# Patient Record
Sex: Male | Born: 1950
Health system: Southern US, Community
[De-identification: ages and names within clinical notes are randomized; demographics above are authoritative.]

## PROBLEM LIST (undated history)

## (undated) DIAGNOSIS — L089 Local infection of the skin and subcutaneous tissue, unspecified: Secondary | ICD-10-CM

## (undated) DIAGNOSIS — G510 Bell's palsy: Secondary | ICD-10-CM

## (undated) DIAGNOSIS — Z5320 Procedure and treatment not carried out because of patient's decision for unspecified reasons: Secondary | ICD-10-CM

## (undated) DIAGNOSIS — I10 Essential (primary) hypertension: Secondary | ICD-10-CM

## (undated) DIAGNOSIS — E669 Obesity, unspecified: Secondary | ICD-10-CM

## (undated) DIAGNOSIS — I5032 Chronic diastolic (congestive) heart failure: Secondary | ICD-10-CM

## (undated) DIAGNOSIS — E785 Hyperlipidemia, unspecified: Secondary | ICD-10-CM

## (undated) DIAGNOSIS — G4733 Obstructive sleep apnea (adult) (pediatric): Secondary | ICD-10-CM

## (undated) DIAGNOSIS — I2510 Atherosclerotic heart disease of native coronary artery without angina pectoris: Secondary | ICD-10-CM

## (undated) DIAGNOSIS — I4892 Unspecified atrial flutter: Secondary | ICD-10-CM

## (undated) DIAGNOSIS — H3412 Central retinal artery occlusion, left eye: Secondary | ICD-10-CM

## (undated) DIAGNOSIS — R319 Hematuria, unspecified: Secondary | ICD-10-CM

## (undated) DIAGNOSIS — I4819 Other persistent atrial fibrillation: Secondary | ICD-10-CM

## (undated) DIAGNOSIS — I639 Cerebral infarction, unspecified: Secondary | ICD-10-CM

## (undated) DIAGNOSIS — E291 Testicular hypofunction: Secondary | ICD-10-CM

## (undated) HISTORY — PX: COLONOSCOPY: SHX174

## (undated) HISTORY — DX: Bell's palsy: G51.0

## (undated) HISTORY — DX: Other persistent atrial fibrillation: I48.19

## (undated) HISTORY — DX: Procedure and treatment not carried out because of patient's decision for unspecified reasons: Z53.20

## (undated) HISTORY — DX: Obesity, unspecified: E66.9

## (undated) HISTORY — DX: Morbid (severe) obesity due to excess calories: E66.01

## (undated) HISTORY — DX: Hyperlipidemia, unspecified: E78.5

## (undated) HISTORY — DX: Obstructive sleep apnea (adult) (pediatric): G47.33

## (undated) HISTORY — DX: Hematuria, unspecified: R31.9

## (undated) HISTORY — DX: Unspecified atrial flutter: I48.92

## (undated) HISTORY — DX: Essential (primary) hypertension: I10

## (undated) HISTORY — DX: Cerebral infarction, unspecified: I63.9

## (undated) HISTORY — DX: Local infection of the skin and subcutaneous tissue, unspecified: L08.9

## (undated) HISTORY — DX: Testicular hypofunction: E29.1

## (undated) HISTORY — PX: CATARACT EXTRACTION: SUR2

## (undated) HISTORY — DX: Central retinal artery occlusion, left eye: H34.12

## (undated) HISTORY — DX: Atherosclerotic heart disease of native coronary artery without angina pectoris: I25.10

---

## 2006-05-16 ENCOUNTER — Ambulatory Visit: Payer: Self-pay | Admitting: Family Medicine

## 2006-05-19 ENCOUNTER — Emergency Department (HOSPITAL_COMMUNITY): Admission: EM | Admit: 2006-05-19 | Discharge: 2006-05-19 | Payer: Self-pay | Admitting: Emergency Medicine

## 2006-05-31 ENCOUNTER — Encounter (HOSPITAL_COMMUNITY): Admission: RE | Admit: 2006-05-31 | Discharge: 2006-06-05 | Payer: Self-pay | Admitting: Cardiology

## 2007-08-25 ENCOUNTER — Ambulatory Visit: Payer: Self-pay | Admitting: Family Medicine

## 2007-08-28 ENCOUNTER — Ambulatory Visit: Payer: Self-pay | Admitting: Family Medicine

## 2007-10-09 ENCOUNTER — Ambulatory Visit: Payer: Self-pay | Admitting: Family Medicine

## 2008-04-02 ENCOUNTER — Inpatient Hospital Stay (HOSPITAL_COMMUNITY): Admission: EM | Admit: 2008-04-02 | Discharge: 2008-04-09 | Payer: Self-pay | Admitting: Emergency Medicine

## 2008-04-05 ENCOUNTER — Encounter (INDEPENDENT_AMBULATORY_CARE_PROVIDER_SITE_OTHER): Payer: Self-pay | Admitting: Neurology

## 2008-05-05 ENCOUNTER — Inpatient Hospital Stay (HOSPITAL_COMMUNITY): Admission: AD | Admit: 2008-05-05 | Discharge: 2008-05-08 | Payer: Self-pay | Admitting: Interventional Cardiology

## 2008-05-14 ENCOUNTER — Inpatient Hospital Stay (HOSPITAL_COMMUNITY): Admission: AD | Admit: 2008-05-14 | Discharge: 2008-05-18 | Payer: Self-pay | Admitting: Cardiology

## 2008-06-03 ENCOUNTER — Ambulatory Visit (HOSPITAL_COMMUNITY): Admission: RE | Admit: 2008-06-03 | Discharge: 2008-06-03 | Payer: Self-pay | Admitting: Cardiology

## 2008-06-21 ENCOUNTER — Encounter (HOSPITAL_COMMUNITY): Admission: RE | Admit: 2008-06-21 | Discharge: 2008-07-20 | Payer: Self-pay | Admitting: Cardiology

## 2008-06-28 ENCOUNTER — Ambulatory Visit: Payer: Self-pay | Admitting: Family Medicine

## 2008-06-30 ENCOUNTER — Ambulatory Visit: Payer: Self-pay | Admitting: Internal Medicine

## 2008-07-26 ENCOUNTER — Ambulatory Visit (HOSPITAL_COMMUNITY): Admission: RE | Admit: 2008-07-26 | Discharge: 2008-07-26 | Payer: Self-pay | Admitting: Cardiology

## 2008-07-26 ENCOUNTER — Encounter (INDEPENDENT_AMBULATORY_CARE_PROVIDER_SITE_OTHER): Payer: Self-pay | Admitting: Neurology

## 2008-07-27 ENCOUNTER — Ambulatory Visit: Payer: Self-pay | Admitting: Internal Medicine

## 2008-07-27 ENCOUNTER — Ambulatory Visit (HOSPITAL_COMMUNITY): Admission: RE | Admit: 2008-07-27 | Discharge: 2008-07-28 | Payer: Self-pay | Admitting: Internal Medicine

## 2008-08-01 HISTORY — PX: ATRIAL ABLATION SURGERY: SHX560

## 2008-08-10 ENCOUNTER — Ambulatory Visit: Payer: Self-pay | Admitting: Family Medicine

## 2008-08-16 ENCOUNTER — Encounter: Admission: RE | Admit: 2008-08-16 | Discharge: 2008-08-16 | Payer: Self-pay | Admitting: Family Medicine

## 2008-08-19 ENCOUNTER — Ambulatory Visit: Payer: Self-pay | Admitting: Internal Medicine

## 2008-08-20 ENCOUNTER — Ambulatory Visit: Payer: Self-pay | Admitting: Internal Medicine

## 2008-08-24 ENCOUNTER — Ambulatory Visit (HOSPITAL_COMMUNITY): Admission: RE | Admit: 2008-08-24 | Discharge: 2008-08-24 | Payer: Self-pay | Admitting: Cardiology

## 2008-08-31 ENCOUNTER — Ambulatory Visit: Payer: Self-pay | Admitting: Internal Medicine

## 2008-09-06 DIAGNOSIS — I4819 Other persistent atrial fibrillation: Secondary | ICD-10-CM | POA: Insufficient documentation

## 2008-10-20 ENCOUNTER — Ambulatory Visit: Payer: Self-pay | Admitting: Internal Medicine

## 2008-10-20 ENCOUNTER — Encounter: Payer: Self-pay | Admitting: Internal Medicine

## 2008-10-20 DIAGNOSIS — R5383 Other fatigue: Secondary | ICD-10-CM

## 2008-10-20 DIAGNOSIS — R5381 Other malaise: Secondary | ICD-10-CM | POA: Insufficient documentation

## 2008-10-20 LAB — CONVERTED CEMR LAB
BUN: 12 mg/dL
CO2: 29 meq/L
Calcium: 8.9 mg/dL
Chloride: 105 meq/L
Creatinine, Ser: 0.7 mg/dL
GFR calc non Af Amer: 123.06 mL/min
Glucose, Bld: 91 mg/dL
Potassium: 4.2 meq/L
Sodium: 141 meq/L

## 2008-11-15 ENCOUNTER — Ambulatory Visit: Payer: Self-pay | Admitting: Family Medicine

## 2008-11-15 ENCOUNTER — Telehealth: Payer: Self-pay | Admitting: Internal Medicine

## 2008-11-22 ENCOUNTER — Emergency Department (HOSPITAL_COMMUNITY): Admission: EM | Admit: 2008-11-22 | Discharge: 2008-11-22 | Payer: Self-pay | Admitting: Emergency Medicine

## 2008-11-25 ENCOUNTER — Emergency Department (HOSPITAL_COMMUNITY): Admission: EM | Admit: 2008-11-25 | Discharge: 2008-11-25 | Payer: Self-pay | Admitting: Emergency Medicine

## 2008-11-29 ENCOUNTER — Ambulatory Visit: Payer: Self-pay | Admitting: Family Medicine

## 2008-12-08 ENCOUNTER — Ambulatory Visit: Payer: Self-pay | Admitting: Family Medicine

## 2008-12-23 ENCOUNTER — Encounter: Payer: Self-pay | Admitting: Internal Medicine

## 2008-12-23 ENCOUNTER — Ambulatory Visit: Payer: Self-pay | Admitting: Family Medicine

## 2009-01-10 ENCOUNTER — Ambulatory Visit: Payer: Self-pay | Admitting: Internal Medicine

## 2009-01-12 ENCOUNTER — Ambulatory Visit: Payer: Self-pay | Admitting: Family Medicine

## 2009-02-22 LAB — HM COLONOSCOPY

## 2009-02-24 ENCOUNTER — Ambulatory Visit: Payer: Self-pay | Admitting: Family Medicine

## 2009-02-24 ENCOUNTER — Encounter: Payer: Self-pay | Admitting: Internal Medicine

## 2009-04-20 ENCOUNTER — Ambulatory Visit: Payer: Self-pay | Admitting: Internal Medicine

## 2009-04-20 DIAGNOSIS — N529 Male erectile dysfunction, unspecified: Secondary | ICD-10-CM | POA: Insufficient documentation

## 2009-06-06 ENCOUNTER — Ambulatory Visit: Payer: Self-pay | Admitting: Family Medicine

## 2009-07-23 DIAGNOSIS — I2510 Atherosclerotic heart disease of native coronary artery without angina pectoris: Secondary | ICD-10-CM | POA: Insufficient documentation

## 2009-07-28 ENCOUNTER — Encounter (HOSPITAL_COMMUNITY): Admission: RE | Admit: 2009-07-28 | Discharge: 2009-09-26 | Payer: Self-pay | Admitting: Cardiology

## 2009-08-12 ENCOUNTER — Ambulatory Visit (HOSPITAL_COMMUNITY): Admission: RE | Admit: 2009-08-12 | Discharge: 2009-08-12 | Payer: Self-pay | Admitting: Cardiology

## 2009-08-15 ENCOUNTER — Ambulatory Visit (HOSPITAL_COMMUNITY): Admission: RE | Admit: 2009-08-15 | Discharge: 2009-08-16 | Payer: Self-pay | Admitting: Interventional Cardiology

## 2009-08-25 ENCOUNTER — Ambulatory Visit: Payer: Self-pay | Admitting: Family Medicine

## 2009-10-10 ENCOUNTER — Ambulatory Visit: Payer: Self-pay | Admitting: Physician Assistant

## 2009-10-12 ENCOUNTER — Ambulatory Visit: Payer: Self-pay | Admitting: Internal Medicine

## 2009-11-25 ENCOUNTER — Ambulatory Visit: Payer: Self-pay | Admitting: Family Medicine

## 2010-01-26 ENCOUNTER — Encounter: Payer: Self-pay | Admitting: Internal Medicine

## 2010-01-27 ENCOUNTER — Encounter: Payer: Self-pay | Admitting: Internal Medicine

## 2010-01-27 ENCOUNTER — Encounter: Admission: RE | Admit: 2010-01-27 | Discharge: 2010-01-27 | Payer: Self-pay | Admitting: Family Medicine

## 2010-01-27 ENCOUNTER — Ambulatory Visit: Payer: Self-pay | Admitting: Family Medicine

## 2010-02-01 ENCOUNTER — Ambulatory Visit: Payer: Self-pay | Admitting: Internal Medicine

## 2010-02-02 ENCOUNTER — Ambulatory Visit: Payer: Self-pay | Admitting: Physician Assistant

## 2010-02-08 ENCOUNTER — Encounter: Admission: RE | Admit: 2010-02-08 | Discharge: 2010-02-08 | Payer: Self-pay | Admitting: Family Medicine

## 2010-03-28 ENCOUNTER — Telehealth (INDEPENDENT_AMBULATORY_CARE_PROVIDER_SITE_OTHER): Payer: Self-pay | Admitting: *Deleted

## 2010-03-29 ENCOUNTER — Telehealth: Payer: Self-pay | Admitting: Internal Medicine

## 2010-04-24 ENCOUNTER — Ambulatory Visit: Payer: Self-pay | Admitting: Internal Medicine

## 2010-08-22 NOTE — Miscellaneous (Signed)
Summary: New Rx--Viagra 50mg   Clinical Lists Changes  Medications: Added new medication of VIAGRA 50 MG TABS (SILDENAFIL CITRATE) one by mouth as directed one hour prior to intercourse - Signed Rx of VIAGRA 50 MG TABS (SILDENAFIL CITRATE) one by mouth as directed one hour prior to intercourse;  #5 x 3;  Signed;  Entered by: Dennis Bast, RN, BSN;  Authorized by: Hillis Range, MD;  Method used: Electronically to [facility] [address], [place], [place], [place]  [address], Ph: [phone], Fax: [phone]    Prescriptions: VIAGRA 50 MG TABS (SILDENAFIL CITRATE) one by mouth as directed one hour prior to intercourse  #5 x 3   Entered by:   Dennis Bast, RN, BSN   Authorized by:   Hillis Range, MD   Signed by:   Dennis Bast, RN, BSN on 04/20/2009   Method used:   Electronically to        [facility]  [address] (retail)       [address]       [place], [place]  [address]       Ph: [phone]       Fax: [phone]   RxID:   [id number]

## 2010-08-22 NOTE — Assessment & Plan Note (Signed)
Summary: per checko ut/sf   Referring Provider:  Eliott Nine, MD Primary Provider:  Nicholes Calamity   History of Present Illness: The patient presents today for routine electrophysiology followup. He reports doing very well since last being seen in our clinic.  He is unaware of any symptoms of afib.  The patient denies palpitations, orthopnea, PND, lower extremity edema, dizziness, presyncope, syncope, or neurologic sequela.  He reports dypsnea with moderate activities.  He reports rare atypical chest pain at rest.  He underwent [facility] by Dr Mayford Knife which revealed a chronically occluded RCA with L->R collaterals.  This could not be angioplastied and he is therefore managed medically.  The patient is tolerating medications without difficulties and is otherwise without complaint today.   Current Medications (verified): 1)  Metoprolol Tartrate 50 Mg Tabs (Metoprolol Tartrate) .... Take One Tablet By Mouth Twice A Day 2)  Omeprazole 20 Mg Cpdr (Omeprazole) .... Two Times A Day 3)  Warfarin Sodium 5 Mg Tabs (Warfarin Sodium) .[name].. 1 By Mouth Once Daily 4)  Valtrex 500 Mg Tabs (Valacyclovir Hcl) .[name].. 1 By Mouth Once Daily 5)  Sotalol Hcl 160 Mg Tabs (Sotalol Hcl) .... Take One Tablet By Mouth Twice A Day 6)  Diovan 160 Mg Tabs (Valsartan) .... Take One Tablet By Mouth Daily 7)  Vit D 50,000 .... One Tab Once Daily 8)  Diovan 160 Mg Tabs (Valsartan) .... Take One Tablet By Mouth Daily 9)  Isosorbide Mononitrate Cr 30 Mg Xr24h-Tab (Isosorbide Mononitrate) .... Take One Tablet By Mouth Daily  Allergies (verified): No Known Drug Allergies  Past History:  Past Medical History:  1. Persistent atrial fibrillation and typical appearing atrial flutter.       s.p EPS and RFA for atrial fibrillation with CTI ablatation performed 06/26/08  2. Bilateral cerebrovascular accident secondary to cardioembolic event September 2009.   3. Left central retinal artery occlusion, diagnosed in 2008.   4. Hypertension.   5.  Hyperlipidemia.   6. Morbid obesity.   7. Obstructive sleep apnea compliant with CPAP.   8. Status post prior known kidney surgery at age 40.   49. Chronic left leg herpetic skin infection.   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 The patient lives in [place], [place], with his spouse and 81-1/2-year-old son.  He has a remote history of tobacco, but quit 30 years ago.  He also has a heavy history of alcohol consumption, but quit 30 years ago.  He previously used marijuana and cocaine, but denies use over the past 30 years.  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   Vital Signs:  Patient profile:   60 year old male Height:      71 inches Weight:      296 pounds BMI:     41.43 Pulse rate:   71 / minute BP sitting:   150 / 90  Vitals Entered By: Laurance Flatten CMA (October 12, 2009 10:02 AM)  Physical Exam  General:  obese, no acute distress Head:  normocephalic and atraumatic Eyes:  PERRLA/EOM intact; conjunctiva and lids normal. Mouth:  Teeth, gums and palate normal. Oral mucosa normal. Neck:  Neck supple, no JVD. No masses, thyromegaly or abnormal cervical nodes. Lungs:  Clear bilaterally to auscultation and percussion. Heart:  Non-displaced PMI, chest non-tender; regular rate and rhythm, S1, S2 without murmurs, rubs or gallops. Carotid upstroke normal, no bruit. Normal abdominal aortic size, no  bruits. Femorals normal pulses, no bruits. Pedals normal pulses. No edema, no varicosities. Abdomen:  Bowel sounds positive; abdomen soft and non-tender without masses, organomegaly, or hernias noted. No hepatosplenomegaly. Msk:  Back normal, normal gait. Muscle strength and tone normal. Pulses:  pulses normal in all 4 extremities Neurologic:  L peripheral VII nerve palsy, neuro otherwise intact Skin:  Intact without lesions or rashes. Psych:  Normal affect.   EKG  Procedure  date:  10/12/2009  Findings:      sinus rhythm 71 bpm, otherwise normal ekg  Impression & Recommendations:  Problem # 1:  ATRIAL FIBRILLATION (ICD-427.31) Stable, without symptoms of afib following ablation.  We will continue sotalol.  Continue lifelong anticoagulation with coumadin.  Consider event monitor and stopping sotalol in the future.    Problem # 2:  CAD (ICD-414.00) stable no changes today  Problem # 3:  BENIGN ESSENTIAL HYPERTENSION PREVIOUS PPC (ICD-642.04) above goal today salt restriction and lifestyle modification instructed  Other Orders: EKG w/ Interpretation (93000)  Patient Instructions: 1)  Your physician recommends that you schedule a follow-up appointment in: 6 months with Dr Johney Frame 2)  Watch salt and try and lose some weight

## 2010-08-22 NOTE — Progress Notes (Signed)
Summary: needs a new RX called in for metoprolol  Phone Note Call from Patient Call back at Work Phone [phone]   Caller: pt Reason for Call: Refill Medication Summary of Call: per pt call he is in [place] and run out of metoprolol 50 mg twice a day #30 and needs it called in to [facility] [phone]. Initial call taken by: Faythe Ghee,  March 28, 2010 8:45 AM  Follow-up for Phone Call        called into pharmacy Follow-up by: Laurance Flatten CMA,  March 29, 2010 4:59 PM

## 2010-08-22 NOTE — Assessment & Plan Note (Signed)
Summary: PER CHECK OUT/SF   Visit Type:  Follow-up Referring Yamato Kopf:  Eliott Nine, MD  CC:  afib.  History of Present Illness: The patient presents today for routine electrophysiology followup. He reports doing very well since last being seen in our clinic.  He is unaware of any symptoms of afib.  The patient denies palpitations, orthopnea, PND, lower extremity edema, dizziness, presyncope, syncope, or neurologic sequela.  He reports dypsnea with moderate activities.  He reports rare atypical chest pain at rest.  The patient is tolerating medications without difficulties and is otherwise without complaint today.   Problems Prior to Update: 1)  Fatigue  (ICD-780.79) 2)  Benign Essential Hypertension Previous Ppc  (ICD-642.04) 3)  Atrial Fibrillation  (ICD-427.31)  Current Medications (verified): 1)  Metoprolol Tartrate 50 Mg Tabs (Metoprolol Tartrate) .... Take One Tablet By Mouth Twice A Day 2)  Omeprazole 20 Mg Cpdr (Omeprazole) .... Two Times A Day 3)  Warfarin Sodium 5 Mg Tabs (Warfarin Sodium) .[name].. 1 By Mouth Once Daily 4)  Simvastatin 80 Mg Tabs (Simvastatin) .... Take One Tablet By Mouth Daily At Bedtime 5)  Valtrex 500 Mg Tabs (Valacyclovir Hcl) .[name].. 1 By Mouth Once Daily 6)  Sotalol Hcl 160 Mg Tabs (Sotalol Hcl) .... Take One Tablet By Mouth Twice A Day 7)  Diovan 160 Mg Tabs (Valsartan) .... Take One Tablet By Mouth Daily 8)  Vicodin Es 7.5-750 Mg Tabs (Hydrocodone-Acetaminophen) .[name].. 1 By Mouth Q6 As Needed 9)  Lisinopril 10 Mg Tabs (Lisinopril) .... Once Daily 10)  Vit D 50,000 .... One Tab Once Daily  Allergies (verified): No Known Drug Allergies  Past History:  Past Medical History: Reviewed history from 01/10/2009 and no changes required.  1. Persistent atrial fibrillation and typical appearing atrial flutter.       s.p EPS and RFA for atrial fibrillation with CTI ablatation performed 06/26/08  2. Bilateral cerebrovascular accident secondary to cardioembolic  event September 2009.   3. Left central retinal artery occlusion, diagnosed in 2008.   4. Hypertension.   5. Hyperlipidemia.   6. Morbid obesity.   7. Obstructive sleep apnea compliant with CPAP.   8. Status post prior known kidney surgery at age 22.   37. Chronic left leg herpetic skin infection.   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 The patient lives in [place], [place], with his spouse and 102-1/2-year-old son.  He has a remote history of tobacco, but quit 30 years ago.  He also has a heavy history of alcohol consumption, but quit 30 years ago.  He previously used marijuana and cocaine, but denies use over the past 30 years.  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   Review of Systems       + erectile dysfunction  Vital Signs:  Patient profile:   60 year old male Height:      71 inches Weight:      298 pounds Pulse rate:   78 / minute BP sitting:   116 / 66  (left arm) Cuff size:   large  Vitals Entered By: Oswald Hillock (April 20, 2009 2:00 PM)  Physical Exam  General:  obese, no acute distress Head:  normocephalic and atraumatic Eyes:  PERRLA/EOM intact; conjunctiva and lids normal. Nose:  no deformity, discharge, inflammation, or lesions Mouth:  Teeth, gums and palate normal. Oral mucosa normal. Neck:  Neck supple, no JVD. No masses, thyromegaly or abnormal cervical nodes. Lungs:  Clear bilaterally to auscultation and percussion. Heart:  Non-displaced PMI, chest non-tender; regular rate and rhythm, S1, S2 without murmurs, rubs or gallops. Carotid upstroke normal, no bruit. Normal abdominal aortic size, no bruits. Femorals normal pulses, no bruits. Pedals normal pulses. No edema, no varicosities. Abdomen:  Bowel sounds positive; abdomen soft and non-tender without masses, organomegaly, or hernias noted. No hepatosplenomegaly. Msk:  Back normal, normal gait. Muscle strength and tone  normal. Pulses:  pulses normal in all 4 extremities Extremities:  No clubbing or cyanosis. Neurologic:  L peripheral VII nerve palsy, neuro otherwise intact Skin:  Intact without lesions or rashes. Cervical Nodes:  no significant adenopathy Psych:  Normal affect. He reports occasional irritability since his prior stroke   Impression & Recommendations:  Problem # 1:  ATRIAL FIBRILLATION (ICD-427.31) Stable, without symptoms of afib following ablation.  We will continue sotalol.  We could consider event monitor followed by Sotalol cessation if no afib is found.  He is very reluctant to stop sotalol as he is doing well at this point. Continue lifelong anticoagulation with coumadin.  His updated medication list for this problem includes:    Metoprolol Tartrate 50 Mg Tabs (Metoprolol tartrate) .[name]... Take one tablet by mouth twice a day    Warfarin Sodium 5 Mg Tabs (Warfarin sodium) .[name]... 1 by mouth once daily    Sotalol Hcl 160 Mg Tabs (Sotalol hcl) .[name]... Take one tablet by mouth twice a day  Problem # 2:  FATIGUE (ICD-780.79) The patient has intermittent fatigue and SOB as well as atypical CP.  I have advised that he lose weight.  Regular exercise was also encouraged.  He has been noncompliant with this in the past.  If his SOB worsens, I have advised him to see Dr Mayford Knife for possible cath.  Problem # 3:  BENIGN ESSENTIAL HYPERTENSION PREVIOUS PPC (ICD-642.04) stable, no changes today  His updated medication list for this problem includes:    Metoprolol Tartrate 50 Mg Tabs (Metoprolol tartrate) .[name]... Take one tablet by mouth twice a day    Sotalol Hcl 160 Mg Tabs (Sotalol hcl) .[name]... Take one tablet by mouth twice a day    Diovan 160 Mg Tabs (Valsartan) .[name]... Take one tablet by mouth daily    Lisinopril 10 Mg Tabs (Lisinopril) ..... Once daily  Problem # 4:  IMPOTENCE OF ORGANIC ORIGIN (ICD-607.84) Prn viagra prescribed today.  He is advised to contact me if any problems occur with this  medicine.  Patient Instructions: 1)  Your physician recommends that you schedule a follow-up appointment in: 6 months 2)  Follow-up with Dr Mayford Knife is December as scheduled.

## 2010-08-22 NOTE — Progress Notes (Signed)
Summary: refill request  Phone Note Refill Request Message from:  Patient on March 29, 2010 1:01 PM  Refills Requested: Medication #1:  METOPROLOL TARTRATE 50 MG TABS Take one  tablet by mouth twice a day [facility] [place] [id number]   Method Requested: Telephone to Pharmacy Initial call taken by: Glynda Jaeger,  March 29, 2010 1:01 PM  Follow-up for Phone Call        called in Follow-up by: Laurance Flatten CMA,  March 31, 2010 2:26 PM    Prescriptions: METOPROLOL TARTRATE 50 MG TABS (METOPROLOL TARTRATE) Take one  tablet by mouth twice a day  #60 x 3   Entered by:   Laurance Flatten CMA   Authorized by:   Hillis Range, MD   Signed by:   Laurance Flatten CMA on 03/31/2010   Method used:   Historical   RxID:   [id number]

## 2010-08-22 NOTE — Assessment & Plan Note (Signed)
Summary: 6 MONTH ROV.SL   Visit Type:  Follow-up Referring Provider:  Eliott Nine, MD Primary Provider:  Nicholes Calamity   History of Present Illness: The patient presents today for routine electrophysiology followup. He reports doing reasonably well since last being seen in our clinic.  He developed tachypalpitations last week for which he presented to Dr Malachy Mood office.  He was felt to have afib vs atrial flutter at that time, with V rates 140s.  His metoprolol was increased to 50mg  BID at that time.  He has done well since without further symptoms of arrhythmia.  This is his first episode of breakthrough arrhythmia.  The patient denies orthopnea, PND, lower extremity edema, dizziness, presyncope, syncope, or neurologic sequela.  He previously underwent [facility] by Dr Mayford Knife which revealed a chronically occluded RCA with L->R collaterals.  This could not be angioplastied and he is therefore managed medically.  The patient is tolerating medications without difficulties and is otherwise without complaint today.   Current Medications (verified): 1)  Metoprolol Tartrate 50 Mg Tabs (Metoprolol Tartrate) .... Take One  Tablet By Mouth Twice A Day 2)  Omeprazole 20 Mg Cpdr (Omeprazole) .... Two Times A Day 3)  Warfarin Sodium 5 Mg Tabs (Warfarin Sodium) .... Uad 4)  Valtrex 500 Mg Tabs (Valacyclovir Hcl) .[name].. 1 By Mouth Once Daily 5)  Sotalol Hcl 160 Mg Tabs (Sotalol Hcl) .... Take One Tablet By Mouth Twice A Day 6)  Diovan 160 Mg Tabs (Valsartan) .... Take One Tablet By Mouth Daily 7)  Vit D 50,000 .... One Tab Weekly 8)  Isosorbide Mononitrate Cr 30 Mg Xr24h-Tab (Isosorbide Mononitrate) .... Take One Tablet By Mouth Daily 9)  Amoxicillin .... Two Times A Day  Allergies (verified): No Known Drug Allergies  Past History:  Past Medical History: Reviewed history from 10/12/2009 and no changes required.  1. Persistent atrial fibrillation and typical appearing atrial flutter.       s.p EPS and RFA for  atrial fibrillation with CTI ablatation performed 06/26/08  2. Bilateral cerebrovascular accident secondary to cardioembolic event September 2009.   3. Left central retinal artery occlusion, diagnosed in 2008.   4. Hypertension.   5. Hyperlipidemia.   6. Morbid obesity.   7. Obstructive sleep apnea compliant with CPAP.   8. Status post prior known kidney surgery at age 26.   76. Chronic left leg herpetic skin infection.   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 The patient lives in [place], [place], with his spouse and 81-1/2-year-old son.  He has a remote history of tobacco, but quit 30 years ago.  He also has a heavy history of alcohol consumption, but quit 30 years ago.  He previously used marijuana and cocaine, but denies use over the past 30 years.  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   Review of Systems       All systems are reviewed and negative except as listed in the HPI.   Vital Signs:  Patient profile:   60 year old male Height:      71 inches Weight:      302 pounds BMI:     42.27 Pulse rate:   80 / minute BP sitting:   128 / 72  (left arm)  Vitals Entered By: Laurance Flatten CMA (February 01, 2010 2:04 PM)  Physical Exam  General:  obese, no acute distress Head:  normocephalic and atraumatic Eyes:  PERRLA/EOM intact; conjunctiva and lids normal. Mouth:  Teeth, gums and palate normal. Oral mucosa normal. Neck:  Neck supple, no JVD. No masses, thyromegaly or abnormal cervical nodes. Lungs:  Clear bilaterally to auscultation and percussion. Heart:  Non-displaced PMI, chest non-tender; regular rate and rhythm, S1, S2 without murmurs, rubs or gallops. Carotid upstroke normal, no bruit. Normal abdominal aortic size, no bruits. Femorals normal pulses, no bruits. Pedals normal pulses. No edema, no varicosities. Abdomen:  Bowel sounds positive; abdomen soft and  non-tender without masses, organomegaly, or hernias noted. No hepatosplenomegaly. Msk:  Back normal, normal gait. Muscle strength and tone normal. Pulses:  pulses normal in all 4 extremities Extremities:  No clubbing or cyanosis. Neurologic:  L peripheral VII nerve palsy, neuro otherwise intact   EKG  Procedure date:  02/01/2010  Findings:      sinus rhythm 80 bpm, PR 164, Qtc 468, otherwise normal ekg  EKG  Procedure date:  01/25/2010  Findings:      atypical atrial flutter V rate 130 bpm  Impression & Recommendations:  Problem # 1:  ATRIAL FIBRILLATION (ICD-427.31) I reviewed the patient's ekg from Dr Malachy Mood office.  This demonstrates atypical atrial flutter.  I had a long discussion with the patient today.  His weight is a major issue and will likely cause further episodes of atrial arrhythmias as time goes on.  I spent a prolonged period of time talking with the patient today about weight loss and lifestyle modification.  No medicine changes are made today.  I have recommended that he continue coumadin for stroke prevention.  If he has further afib with RVR, he should take an additional metoprolol 50mg  and contact my office. I would not consider further ablation unless he loses significant weight and then has more atrial arrhythmias after weight loss.  Problem # 2:  CAD (ICD-414.00) stable no changes  Problem # 3:  BENIGN ESSENTIAL HYPERTENSION PREVIOUS PPC (ICD-642.04) at goal today  Problem # 4:  MORBID OBESITY (ICD-278.01) weight loss advised compliance with cpap encouraged  Other Orders: EKG w/ Interpretation (93000)  Patient Instructions: 1)  rYour physician recommends that you schedule a follow-up appointment in: 6 weeks with Dr Johney Frame

## 2010-08-22 NOTE — Assessment & Plan Note (Signed)
Summary: [facility]   Visit Type:  Follow-up Referring Provider:  Eliott Nine, MD  CC:  fatigue.  History of Present Illness: The patient presents today for electrophysiology followup of his atrial fibrillation and atrial flutter.  He reports doing very well since last being seen in her office. He reports symptoms of palpitations lasting only 2-3 minutes one to 2 times per week. He feels that this is a significant improvement in his symptomatic atrial arrhythmias. He does however report fatigue and decreased exercise tolerance with moderate ambulation. He reports occasional chest discomfort with exertion. He was briefly evaluated with a stress test by Dr. Mayford Knife which the patient reports was normal. These symptoms have been present since his previous stroke. He denies any recent worsening of chest pain, shortness of breath, or fatigue. He denies dysphagia, neurologic sequela, presyncope, syncope, or worsening edema.  He reports intermittent pain across the lateral aspect of his left leg which she reports is similar to prior "nerve impingement pain". He is otherwise without complaint today.  Current Medications (verified): 1)  Metoprolol Tartrate 50 Mg Tabs (Metoprolol Tartrate) .... Take One Tablet By Mouth Twice A Day 2)  Omeprazole 20 Mg Cpdr (Omeprazole) .[name].. 1 By Mouth Once Daily 3)  Warfarin Sodium 5 Mg Tabs (Warfarin Sodium) .[name].. 1 By Mouth Once Daily 4)  Simvastatin 40 Mg Tabs (Simvastatin) .... Take One Tablet By Mouth Daily At Bedtime 5)  Valtrex 500 Mg Tabs (Valacyclovir Hcl) .[name].. 1 By Mouth Once Daily 6)  Sotalol Hcl 160 Mg Tabs (Sotalol Hcl) .... Take One Tablet By Mouth Twice A Day 7)  Lisinopril 10 Mg Tabs (Lisinopril) .... Take One Tablet By Mouth Daily  Allergies (verified): No Known Drug Allergies  Past History:  Past Medical History:    Reviewed history from 09/06/2008 and no changes required:     1. Persistent atrial fibrillation and typical appearing atrial  flutter.          s.p EPS and RFA for atrial fibrillation with CTI ablatation performed 06/26/08     2. Bilateral cerebrovascular accident secondary to cardioembolic event September 2009.      3. Left central retinal artery occlusion, diagnosed in 2008.      4. Hypertension.      5. Hyperlipidemia.      6. Morbid obesity.      7. Obstructive sleep apnea compliant with CPAP.      8. Status post prior known kidney surgery at age 47.      37. Chronic left leg herpetic skin infection.   Family History:    Reviewed history from 09/06/2008 and no changes required:       The patient's father had a stroke.   Social History:    Reviewed history from 09/06/2008 and no changes required:       The patient lives in [place], [place], with his spouse and 20-1/2-year-old son.  He has a remote history of tobacco, but quit 30 years ago.  He also has a heavy history of alcohol consumption, but quit 30 years ago.  He previously used marijuana and cocaine, but denies use over the past 30 years.  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   Review of Systems       All systems are reviewed and negative except as listed in the HPI.   Vital Signs:  Patient profile:   60 year old male Height:      71 inches Weight:  295.75 pounds BMI:     41.40 Pulse rate:   66 / minute BP sitting:   138 / 80  (left arm) Cuff size:   large  Vitals Entered By: Flonnie Overman (October 20, 2008 2:21 PM)  Physical Exam  General:  obese, no acute distress Head:  normocephalic and atraumatic Eyes:  PERRLA/EOM intact; conjunctiva and lids normal. Nose:  no deformity, discharge, inflammation, or lesions Mouth:  Teeth, gums and palate normal. Oral mucosa normal. Neck:  Neck supple, no JVD. No masses, thyromegaly or abnormal cervical nodes. Lungs:  Clear bilaterally to auscultation and percussion. Heart:  Non-displaced PMI, chest non-tender; regular rate and rhythm, S1, S2 without murmurs, rubs or  gallops. Carotid upstroke normal, no bruit. Normal abdominal aortic size, no bruits. Femorals normal pulses, no bruits. Pedals normal pulses. No edema, no varicosities. Abdomen:  Bowel sounds positive; abdomen soft and non-tender without masses, organomegaly, or hernias noted. No hepatosplenomegaly. Msk:  Back normal, normal gait. Muscle strength and tone normal. Pulses:  pulses normal in all 4 extremities Extremities:  No clubbing or cyanosis. Neurologic:  Alert and oriented x 3. cranial nerves II through XII are intact, strength and sensation are intact Skin:  Intact without lesions or rashes. Cervical Nodes:  no significant adenopathy Psych:  Normal affect. He reports occasional irritability since his prior stroke   EKG  Procedure date:  10/20/2008  Findings:      Normal sinus rhythm at 66 beats per minute, left axis deviation, nonspecific ST/T-wave changes  Impression & Recommendations:  Problem # 1:  ATRIAL FIBRILLATION (ICD-427.31) Mr. Mo appears to be doing quite well following his atrial fibrillation ablation. He has had a significant reduction in symptomatic atrial arrhythmias. He presently reports palpitations lasting only several minutes on a 2 times per week. He presents to the office in sinus rhythm today. He continues to be compliant with Coumadin without difficulties. Will continue the patient on his sotalol at this time.  Problem # 2:  BENIGN ESSENTIAL HYPERTENSION PREVIOUS PPC (ICD-642.04) I have increased the patient's lisinopril to 20 mg today for treatment of blood pressure which is presently above goal. We will obtain a basic metabolic profile today to evaluate his renal function and potassium.  Problem # 3:  FATIGUE (ICD-780.79) The patient has symptoms of fatigue, decreased exercise capacity and shortness of breath which have been present since his stroke. The symptoms are present even during sinus rhythm. I think that her initial approach should be to reduce his  beta blocker which may be limiting his ability to maintain an adequate heart rate with activities. I have therefore decreased metoprolol to 25 mg twice daily today. If his symptoms do not improve we should consider left heart catheterization which will be coordinated through Dr. Norris Cross office.  Patient Instructions: 1)  The patient will followup with me in 3 months. He will continue regular followup with Dr. Mayford Knife. He is instructed to contact my office should any problems arise in the interim.

## 2010-08-22 NOTE — Assessment & Plan Note (Signed)
Summary: PER CHECK OUT/SF   Visit Type:        Follow-up Referring Provider:  Eliott Nine, MD Primary Provider:  Nicholes Calamity   History of Present Illness: The patient presents today for routine electrophysiology followup. He reports doing reasonably well since last being seen in our clinic. He has done well without further symptoms of arrhythmia.  The patient denies recent chest pain, SOB orthopnea, PND, lower extremity edema, dizziness, presyncope, syncope, or neurologic sequela.   He reports dypsnea with moderate activity.  The patient is tolerating medications without difficulties and is otherwise without complaint today.   Current Medications (verified): 1)  Metoprolol Tartrate 50 Mg Tabs (Metoprolol Tartrate) .... Take One  Tablet By Mouth Twice A Day 2)  Warfarin Sodium 5 Mg Tabs (Warfarin Sodium) .... Uad 3)  Valtrex 500 Mg Tabs (Valacyclovir Hcl) .[name].. 1 By Mouth Once Daily 4)  Sotalol Hcl 160 Mg Tabs (Sotalol Hcl) .... Take One Tablet By Mouth Twice A Day 5)  Diovan 160 Mg Tabs (Valsartan) .... Take One Tablet By Mouth Daily 6)  Vit D 50,000 .... One Tab Weekly 7)  Isosorbide Mononitrate Cr 30 Mg Xr24h-Tab (Isosorbide Mononitrate) .... Take One Tablet By Mouth Daily 8)  Aspirin 81 Mg Tbec (Aspirin) .... Take One Tablet By Mouth Daily  Allergies (verified): No Known Drug Allergies  Past History:  Past Medical History: Reviewed history from 10/12/2009 and no changes required.  1. Persistent atrial fibrillation and typical appearing atrial flutter.       s.p EPS and RFA for atrial fibrillation with CTI ablatation performed 06/26/08  2. Bilateral cerebrovascular accident secondary to cardioembolic event September 2009.   3. Left central retinal artery occlusion, diagnosed in 2008.   4. Hypertension.   5. Hyperlipidemia.   6. Morbid obesity.   7. Obstructive sleep apnea compliant with CPAP.   8. Status post prior known kidney surgery at age 47.   62. Chronic left leg herpetic  skin infection.   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 The patient lives in [place], [place], with his spouse and 16-1/2-year-old son.  He has a remote history of tobacco, but quit 30 years ago.  He also has a heavy history of alcohol consumption, but quit 30 years ago.  He previously used marijuana and cocaine, but denies use over the past 30 years.  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   Vital Signs:  Patient profile:   60 year old male Height:      71 inches Weight:      301 pounds BMI:     42.13 Pulse rate:   68 / minute BP sitting:   120 / 80  (left arm)  Vitals Entered By: Laurance Flatten CMA (April 24, 2010 12:21 PM)  Physical Exam  General:  obese, no acute distress Head:  normocephalic and atraumatic Eyes:  PERRLA/EOM intact; conjunctiva and lids normal. Mouth:  Teeth, gums and palate normal. Oral mucosa normal. Neck:  Neck supple, no JVD. No masses, thyromegaly or abnormal cervical nodes. Lungs:  Clear bilaterally to auscultation and percussion. Heart:  Non-displaced PMI, chest non-tender; regular rate and rhythm, S1, S2 without murmurs, rubs or gallops. Carotid upstroke normal, no bruit. Normal abdominal aortic size, no bruits. Femorals normal pulses, no bruits. Pedals normal pulses. No edema, no varicosities. Abdomen:  Bowel sounds positive; abdomen soft and non-tender without masses, organomegaly, or hernias  noted. No hepatosplenomegaly. Msk:  Back normal, normal gait. Muscle strength and tone normal. Pulses:  pulses normal in all 4 extremities Extremities:  No clubbing or cyanosis. Neurologic:  L peripheral VII nerve palsy, neuro otherwise intact Skin:  Intact without lesions or rashes. Psych:  Normal affect.   EKG  Procedure date:  04/24/2010  Findings:      sinus rhythnm 68 bpm, PR 168, Qtc 472, otherwise normal ekg  Impression &  Recommendations:  Problem # 1:  ATRIAL FIBRILLATION (ICD-427.31) doing well without recent episodes continue sotalol and coumadin  Problem # 2:  MORBID OBESITY (ICD-278.01) we again discussed weight loss at length though he reports trying to modify his diet, he has had no significant reduction in weight since last visit  Problem # 3:  CAD (ICD-414.00) stable without symptoms of ischemia no changes  Problem # 4:  IMPOTENCE OF ORGANIC ORIGIN (ICD-607.84) the patient is on long acting nitrates and is no longer a candidate for viagra.  I made it very clear to the patient and his spouse that he should not use viagra.  I have recommended that he follow-up with his Urologist (whom he has seen for nephrolithiasis) to consider penile pump, prosthesis, etc.  Problem # 5:  BENIGN ESSENTIAL HYPERTENSION PREVIOUS PPC (ICD-642.04) stable  Patient Instructions: 1)  Your physician wants you to follow-up in:  9 months with DrAllred You will receive a reminder letter in the mail two months in advance. If you don't receive a letter, please call our office to schedule the follow-up appointment.

## 2010-08-22 NOTE — Assessment & Plan Note (Signed)
Summary: 3 month rov/sl   Visit Type:  Follow-up Referring Provider:  Eliott Nine, MD  CC:  sob.  History of Present Illness: [name] presents today for routine EP follow-up.  He reports doing well since last being seen in our clinic.  He is pleased with improvements in frequency/ duration of atrial fibrillation/ atrial flutter.  He reports only short "flutters" lasting several seconds.  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  He is unaware of any other sustained episodes of tachycardia.  He reports occasional dypsnea with moderate activities with rare chest discomfort.  He was also recently diagnosed with Bell's Palsy and has been treated with steroids with little improvement.  He is otherwise without complaint today.  Current Medications (verified): 1)  Metoprolol Tartrate 50 Mg Tabs (Metoprolol Tartrate) .... Take One Tablet By Mouth Twice A Day 2)  Omeprazole 20 Mg Cpdr (Omeprazole) .[name].. 1 By Mouth Once Daily 3)  Warfarin Sodium 5 Mg Tabs (Warfarin Sodium) .[name].. 1 By Mouth Once Daily 4)  Simvastatin 80 Mg Tabs (Simvastatin) .... Take One Tablet By Mouth Daily At Bedtime 5)  Valtrex 500 Mg Tabs (Valacyclovir Hcl) .[name].. 1 By Mouth Once Daily 6)  Sotalol Hcl 160 Mg Tabs (Sotalol Hcl) .... Take One Tablet By Mouth Twice A Day 7)  Diovan 160 Mg Tabs (Valsartan) .... Take One Tablet By Mouth Daily 8)  Vicodin Es 7.5-750 Mg Tabs (Hydrocodone-Acetaminophen) .[name].. 1 By Mouth Q6 As Needed  Allergies (verified): No Known Drug Allergies  Past History:  Past Medical History:  1. Persistent atrial fibrillation and typical appearing atrial flutter.       s.p EPS and RFA for atrial fibrillation with CTI ablatation performed 06/26/08  2. Bilateral cerebrovascular accident secondary to cardioembolic event September 2009.   3. Left central retinal artery occlusion, diagnosed in 2008.   4. Hypertension.   5. Hyperlipidemia.   6. Morbid obesity.   7. Obstructive sleep apnea compliant with CPAP.   8. Status post prior known kidney surgery at age 38.   15. Chronic left leg herpetic skin infection.   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 The patient lives in [place], [place], with his spouse and 51-1/2-year-old son.  He has a remote history of tobacco, but quit 30 years ago.  He also has a heavy history of alcohol consumption, but quit 30 years ago.  He previously used marijuana and cocaine, but denies use over the past 30 years.  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   Review of Systems       All systems are reviewed and negative except as listed in the HPI.   Vital Signs:  Patient profile:   60 year old male Height:      71 inches Weight:      296 pounds BMI:     41.43 Pulse rate:   75 / minute Pulse rhythm:   regular BP sitting:   130 / 80  (left arm) Cuff size:   large  Vitals Entered By: Flonnie Overman (January 10, 2009 2:01 PM)  Physical Exam  General:  obese, no acute distress Head:  normocephalic and atraumatic Eyes:  PERRLA/EOM intact; conjunctiva and lids normal. Nose:  no deformity, discharge, inflammation, or lesions Mouth:  Teeth, gums and palate normal. Oral mucosa normal. Neck:  Neck supple, no JVD.  No masses, thyromegaly or abnormal cervical nodes. Lungs:  Clear bilaterally to auscultation and percussion. Heart:  Non-displaced PMI, chest non-tender; regular rate and rhythm, S1, S2 without murmurs, rubs or gallops. Carotid upstroke normal, no bruit. Normal abdominal aortic size, no bruits. Femorals normal pulses, no bruits. Pedals normal pulses. No edema, no varicosities. Abdomen:  Bowel sounds positive; abdomen soft and non-tender without masses, organomegaly, or hernias noted. No hepatosplenomegaly. Msk:  Back normal, normal gait. Muscle strength and tone normal. Pulses:  pulses normal  in all 4 extremities Extremities:  No clubbing or cyanosis. Neurologic:  L peripheral VII nerve palsy, neuro otherwise intact Skin:  Intact without lesions or rashes. Cervical Nodes:  no significant adenopathy Psych:  Normal affect. He reports occasional irritability since his prior stroke   Impression & Recommendations:  Problem # 1:  ATRIAL FIBRILLATION (ICD-427.31) The patients atrial arrhythmias appear stable.  He has had minimal symptoms of afib/ atrial flutter since his ablation.  Unfortunately, he is noncompliant with lifestyle changes including weight reduction, exercise, and stress reduction.  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  Given his stressful lifestyle, I think that we should continue Sotalol for now.  We will consider stopping the medicine in the future.  The importance of compliance with coumadin and INR checks was again stressed today.  His updated medication list for this problem includes:    Metoprolol Tartrate 50 Mg Tabs (Metoprolol tartrate) .[name]... Take one tablet by mouth twice a day    Warfarin Sodium 5 Mg Tabs (Warfarin sodium) .[name]... 1 by mouth once daily    Sotalol Hcl 160 Mg Tabs (Sotalol hcl) .[name]... Take one tablet by mouth twice a day  Problem # 2:  BENIGN ESSENTIAL HYPERTENSION PREVIOUS PPC (ICD-642.04) stable, no changes today His updated medication list for this problem includes:    Metoprolol Tartrate 50 Mg Tabs (Metoprolol tartrate) .[name]... Take one tablet by mouth twice a day    Sotalol Hcl 160 Mg Tabs (Sotalol hcl) .[name]... Take one tablet by mouth twice a day    Diovan 160 Mg Tabs (Valsartan) .[name]... Take one tablet by mouth daily  Problem # 3:  FATIGUE (ICD-780.79) The patients shortness of breath and fatigue with rare chest pain are likely due to stress and deconditioning w/ morbid obesity.  He reports that a workup is ongoing with Dr Mayford Knife.  He is instructed to report to the ER for sustained  chest pain.  Patient Instructions: 1)  Follow-up with Dr Mayford Knife for chest pain.  Return in 4 months for further EP assessment.

## 2010-08-22 NOTE — Consult Note (Signed)
Summary: [facility]  [facility]   Imported By: Marylou Mccoy 05/31/2009 13:50:02  _____________________________________________________________________  External Attachment:    Type:   Image     Comment:   External Document

## 2010-08-22 NOTE — Letter (Signed)
Summary: [facility] Visit Note   [facility] Visit Note   Imported By: Roderic Ovens 03/03/2010 16:16:18  _____________________________________________________________________  External Attachment:    Type:   Image     Comment:   External Document

## 2010-08-22 NOTE — Progress Notes (Signed)
Summary: MED QUESTION  Medications Added DIOVAN 160 MG TABS (VALSARTAN) Take one tablet by mouth daily       Phone Note Call from Patient Call back at Home Phone [phone]   Caller: WIFE Reason for Call: Talk to Nurse Summary of Call: PT IS TAKING UP LISINOPRIL AND HAS CONCERNS ABOUT IT, PT IS COUGHING, PLS CALL WIFE Initial call taken by: Sydell Axon,  November 15, 2008 9:43 AM  Follow-up for Phone Call        Patient has a dry cough and wants to know if there is anything else he can take other than Lisinopril. Will send to Dr. Johney Frame to have him address. Dennis Bast, RN, BSN  November 15, 2008 10:07 AM    Additional Follow-up for Phone Call Additional follow up Details #1::        Stop Lisinopril and start Diovan 160mg  daily. Additional Follow-up by: Hillis Range, MD,  November 17, 2008 9:28 AM    New/Updated Medications: DIOVAN 160 MG TABS (VALSARTAN) Take one tablet by mouth daily   Prescriptions: DIOVAN 160 MG TABS (VALSARTAN) Take one tablet by mouth daily  #30 x 11   Entered by:   Dennis Bast, RN, BSN   Authorized by:   Hillis Range, MD   Signed by:   Dennis Bast, RN, BSN on 11/17/2008   Method used:   Electronically to        [facility]  [address] (retail)       [address]       [place], [place]  [address]       Ph: [phone]       Fax: [phone]   RxID:   [id number]

## 2010-10-08 LAB — BASIC METABOLIC PANEL
BUN: 12 mg/dL (ref 6–23)
BUN: 8 mg/dL (ref 6–23)
BUN: 9 mg/dL (ref 6–23)
CO2: 23 mEq/L (ref 19–32)
CO2: 24 mEq/L (ref 19–32)
CO2: 24 mEq/L (ref 19–32)
Calcium: 8.3 mg/dL — ABNORMAL LOW (ref 8.4–10.5)
Calcium: 8.3 mg/dL — ABNORMAL LOW (ref 8.4–10.5)
Calcium: 8.9 mg/dL (ref 8.4–10.5)
Chloride: 102 mEq/L (ref 96–112)
Chloride: 104 mEq/L (ref 96–112)
Chloride: 105 mEq/L (ref 96–112)
Creatinine, Ser: 0.68 mg/dL (ref 0.4–1.5)
Creatinine, Ser: 0.72 mg/dL (ref 0.4–1.5)
Creatinine, Ser: 0.8 mg/dL (ref 0.4–1.5)
GFR calc Af Amer: >60 mL/min (ref >60)
GFR calc Af Amer: >60 mL/min (ref >60)
GFR calc Af Amer: >60 mL/min (ref >60)
GFR calc non Af Amer: >60 mL/min (ref >60)
GFR calc non Af Amer: >60 mL/min (ref >60)
GFR calc non Af Amer: >60 mL/min (ref >60)
Glucose, Bld: 125 mg/dL — ABNORMAL HIGH (ref 70–99)
Glucose, Bld: 145 mg/dL — ABNORMAL HIGH (ref 70–99)
Glucose, Bld: 145 mg/dL — ABNORMAL HIGH (ref 70–99)
Potassium: 3.5 mEq/L (ref 3.5–5.1)
Potassium: 3.9 mEq/L (ref 3.5–5.1)
Potassium: 4.6 mEq/L (ref 3.5–5.1)
Sodium: 136 mEq/L (ref 135–145)
Sodium: 136 mEq/L (ref 135–145)
Sodium: 137 mEq/L (ref 135–145)

## 2010-10-08 LAB — URINALYSIS, ROUTINE W REFLEX MICROSCOPIC
Bilirubin Urine: NEGATIVE
Glucose, UA: NEGATIVE mg/dL
Ketones, ur: NEGATIVE mg/dL
Nitrite: NEGATIVE
Protein, ur: NEGATIVE mg/dL
Specific Gravity, Urine: 1.019 (ref 1.005–1.030)
Urobilinogen, UA: 0.2 mg/dL (ref 0.0–1.0)
pH: 5.5 (ref 5.0–8.0)

## 2010-10-08 LAB — CBC
HCT: 35.8 % — ABNORMAL LOW (ref 39.0–52.0)
HCT: 39.2 % (ref 39.0–52.0)
Hemoglobin: 12.5 g/dL — ABNORMAL LOW (ref 13.0–17.0)
Hemoglobin: 13.4 g/dL (ref 13.0–17.0)
MCHC: 34 g/dL (ref 30.0–36.0)
MCHC: 35 g/dL (ref 30.0–36.0)
MCV: 90.8 fL (ref 78.0–100.0)
MCV: 91.5 fL (ref 78.0–100.0)
Platelets: 164 10*3/uL (ref 150–400)
Platelets: 185 10*3/uL (ref 150–400)
RBC: 3.92 MIL/uL — ABNORMAL LOW (ref 4.22–5.81)
RBC: 4.32 MIL/uL (ref 4.22–5.81)
RDW: 13.7 % (ref 11.5–15.5)
RDW: 14.1 % (ref 11.5–15.5)
WBC: 6.7 10*3/uL (ref 4.0–10.5)
WBC: 9.3 10*3/uL (ref 4.0–10.5)

## 2010-10-08 LAB — URINE MICROSCOPIC-ADD ON

## 2010-10-08 LAB — PROTIME-INR
INR: 1.04 (ref 0.00–1.49)
INR: 1.05 (ref 0.00–1.49)
INR: 1.09 (ref 0.00–1.49)
Prothrombin Time: 13.5 seconds (ref 11.6–15.2)
Prothrombin Time: 13.6 seconds (ref 11.6–15.2)
Prothrombin Time: 14 seconds (ref 11.6–15.2)

## 2010-10-19 ENCOUNTER — Other Ambulatory Visit: Payer: Self-pay | Admitting: Internal Medicine

## 2010-10-19 ENCOUNTER — Other Ambulatory Visit: Payer: Self-pay | Admitting: *Deleted

## 2010-10-19 MED ORDER — VALSARTAN 160 MG PO TABS: 160.0000 mg | ORAL_TABLET | Freq: Every day | ORAL | Status: DC

## 2010-10-24 ENCOUNTER — Other Ambulatory Visit: Payer: Self-pay | Admitting: Internal Medicine

## 2010-10-31 LAB — CBC
HCT: 41.2 % (ref 39.0–52.0)
Hemoglobin: 14.3 g/dL (ref 13.0–17.0)
MCHC: 34.7 g/dL (ref 30.0–36.0)
MCV: 88.3 fL (ref 78.0–100.0)
Platelets: 210 10*3/uL (ref 150–400)
RBC: 4.66 MIL/uL (ref 4.22–5.81)
RDW: 14.6 % (ref 11.5–15.5)
WBC: 7.8 10*3/uL (ref 4.0–10.5)

## 2010-10-31 LAB — URINALYSIS, ROUTINE W REFLEX MICROSCOPIC
Bilirubin Urine: NEGATIVE
Glucose, UA: >1000 mg/dL — AB
Ketones, ur: 15 mg/dL — AB
Nitrite: NEGATIVE
Protein, ur: 100 mg/dL — AB
Specific Gravity, Urine: 1.034 — ABNORMAL HIGH (ref 1.005–1.030)
Urobilinogen, UA: 1 mg/dL (ref 0.0–1.0)
pH: 5.5 (ref 5.0–8.0)

## 2010-10-31 LAB — POCT I-STAT, CHEM 8
BUN: 14 mg/dL (ref 6–23)
Calcium, Ion: 1.21 mmol/L (ref 1.12–1.32)
Chloride: 107 mEq/L (ref 96–112)
Creatinine, Ser: 0.7 mg/dL (ref 0.4–1.5)
Glucose, Bld: 206 mg/dL — ABNORMAL HIGH (ref 70–99)
HCT: 44 % (ref 39.0–52.0)
Hemoglobin: 15 g/dL (ref 13.0–17.0)
Potassium: 4.1 mEq/L (ref 3.5–5.1)
Sodium: 139 mEq/L (ref 135–145)
TCO2: 23 mmol/L (ref 0–100)

## 2010-10-31 LAB — BASIC METABOLIC PANEL
BUN: 13 mg/dL (ref 6–23)
CO2: 28 mEq/L (ref 19–32)
Calcium: 9.2 mg/dL (ref 8.4–10.5)
Chloride: 106 mEq/L (ref 96–112)
Creatinine, Ser: 0.83 mg/dL (ref 0.4–1.5)
GFR calc Af Amer: >60 mL/min (ref >60)
GFR calc non Af Amer: >60 mL/min (ref >60)
Glucose, Bld: 133 mg/dL — ABNORMAL HIGH (ref 70–99)
Potassium: 4.2 mEq/L (ref 3.5–5.1)
Sodium: 141 mEq/L (ref 135–145)

## 2010-10-31 LAB — DIFFERENTIAL
Basophils Absolute: 0.1 10*3/uL (ref 0.0–0.1)
Basophils Relative: 1 % (ref 0–1)
Eosinophils Absolute: 0.4 10*3/uL (ref 0.0–0.7)
Eosinophils Relative: 6 % — ABNORMAL HIGH (ref 0–5)
Lymphocytes Relative: 44 % (ref 12–46)
Lymphs Abs: 3.4 10*3/uL (ref 0.7–4.0)
Monocytes Absolute: 1 10*3/uL (ref 0.1–1.0)
Monocytes Relative: 12 % (ref 3–12)
Neutro Abs: 2.9 10*3/uL (ref 1.7–7.7)
Neutrophils Relative %: 37 % — ABNORMAL LOW (ref 43–77)

## 2010-10-31 LAB — URINE MICROSCOPIC-ADD ON

## 2010-10-31 LAB — PROTIME-INR
INR: 2.8 — ABNORMAL HIGH (ref 0.00–1.49)
INR: 3.2 — ABNORMAL HIGH (ref 0.00–1.49)
Prothrombin Time: 31.6 seconds — ABNORMAL HIGH (ref 11.6–15.2)
Prothrombin Time: 35.6 seconds — ABNORMAL HIGH (ref 11.6–15.2)

## 2010-11-06 LAB — PROTIME-INR
INR: 2.3 — ABNORMAL HIGH (ref 0.00–1.49)
INR: 2.5 — ABNORMAL HIGH (ref 0.00–1.49)
Prothrombin Time: 26.9 seconds — ABNORMAL HIGH (ref 11.6–15.2)
Prothrombin Time: 28.4 seconds — ABNORMAL HIGH (ref 11.6–15.2)

## 2010-11-10 ENCOUNTER — Ambulatory Visit: Payer: Self-pay | Admitting: Family Medicine

## 2010-11-14 ENCOUNTER — Ambulatory Visit (INDEPENDENT_AMBULATORY_CARE_PROVIDER_SITE_OTHER): Payer: Medicaid Other | Admitting: *Deleted

## 2010-11-14 DIAGNOSIS — E119 Type 2 diabetes mellitus without complications: Secondary | ICD-10-CM

## 2010-11-14 DIAGNOSIS — I10 Essential (primary) hypertension: Secondary | ICD-10-CM

## 2010-11-14 DIAGNOSIS — E669 Obesity, unspecified: Secondary | ICD-10-CM

## 2010-11-14 DIAGNOSIS — E559 Vitamin D deficiency, unspecified: Secondary | ICD-10-CM

## 2010-12-01 ENCOUNTER — Telehealth: Payer: Self-pay | Admitting: Internal Medicine

## 2010-12-01 ENCOUNTER — Other Ambulatory Visit: Payer: Self-pay | Admitting: Medical

## 2010-12-01 MED ORDER — METOPROLOL TARTRATE 50 MG PO TABS: 50.0000 mg | ORAL_TABLET | Freq: Two times a day (BID) | ORAL | Status: DC

## 2010-12-01 MED ORDER — VALACYCLOVIR HCL 500 MG PO TABS: 500.0000 mg | ORAL_TABLET | Freq: Every day | ORAL | Status: DC

## 2010-12-01 NOTE — Telephone Encounter (Signed)
Needs refill for Metoprolol sent to [facility] in [place]. Pt is out.

## 2010-12-05 NOTE — Discharge Summary (Signed)
NAMEWES, LEZOTTE               ACCOUNT NO.:  [id number]   MEDICAL RECORD NO.:  [id number]          PATIENT TYPE:  INP   LOCATION:  4707                         FACILITY:  [facility]   PHYSICIAN:  Corky Crafts, MDDATE OF BIRTH:  Dec 14, 1950   DATE OF ADMISSION:  05/05/2008  DATE OF DISCHARGE:  05/08/2008                               DISCHARGE SUMMARY   DISCHARGE DIAGNOSES:  1. Atrial fibrillation, rate controlled.  2. Long-term Coumadin use.  3. History of cerebrovascular accident.  4. Hypertension.  5. Dyslipidemia.   HOSPITAL COURSE:  Mr. Geurts is a 60 year old patient of Dr. Armanda Magic  with a history of recent left CVA secondary to atrial fibrillation via  cardioembolic source.  At that point, he was anticoagulated and rate  controlled with a goal of electrocardioversion on outpatient basis.   He has not been symptomatic with his atrial fibrillation.  The wife,  however, called earlier on the day of admission concerned that his heart  rate was in the 140s, he was seen in the office and actually found to be  in the 2:1 flutter.  His RBR 138.  He has been admitted for that same  reason.   He was placed on the IV Cardizem drip.  He remained controlled from  ventricular response for just a while and then we had to add digoxin for  better control.  By May 08, 2008, his atrial fibrillation was rate  controlled and we felt we could discharge him to home when eventually we  will need to be cardioverted on the outpatient basis once his INR has  remained therapeutic.   Lab studies during his hospitalization included TSH of 1.978.  Cardiac  enzymes were negative.  Magnesium 2.0, protime 31.0.  INR 2.7.  Sodium  139, potassium 3.7, BUN 14, creatinine 0.48.  BNP 309.  Total  cholesterol 171, triglycerides 226, HDL 22, and LDL 78.   DISCHARGE MEDICATIONS:  1. Lopressor 100 mg p.o. twice a day.  2. Cardizem CD 360 mg a day.  3. Zocor 40 mg a day.  4. Coumadin, resume home  dose.  5. CPAP as utilized at night.  6. Digoxin 0.25 mg daily.   He will need dig level following his discharge.  He will need to follow  with Dr. Mayford Knife next week.  He should remain on a low sodium, heart-  healthy diet.  Increase activities slowly.      Guy Franco, P.A.      Corky Crafts, MD  Electronically Signed    LB/MEDQ  D:  06/18/2008  T:  06/18/2008  Job:  [id number]   cc:   Armanda Magic, M.D.

## 2010-12-05 NOTE — Discharge Summary (Signed)
[name], [name]               ACCOUNT NO.:  [id number]   MEDICAL RECORD NO.:  [id number]          PATIENT TYPE:  INP   LOCATION:  3729                         FACILITY:  [facility]   PHYSICIAN:  Armanda Magic, M.D.     DATE OF BIRTH:  11-15-1950   DATE OF ADMISSION:  05/14/2008  DATE OF DISCHARGE:  05/18/2008                               DISCHARGE SUMMARY   DISCHARGE DIAGNOSES:  1. Atrial fibrillation.  2. Sotalol load.  3. Long-term Coumadin use.  4. Hypertension.  5. Dyslipidemia.  6. Obesity.  7. Obstructive sleep apnea.   HOSPITAL COURSE:  [name] was brought over to [facility] on the  date of admission for an outpatient cardioversion.  He had had 4 weeks  of therapeutic INRs.  A cardioversion was attempted, but he did not  convert into normal sinus rhythm; therefore, he stayed at the hospital  and was started on sotalol.  He was loaded on sotalol for couple of days  and again followup cardioversion did not convert him back into normal  sinus rhythm.  Therefore we are increasing his sotalol, discharging him  to home while he loads on this dose for approximately 1 week.  In the  office, we will decide if we need to increase his sotalol any further or  if we just need to go ahead and reset up another cardioversion.   Lab studies during the patient's hospital stay include PT 29.1, INR 2.5,  sodium 139, potassium 3.8, BUN 14, creatinine 0.94, magnesium 2.1, TSH  1.978.  Hemoglobin 14.4, hematocrit 43.2, white count 7.2, platelets  198.  Chest x-ray, no acute thoracic findings.   DISCHARGE MEDICATIONS:  1. Zocor 40 mg a day.  2. Cardizem 360 mg a day.  3. Coumadin as prior to admission.  4. Metoprolol 50 mg one-half tablet twice a day.  5. Sotalol 120 mg 1 tablet twice a day.   The patient is to return to the [facility] and to see Dr. Mayford Knife on  May 25, 2008, at 2:15 p.m.  Remain on a low sodium, heart-healthy  diet.  Increase activity slowly.  Call for any  questions or concerns.      Guy Franco, P.A.      Armanda Magic, M.D.  Electronically Signed    LB/MEDQ  D:  05/18/2008  T:  05/18/2008  Job:  [id number]

## 2010-12-05 NOTE — Letter (Signed)
August 19, 2008    Armanda Magic, MD  [address]  [place], [place] [address]   RE:  Travis, Peterson  MRN:  [id number]  /  DOB:  [birth year]   Dear Gloris Manchester,   It was my pleasure to see [name] in the electrophysiology  followup today.  As you recall, he is a pleasant 60 year old gentleman  with symptomatic persistent atrial fibrillation and atrial flutter  status post EP study and radiofrequency ablation for atrial fibrillation  and atrial flutter on July 27, 2008.  During his ablation, he was  noted to have a large common ostium to the left superior and left  inferior pulmonary veins.  All four pulmonary veins were noted to be  quite large in size.  The left atrium was also enlarged.  He also had a  prominent and thick cava tricuspid isthmus with a coronary sinus  diverticula requiring irrigated ablation along the cava tricuspid  isthmus to achieve isthmus block.  The patient reports doing well  initially following ablation.  He notes however that approximately 1  week following ablation, he developed symptoms of pain with swallowing.  He was evaluated by his primary care physician and initiated on H2  blocker.  Since initiation of famotidine, he has had significant  improvement in his dysphasia.  Presently he reports that he has minimal  dysphasia unless he fails to take his famotidine at which time he does  note ongoing chest pain with swallowing.  He denies shortness of breath  or palpitations.  He was recently evaluated in your office and found to  have returned to an atrial arrhythmia.  He reports symptoms of worsening  fatigue.  The patient's wife notes that his energy remains suppressed.  He therefore presents today for further followup.   PROBLEM LIST:  1. Persistent atrial fibrillation and typical appearing right atrial      flutter.  2. Status post EP study and radiofrequency ablation for atrial      fibrillation and atrial flutter on July 26, 2008.  3. Morbid obesity.  4. Obstructive sleep apnea.  5. Ejection fraction of 40-50%.  6. New York Heart Association class II systolic heart failure.  7. Hypertension.  8. Hyperlipidemia.   CURRENT MEDICATIONS:  1. Digoxin 0.25 mg daily.  2. Simvastatin 40 mg daily.  3. Coumadin to maintain an INR between 2 and 3.  4. Metoprolol tartrate 50 mg b.i.d.  5. Cardizem 360 mg daily.  6. Sotalol 160 mg b.i.d.  7. Famotidine daily.   SOCIAL HISTORY:  The patient is an Chief Strategy Officer.  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  He has a remote history of tobacco and  alcohol consumption, but he quit approximately 30 years ago.   REVIEW OF SYSTEMS:  All systems are reviewed and negative except as  outlined above.   PHYSICAL EXAMINATION:  VITALS:  Blood pressure 130/100, heart rate 130,  respirations 18, weight 292 pounds.  GENERAL:  The patient is an obese gentleman in no acute distress.  He is  alert and oriented x3.  He is ambulatory without difficulty.  HEENT:  Normocephalic, atraumatic.  Sclerae clear.  Conjunctivae pink.  Oropharynx clear.  NECK:  Supple.  No thyromegaly, lymphadenopathy, or bruits.  JVP 8 cm.  LUNGS:  Clear.  HEART:  Tachycardia, regular rhythm.  No murmurs, rubs, or gallops.  GI:  Soft, nontender, nondistended.  Positive bowel sounds.  EXTREMITIES:  No clubbing, cyanosis, or edema.  NEUROLOGIC:  Cranial nerves II through XII are intact.  Strength and  sensation are intact.  SKIN:  No ecchymosis or lacerations.  MUSCULOSKELETAL:  No deformity or atrophy.   EKG reveals typical appearing atrial flutter with 2 on conduction and a  ventricular rate of 130 beats per minute.   IMPRESSION:  [name] is a pleasant 60 year old gentleman with a  history of persistent atrial fibrillation and typical appearing atrial  flutter status post recent EP study and radiofrequency ablation for his  atrial arrhythmias, July 26, 2008.  Unfortunately, he has returned to   atrial flutter and remains quite symptomatic with fatigue and decreased  exercise capacity.  His heart rate is elevated.  He remains  anticoagulated with Coumadin and has no neurologic symptoms.  I am most  concerned; however, today of the patient's recent dysphasia and chest  discomfort with swallowing.  I am encouraged that his symptoms have  improved with an H2 blocker; however, I think that we should follow him  closely for esophageal injury secondary to his recent ablation.   PLAN:  [name] is continued on Coumadin and we will plan for  cardioversion, which will be coordinated through your office in the next  couple of days.  I have spoken with Ms. Lewis today who will obtain an  INR in the morning and then proceed with cardioversion in the next 3-4  days.  As the patient has had recent chest discomfort following his  ablation, I think that we should defer any further ablation for several  months, but hopefully will be able to maintain sinus rhythm with  sotalol.  I have ordered a chest CT with contrast to evaluate for atrial  esophageal fistula or esophageal injury.  We are waiting to hear from  [facility] regarding prequalification clearance; however, we plan to  proceed with the studies as soon as the patient is cleared.  I have also  placed the patient on Nexium 40 mg daily, which I would like for him to  take for 3 months.  He will follow up with me in the next week and  contact me should any problems arise in the interim.   Thank you for the opportunity of participating in the care of [name].  I will keep you apprised of his course.    Sincerely,      Hillis Range, MD  Electronically Signed    JA/MedQ  DD: 08/19/2008  DT: 08/20/2008  Job #: [id number]   CC:    Sharlot Gowda, M.D.

## 2010-12-05 NOTE — Consult Note (Signed)
NAMEREEGAN, BOUFFARD               ACCOUNT NO.:  [id number]   MEDICAL RECORD NO.:  [id number]          PATIENT TYPE:  INP   LOCATION:  3101                         FACILITY:  [facility]   PHYSICIAN:  Vesta Mixer, M.D. DATE OF BIRTH:  02-11-1951   DATE OF CONSULTATION:  04/02/2008  DATE OF DISCHARGE:                                 CONSULTATION   [name] is a 60 year old gentleman with a history of obstructive  sleep apnea, hypertension, and palpitations.  He is admitted today after  having symptoms consistent with a stroke.  He was found to have rapid  atrial fibrillation.   The patient has had a history of palpitations, but has never had any  clear-cut documentation.  He has had a Cardiolite study with Dr. Mayford Knife,  which apparently was unremarkable.   The patient awoke this morning and had clear-cut symptoms of stroke.  He  was not able to get dressed.  He was brought to the emergency room and  was placed on heparin.  His symptoms have steadily improved since that  time.  He was found to have atrial fibrillation and we were called to  evaluate him.   The patient has had intermittent atrial fibrillation, but has never  really lasted quite this long.  He typically develops palpitations and  develops associated diaphoresis.  He denies any syncope or presyncope.  He denies any chest pain, but he does have some chronic shortness of  breath.   CURRENT MEDICATIONS:  1. Toprol XL 25 mg a day.  2. Diovan 160 mg a day.  3. Cardizem drip currently at 15 mg per hour.   PAST MEDICAL HISTORY:  1. Hypertension.  2. Obstructive sleep apnea.   SOCIAL HISTORY:  The patient is a Programmer, multimedia.  He has a remote history of  smoking.  He does not drink alcohol.   Family history is noncontributory.   Review of systems is reviewed and is essentially negative.   PHYSICAL EXAMINATION:  GENERAL:  He is a middle-aged gentleman in no  acute distress.  VITAL SIGNS:  His heart rate is in the  120s, blood pressure 115/70.  HEENT:  2+ carotids.  He has no bruits, no JVD, no thyromegaly.  LUNGS:  Clear.  HEART:  Irregularly irregular.  ABDOMEN:  Obese.  EXTREMITIES:  He has trace edema.  He has good pulses.   His EKG reveals atrial fibrillation with rapid ventricular response.   His laboratory data is essentially unremarkable.   His EKG reveals rapid atrial fibrillation with nonspecific ST-T wave  changes.   [name] presents with clear-cut documentation of rapid atrial  fibrillation, now also has a clear-cut documentation of a stroke and  certainly needs chronic anticoagulation.  I agree with his heparin for  now and we will transition him to Coumadin.  We will continue the  Cardizem and increase slightly.  We will also increase his beta blocker  slightly.  We will check a TSH.  All of his other medical problems  remained stable.      Vesta Mixer, M.D.  Electronically Signed  PJN/MEDQ  D:  04/02/2008  T:  04/03/2008  Job:  [id number]   cc:   Sharlot Gowda, M.D.  Marlan Palau, M.D.  Armanda Magic, M.D.

## 2010-12-05 NOTE — H&P (Signed)
[name]               ACCOUNT NO.:  [id number]   MEDICAL RECORD NO.:  [id number]          PATIENT TYPE:  INP   LOCATION:  3101                         FACILITY:  [facility]   PHYSICIAN:  Marlan Palau, M.D.  DATE OF BIRTH:  02-24-1951   DATE OF ADMISSION:  04/02/2008  DATE OF DISCHARGE:                              HISTORY & PHYSICAL   HISTORY OF PRESENT ILLNESS:  [name] is a 60 year old right-handed  white male born 03-20-1951, with a history of hypertension  followed by Dr. Susann Givens.  This patient presents today after awakening  with right leg numbness, some mild gait disturbance.  The patient seemed  to improve with numbness through the course of the morning, but around  12 noon had onset of problems with speech disturbance with a garbled  speech and nonsensical speech, difficulty understanding what was being  said to him.  The patient has gradually improved since that time, but is  not quite back to normal.  The patient did report a slight headache.  The patient is not sure there was any visual changes.  Again claims the  numbness has not gone and there was no weakness of the extremities.  The  patient comes in with new-onset atrial fibrillation with rapid  ventricular response rates in the 140s.  The patient has been seen by  Dr. Armanda Magic in the past.   PAST MEDICAL HISTORY:  Significant for,  1. New onset of atrial fibrillation.  2. New-onset aphasia, right leg numbness.  3. Obesity.  4. Obstructive sleep apnea on CPAP.  5. Left renal surgery as a child for some reason, the patient is not      clear.  6. 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   The patient is on Diovan unknown dose, Toprol-XL 50 mg daily.   The patient does not smoke or drink.   Has no known allergies.   SOCIAL HISTORY:  This patient lives in the [place]  area.  Is married.  Works as a Optician, dispensing, although  has been almost  illiterate throughout most of his life.   FAMILY MEDICAL HISTORY:  Notable that mother is alive at age 75 with  stroke in the past.  Father died with a stroke.  The patient has 7  brothers and sisters.  One brother died with hepatitis from IV drug use.   REVIEW OF SYSTEMS:  Notable for no recent fevers or chills.  The patient  again notes slight headache.  Denies any visual field changes, neck  pain, shortness of breath, chest pains, occasional palpitations of the  heart.  No nausea, vomiting, abdominal pain.  Denies any blackout  episodes or dizziness.   PHYSICAL EXAMINATION:  VITAL SIGNS:  Blood pressure is 155/94, heart  rate is up to 140, respiratory rate 24, and temperature afebrile.  GENERAL:  This patient is a moderately-to-markedly obese white male who  is alert, cooperative at the time of examination.  HEENT:  Head is atraumatic.  Eyes, pupils are  equal, round, and reactive  to light.  Disks are flat bilaterally, although the disk margin on the  left is blurred, normal on the right.  NECK:  Supple.  No carotid bruits noted.  RESPIRATORY:  Clear.  CARDIOVASCULAR:  Irregularly irregular rhythm with no obvious murmurs or  rubs noted.  EXTREMITIES:  Reveal trace edema at the ankles bilaterally.  ABDOMEN:  Obese.  Positive bowel sounds.  No organomegaly or tenderness  noted.  NEUROLOGIC:  Cranial nerves as above.  Facial symmetry is  present.  The patient has good sensation of the face to pinprick and  soft touch bilaterally.  Good strength of facial muscles, muscles of  head turning and shrug bilaterally.  Speech is well enunciated.  Mild  aphasia was noted.  The patient has difficulty understanding verbal  commands at times.  The patient again has full extraocular movements.  Visual fields are full double simultaneous stimulation.  Motor testing  shows 5/5 strength in all fours.  Good symmetric motor is noted  throughout.  Sensory testing is intact to pinprick,  soft touch,  vibratory sensation throughout.  The patient has good finger-nose-  finger, heel-to-shin.  Gait was not tested.  Deep tendon reflexes were  depressed, but symmetric.  Toes are neutral bilaterally.  No drift is  seen in the arms or legs.   NIH stroke scale score is 1 for the mild aphasia.   LABORATORY VALUES:  Notable for white count of 8.1, hemoglobin of 14.4,  hematocrit of 43.4, MCV of 90.4, platelets of 193, and INR of 1.1.  Sodium 140, potassium of 3.9, chloride of 109, CO2 of 22, glucose of 96,  BUN of 12, creatinine 0.82, total bili 1.2, alkaline phosphatase 77,  SGOT of 45, SGPT of 63, total protein of 6.8, albumin of 4.0, calcium  9.2, troponin I of less than 0.05.  Urinalysis reveals specific gravity  of 1.019, pH 6.5, otherwise unremarkable.  CT of the head is as above.  There is no evidence of an acute infarct, but there is a question of a  hyperdense branch, the left middle cerebral artery possible thrombosis  or embolism.   IMPRESSION:  1. 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  2. Atrial fibrillation with rapid ventricular response.   This patient has had improving deficits throughout the day.  He  currently has a NIH stroke scale score of 1.  CT questions a thrombus  within the left middle cerebral.  We will admit this patient for further  evaluation.  The patient may require a Cardizem drip for rate control.   PLAN:  1. Admission to [facility].  2. MRI of the brain.  3. MRI angiogram of the intracranial and extracranial vessels.  4. A 2-D echocardiogram.  5. Cardiology evaluation.  6. IV fluids.  7. IV heparin therapy.   The patient will be followed closely while in-house.      Marlan Palau, M.D.  Electronically Signed     CKW/MEDQ  D:  04/02/2008  T:  04/03/2008  Job:  [id number]   cc:   Sharlot Gowda, M.D.  Armanda Magic, M.D.  [facility]

## 2010-12-05 NOTE — Op Note (Signed)
NAMEDERRIL, FRANEK               ACCOUNT NO.:  [id number]   MEDICAL RECORD NO.:  [id number]          PATIENT TYPE:  INP   LOCATION:  3729                         FACILITY:  [facility]   PHYSICIAN:  Jake Bathe, MD      DATE OF BIRTH:  [birth year]   DATE OF PROCEDURE:  05/14/2008  DATE OF DISCHARGE:                               OPERATIVE REPORT   INDICATIONS:  Atrial fibrillation.   PROCEDURE:  Direct current outpatient cardioversion.   BRIEF HISTORY:  A 60 year old male with an ejection fraction of 40-50%  with recent new-onset persistent atrial fibrillation with a subsequent  left-sided CVA on April 02, 2008 here for cardioversion.  He has  never had a prior cardioversion.  He has been treated for greater than 4  weeks with therapeutic INRs greater than 2.0.  INR today is 2.6.  Potassium was normal.  Creatinine is normal.   PROCEDURE IN DETAIL:  Informed consent was obtained with wife and the  patient present.  All risks of stroke, heart attack, death, and  arrhythmia were explained to the patient and his wife at length.  Pads  were placed on anterior and posterior chest wall.  After anesthesia  assistance with sodium Pentothal, he was adequately sedated.  A biphasic  synched shock at 150 joules was attempted.  He was in sinus rhythm for  about 3-4 seconds.  He then subsequently converted back to atrial  fibrillation.  A secondary shock at 200 biphasic joules was then  attempted.  He was in sinus rhythm for the same amount of time, then  went back to atrial fibrillation with a heart rate of approximately 100-  110.  He tolerated the procedure well without any difficulties.  Findings were discussed with both him and his wife.   PLAN:  Findings were discussed with Dr. Mayford Knife, his primary cardiologist  who will plan on utilizing antiarrhythmic therapy.      Jake Bathe, MD  Electronically Signed     MCS/MEDQ  D:  05/14/2008  T:  05/14/2008  Job:  [id number]

## 2010-12-05 NOTE — Op Note (Signed)
[name], [name]               ACCOUNT NO.:  [id number]   MEDICAL RECORD NO.:  [id number]          PATIENT TYPE:  AMB   LOCATION:  ENDO                         FACILITY:  [facility]   PHYSICIAN:  Hillis Range, MD       DATE OF BIRTH:  09-30-1950   DATE OF PROCEDURE:  DATE OF DISCHARGE:  07/26/2008                               OPERATIVE REPORT   SURGEON:  Hillis Range, MD   FIRST ASSIST:  Doylene Canning. Ladona Ridgel, MD   PREPROCEDURE DIAGNOSES:  1. Typical appearing right atrial flutter.  2. Atrial fibrillation.   POSTPROCEDURE DIAGNOSES:  1. Typical appearing right atrial flutter.  2. Atrial fibrillation.   PROCEDURES:  1. Comprehensive electrophysiology study.  2. Coronary sinus pacing and recording.  3. Arterial blood pressure monitoring.  4. Intracardiac echocardiography.  5. Pulmonary vein venography.  6. Three-dimensional mapping of supraventricular tachycardia.  7. Radiofrequency ablation of supraventricular tachycardia.  8. Arrhythmia induction following ablation with rapid atrial pacing.   DESCRIPTION OF PROCEDURE:  Informed written consent was obtained and the  patient was brought to the electrophysiology lab in the fasting state.  He was adequately sedated with intravenous medications as outlined in  the anesthesia report.  The patient's right and left groins were prepped  and draped in the usual sterile fashion by the EP lab staff.  Using a  percutaneous Seldinger technique, one 6, one 7, and one 11-French  hemostasis sheaths were placed into the right common femoral vein.  A 4-  [place] hemostasis sheath was placed into the right common femoral artery  for blood pressure monitoring.  A 6-French decapolar Polaris X catheter  was introduced through the right common femoral vein and advanced into  the coronary sinus for recording and pacing from this location.  A 6-  French quadripolar Josephson catheter was introduced in the right common  femoral vein and advanced into the  right ventricle for recording and  pacing.  The catheter was then pulled back to the His bundle location.  The patient presented to the electrophysiology lab in normal sinus  rhythm.  His RR interval measured 890 milliseconds with a PR interval of  209 milliseconds.  A QRS duration of 99 milliseconds and a QT interval  415 milliseconds.  The AH interval measured 84 milliseconds with an HV  interval of 42 milliseconds.  Ventricular pacing was performed, which  revealed VA dissociation at 600 milliseconds.  His catheter was removed  and in its place a 10-French Delphi intracardiac  echocardiography catheter was advanced into the right atrium.  An  intracardiac echocardiography of the left atrium was performed and a  three-dimensional reconstruction of the left atrium and the pulmonary  veins was generated using [employer].  The patient was noted  to have an enlarged left atrium as well as very large pulmonary veins  with a common ostium to the left superior and left inferior pulmonary  veins.  The middle common femoral vein sheath was exchanged for an 8.5  [place] SL2 transseptal sheath and transseptal access was achieved in a  standard fashion  using a Brockenbrough needle under biplane fluoroscopy  with intracardiac echocardiography to confirm transseptal location.  Once transseptal access had been achieved, heparin was administered  intravenously and intra-arterially noticed to maintain an ACT of greater  than 400 seconds throughout the procedure.  A 6-French multipurpose  angiographic catheter with guidewire was introduced through the  transseptal sheath and positioned over the mouth of all four pulmonary  veins.  This demonstrated very large pulmonary veins with a common  ostium to the left superior and left inferior pulmonary veins.  The  angiographic catheter was then removed.  The intracardiac  echocardiography catheter was removed and in its place a 3.5  mm Biosense  Webster EZ Woodridge ThermoCool ablation catheter was advanced into the  right atrium.  The transseptal sheath was pulled back into the IVC over  a guidewire.  The ablation catheter was advanced across the transseptal  hole using the wire as a guide.  The transseptal sheath was then re-  advanced over the guidewire into the left atrium.  A 20 pole 25-mm  circular mapping catheter was introduced through the transseptal sheath  and positioned over the mouth of the pulmonary veins.  This demonstrated  electrical conduction within all four pulmonary veins at baseline.  The  left superior and left inferior pulmonary veins were successfully  electrically isolated and anatomically encircled using a wide atrial  circumferential ablation approach over the common ostium to the left  superior and left inferior pulmonary veins.  The right superior and  right inferior pulmonary veins were also successfully electrically  isolated and anatomically encircled using a wide atrial circumferential  ablation technique.  Following ablation, the ablation catheter was  pulled back into the right atrium.  The transseptal sheath was also  pulled back into the right atrium.  A series of radiofrequency  applications were delivered between the tricuspid valve annulus and the  inferior vena cava along the usual cavotricuspid isthmus.  The patient  was noted to have a coronary sinus diverticula as well as a very  prominent ridge along the cavotricuspid isthmus.  This required  extensive radiofrequency ablation.  The irrigated catheter was used to  deliver multiple applications along the cavotricuspid isthmus without  effect.  The ablation catheter was therefore placed through the SL2  transseptal sheath, which was positioned along the cavotricuspid isthmus  and additional radiofrequency applications were delivered with some  swelling through the cavotricuspid isthmus.  The ablation catheter was  then exchanged  for a 7-French [employer] II XP ablation  catheter.  Additional radiofrequency applications were delivered along  the mouth of the coronary sinus within the diverticula of the coronary  sinus and along the deep groove of the cavotricuspid isthmus.  Following  ablation of the stimulus when pacing from the coronary sinus to the  earliest atrial activation recorded across the cavotricuspid isthmus  measured 146 milliseconds.  Differential pacing from the low lateral  right atrium confirmed complete isthmus block.  Rapid atrial pacing was  then performed down to a cycle length of 200 milliseconds with no  inducible atrial fibrillation or atrial flutter.  The AV Wenckebach  cycle length was 530 milliseconds with no evidence of PR greater than  RR.  The procedure was therefore considered completed.  All catheters  were removed and the sheaths were aspirated and flushed. A 30 mg of  intravenous protamine was administered to reverse heparin  anticoagulation prior to sheath removal.  A limited bedside  transthoracic echocardiogram  revealed no pericardial effusion.  The  patient was alert and moving all extremities prior to transfer to the  recovery area for sheath removal per protocol.  There were no early  apparent complications.   CONCLUSIONS:  1. Normal sinus rhythm upon presentation.  2. Large pulmonary veins with a common ostium to the left superior and      left inferior pulmonary veins.  3. Successful electrical isolation and anatomical encircling of all      four pulmonary veins using Waca technique.  4. Successful cavotricuspid isthmus ablation with isthmus block      achieved.  5. No inducible arrhythmias following ablation with rapid atrial      pacing down to a cycle length of 200 milliseconds.  6. No early apparent complications.      Hillis Range, MD  Electronically Signed     JA/MEDQ  D:  07/27/2008  T:  07/28/2008  Job:  [id number]   cc:   Armanda Magic,  M.D.  Sharlot Gowda, M.D.

## 2010-12-05 NOTE — Letter (Signed)
August 31, 2008    Armanda Magic, MD  [address]  [place], [place] [address]   RE:  [name], [name]  MRN:  [id number]  /  DOB:  June 09, 1951   Dear Gloris Manchester,   It was my pleasure to see [name] in electrophysiology followup  today.  As you well know, he is a pleasant 60 year old gentleman with  persistent atrial fibrillation and typical appearing atrial flutter.  He  was previously evaluated by me and underwent ablation for typical  appearing atrial flutter as well as atrial fibrillation on July 26, 2008.  He was noted to have very large and prominent pulmonary veins  which required a moderate amount of ablation to electrically isolated  all 4 veins.  A wide area circumferential ablation approach was used.  Extensive ablation was also performed along the usual cava tricuspid  isthmus.  He was noted to have a very thick isthmus as well as a  coronary sinus diverticulum.  Though isthmus block was initially  achieved.  It appears that he has had recurrence of typical appearing  atrial flutter.  He underwent successful cardioversion to sinus rhythm  last week and has done well since that time.  Upon last being seen in my  clinic, he also complained of dysphagia, which has subsequently resolved  with omeprazole twice daily.  A chest CT revealed no evidence of atrial  esophageal fistula or esophageal necrosis.   Today, [name] presents for followup.  Presently, he is without  complaint.  His dysphasia has completely resolved.  He denies chest  pain, shortness of breath, orthopnea, PND, palpitations, presyncope, or  syncope.  He reports ongoing shortness of breath with moderate  activities, but feels that his fatigue has slightly improved.  Unfortunately, he has had further difficulties with weight gain and  compliance with lifestyle modification.  He is tolerating Coumadin  without any bleeding and denies any neurologic sequel.   PROBLEM LIST:  1. Typical  appearing atrial flutters and persistent atrial      fibrillation, status post electrophysiology study and      radiofrequency ablation on July 26, 2008.  2. Hypertension.  3. Hyperlipidemia.  4. Obstructive sleep apnea on continuous positive airway pressure      therapy.  5. Morbid obesity.  6. Recent ejection fraction of 40-50%.  7. New York Heart Association class II symptoms chronically.   MEDICATIONS:  1. Coumadin to maintain an INR between 2 and 3.  2. Metoprolol 50 mg b.i.d.  3. Sotalol 160 mg b.i.d.  4. Omeprazole 20 mg b.i.d.  5. Simvastatin 40 mg daily.  6. Valacyclovir 500 mg daily.   PHYSICAL EXAMINATION:  VITALS:  Blood pressure 130/80, heart rate 84,  respirations 18, and weight 295 pounds.  GENERAL:  The patient is a morbidly obese-appearing gentleman in no  acute distress.  He is alert and oriented x3.  HEENT:  Normocephalic, atraumatic.  Sclerae clear.  Conjunctivae pink.  Oropharynx clear.  NECK:  Supple.  No thyromegaly, JVD, or bruits.  LUNGS:  Clear to auscultation bilaterally.  HEART:  Regular rate and rhythm.  No murmurs, rubs, or gallops.  GI:  Soft, nontender, and nondistended.  Positive bowel sounds.  EXTREMITIES:  No clubbing, cyanosis, or edema.  NEUROLOGIC:  Strength and sensation are intact.  SKIN:  No ecchymosis or lacerations.  MUSCULOSKELETAL:  No deformity or atrophy.  PSYCHIATRIC:  Euthymic mood.  Full affect.   EKG today reveals sinus rhythm at  76 beats per minute with a septal  infarct pattern.   IMPRESSION:  [name] is a pleasant 60 year old gentleman with atrial  flutter persistent atrial fibrillation, who presents today for followup.  I am pleased that his esophageal dysphagia has completely resolved.  I  suspect that he may have sustained submucosal injury to the esophagus  with his ablation procedure.  However, this appears to be healing nicely  with omeprazole therapy.  He appears to have had recurrent atrial  flutter which  has not returned status post cardioversion.  He is  presently maintaining sinus rhythm and doing rather well.  I am  concerned that he continues to have difficulties with the weight gain  and lifestyle modifications.  So, I have performed extensive counseling  of more than 30 minutes today regarding the importance of lifestyle  modification including regular exercise and weight loss.  I have made no  medication changes today.  I think that he should continue sotalol as  well as anticoagulation with Coumadin.  I think that metoprolol will  also be important for rate control.  I have asked him to follow up in my  office in 7 weeks for further evaluation.  He was also instructed to  contact me if he has further symptomatic arrhythmias.   Thank you again for the opportunity of participating in the care of Mr.  Peterson.  Please feel free to contact me if you wish to discuss his care  further.    Sincerely,      Hillis Range, MD  Electronically Signed    JA/MedQ  DD: 08/31/2008  DT: 09/01/2008  Job #: [id number]

## 2010-12-05 NOTE — Letter (Signed)
June 30, 2008    Armanda Magic, MD  [address]  [place], [place] [address]   RE:  [name], [name]  MRN:  [id number]  /  DOB:  1951-02-22   Dear Gloris Manchester,   It was my pleasure to see your patient, [name], in EP  consultation today regarding therapeutic strategies for atrial  fibrillation.  As you know, [name] is a very pleasant 60 year old  gentleman with persistent atrial fibrillation and typical appearing  atrial flutter.  He was initially diagnosed with atrial fibrillation and  atrial flutter in September 2009 after presenting with a cerebrovascular  accident.  In retrospect, he has had symptoms of atrial fibrillation for  5 years.  He describes these symptoms as palpitations and heart  racing.  He notes significant decline in energy and stamina over the  past few months as well as a decrease in his exercise capacity.  He is  not completely sure if this is secondary to atrial fibrillation or a  residual effect from his recent stroke.  He has been initiated on  sotalol, but continues to have symptomatic episodes of atrial  fibrillation despite this therapy.  He has also failed medical therapy  with digoxin, metoprolol, and Cardizem.  He is maintained on Coumadin  without bleeding and denies further neurologic events.  He is unaware of  any triggers or precipitance for his atrial fibrillation.  He has  previously required cardioversion to maintain sinus rhythm.  He is  otherwise without complaint today.   PAST MEDICAL HISTORY:  1. Persistent atrial fibrillation and typical appearing atrial      flutter.  2. 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  3. Left central retinal artery occlusion, diagnosed in 2008.  4. Hypertension.  5. Hyperlipidemia.  6. Morbid obesity.  7. Obstructive sleep apnea compliant with CPAP.  8. Status post prior known kidney surgery at age 24.  44. Chronic left leg  skin infection.   ALLERGIES:  No known drug allergies.   CURRENT MEDICATIONS:  1. Digoxin 0.25 mg daily.  2. Simvastatin 40 mg daily.  3. Keflex 500 mg b.i.d.  4. Coumadin to maintain an INR between 2 and 3.  5. Metoprolol 50 mg b.i.d.  6. Cardizem 360 mg daily.  7. Sotalol 160 mg twice daily.   SOCIAL HISTORY:  The patient lives in [place], [place], with  his spouse and 52-1/2-year-old son.  He has a remote history of tobacco,  but quit 30 years ago.  He also has a heavy history of alcohol  consumption, but quit 30 years ago.  He previously used marijuana and  cocaine, but denies use over the past 30 years.  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   FAMILY HISTORY:  The patient's father had a stroke.   REVIEW OF SYSTEMS:  All systems are reviewed and negative except as  outlined in the HPI above.   PHYSICAL EXAMINATION:  VITALS:  Blood pressure 135/79, heart rate 61,  respirations 18, weight 289 pounds.  GENERAL:  The patient is a morbidly obese male in no acute distress.  He  is alert and oriented x3.  HEENT:  Normocephalic, atraumatic.  Sclerae clear.  Conjunctivae pink.  Oropharynx clear.  NECK:  Supple.  No JVD, lymphadenopathy, or bruits.  LUNGS:  Clear to auscultation bilaterally.  HEART:  Regular rate and rhythm.  No murmurs, rubs, or gallops.  GI:  Soft, nontender, nondistended.  Positive bowel sounds.  EXTREMITIES:  No clubbing, cyanosis, or edema.  NEUROLOGIC:  Nonfocal.  SKIN:  The patient has an ulcerated lesion on the left posterior thigh  with mild erythema surrounding.  MUSCULOSKELETAL:  No deformity or atrophy.  PSYCHIATRIC:  Euthymic mood, full affect.   Transthoracic echocardiogram obtained on April 06, 2008, reveals a  left ventricular end-diastolic dimension of 42 mm with an  interventricular septum of 17 mm and a posterior wall of 15 mm.  The  left atrial dimension is 54 mm.  The ejection fraction was 40-50% with   inferoposterior hypokinesis.  Moderate mitral regurgitation was noted.   EKG today reveals normal sinus rhythm at 60 beats per minute with  nonspecific ST/T-wave changes.  EKGs performed on June 03, 2008,  document atrial fibrillation with rapid ventricular rates.  an EKG from  May 05, 2008, also demonstrates typical appearing right atrial  flutter with 2:1 conduction and a ventricular rate of 150 beats per  minute.   IMPRESSION:  [name] is a pleasant 60 year old gentleman with a  history of persistent atrial fibrillation and typical appearing atrial  flutter.  He also has a history of cerebrovascular stroke which was felt  to be embolic secondary to atrial fibrillation.  He has hypertension,  obstructive sleep apnea, and obesity.  A transthoracic echocardiogram  also reveals a reduced ejection fraction with left ventricular  hypertrophy and a significantly enlarged left atrium with moderate  mitral regurgitation.  The patient appears to be symptomatic with his  atrial fibrillation and recalls symptoms of atrial fibrillation over the  past 5 years.  I think that he would therefore benefit from rhythm  control.  He is only moderately controlled presently with sotalol.  I  think that catheter ablation for atrial fibrillation would be a  reasonable option.  I do, however, think that we should further  investigate his mitral regurgitation.  Should it be severe, then mitral  valve repair with surgical [place] would be the preferred option.  However,  should his mitral regurgitation be only moderate, then I think that we  could proceed to catheter ablation with long term following of his  mitral valve disease.   PLAN:  Therapeutic strategies for atrial fibrillation including both  medicine and catheter-based therapies were discussed at length in clinic  with the patient and his wife today.  Risks, benefits, and alternatives  to EP study and radiofrequency ablation for atrial  fibrillation were  also discussed in detail.  The patient is aware that his anticipated  success rates for catheter ablation of atrial fibrillation are likely  reduced due to his persistent atrial fibrillation as well as his  significantly enlarged left atrium.  He nevertheless wishes to proceed  with catheter ablation for atrial fibrillation.  We will therefore  schedule the patient for transesophageal echocardiogram.  If his mitral  valve disease is moderate, then we will proceed with catheter ablation;  however, if he has severe mitral valve disease, then we will consider  surgical consultation.   Traci, thank you for the opportunity of participating in the care of Mr.  Peterson.  Please feel free to contact me if you wish to discuss his care  further.     Sincerely,      Hillis Range, MD  Electronically Signed    JA/MedQ  DD: 06/30/2008  DT: 07/01/2008  Job #: [id number]   CC:    Sharlot Gowda, M.D.

## 2010-12-05 NOTE — Letter (Signed)
August 31, 2008     RE:  JAVIS, ABBOUD  MRN:  [id number]  /  DOB:  Jul 21, 1951   To Whom It May Concern:   [name] is a pleasant 60 year old gentleman with persistent atrial  fibrillation and atrial flutter who recently underwent an ablation for  his atrial arrhythmias.  He continues to recover from his procedure and  remains under my care.  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  I think that for the next 7 weeks it would be prudent for  him to remain within our local area.  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  Your understanding of  his current medical condition is appreciated.    Sincerely,      Hillis Range, MD  Electronically Signed    JA/MedQ  DD: 08/31/2008  DT: 09/01/2008  Job #: [id number]

## 2010-12-05 NOTE — Discharge Summary (Signed)
[name], [name]               ACCOUNT NO.:  [id number]   MEDICAL RECORD NO.:  [id number]         PATIENT TYPE:  CINP   LOCATION:                               FACILITY:  [facility]   PHYSICIAN:  Pramod P. Pearlean Brownie, MD    DATE OF BIRTH:  Nov 07, 1950   DATE OF ADMISSION:  04/09/2008  DATE OF DISCHARGE:                               DISCHARGE SUMMARY   DIAGNOSIS AT THE TIME OF DISCHARGE:  1. Left hemispheric deep white matter periventricular infarct      secondary to atrial fibrillation.  2. New-onset atrial fibrillation.  3. Obesity.  4. Obstructive sleep apnea, using continuous positive airway pressure      at home.  5. History of renal surgery as a child, the patient is unclear why.  6. History of left central retinal artery occlusion with visual      impairment in the left eye.   MEDICINES AT THE TIME OF DISCHARGE:  1. Coumadin 7.5 mg alternating with 5 mg every other day.  2. Lopressor 75 mg p.o. b.i.d.  3. Cardizem 360 mg a day.  4. Lipitor 10 mg a day.   STUDIES PERFORMED:  1. CT of the brain on admission shows possible left MCA hyperdense      branch arising from the left MCA with no hemorrhage.  2. MRI of the brain shows acute left hemispheric deep white matter      periventricular infarct without hemorrhage.  3. MRA of the head shows gross carotid and basilar artery patency,      severe motion degradation.  4. CT angio of the head shows no significant lesions.  5. CT angio of the neck shows no flow reducing extracranial lesions.  6. A 2D echocardiogram shows EF of 40-50% with findings suggestive of      hypokinesis of inferior posterior wall, left atrium mildly dilated,      moderate tricuspid valvular regurgitation, right atrium dilated,      inferior vena cava moderately dilated, no obvious source of      embolus.  7. Carotid Doppler shows no ICA stenosis.  8. EKG shows atrial fibrillation with rapid ventricular response first      seen on November 01, 2007, on admission,  reconfirmed on April 07, 2008.   LABORATORY STUDIES:  His INR on the day of discharge is 2.1.  CBC  normal.  Chemistry with glucose 115, otherwise normal.  Liver function  tests with SGOT 42, otherwise normal.  Homocystine 8.  Cholesterol 192,  triglycerides 176, HDL 34, LDL 127.  TSH 1.469.  Hemoglobin A1c 6.2.  Urine drug screen negative.  Troponin I 0.01.  Cardiac enzymes negative.   HISTORY OF PRESENT ILLNESS:  [name] is a 60 year old, right-  handed Caucasian male with a history of hypertension.  He presents on  the day of admission after awakening with right leg numbness and some  mild gait disturbance.  The patient seemed to improve with numbness  through the course of the morning, but around 12 noon had onset of  problems with speech  disturbance with difficulty understanding what was  being said to him and nonsensical speech.  The patient gradually  improved since that time but is not back to normal.  The patient also  reported a slight headache.  He denied any visual changes.  In emergency  room, the patient was found to have new-onset atrial fibrillation with  rapid ventricular response with rates in 140s.  The patient had seen Dr.  Mayford Knife in the past.  He was not a TPA candidate secondary to time of  onset being greater than 3 hours.  He was admitted to the hospital for  further stroke evaluation.   HOSPITAL COURSE:  MRI did reveal acute infarct in the right cerebral  areas that were thought to be secondary to atrial fibrillation.  The  patient was placed on IV heparin and transitioned to Coumadin.  INR was  2.1 on the day of discharge.  Cardiology was consulted for atrial  fibrillation.  Adjustments in his medicines were made, and rate was  controlled.  At the time of discharge, Dr. Mayford Knife will follow up him in  the office for his Coumadin, as well as his cardiac issues.  He has been  asked to follow up with Dr. Delia Heady in 2-3 months.  Of note, he  has  no neurologic deficits related to the stroke and has no therapy needs.   CONDITION ON DISCHARGE:  The patient was alert and oriented x3.  Speech  clear.  No aphasia.  Visual fields full.  Gait steady.  No extremity  weakness.  No sensory loss.   DISCHARGE/PLAN:  1. Discharge home with family.  2. Coumadin for secondary stroke prevention.  Follow up with Dr.      Norris Cross office on Monday, April 12, 2008, at 11:00 a.m. for      blood check.  3. No therapy needs.  4. Follow up with Dr. Delia Heady in 2-3 months, call office for      appointment.      Annie Main, N.P.    ______________________________  Sunny Schlein. Pearlean Brownie, MD    SB/MEDQ  D:  04/09/2008  T:  04/10/2008  Job:  [id number]   cc:   Pramod P. Pearlean Brownie, MD  Armanda Magic, M.D.

## 2010-12-05 NOTE — H&P (Signed)
NAMEJAVONNIE, [name]               ACCOUNT NO.:  [id number]   MEDICAL RECORD NO.:  [id number]          PATIENT TYPE:  INP   LOCATION:  4707                         FACILITY:  [facility]   PHYSICIAN:  Corky Crafts, MDDATE OF BIRTH:  11-03-1950   DATE OF ADMISSION:  05/05/2008  DATE OF DISCHARGE:                              HISTORY & PHYSICAL   CHIEF COMPLAINT:  Rapid heart rate.   HISTORY OF PRESENT ILLNESS:  [name] is a pleasant 60 year old  male patient of Dr. Gloris Manchester Turner's with recent history of left CVA  secondary to atrial fibrillation with cardioembolic source.  This  occurred on April 02, 2008.  He was anticoagulated and rate  controlled with a goal of elective cardioversion on an outpatient basis.  He was and has not been symptomatic with atrial fibrillation.  He has  had no palpitations or tachyarrhythmias.  No chest pain, pressure,  shortness of breath.  He also denies any lightheadedness or syncope.  Wife called earlier today concerned that his heart rate was in 140s.  This was on his blood pressure machine.  He was advised to come in for  follow-up and found to be in 2:1 flutter with rapid ventricular rate of  138.  He is being admitted for the same.   REVIEW OF SYSTEMS:  GENERAL:  Has felt well.  Good energy.  No recent  weight gain or loss.  HEENT:  No problems with vision, hearing, nasal  discharge, epistaxis, sore throat.  NECK:  No stiffness.  CHEST:  No  cough or wheezing.  No PND, orthopnea.  CARDIOVASCULAR:  Denies chest  pain or pressure.  Again, no palpitations.  ABDOMEN:  No nausea,  vomiting, abdominal pain, black stools, hematochezia.  GENITOURINARY:  No dysuria, frequency, hematuria, nocturia.  NEUROLOGICAL:  No numbness,  tingling, headache, seizures, memory loss.  Recent CVA, as above.   PAST MEDICAL HISTORY:  1. Atrial fibrillation with rapid ventricular rate, new onset.  2. Left CVA secondary to above with no residual symptoms April 02, 2008.  3. Chronic Coumadin.  4. Hypertension.  5. Dyslipidemia.  6. Obesity.  7. Obstructive sleep apnea.   PAST SURGICAL HISTORY:  Status post kidney surgery at age 22.   CURRENT MEDICATIONS:  1. Simvastatin 40 mg a day.  2. Cardizem LA 360 mg daily.  3. Metoprolol tartrate 100 mg twice daily.  4. Coumadin 5 mg daily except 7.5 mg on Fridays.  5. CPAP at bedtime.   DRUG ALLERGIES:  None known.   OBJECTIVE DATA:  Weight is 281, pulse 140, blood pressure 130/90,  respirations 18.  GENERAL:  Very pleasant gentleman in no acute distress.  SKIN:  Warm and dry.  HEENT:  Unremarkable.  NECK:  No JVD.  Carotid upstrokes are +2 without bruit auscultated.  CHEST:  Good excursion, clear throughout without rales, wheezing or  rhonchi.  CARDIOVASCULAR:  Rapid S1-S2, rate at 142 beats per minute.  No murmur  or rub auscultated.  ABDOMEN:  Soft and nondistended.  Bowel sounds are present.  No  abdominal bruits.  No tenderness, hepatosplenomegaly.  EXTREMITIES:  No clubbing, cyanosis or edema; +2 distal pulses.   EKG reveals 2:1 atrial flutter with ventricular rate of at 139 beats per  minute.   IMPRESSION:  1. A 2:1 atrial flutter with rapid ventricular response.  2. Recent cerebrovascular accident secondary to above.  3. Chronic Coumadin use.  4. Hypertension.  5. Hyperlipidemia.  6. Obstructive sleep apnea.  7. Obesity.   PLAN:  Admit to telemetry.  Will decrease his p.o. Cardizem dose and  start a Cardizem IV drip, 10 mg bolus followed by 10 mg an hour drip,  titrating up to 20 mg an hour to keep heart rate above 60 and blood  pressure above 100 systolic.  Will continue Coumadin.  He has had 4  therapeutic INRs since discharge from the hospital.  We will make him  n.p.o. after midnight for possible cardioversion.  He will need stress  Cardiolite some time in future.  Dr. Mayford Knife to follow.   I personally saw and examined the patient in Dr. Norris Cross absence.  Everette Rank, MD      Laurine Blazer. Melvyn Neth, N.P.      Corky Crafts, MD  Electronically Signed    TCL/MEDQ  D:  05/05/2008  T:  05/05/2008  Job:  [id number]

## 2010-12-05 NOTE — Op Note (Signed)
[name], [name]               ACCOUNT NO.:  [id number]   MEDICAL RECORD NO.:  [id number]          PATIENT TYPE:  OIB   LOCATION:  2899                         FACILITY:  [facility]   PHYSICIAN:  Armanda Magic, M.D.     DATE OF BIRTH:  1950-09-29   DATE OF PROCEDURE:  DATE OF DISCHARGE:  08/24/2008                               OPERATIVE REPORT   PROCEDURE:  Direct current cardioversion.   OPERATOR:  Armanda Magic, MD   INDICATIONS:  Atrial flutter/atrial fibrillation, status post ablation.   COMPLICATIONS:  None.   INTRAVENOUS MEDICATIONS:  Pentothal 250 mg IV.   This is a 60 year old male with a history of atrial fibrillation and  flutter.  He recently underwent a flutter ablation and then developed  recurrent atrial flutter after the ablation.  He has been therapeutic  with an INR greater than 2 over 4 weeks and now presents for  cardioversion.   The patient is brought to the day hospital in the fasting nonsedated  state.  Informed consent was obtained.  The patient was connected to  continuous heart rate, pulse oximetry monitoring, and blood pressure  monitoring.  Defibrillator pads were placed on the anterior left chest  and left posterior back.  After adequate anesthesia was obtained, a  synchronized biphasic 150 joules shock was delivered, which successfully  converted the patient to sinus rhythm.  The patient tolerated the  procedure well without complications.   ASSESSMENT:  1. Atrial flutter status post ablation, now with recurrent atrial      flutter.  2. Systemic anticoagulation with therapeutic INR for 4 weeks.  3. Successful cardioversion to sinus rhythm.   PLAN:  Discharge to home after fully awake.  Follow up with me in 2  weeks.  Continue current medications.      Armanda Magic, M.D.  Electronically Signed     TT/MEDQ  D:  08/24/2008  T:  08/25/2008  Job:  [id number]   cc:   Hillis Range, MD

## 2010-12-05 NOTE — Discharge Summary (Signed)
NAMEAMARRI, SATTERLY               ACCOUNT NO.:  [id number]   MEDICAL RECORD NO.:  [id number]          PATIENT TYPE:  INP   LOCATION:  2916                         FACILITY:  [facility]   PHYSICIAN:  Hillis Range, MD       DATE OF BIRTH:  1951-03-19   DATE OF ADMISSION:  07/27/2008  DATE OF DISCHARGE:  07/28/2008                               DISCHARGE SUMMARY   This patient has no known drug allergies.   FINAL DIAGNOSES:  1. Paroxymsal atrial fibrillation  2. Isthmus dependant atrial flutter   SECONDARY DIAGNOSES:  1. Hypertension.  2. Dyslipidemia.  3. Obstructive sleep apnea on continuous positive airway pressure at      night.  4. Obesity.  5. Recent echocardiogram showing ejection fraction 40-50%.  6. New York Heart Association Class II chronic systolic congestive      heart failure.   Procedures:  EP study and Radiofrequency ablation for atrial  fibrillation and atrial flutter   PLAN:  The patient was started on lisinopril for blood pressure control  10 mg daily.  He will stop Cardizem 360 mg and digoxin 0.25 mg.   PROCEDURE:  July 27, 2008, the patient had an enlarged left atrium.  The left superior and left inferior pulmonary veins shared a common  ostium.  Electrical isolation was successful in isolating all pulmonary  veins.  There was also an anatomically difficult cavotricuspid isthmus  which required prolonged radiofrequency applications.  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  The patient has had no residual from this attack,  however, it was determined that he was in atrial fib and flutter and  that the cerebrovascular accident came from a  cardioembolic source.   The patient was placed on Coumadin therapy.  He was treated with both  Cardizem and metoprolol for rate control for his atrial fib/flutter.   The symptoms the patient experiences with his atrial fib/flutter are  palpitations, heart racing and he also notes significant decline in  energy and stamina over the past few months.   The patient has also have been started on sotalol but continues to have  episodes of atrial fibrillation despite sotalol, Cardizem, and  metoprolol.  He is maintained on Coumadin without bleeding and denies  any further neurologic events.   Mr. Muellner is unaware of any triggers which might precipitate atrial  fibrillation.  He has previously required direct current cardioversion  to maintain sinus rhythm but has breakthrough even on this form  medication protocol.   HOSPITAL COURSE:  The patient presents electively on July 27, 2008.  He underwent the extensive electrophysiology study as mentioned above  with isolation of all pulmonary veins and ablation of the cavotricuspid  isthmus which effectively removed any atrial arrhythmias which may have  appeared on attempts  to induce his rhythm in the electrophysiology lab.  The patient discharging on sinus rhythm with medication changes.  His  groins have been inspected carefully.  There were no hematomas and no  bruits and as mentioned above transthoracic echocardiogram shows no  evidence of pericardial effusion.  The patient goes home on the  following medicines:   1. Sotalol 160 mg twice daily.  2. Metoprolol 50 mg twice daily.  3. Warfarin 5 mg daily except 7.5 mg on Fridays.  4. Simvastatin 40 mg daily at bedtime.  5. Lisinopril a new medication 10 mg daily for blood pressure control.  6. He was asked to stop Cardizem and to stop digoxin.   He follows up at [facility], Dr. Norris Cross office Friday August 13, 2008, at 10 o'clock in the morning for a basic metabolic panel to   check creatinine on this new medication of lisinopril and also to check  blood pressures to see how well the regimen of lisinopril, metoprolol  and sotalol are doing with blood pressure.  He also follows up at  [facility], [address] to see Dr. Johney Frame Friday  September 24, 2008 at 9:45.  He knows to call Dr.Allred's office if he has  recurrence of atrial arrhythmias.   Laboratory studies are confined to this admission protime on the day of  discharge July 28, 2008, is 28.4 and INR 2.5.      Maple Mirza, [place]      Hillis Range, MD  Electronically Signed    GM/MEDQ  D:  07/28/2008  T:  07/29/2008  Job:  [id number]   cc:   Armanda Magic, M.D.  Sharlot Gowda, M.D.

## 2010-12-28 ENCOUNTER — Encounter: Payer: Self-pay | Admitting: Internal Medicine

## 2011-02-01 ENCOUNTER — Encounter: Payer: Self-pay | Admitting: Internal Medicine

## 2011-02-05 ENCOUNTER — Encounter: Payer: Self-pay | Admitting: Internal Medicine

## 2011-02-05 ENCOUNTER — Ambulatory Visit (INDEPENDENT_AMBULATORY_CARE_PROVIDER_SITE_OTHER): Payer: Medicaid Other | Admitting: Internal Medicine

## 2011-02-05 VITALS — BP 115/69 | HR 76 | Resp 18 | Ht 71.0 in | Wt 296.0 lb

## 2011-02-05 DIAGNOSIS — I4891 Unspecified atrial fibrillation: Secondary | ICD-10-CM

## 2011-02-05 DIAGNOSIS — I2510 Atherosclerotic heart disease of native coronary artery without angina pectoris: Secondary | ICD-10-CM

## 2011-02-05 NOTE — Patient Instructions (Signed)
Your physician wants you to follow-up in: 9 months with Dr Allred You will receive a reminder letter in the mail two months in advance. If you don't receive a letter, please call our office to schedule the follow-up appointment.  

## 2011-02-05 NOTE — Assessment & Plan Note (Signed)
Stable No change required today   He will follow-up with Dr Mayford Knife and I will see him again in 9 months.

## 2011-02-05 NOTE — Assessment & Plan Note (Signed)
Weight loss again advised

## 2011-02-05 NOTE — Assessment & Plan Note (Signed)
Stable No change required today  

## 2011-02-05 NOTE — Progress Notes (Signed)
The patient presents today for routine electrophysiology followup.  Since last being seen in our clinic, the patient reports doing very well.   He reports having 1 episode of tachypalpitations on [place] Day but has had no other symptoms of arrhythmia since last being seen in my office. Today, he denies symptoms of palpitations, chest pain, shortness of breath, orthopnea, PND, lower extremity edema, dizziness, presyncope, syncope, or neurologic sequela.  The patient feels that he is tolerating medications without difficulties and is otherwise without complaint today.   Past Medical History  Diagnosis Date  . Persistent atrial fibrillation     s/p afib ablation by [facility] 08/01/08  . Cerebrovascular accident     secondary to cardioembolic event Sept 2009  . Central retinal artery occlusion, left eye     diagnosed in 2008  . Hypertension   . Hyperlipidemia   . Obstructive sleep apnea   . Morbid obesity   . Skin infection     chronic left leg herpetic  . Bell's palsy   . CAD (coronary artery disease)     chronically occluded RCA  . Atrial flutter     atypical atrial flutter post PVI   Past Surgical History  Procedure Date  . Atrial ablation surgery 08/01/08    CTI and PVI ablation by [facility]    Current Outpatient Prescriptions  Medication Sig Dispense Refill  . aspirin (ASPIR-81) 81 MG EC tablet Take 81 mg by mouth daily.        . Cholecalciferol (VITAMIN D3) 50000 UNITS CAPS Take by mouth once a week.        . ezetimibe (ZETIA) 10 MG tablet Take 10 mg by mouth daily.        . isosorbide mononitrate (IMDUR) 30 MG 24 hr tablet Take 30 mg by mouth daily.        . metFORMIN (GLUCOPHAGE) 500 MG tablet Take 500 mg by mouth 2 (two) times daily with a meal.        . metoprolol (LOPRESSOR) 50 MG tablet Take 1 tablet (50 mg total) by mouth 2 (two) times daily.  60 tablet  11  . nitroGLYCERIN (NITROSTAT) 0.4 MG SL tablet Place 0.4 mg under the tongue every 5 (five) minutes as needed.        .  rosuvastatin (CRESTOR) 20 MG tablet Take 20 mg by mouth daily.        . sotalol (BETAPACE) 160 MG tablet TAKE ONE TABLET BY MOUTH TWICE DAILY  60 tablet  6  . Testosterone (ANDROGEL PUMP) 1.25 GM/ACT (1%) GEL Place onto the skin.        . valACYclovir (VALTREX) 500 MG tablet Take 500 mg by mouth daily.        . valsartan (DIOVAN) 160 MG tablet Take 1 tablet (160 mg total) by mouth daily.  30 each  5  . warfarin (COUMADIN) 5 MG tablet Take 5 mg by mouth as directed.          No Known Allergies  History   Social History  . Marital Status: Married    Spouse Name: N/A    Number of Children: N/A  . Years of Education: N/A   Occupational History  . Not on file.   Social History Main Topics  . Smoking status: Former Games developer  . Smokeless tobacco: Not on file  . Alcohol Use: No     former  . Drug Use: No     former  . Sexually Active: Not on  file   Other Topics Concern  . Not on file   Social History Narrative   Lives in [place], [place]. With his spouse and 71-1/2 year old son. Has remote history of tobacco, but quit 30 years ago. Has heavy history of alcohol consumption but quit 30 years ago. Previously used marijuana and cocaine, but denies use over the past 30 years. 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     Family History  Problem Relation Age of Onset  . Stroke Father     Physical Exam: Filed Vitals:   02/05/11 1430  BP: 115/69  Pulse: 76  Resp: 18  Height: 5\' 11"  (1.803 m)  Weight: 296 lb (134.265 kg)    GEN- The patient is overweight appearing, alert and oriented x 3 today.   Head- normocephalic, atraumatic Eyes-  Sclera clear, conjunctiva pink Ears- hearing intact Oropharynx- clear Neck- supple, no JVP Lymph- no cervical lymphadenopathy Lungs- Clear to ausculation bilaterally, normal work of breathing Heart- Regular rate and rhythm, no murmurs, rubs or gallops, PMI not laterally displaced GI- soft, NT, ND, + BS Extremities- no clubbing, cyanosis,  or edema MS- no significant deformity or atrophy Skin- no rash or lesion Psych- euthymic mood, full affect Neuro- strength and sensation are intact, stable L facial droop  ekg today reveal sinus rhythm 77 bpm, anterior infaction, Qtc 454, otherwise normal ekg  Assessment and Plan:

## 2011-02-06 ENCOUNTER — Encounter: Payer: Self-pay | Admitting: Family Medicine

## 2011-02-07 ENCOUNTER — Ambulatory Visit (INDEPENDENT_AMBULATORY_CARE_PROVIDER_SITE_OTHER): Payer: Medicaid Other | Admitting: Medical

## 2011-02-07 ENCOUNTER — Encounter: Payer: Self-pay | Admitting: Medical

## 2011-02-07 VITALS — BP 124/82 | HR 72 | Wt 293.0 lb

## 2011-02-07 DIAGNOSIS — E785 Hyperlipidemia, unspecified: Secondary | ICD-10-CM

## 2011-02-07 DIAGNOSIS — E119 Type 2 diabetes mellitus without complications: Secondary | ICD-10-CM

## 2011-02-07 DIAGNOSIS — E669 Obesity, unspecified: Secondary | ICD-10-CM

## 2011-02-07 DIAGNOSIS — R319 Hematuria, unspecified: Secondary | ICD-10-CM

## 2011-02-07 DIAGNOSIS — I10 Essential (primary) hypertension: Secondary | ICD-10-CM

## 2011-02-07 LAB — COMPREHENSIVE METABOLIC PANEL
ALT: 20 U/L (ref 0–53)
AST: 22 U/L (ref 0–37)
Albumin: 4.3 g/dL (ref 3.5–5.2)
Alkaline Phosphatase: 54 U/L (ref 39–117)
BUN: 21 mg/dL (ref 6–23)
CO2: 23 mEq/L (ref 19–32)
Calcium: 9 mg/dL (ref 8.4–10.5)
Chloride: 104 mEq/L (ref 96–112)
Creat: 0.82 mg/dL (ref 0.50–1.35)
Glucose, Bld: 117 mg/dL — ABNORMAL HIGH (ref 70–99)
Potassium: 4.3 mEq/L (ref 3.5–5.3)
Sodium: 140 mEq/L (ref 135–145)
Total Bilirubin: 0.6 mg/dL (ref 0.3–1.2)
Total Protein: 7.1 g/dL (ref 6.0–8.3)

## 2011-02-07 LAB — POCT GLYCOSYLATED HEMOGLOBIN (HGB A1C): Hemoglobin A1C: 6.8

## 2011-02-07 NOTE — Patient Instructions (Signed)
Kristian Covey, PA-C @PCPADDR @ Phone: [phone] Fax: [phone] February 07, 2011   [name] [address] [place] [address]    Dear [name]:  To help you develop diabetes management strategies, we would like to remind you of the following important guidelines:  1) You should have blood work done at least twice yearly to monitor your Hemoglobin A1C (a three-month average of your blood sugars) and your cholesterol.   2) You should have your urine checked yearly to watch for kidney damage. Diabetes can cause kidney failure, which could require frequent dialysis.  3) You should see an eye doctor and a foot doctor yearly to help prevent diabetic blood vessel complications in your eyes and feet.  4) You should receive a yearly flu shot (between October and March). We also recommend that you receive a pneumonia shot once every ten years, or at least after age 3.  5) Make sure your blood pressure is controlled (less than 130/80). Monitoring your blood pressure with a home blood pressure cuff of your own is an excellent idea for diabetics.  6) If you smoke, quitting will reduce your risk of heart attack and stroke.  7) Exercise regularly since it has beneficial effects on the heart and blood sugars. Exercising three times per week can be as important as medication to a diabetic.   We have included a personalized diabetic report card on the following page to help you assess what you're currently doing well and which areas need improvement.  Thank you for including [place] as members of your health care team.  Sincerely,    Carollee Herter, MD, MD  Enclosure    Diabetic Report Card for [name]  February 07, 2011   Below is a summary of recent tests related to your diabetes that can help you manage your health.   Hemoglobin A1C:  Your Hemoglobin A1C values should be less than 7. If these are greater than 7, you have a higher chance of having eye, heart, and kidney  problems in the future.   Your most recent Hemoglobin A1C values were:  6.8%   Cholesterol:  Your LDL Cholesterol (bad cholesterol) values should be less than 80.  This is currently managed by your heart doctor.   Blood Pressure:  Your blood pressure values should be less than 130/80.   Your most recent blood pressure readings at our clinic were:  BP Readings from Last 3 Encounters:  02/07/11 124/82  11/14/10 130/84  02/05/11 115/69

## 2011-02-07 NOTE — Progress Notes (Signed)
Subjective:   HPI  [name] is a 60 y.o. male who presents for 3 mo f/u on new diagnosis of diabetes.  He has worked on cutting out ice cream and eating healthier.  He eats a log of vegetable.  Drinks occasional diet cokes.   Doesn't eat heavy portions of meat, and doesn't eat much bread or potatoes.  He is exercising some.  He is taking his Metformin.  His last eye doctor visit with Dr. Ashley Royalty within the past year.   He has been checking glucose on average once daily, and numbers usually around 100, the highest has been 149.    Otherwise he is taking his regular meds as usual.  No other c/o.  He normally does not get flu shot, and declines vaccines today.     The following portions of the patient's history were reviewed and updated as appropriate: allergies, current medications, past family history, past medical history, past social history, past surgical history and problem list.  Past Medical History  Diagnosis Date  . Persistent atrial fibrillation     s/p afib ablation by JA 08/01/08  . Cerebrovascular accident     secondary to cardioembolic event Sept 2009  . Central retinal artery occlusion, left eye     diagnosed in 2008  . Hypertension   . Hyperlipidemia   . Obstructive sleep apnea   . Morbid obesity   . Skin infection     chronic left leg herpetic  . Bell's palsy   . CAD (coronary artery disease)     chronically occluded RCA  . Atrial flutter     atypical atrial flutter post PVI  . Diabetes mellitus 11/14/2010    TYPE II  . Obesity   . CHF (congestive heart failure)     history of  . Hypogonadism, male     Review of Systems Constitutional: denies fever, chills, sweats, unexpected weight change, anorexia, fatigue Cardiology: denies chest pain, palpitations, edema Respiratory: denies cough, shortness of breath, wheezing Gastroenterology: denies abdominal pain, nausea, vomiting, diarrhea, constipation Musculoskeletal: denies arthralgias, myalgias, joint  swelling, back pain Ophthalmology: denies vision changes Urology: denies dysuria, difficulty urinating, urinary frequency, urgency Neurology: no headache, weakness, tingling, numbness     Objective:   Physical Exam  General appearance: alert, no distress, WD/WN, white male, obese Skin: no foot lesions Heart: RRR, normal S1, S2, no murmurs Lungs: CTA bilaterally, no wheezes, rhonchi, or rales Abdomen: +bs, soft, non tender, non distended, no masses, no hepatomegaly, no splenomegaly Extremities: no edema, no cyanosis, no clubbing Pulses: 2+ symmetric, upper and lower extremities Neurological: normal monofilament exam   Assessment :    Encounter Diagnoses  Name Primary?  . Type II or unspecified type diabetes mellitus without mention of complication, not stated as uncontrolled Yes  . Obesity   . Hyperlipidemia   . Essential hypertension, benign   . Hematuria      Plan:    Diabetes - new diagnosis last visit 38mo ago.  He has lost 19 lbs!  Congratulated him on this, and per his report, he seems to be making improvements in his diet and with exercise.  His HgbA1C is 6.8%, slightly improved.  Discussed diet, portion control, discussed vaccine recommendations which he declines today.  Discussed regular foot checks, regular eye visits, and gave handout on all this today.  Obesity - c/t efforts to lose weight.   Hyperlipidemia - managed by cardiology  HTN - good control, managed by cardiology  Hematuria - followed by  Urology, benign

## 2011-02-08 LAB — MICROALBUMIN / CREATININE URINE RATIO
Creatinine, Urine: 223.2 mg/dL
Microalb Creat Ratio: 11.8 mg/g (ref 0.0–30.0)
Microalb, Ur: 2.64 mg/dL — ABNORMAL HIGH (ref 0.00–1.89)

## 2011-02-09 ENCOUNTER — Telehealth: Payer: Self-pay

## 2011-02-09 NOTE — Telephone Encounter (Signed)
CALLED PT TO INFORM ABOUT LABS INFORMED

## 2011-04-09 ENCOUNTER — Other Ambulatory Visit: Payer: Self-pay | Admitting: Internal Medicine

## 2011-04-23 LAB — CBC
HCT: 41.9
HCT: 42.4
HCT: 42.6
HCT: 44.6
HCT: 43.2
HCT: 48.4
Hemoglobin: 14.2
Hemoglobin: 14.3
Hemoglobin: 14.3
Hemoglobin: 14.8
Hemoglobin: 14.4
Hemoglobin: 16.1
MCHC: 33.1
MCHC: 33.4
MCHC: 33.6
MCHC: 34.1
MCHC: 33.2
MCHC: 33.3
MCV: 88.7
MCV: 89.3
MCV: 89.7
MCV: 90.1
MCV: 88.5
MCV: 88.5
Platelets: 223
Platelets: 230
Platelets: 233
Platelets: 245
Platelets: 198
Platelets: 245
RBC: 4.73
RBC: 4.75
RBC: 4.75
RBC: 4.95
RBC: 4.88
RBC: 5.47
RDW: 13.8
RDW: 14
RDW: 14.1
RDW: 14.2
RDW: 13.2
RDW: 13.7
WBC: 10.1
WBC: 8
WBC: 8.8
WBC: 9.4
WBC: 7.2
WBC: 8.3

## 2011-04-23 LAB — PROTIME-INR
INR: 1.4
INR: 1.8 — ABNORMAL HIGH
INR: 2 — ABNORMAL HIGH
INR: 2.1 — ABNORMAL HIGH
INR: 2 — ABNORMAL HIGH
INR: 2.1 — ABNORMAL HIGH
INR: 2.4 — ABNORMAL HIGH
INR: 2.4 — ABNORMAL HIGH
INR: 2.4 — ABNORMAL HIGH
INR: 2.5 — ABNORMAL HIGH
INR: 2.6 — ABNORMAL HIGH
INR: 2.7 — ABNORMAL HIGH
Prothrombin Time: 17.5 — ABNORMAL HIGH
Prothrombin Time: 22 — ABNORMAL HIGH
Prothrombin Time: 23.4 — ABNORMAL HIGH
Prothrombin Time: 25 — ABNORMAL HIGH
Prothrombin Time: 23.7 — ABNORMAL HIGH
Prothrombin Time: 24.5 — ABNORMAL HIGH
Prothrombin Time: 27.4 — ABNORMAL HIGH
Prothrombin Time: 27.7 — ABNORMAL HIGH
Prothrombin Time: 28 — ABNORMAL HIGH
Prothrombin Time: 29.1 — ABNORMAL HIGH
Prothrombin Time: 30.1 — ABNORMAL HIGH
Prothrombin Time: 31 — ABNORMAL HIGH

## 2011-04-23 LAB — COMPREHENSIVE METABOLIC PANEL
ALT: 28
AST: 31
Albumin: 4.4
Alkaline Phosphatase: 82
BUN: 14
CO2: 26
Calcium: 9.6
Chloride: 104
Creatinine, Ser: 0.84
GFR calc Af Amer: >60
GFR calc non Af Amer: >60
Glucose, Bld: 145 — ABNORMAL HIGH
Potassium: 3.7
Sodium: 139
Total Bilirubin: 0.5
Total Protein: 7.6

## 2011-04-23 LAB — BASIC METABOLIC PANEL
BUN: 13
BUN: 14
CO2: 25
CO2: 26
Calcium: 8.8
Calcium: 8.8
Chloride: 105
Chloride: 106
Creatinine, Ser: 0.79
Creatinine, Ser: 0.94
GFR calc Af Amer: >60
GFR calc Af Amer: >60
GFR calc non Af Amer: >60
GFR calc non Af Amer: >60
Glucose, Bld: 117 — ABNORMAL HIGH
Glucose, Bld: 121 — ABNORMAL HIGH
Potassium: 3.8
Potassium: 4
Sodium: 138
Sodium: 139

## 2011-04-23 LAB — HEPARIN LEVEL (UNFRACTIONATED)
Heparin Unfractionated: 0.3
Heparin Unfractionated: 0.34
Heparin Unfractionated: 0.39
Heparin Unfractionated: 0.46

## 2011-04-23 LAB — LIPID PANEL
Cholesterol: 171
HDL: 42
LDL Cholesterol: 84
Total CHOL/HDL Ratio: 4.1
Triglycerides: 226 — ABNORMAL HIGH
VLDL: 45 — ABNORMAL HIGH

## 2011-04-23 LAB — APTT
aPTT: 45 — ABNORMAL HIGH
aPTT: 45 — ABNORMAL HIGH

## 2011-04-23 LAB — CARDIAC PANEL(CRET KIN+CKTOT+MB+TROPI)
CK, MB: 0.8
CK, MB: 1.1
CK, MB: 1.4
Relative Index: INVALID
Relative Index: INVALID
Relative Index: INVALID
Total CK: 38
Total CK: 55
Total CK: 60
Troponin I: 0.01
Troponin I: 0.02
Troponin I: 0.03

## 2011-04-23 LAB — MAGNESIUM
Magnesium: 2
Magnesium: 2.1

## 2011-04-23 LAB — GLUCOSE, CAPILLARY: Glucose-Capillary: 171 — ABNORMAL HIGH

## 2011-04-23 LAB — B-NATRIURETIC PEPTIDE (CONVERTED LAB): Pro B Natriuretic peptide (BNP): 309 — ABNORMAL HIGH

## 2011-04-23 LAB — TSH: TSH: 1.978

## 2011-04-25 LAB — PROTIME-INR
INR: 1.1
INR: 1.2
INR: 1.3
Prothrombin Time: 14.7
Prothrombin Time: 15.1
Prothrombin Time: 16.2 — ABNORMAL HIGH

## 2011-04-25 LAB — COMPREHENSIVE METABOLIC PANEL
ALT: 48
ALT: 63 — ABNORMAL HIGH
AST: 42 — ABNORMAL HIGH
AST: 45 — ABNORMAL HIGH
Albumin: 3.5
Albumin: 4
Alkaline Phosphatase: 66
Alkaline Phosphatase: 77
BUN: 12
BUN: 12
CO2: 22
CO2: 23
Calcium: 9
Calcium: 9.2
Chloride: 108
Chloride: 109
Creatinine, Ser: 0.82
Creatinine, Ser: 0.93
GFR calc Af Amer: >60
GFR calc Af Amer: >60
GFR calc non Af Amer: >60
GFR calc non Af Amer: >60
Glucose, Bld: 115 — ABNORMAL HIGH
Glucose, Bld: 96
Potassium: 3.7
Potassium: 3.9
Sodium: 140
Sodium: 141
Total Bilirubin: 0.9
Total Bilirubin: 1.2
Total Protein: 6.8
Total Protein: 6.9

## 2011-04-25 LAB — DIFFERENTIAL
Basophils Absolute: 0
Basophils Relative: 1
Eosinophils Absolute: 0.1
Eosinophils Relative: 1
Lymphocytes Relative: 28
Lymphs Abs: 2.2
Monocytes Absolute: 0.8
Monocytes Relative: 10
Neutro Abs: 4.9
Neutrophils Relative %: 61

## 2011-04-25 LAB — CBC
HCT: 40.3
HCT: 41
HCT: 41.7
HCT: 43.4
Hemoglobin: 13.4
Hemoglobin: 13.5
Hemoglobin: 14
Hemoglobin: 14.4
MCHC: 33
MCHC: 33.1
MCHC: 33.2
MCHC: 33.7
MCV: 89.1
MCV: 90.3
MCV: 90.4
MCV: 90.6
Platelets: 193
Platelets: 199
Platelets: 213
Platelets: 215
RBC: 4.45
RBC: 4.54
RBC: 4.68
RBC: 4.8
RDW: 13.8
RDW: 14.2
RDW: 14.2
RDW: 14.6
WBC: 7.6
WBC: 7.8
WBC: 8.1
WBC: 8.2

## 2011-04-25 LAB — HEMOGLOBIN A1C
Hgb A1c MFr Bld: 6.2 — ABNORMAL HIGH
Mean Plasma Glucose: 131

## 2011-04-25 LAB — POCT CARDIAC MARKERS
CKMB, poc: <1 — ABNORMAL LOW
Myoglobin, poc: 37.4
Troponin i, poc: <0.05

## 2011-04-25 LAB — URINALYSIS, ROUTINE W REFLEX MICROSCOPIC
Bilirubin Urine: NEGATIVE
Glucose, UA: NEGATIVE
Hgb urine dipstick: NEGATIVE
Ketones, ur: NEGATIVE
Nitrite: NEGATIVE
Protein, ur: NEGATIVE
Specific Gravity, Urine: 1.019
Urobilinogen, UA: 0.2
pH: 6.5

## 2011-04-25 LAB — TROPONIN I: Troponin I: 0.01

## 2011-04-25 LAB — APTT: aPTT: 32

## 2011-04-25 LAB — LIPID PANEL
Cholesterol: 194
HDL: 34 — ABNORMAL LOW
LDL Cholesterol: 125 — ABNORMAL HIGH
Total CHOL/HDL Ratio: 5.7
Triglycerides: 176 — ABNORMAL HIGH
VLDL: 35

## 2011-04-25 LAB — HOMOCYSTEINE: Homocysteine: 8

## 2011-04-25 LAB — RAPID URINE DRUG SCREEN, HOSP PERFORMED
Amphetamines: NOT DETECTED
Barbiturates: NOT DETECTED
Benzodiazepines: NOT DETECTED
Cocaine: NOT DETECTED
Opiates: NOT DETECTED
Tetrahydrocannabinol: NOT DETECTED

## 2011-04-25 LAB — HEPARIN LEVEL (UNFRACTIONATED)
Heparin Unfractionated: 0.18 — ABNORMAL LOW
Heparin Unfractionated: 0.21 — ABNORMAL LOW
Heparin Unfractionated: 0.24 — ABNORMAL LOW
Heparin Unfractionated: 0.26 — ABNORMAL LOW
Heparin Unfractionated: 0.33
Heparin Unfractionated: 0.39

## 2011-04-25 LAB — TSH: TSH: 1.469

## 2011-04-25 LAB — GLUCOSE, CAPILLARY: Glucose-Capillary: 124 — ABNORMAL HIGH

## 2011-04-25 LAB — CK TOTAL AND CKMB (NOT AT ARMC)
CK, MB: 1
Relative Index: INVALID
Total CK: 51

## 2011-05-15 ENCOUNTER — Other Ambulatory Visit: Payer: Self-pay | Admitting: Internal Medicine

## 2011-05-15 ENCOUNTER — Telehealth: Payer: Self-pay | Admitting: Medical

## 2011-05-15 NOTE — Telephone Encounter (Signed)
rx was fax over to the pharmacy. cls

## 2011-06-04 ENCOUNTER — Encounter (INDEPENDENT_AMBULATORY_CARE_PROVIDER_SITE_OTHER): Payer: Medicaid Other | Admitting: Ophthalmology

## 2011-06-18 ENCOUNTER — Encounter (INDEPENDENT_AMBULATORY_CARE_PROVIDER_SITE_OTHER): Payer: Medicaid Other | Admitting: Ophthalmology

## 2011-06-18 DIAGNOSIS — H348192 Central retinal vein occlusion, unspecified eye, stable: Secondary | ICD-10-CM

## 2011-06-18 DIAGNOSIS — I10 Essential (primary) hypertension: Secondary | ICD-10-CM

## 2011-06-18 DIAGNOSIS — H35039 Hypertensive retinopathy, unspecified eye: Secondary | ICD-10-CM

## 2011-06-18 DIAGNOSIS — E1139 Type 2 diabetes mellitus with other diabetic ophthalmic complication: Secondary | ICD-10-CM

## 2011-07-02 ENCOUNTER — Ambulatory Visit (INDEPENDENT_AMBULATORY_CARE_PROVIDER_SITE_OTHER): Payer: Medicaid Other | Admitting: Medical

## 2011-07-02 ENCOUNTER — Encounter: Payer: Self-pay | Admitting: Medical

## 2011-07-02 VITALS — BP 130/70 | HR 80 | Temp 97.6°F | Resp 16 | Wt 304.0 lb

## 2011-07-02 DIAGNOSIS — L918 Other hypertrophic disorders of the skin: Secondary | ICD-10-CM

## 2011-07-02 DIAGNOSIS — E119 Type 2 diabetes mellitus without complications: Secondary | ICD-10-CM

## 2011-07-02 DIAGNOSIS — E291 Testicular hypofunction: Secondary | ICD-10-CM

## 2011-07-02 DIAGNOSIS — L909 Atrophic disorder of skin, unspecified: Secondary | ICD-10-CM

## 2011-07-02 DIAGNOSIS — E118 Type 2 diabetes mellitus with unspecified complications: Secondary | ICD-10-CM | POA: Insufficient documentation

## 2011-07-02 DIAGNOSIS — I10 Essential (primary) hypertension: Secondary | ICD-10-CM | POA: Insufficient documentation

## 2011-07-02 DIAGNOSIS — E785 Hyperlipidemia, unspecified: Secondary | ICD-10-CM

## 2011-07-02 DIAGNOSIS — E669 Obesity, unspecified: Secondary | ICD-10-CM

## 2011-07-02 LAB — POCT GLYCOSYLATED HEMOGLOBIN (HGB A1C): Hemoglobin A1C: 6.8

## 2011-07-02 NOTE — Progress Notes (Signed)
Subjective:   HPI  [name] is a 60 y.o. male who presents for diabetes f/u.  He is followed by cardiology for blood pressure and cholesterol management.  He is followed by Urology for hypogonadism, on testosterone.  He has seen both in the last 60mo. He is checking glucose regularly, seeing numbers uner 130 fasting in the morning.  He has been drinking frapacino recently.  He isn't exercising as much as he was last visit.   He denies hypoglycemia.  He is compliant with medication.  He has numerous skin lesions along his bilat axilla he would like removed. No other aggravating or relieving factors.    No other c/o.  The following portions of the patient's history were reviewed and updated as appropriate: allergies, current medications, past family history, past medical history, past social history, past surgical history and problem list.  Past Medical History  Diagnosis Date  . Persistent atrial fibrillation     s/p afib ablation by [facility] 08/01/08  . Cerebrovascular accident     secondary to cardioembolic event Sept 2009  . Central retinal artery occlusion, left eye     diagnosed in 2008  . Hypertension   . Hyperlipidemia   . Obstructive sleep apnea   . Morbid obesity   . Skin infection     chronic left leg herpetic  . Bell's palsy   . CAD (coronary artery disease)     chronically occluded RCA  . Atrial flutter     atypical atrial flutter post PVI  . Diabetes mellitus 11/14/2010    TYPE II  . Obesity   . CHF (congestive heart failure)     history of  . Hypogonadism, male     Review of Systems Constitutional: -fever, -chills, -sweats, -unexpected -weight change,-fatigue ENT: -runny nose, -ear pain, -sore throat Cardiology:  -chest pain, -palpitations, -edema Respiratory: -cough, -shortness of breath, -wheezing Gastroenterology: -abdominal pain, -nausea, -vomiting, -diarrhea, -constipation Hematology: -bleeding or bruising problems Musculoskeletal: -arthralgias, -myalgias,  -joint swelling, -back pain Ophthalmology: -vision changes Urology: -dysuria, -difficulty urinating, -hematuria, -urinary frequency, -urgency Neurology: -headache, -weakness, -tingling, -numbness     Objective:   Physical Exam  Filed Vitals:   07/02/11 0902  BP: 130/70  Pulse: 80  Temp: 97.6 F (36.4 C)  Resp: 16    General appearance: alert, no distress, WD/WN Skin: multiple several fleshly, flesh colored mostly pedunculated lesions of bilat axilla Neck: supple, no lymphadenopathy, no thyromegaly, no masses Heart: RRR, normal S1, S2, no murmurs Lungs: CTA bilaterally, no wheezes, rhonchi, or rales Abdomen: +bs, soft, non tender, non distended, no masses, no hepatomegaly, no splenomegaly Pulses: 2+ symmetric, upper and lower extremities, normal cap refill   Assessment and Plan :       Encounter Diagnoses  Name Primary?  . Type II or unspecified type diabetes mellitus without mention of complication, not stated as uncontrolled Yes  . Skin tag   . Obesity   . Essential hypertension, benign   . Hyperlipidemia   . Hypogonadism male    Diabetes type II - hemoglobin A1C today 6.8%.  Need to work on diet and exercise changes.  C/t same medication, recheck 3-4 mo.   Skin tags - discussed risks/benefits of procedure.  Cleaned and prepped area in usual sterile fashion.  Used scissors to excise several smaller lesions of bilat axilla, used 1% lidocaine without epi for the bigger lesions, and excised those as well.  All were benign appearing.  Used silver nitrate to achieve hemostasis.  Discussed  wound care.   Obesity - discussed efforts to improve on his diet, get back to exercising, and work towards goals.  HTN and hyperlipidemia - followed by cardiology  Hypogonadism - c/t testosterone therapy, f/u with Urology as planned  Follow-up 3-4 mo

## 2011-08-08 ENCOUNTER — Other Ambulatory Visit: Payer: Self-pay | Admitting: Medical

## 2011-08-08 ENCOUNTER — Telehealth: Payer: Self-pay | Admitting: *Deleted

## 2011-08-08 MED ORDER — VITAMIN D3 1.25 MG (50000 UT) PO CAPS: 1.0000 | ORAL_CAPSULE | ORAL | Status: DC

## 2011-08-08 NOTE — Telephone Encounter (Signed)
pls have him come by for Vit D lab level, RE: hx/o vitamin D deficiency.  He may be just fine taking OTC Vit D, but lets get a level before I send additional refills.  I did go ahead and send 30 day supply.

## 2011-08-08 NOTE — Telephone Encounter (Signed)
Patient would like a refill on his vitamin D 50,000 IU. Wasn't sure if he needed to continue on this med. Would you like him to schedule a lab visit to check vitamin D level? Please advise. Thanks.

## 2011-08-09 ENCOUNTER — Telehealth: Payer: Self-pay | Admitting: *Deleted

## 2011-08-09 NOTE — Telephone Encounter (Signed)
Left message for patient that lets him know that a 30 day supply of vitamin D was sent to the pharmacy, but he needs to call office and schedule a lab visit just to check vitamin D level.

## 2011-09-20 ENCOUNTER — Other Ambulatory Visit: Payer: Self-pay | Admitting: Medical

## 2011-10-01 ENCOUNTER — Encounter: Payer: Self-pay | Admitting: Internal Medicine

## 2011-10-08 ENCOUNTER — Other Ambulatory Visit: Payer: Self-pay | Admitting: Family Medicine

## 2011-10-09 ENCOUNTER — Ambulatory Visit (INDEPENDENT_AMBULATORY_CARE_PROVIDER_SITE_OTHER): Payer: Medicaid Other | Admitting: Medical

## 2011-10-09 ENCOUNTER — Encounter: Payer: Self-pay | Admitting: Medical

## 2011-10-09 VITALS — BP 122/80 | HR 92 | Temp 97.7°F | Resp 18 | Wt 306.0 lb

## 2011-10-09 DIAGNOSIS — E559 Vitamin D deficiency, unspecified: Secondary | ICD-10-CM

## 2011-10-09 DIAGNOSIS — E119 Type 2 diabetes mellitus without complications: Secondary | ICD-10-CM

## 2011-10-09 LAB — HEPATIC FUNCTION PANEL
ALT: 25 U/L (ref 0–53)
AST: 30 U/L (ref 0–37)
Albumin: 4.1 g/dL (ref 3.5–5.2)
Alkaline Phosphatase: 60 U/L (ref 39–117)
Bilirubin, Direct: 0.1 mg/dL (ref 0.0–0.3)
Indirect Bilirubin: 0.5 mg/dL (ref 0.0–0.9)
Total Bilirubin: 0.6 mg/dL (ref 0.3–1.2)
Total Protein: 6.6 g/dL (ref 6.0–8.3)

## 2011-10-09 NOTE — Progress Notes (Signed)
Subjective: Here for diabetes f/u and check on vitamin D.  He is eating the same diet as usual, walking for exercise.  Checks glucose a few times daily, and sugars usually "normal" but he doesn't have exact figures. Highest ever is 157.   He checks his feet daily.  He tends to eat and snack between 12pm and 2 am.    He is followed by several doctors including Dr. Eskew/urology for PSA and low testosterone.  Followed by cardiology for coumadin, Afib, HTN, hyperlipidemia.  Followed by Dr. Matthews/ophthalmology.  Followed by neurology for hx/o CVA.  He mainly comes here for acute issues and diabetes management.  He notes being up to date on f/u with his other doctors.  He is on CPAP for sleep apnea.  Colonoscopy 2010.  He declines all vaccines.  No other new c/o today.  Past Medical History  Diagnosis Date  . Persistent atrial fibrillation     s/p afib ablation by [facility] 08/01/08  . Cerebrovascular accident     secondary to cardioembolic event Sept 2009  . Central retinal artery occlusion, left eye     diagnosed in 2008  . Hypertension   . Hyperlipidemia   . Obstructive sleep apnea   . Morbid obesity   . Skin infection     chronic left leg herpetic  . Bell's palsy   . CAD (coronary artery disease)     chronically occluded RCA  . Atrial flutter     atypical atrial flutter post PVI  . Diabetes mellitus 11/14/2010    TYPE II  . Obesity   . CHF (congestive heart failure)     history of  . Hypogonadism, male    ROS - negative    Objective:   Physical Exam  Filed Vitals:   10/09/11 0828  BP: 122/80  Pulse: 92  Temp: 97.7 F (36.5 C)  Resp: 18    General appearance: alert, no distress, WD/WN, obese  Oral cavity: MMM, no lesions Neck: supple, no lymphadenopathy, no thyromegaly, no masses Heart: RRR, normal S1, S2, no murmurs Lungs: CTA bilaterally, no wheezes, rhonchi, or rales Abdomen: +bs, soft, non tender, non distended, no masses, no hepatomegaly, no splenomegaly Pulses: 2+  symmetric Ext: no edema  Assessment and Plan :    Encounter Diagnoses  Name Primary?  . Type II or unspecified type diabetes mellitus without mention of complication, not stated as uncontrolled Yes  . Vitamin d deficiency    HgbA1C today, liver profile and Vit D level.  We are requesting records from cardiology and ophthalmology as I don't have latest notes on file.     Addendum: Reviewed 07/05/11 cardiology note, reviewed most recent labs for lipids, liver, bmet.  He seems to be stable on current regimen.   Follow-up pending labs.  reviewed latest opthalmology notes from 06/18/11.

## 2011-10-09 NOTE — Patient Instructions (Signed)
See your cardiologist regularly for follow up.  Continue exercising with walking, but try and lose some weight through exercise and low fat diet.    Be careful not to eat too many carbohydrates such as breads, pasta, rice, crackers, potatoes and corn.  Limit these to 2-3 servings daily.    Be careful not to snack on too many calories in the late evening.   Eat plenty of vegetables including green vegetables.  Eat 2-3 servings of fruit daily.  Eat lean meat such as chicken, [place], and fish.   Avoid sweets, sweet tea, cola, fried foods and fast food.    We will call with your lab results.

## 2011-10-10 ENCOUNTER — Other Ambulatory Visit: Payer: Self-pay | Admitting: Medical

## 2011-10-10 LAB — VITAMIN D 25 HYDROXY (VIT D DEFICIENCY, FRACTURES): Vit D, 25-Hydroxy: 29 ng/mL — ABNORMAL LOW (ref 30–89)

## 2011-10-10 LAB — HEMOGLOBIN A1C
Hgb A1c MFr Bld: 7.2 % — ABNORMAL HIGH (ref <5.7)
Mean Plasma Glucose: 160 mg/dL — ABNORMAL HIGH (ref <117)

## 2011-10-10 MED ORDER — METFORMIN HCL 850 MG PO TABS: 850.0000 mg | ORAL_TABLET | Freq: Two times a day (BID) | ORAL | Status: DC

## 2011-11-02 ENCOUNTER — Other Ambulatory Visit: Payer: Self-pay | Admitting: Medical

## 2011-11-02 NOTE — Telephone Encounter (Signed)
Travis Hews do we need renew the RX for Vitamin D or was he suppose to get this OTC. CLS

## 2011-11-05 ENCOUNTER — Other Ambulatory Visit: Payer: Self-pay | Admitting: Medical

## 2011-11-05 MED ORDER — VITAMIN D3 1.25 MG (50000 UT) PO CAPS: 1.0000 | ORAL_CAPSULE | ORAL | Status: DC

## 2011-11-05 NOTE — Telephone Encounter (Signed)
PATIENTS WIFE WAS NOTIFIED OF HIS RX THAT WAS SENT TO THE PHARMACY. CLS

## 2011-11-05 NOTE — Telephone Encounter (Signed)
Lets just keep him on prescription Vit D for now, and next visit we will check lab value.  i sent refills

## 2011-11-07 ENCOUNTER — Ambulatory Visit: Payer: Medicaid Other | Admitting: Internal Medicine

## 2011-11-20 ENCOUNTER — Other Ambulatory Visit: Payer: Self-pay | Admitting: Internal Medicine

## 2011-11-20 MED ORDER — SOTALOL HCL 160 MG PO TABS: 160.0000 mg | ORAL_TABLET | Freq: Two times a day (BID) | ORAL | Status: DC

## 2011-11-21 ENCOUNTER — Encounter: Payer: Self-pay | Admitting: Cardiology

## 2011-12-19 ENCOUNTER — Ambulatory Visit (INDEPENDENT_AMBULATORY_CARE_PROVIDER_SITE_OTHER): Payer: Medicaid Other | Admitting: Internal Medicine

## 2011-12-19 ENCOUNTER — Encounter: Payer: Self-pay | Admitting: Internal Medicine

## 2011-12-19 VITALS — BP 138/72 | HR 70 | Resp 18 | Ht 71.0 in | Wt 301.1 lb

## 2011-12-19 DIAGNOSIS — G4730 Sleep apnea, unspecified: Secondary | ICD-10-CM

## 2011-12-19 DIAGNOSIS — I4891 Unspecified atrial fibrillation: Secondary | ICD-10-CM

## 2011-12-19 NOTE — Progress Notes (Signed)
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  Since last being seen in our clinic, the patient reports doing very well.  He has had no symptoms of afib.  Today, he denies symptoms of palpitations, chest pain, shortness of breath,  lower extremity edema, dizziness, presyncope, or syncope.  The patient is otherwise without complaint today.   Past Medical History  Diagnosis Date  . Persistent atrial fibrillation     s/p afib ablation by [facility] 08/01/08  . Cerebrovascular accident     secondary to cardioembolic event Sept 2009  . Central retinal artery occlusion, left eye     diagnosed in 2008  . Hypertension   . Hyperlipidemia   . Obstructive sleep apnea   . Morbid obesity   . Skin infection     chronic left leg herpetic  . Bell's palsy   . CAD (coronary artery disease)     chronically occluded RCA  . Atrial flutter     atypical atrial flutter post PVI  . Diabetes mellitus 11/14/2010    TYPE II  . Obesity   . CHF (congestive heart failure)     history of  . Hypogonadism, male    Past Surgical History  Procedure Date  . Atrial ablation surgery 08/01/08    CTI and PVI ablation by [facility]    Current Outpatient Prescriptions  Medication Sig Dispense Refill  . ACCU-CHEK AVIVA PLUS test strip USE TO TEST BLOOD SUGAR DAILY  50 each  4  . aspirin (ASPIR-81) 81 MG EC tablet Take 81 mg by mouth daily.        . Cholecalciferol (VITAMIN D3) 50000 UNITS CAPS Take 1 capsule by mouth once a week.  4 capsule  3  . ezetimibe (ZETIA) 10 MG tablet Take 10 mg by mouth daily.        . isosorbide mononitrate (IMDUR) 30 MG 24 hr tablet Take 30 mg by mouth daily.        . metFORMIN (GLUCOPHAGE) 850 MG tablet Take 1 tablet (850 mg total) by mouth 2 (two) times daily with a meal.  60 tablet  2  . metoprolol (LOPRESSOR) 50 MG tablet TAKE ONE TABLET BY MOUTH TWICE DAILY  60 tablet  6  . nitroGLYCERIN  (NITROSTAT) 0.4 MG SL tablet Place 0.4 mg under the tongue every 5 (five) minutes as needed.        . rosuvastatin (CRESTOR) 20 MG tablet Take 20 mg by mouth daily.        . sotalol (BETAPACE) 160 MG tablet Take 1 tablet (160 mg total) by mouth 2 (two) times daily.  60 tablet  3  . Testosterone (ANDROGEL PUMP) 1.25 GM/ACT (1%) GEL Place onto the skin.        . valACYclovir (VALTREX) 500 MG tablet TAKE ONE TABLET BY MOUTH EVERY DAY  30 tablet  prn  . valsartan (DIOVAN) 160 MG tablet Take 1 tablet (160 mg total) by mouth daily.  30 each  5  . warfarin (COUMADIN) 5 MG tablet Take 5 mg by mouth as directed.          Physical Exam: Filed Vitals:   12/19/11 1435  BP: 138/72  Pulse: 70  Resp: 18  Height: 5\' 11"  (1.803 m)  Weight: 301 lb 1.9 oz (136.587 kg)    GEN- The patient is well appearing, alert and oriented x 3 today.  Head- normocephalic, atraumatic Eyes-  Sclera clear, conjunctiva pink Ears- hearing intact Oropharynx- clear Lungs- Clear to ausculation bilaterally, normal work of breathing Heart- Regular rate and rhythm, no murmurs, rubs or gallops, PMI not laterally displaced GI- soft, NT, ND, + BS Extremities- no clubbing, cyanosis, or edema  ekg today reveals sinus rhythm 70 bpm, septal infarct  Assessment and Plan:

## 2011-12-19 NOTE — Assessment & Plan Note (Signed)
Compliance with CPAP encouraged 

## 2011-12-19 NOTE — Assessment & Plan Note (Signed)
Doing well without any afib Continue current medicines I have recommended that he switch from coumadin to pradaxa given superior reduction in stroke and reduced intercranial bleed with pradaxa.  With medicaid, I would anticipate that this medicine would be very affordable for him. He will discuss with Dr Mayford Knife when he sees her in June.  No changes today

## 2011-12-19 NOTE — Assessment & Plan Note (Signed)
Weight loss advised 

## 2011-12-19 NOTE — Patient Instructions (Signed)
Your physician wants you to follow-up in: 12 months with Dr Allred You will receive a reminder letter in the mail two months in advance. If you don't receive a letter, please call our office to schedule the follow-up appointment.  

## 2011-12-25 NOTE — Progress Notes (Signed)
Addended by: Reine Just on: 12/25/2011 08:00 PM   Modules accepted: Orders

## 2012-01-18 ENCOUNTER — Encounter: Payer: Self-pay | Admitting: Medical

## 2012-01-18 ENCOUNTER — Ambulatory Visit (INDEPENDENT_AMBULATORY_CARE_PROVIDER_SITE_OTHER): Payer: Medicaid Other | Admitting: Medical

## 2012-01-18 VITALS — BP 120/70 | HR 82 | Temp 98.0°F | Resp 16 | Wt 299.0 lb

## 2012-01-18 DIAGNOSIS — K429 Umbilical hernia without obstruction or gangrene: Secondary | ICD-10-CM

## 2012-01-18 MED ORDER — HYDROCODONE-ACETAMINOPHEN 5-500 MG PO TABS: 1.0000 | ORAL_TABLET | Freq: Four times a day (QID) | ORAL | Status: AC | PRN

## 2012-01-18 MED ORDER — METFORMIN HCL 850 MG PO TABS: 850.0000 mg | ORAL_TABLET | Freq: Two times a day (BID) | ORAL | Status: DC

## 2012-01-18 NOTE — Progress Notes (Signed)
Subjective:   HPI  [name] is a 61 y.o. male who presents for possible hernia and abdominal pain. He was on a riding mower 3 wk ago when he started to tip over while mowing on a hillside.  He got stuck in the mud.  He tried to lift the mower and had immediate pain in the lower abdomen around the belly button.  The pain lasted 3 days, but then went away.  Did fine for about 2.5 weeks until he was at the grocery store and while lifting a watermelon out of the bin at the store, felt the same kind of pain again in the belly button.  His abdomen was also leaning against the crate. He had bad pain last night but feels ok today.  No other aggravating or relieving factors.    No other c/o.  The following portions of the patient's history were reviewed and updated as appropriate: allergies, current medications, past family history, past medical history, past social history, past surgical history and problem list.  Past Medical History  Diagnosis Date  . Persistent atrial fibrillation     s/p afib ablation by JA 08/01/08  . Cerebrovascular accident     secondary to cardioembolic event Sept 2009  . Central retinal artery occlusion, left eye     diagnosed in 2008  . Hypertension   . Hyperlipidemia   . Obstructive sleep apnea   . Morbid obesity   . Skin infection     chronic left leg herpetic  . Bell's palsy   . CAD (coronary artery disease)     chronically occluded RCA  . Atrial flutter     atypical atrial flutter post PVI  . Diabetes mellitus 11/14/2010    TYPE II  . Obesity   . CHF (congestive heart failure)     history of  . Hypogonadism, male     No Known Allergies   Review of Systems ROS reviewed and was negative other than noted in HPI or above.    Objective:   Physical Exam  General appearance: alert, no distress, WD/WN Abdomen: +bs, soft, obese abdomen, umbilicus with 2.5 cm reducible hernia, non tender to palpitation GU: no obvious hernia   Assessment and Plan :     Encounter Diagnosis  Name Primary?  . Umbilical hernia Yes   Discussed diagnosis.  Discussed possible complications.  Script for Lortab prn use, but advised if severe pain, call or go to the ED if 10/10.  Otherwise if pain on a regular basis, then we will need to get surgery consultation.  Otherwise as long as he avoids heavy lifting, uses proper lifting technique, this may not continue to be a problem.

## 2012-02-04 ENCOUNTER — Other Ambulatory Visit: Payer: Self-pay | Admitting: Family Medicine

## 2012-03-04 ENCOUNTER — Other Ambulatory Visit: Payer: Self-pay | Admitting: Medical

## 2012-03-04 NOTE — Telephone Encounter (Signed)
Rx refill

## 2012-03-17 ENCOUNTER — Ambulatory Visit (INDEPENDENT_AMBULATORY_CARE_PROVIDER_SITE_OTHER): Payer: Medicaid Other | Admitting: Ophthalmology

## 2012-04-07 ENCOUNTER — Other Ambulatory Visit: Payer: Self-pay | Admitting: Internal Medicine

## 2012-04-07 MED ORDER — SOTALOL HCL 160 MG PO TABS: 160.0000 mg | ORAL_TABLET | Freq: Two times a day (BID) | ORAL | Status: DC

## 2012-05-12 ENCOUNTER — Other Ambulatory Visit: Payer: Self-pay | Admitting: Medical

## 2012-05-12 NOTE — Telephone Encounter (Signed)
PATIENT NEEDS TO SCHEDULE A OFFICE VISIT BEFORE HIS MEDICATION RUNS OUT. 

## 2012-06-10 ENCOUNTER — Other Ambulatory Visit: Payer: Self-pay | Admitting: Medical

## 2012-07-19 ENCOUNTER — Other Ambulatory Visit: Payer: Self-pay | Admitting: Medical

## 2012-07-29 ENCOUNTER — Encounter: Payer: Self-pay | Admitting: Medical

## 2012-07-29 ENCOUNTER — Ambulatory Visit (INDEPENDENT_AMBULATORY_CARE_PROVIDER_SITE_OTHER): Payer: Medicaid Other | Admitting: Medical

## 2012-07-29 VITALS — BP 142/92 | HR 62 | Temp 98.1°F | Resp 18 | Wt 289.0 lb

## 2012-07-29 DIAGNOSIS — R509 Fever, unspecified: Secondary | ICD-10-CM

## 2012-07-29 DIAGNOSIS — R05 Cough: Secondary | ICD-10-CM

## 2012-07-29 DIAGNOSIS — R0602 Shortness of breath: Secondary | ICD-10-CM

## 2012-07-29 DIAGNOSIS — R059 Cough, unspecified: Secondary | ICD-10-CM

## 2012-07-29 MED ORDER — AMOXICILLIN-POT CLAVULANATE 875-125 MG PO TABS: 1.0000 | ORAL_TABLET | Freq: Two times a day (BID) | ORAL | Status: DC

## 2012-07-29 NOTE — Progress Notes (Signed)
Subjective:  [name] is a 62 y.o. male who presents for illness x 2-3 days, hit him like a ton of bricks.  He reports horrible sore throat, cough, runny nose like a faucet, nasal congestion, yellow nasal drainage, some productive cough, subjective fever, body aches, nausea, but no vomiting, no chills, no worse SOB than usual, no chest pain.  No ear pain.  Using Tylenol and Mucinex.  No other aggravating or relieving factors.  No other c/o.  The following portions of the patient's history were reviewed and updated as appropriate: allergies, current medications, past family history, past medical history, past social history, past surgical history and problem list.  Past Medical History  Diagnosis Date  . Persistent atrial fibrillation     s/p afib ablation by JA 08/01/08  . Cerebrovascular accident     secondary to cardioembolic event Sept 2009  . Central retinal artery occlusion, left eye     diagnosed in 2008  . Hypertension   . Hyperlipidemia   . Obstructive sleep apnea   . Morbid obesity   . Skin infection     chronic left leg herpetic  . Bell's palsy   . CAD (coronary artery disease)     chronically occluded RCA  . Atrial flutter     atypical atrial flutter post PVI  . Diabetes mellitus 11/14/2010    TYPE II  . Obesity   . CHF (congestive heart failure)     history of  . Hypogonadism, male    ROS as in subjective  Objective:   Filed Vitals:   07/29/12 0948  BP: 142/92  Pulse: 62  Temp: 98.1 F (36.7 C)  Resp: 18    General appearance: Alert, WD/WN, no distress, ill appearing                             Skin: warm, no rash, no diaphoresis                           Head: mild maxillary sinus tenderness                            Eyes: conjunctiva normal, corneas clear, PERRLA                            Ears: pearly TMs, external ear canals normal                          Nose: septum midline, turbinates swollen, with erythema and clear discharge  Mouth/throat: MMM, tongue normal, mild pharyngeal erythema                           Neck: supple, no adenopathy, no thyromegaly, nontender                          Heart: RRR, normal S1, S2, no murmurs                         Lungs: +rhonchi and decreased breath sounds left lower field, no wheezes, no rales                Extremities: no edema, nontender    Chest xray  with possible early infiltrate in left lower lung field given blunting of left costovertebral angle.  otherwise no other obvious mass, infiltrate, or consolidation.  Cardiac silhouette mildly enlarged.  No other acute change.  reviewed xray with Dr. Susann Givens supervising physician.   Assessment and Plan:   Encounter Diagnoses  Name Primary?  . Cough Yes  . Fever   . Shortness of breath    symptoms suggest that he may have flu like syndrome with possible secondary infiltrative process starting, but flu test negative today.  Begin Augmentin, rest, hydrate well, can c/t Mucinex OTC, Tylenol OTC for fever and malaise.  Call/return in 2-3 days if symptoms are worse or not improving.  If not much improved in a week, then recheck.

## 2012-08-05 ENCOUNTER — Encounter: Payer: Self-pay | Admitting: Medical

## 2012-08-10 ENCOUNTER — Other Ambulatory Visit: Payer: Self-pay | Admitting: Medical

## 2012-08-11 NOTE — Telephone Encounter (Signed)
Is this ok?

## 2012-08-11 NOTE — Telephone Encounter (Signed)
Due back for routine diabetes f/u

## 2012-08-13 NOTE — Telephone Encounter (Signed)
I left a message on the patients voicemail in reference to making an appointment. CLS

## 2012-09-12 ENCOUNTER — Other Ambulatory Visit: Payer: Self-pay | Admitting: Medical

## 2012-10-08 ENCOUNTER — Other Ambulatory Visit: Payer: Self-pay | Admitting: Medical

## 2012-10-08 NOTE — Telephone Encounter (Signed)
[name] can I refill these medications? His last labs was 09/2011. CLS

## 2012-10-09 ENCOUNTER — Other Ambulatory Visit: Payer: Self-pay | Admitting: Cardiology

## 2012-10-09 MED ORDER — SOTALOL HCL 160 MG PO TABS: 160.0000 mg | ORAL_TABLET | Freq: Two times a day (BID) | ORAL | Status: DC

## 2012-10-09 NOTE — Telephone Encounter (Signed)
Due for OV, fasting, .

## 2012-10-10 NOTE — Telephone Encounter (Signed)
I left a detailed message for the patient to call and schedule a OV, fasting for 30 minutes. CLS

## 2012-11-13 ENCOUNTER — Other Ambulatory Visit: Payer: Self-pay | Admitting: Medical

## 2012-11-17 ENCOUNTER — Other Ambulatory Visit: Payer: Self-pay | Admitting: Medical

## 2012-11-20 ENCOUNTER — Other Ambulatory Visit: Payer: Self-pay | Admitting: Medical

## 2012-11-20 ENCOUNTER — Other Ambulatory Visit: Payer: Self-pay | Admitting: Family Medicine

## 2012-11-25 ENCOUNTER — Other Ambulatory Visit: Payer: Self-pay | Admitting: Medical

## 2012-12-01 ENCOUNTER — Encounter: Payer: Self-pay | Admitting: Medical

## 2012-12-01 ENCOUNTER — Ambulatory Visit (INDEPENDENT_AMBULATORY_CARE_PROVIDER_SITE_OTHER): Payer: Medicaid Other | Admitting: Medical

## 2012-12-01 VITALS — BP 150/90 | HR 92 | Temp 97.8°F | Resp 16 | Wt 293.0 lb

## 2012-12-01 DIAGNOSIS — E669 Obesity, unspecified: Secondary | ICD-10-CM

## 2012-12-01 DIAGNOSIS — R0602 Shortness of breath: Secondary | ICD-10-CM

## 2012-12-01 DIAGNOSIS — E785 Hyperlipidemia, unspecified: Secondary | ICD-10-CM

## 2012-12-01 DIAGNOSIS — I10 Essential (primary) hypertension: Secondary | ICD-10-CM

## 2012-12-01 DIAGNOSIS — E119 Type 2 diabetes mellitus without complications: Secondary | ICD-10-CM

## 2012-12-01 NOTE — Progress Notes (Signed)
Subjective:    [name] is a 62 y.o. male who presents for follow-up of Type 2 diabetes mellitus.    Home blood sugar records: fasting range: morning numbers in 100-120.   Lowest numbers in the 90s.  Sometimes high numbers if eats late night snack.    Current symptoms/problems include none and have been unchanged. Daily foot checks, foot concerns: checks daily Last eye exam:   About 27mo ago   Medication compliance:compliant. Current diet: more sandwiches of late, eat a lot of vegetables.   does eat some meat, chicdken, fish, beef. Eats at [facility] at least 3 times weekly, enjoys his sweet tea Current exercise: walking, son is now riding bike without training wheels  Cardiology - last visit within last 40mo.  crestor was stopped, changed to Lipitor due to insurance issues.  No other changes.  Has f/u soon.   Cardiology manages his lipids.    Sleep apnea - uses CPAP.   Managed by cardiology.   Urology - saw in 05/2012, on testosterone therapy.   The following portions of the patient's history were reviewed and updated as appropriate: allergies, current medications, past family history, past medical history, past social history, past surgical history and problem list.  ROS as in subjective above    Objective:    BP 150/90  Pulse 92  Temp(Src) 97.8 F (36.6 C) (Oral)  Resp 16  Wt 293 lb (132.904 kg)  BMI 40.88 kg/m2  Filed Vitals:   12/01/12 1016  BP: 150/90  Pulse: 92  Temp: 97.8 F (36.6 C)  Resp: 16    General appearance: alert, no distress, WD/WN Neck: supple, no lymphadenopathy, no thyromegaly, no masses Heart: RRR, normal S1, S2, no murmurs Lungs: CTA bilaterally, no wheezes, rhonchi, or rales Abdomen: +bs, soft, non tender, non distended, no masses, no hepatomegaly, no splenomegaly Pulses: 2+ symmetric, upper and lower extremities, normal cap refill Ext: no edema Foot exam:  Neuro: foot monofilament exam normal   Lab Review Lab Results  Component  Value Date   HGBA1C 7.2* 10/09/2011   Lab Results  Component Value Date   CHOL  Value: 171        ATP III CLASSIFICATION:  <200     mg/dL   Desirable  295-621  mg/dL   Borderline High  >=308    mg/dL   High 65/78/4696   HDL 42 05/05/2008   LDLCALC  Value: 84        Total Cholesterol/HDL:CHD Risk Coronary Heart Disease Risk Table                     Men   Women  1/2 Average Risk   3.4   3.3 05/05/2008   TRIG 226* 05/05/2008   CHOLHDL 4.1 05/05/2008   Lab Results  Component Value Date   MICROALBUR 2.64* 02/07/2011     Chemistry      Component Value Date/Time   NA 140 02/07/2011 0942   K 4.3 02/07/2011 0942   CL 104 02/07/2011 0942   CO2 23 02/07/2011 0942   BUN 21 02/07/2011 0942   CREATININE 0.82 02/07/2011 0942   CREATININE 0.80 08/16/2009 0353      Component Value Date/Time   CALCIUM 9.0 02/07/2011 0942   ALKPHOS 60 10/09/2011 0918   AST 30 10/09/2011 0918   ALT 25 10/09/2011 0918   BILITOT 0.6 10/09/2011 0918        Chemistry      Component Value Date/Time  NA 140 02/07/2011 0942   K 4.3 02/07/2011 0942   CL 104 02/07/2011 0942   CO2 23 02/07/2011 0942   BUN 21 02/07/2011 0942   CREATININE 0.82 02/07/2011 0942   CREATININE 0.80 08/16/2009 0353      Component Value Date/Time   CALCIUM 9.0 02/07/2011 0942   ALKPHOS 60 10/09/2011 0918   AST 30 10/09/2011 0918   ALT 25 10/09/2011 0918   BILITOT 0.6 10/09/2011 0918       Assessment:   Encounter Diagnoses  Name Primary?  . Type II or unspecified type diabetes mellitus without mention of complication, not stated as uncontrolled Yes  . Hyperlipidemia   . Essential hypertension, benign   . Obesity, unspecified   . Shortness of breath     Plan:    1.  Rx changes: none 2.  Education: Reviewed 'ABCs' of diabetes management (respective goals in parentheses):  A1C (<7), blood pressure (<130/80), and cholesterol (LDL <100). 3.  Compliance at present is estimated to be fair. Efforts to improve compliance (if necessary) will be  directed at dietary modifications: need to cut out the sweet tea, [employer], fried foods; needs to be eating diabetic diet. and increased exercise. 4. Follow up: pending labs .

## 2012-12-02 ENCOUNTER — Encounter: Payer: Self-pay | Admitting: Family Medicine

## 2012-12-02 LAB — CBC WITH DIFFERENTIAL/PLATELET
Basophils Absolute: 0 10*3/uL (ref 0.0–0.1)
Basophils Relative: 1 % (ref 0–1)
Eosinophils Absolute: 0.2 10*3/uL (ref 0.0–0.7)
Eosinophils Relative: 2 % (ref 0–5)
HCT: 45.4 % (ref 39.0–52.0)
Hemoglobin: 15.2 g/dL (ref 13.0–17.0)
Lymphocytes Relative: 39 % (ref 12–46)
Lymphs Abs: 3 10*3/uL (ref 0.7–4.0)
MCH: 30.5 pg (ref 26.0–34.0)
MCHC: 33.5 g/dL (ref 30.0–36.0)
MCV: 91 fL (ref 78.0–100.0)
Monocytes Absolute: 0.6 10*3/uL (ref 0.1–1.0)
Monocytes Relative: 8 % (ref 3–12)
Neutro Abs: 3.9 10*3/uL (ref 1.7–7.7)
Neutrophils Relative %: 50 % (ref 43–77)
Platelets: 214 10*3/uL (ref 150–400)
RBC: 4.99 MIL/uL (ref 4.22–5.81)
RDW: 15 % (ref 11.5–15.5)
WBC: 7.7 10*3/uL (ref 4.0–10.5)

## 2012-12-02 LAB — LIPID PANEL
Cholesterol: 154 mg/dL (ref 0–200)
HDL: 39 mg/dL — ABNORMAL LOW (ref >39)
LDL Cholesterol: 72 mg/dL (ref 0–99)
Total CHOL/HDL Ratio: 3.9 Ratio
Triglycerides: 216 mg/dL — ABNORMAL HIGH (ref <150)
VLDL: 43 mg/dL — ABNORMAL HIGH (ref 0–40)

## 2012-12-02 LAB — MICROALBUMIN / CREATININE URINE RATIO
Creatinine, Urine: 147.9 mg/dL
Microalb Creat Ratio: 33.5 mg/g — ABNORMAL HIGH (ref 0.0–30.0)
Microalb, Ur: 4.96 mg/dL — ABNORMAL HIGH (ref 0.00–1.89)

## 2012-12-02 LAB — COMPREHENSIVE METABOLIC PANEL
ALT: 23 U/L (ref 0–53)
AST: 21 U/L (ref 0–37)
Albumin: 4.1 g/dL (ref 3.5–5.2)
Alkaline Phosphatase: 56 U/L (ref 39–117)
BUN: 11 mg/dL (ref 6–23)
CO2: 27 mEq/L (ref 19–32)
Calcium: 9.6 mg/dL (ref 8.4–10.5)
Chloride: 101 mEq/L (ref 96–112)
Creat: 0.78 mg/dL (ref 0.50–1.35)
Glucose, Bld: 118 mg/dL — ABNORMAL HIGH (ref 70–99)
Potassium: 4.9 mEq/L (ref 3.5–5.3)
Sodium: 139 mEq/L (ref 135–145)
Total Bilirubin: 0.5 mg/dL (ref 0.3–1.2)
Total Protein: 6.7 g/dL (ref 6.0–8.3)

## 2012-12-02 LAB — HEMOGLOBIN A1C
Hgb A1c MFr Bld: 6.4 % — ABNORMAL HIGH (ref <5.7)
Mean Plasma Glucose: 137 mg/dL — ABNORMAL HIGH (ref <117)

## 2012-12-12 ENCOUNTER — Ambulatory Visit: Payer: Medicaid Other | Admitting: Internal Medicine

## 2012-12-17 ENCOUNTER — Encounter: Payer: Self-pay | Admitting: Internal Medicine

## 2012-12-17 ENCOUNTER — Ambulatory Visit (INDEPENDENT_AMBULATORY_CARE_PROVIDER_SITE_OTHER): Payer: Medicaid Other | Admitting: Internal Medicine

## 2012-12-17 VITALS — BP 131/69 | HR 68 | Ht 71.0 in | Wt 295.4 lb

## 2012-12-17 DIAGNOSIS — I4891 Unspecified atrial fibrillation: Secondary | ICD-10-CM

## 2012-12-17 NOTE — Patient Instructions (Addendum)
Your physician wants you to follow-up in: 12 months with Dr Allred You will receive a reminder letter in the mail two months in advance. If you don't receive a letter, please call our office to schedule the follow-up appointment.  

## 2012-12-17 NOTE — Progress Notes (Signed)
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  Since last being seen in our clinic, the patient reports doing very well.  He has had no symptoms of afib.  Today, he denies symptoms of palpitations, chest pain, shortness of breath,  lower extremity edema, dizziness, presyncope, or syncope.  The patient is otherwise without complaint today.   Past Medical History  Diagnosis Date  . Persistent atrial fibrillation     s/p afib ablation by [facility] 08/01/08  . Cerebrovascular accident     secondary to cardioembolic event Sept 2009  . Central retinal artery occlusion, left eye     diagnosed in 2008  . Hypertension   . Hyperlipidemia   . Obstructive sleep apnea   . Morbid obesity   . Skin infection     chronic left leg herpetic  . Bell's palsy   . CAD (coronary artery disease)     chronically occluded RCA  . Atrial flutter     atypical atrial flutter post PVI  . Diabetes mellitus 11/14/2010    TYPE II  . Obesity   . CHF (congestive heart failure)     history of  . Hypogonadism, male    Past Surgical History  Procedure Laterality Date  . Atrial ablation surgery  08/01/08    CTI and PVI ablation by [facility]    Current Outpatient Prescriptions  Medication Sig Dispense Refill  . ACCU-CHEK AVIVA PLUS test strip USE TEST STRIPS TO CHECK BLOOD SUGAR DAILY  50 each  prn  . ACCU-CHEK FASTCLIX LANCETS MISC USE TO TEST BLOOD SUGAR DAILY  102 each  4  . aspirin (ASPIR-81) 81 MG EC tablet Take 81 mg by mouth daily.        [name] atorvastatin (LIPITOR) 80 MG tablet Take 80 mg by mouth daily.      [name] ezetimibe (ZETIA) 10 MG tablet Take 10 mg by mouth daily.      . isosorbide mononitrate (IMDUR) 30 MG 24 hr tablet Take 30 mg by mouth daily.        . metFORMIN (GLUCOPHAGE) 850 MG tablet TAKE ONE TABLET BY MOUTH TWICE DAILY WITH MEALS  60 tablet  0  . metoprolol (LOPRESSOR) 50 MG tablet TAKE ONE TABLET BY  MOUTH TWICE DAILY  60 tablet  6  . nitroGLYCERIN (NITROSTAT) 0.4 MG SL tablet Place 0.4 mg under the tongue every 5 (five) minutes as needed.        . sotalol (BETAPACE) 160 MG tablet Take 1 tablet (160 mg total) by mouth 2 (two) times daily.  60 tablet  5  . Testosterone (ANDROGEL PUMP) 1.25 GM/ACT (1%) GEL Place onto the skin.        . valACYclovir (VALTREX) 500 MG tablet TAKE ONE TABLET BY MOUTH ONCE DAILY  30 tablet  0  . valsartan (DIOVAN) 160 MG tablet Take 1 tablet (160 mg total) by mouth daily.  30 each  5  . Vitamin D, Ergocalciferol, (DRISDOL) 50000 UNITS CAPS TAKE ONE CAPSULE BY MOUTH ONCE A WEEK  4 capsule  0  . warfarin (COUMADIN) 5 MG tablet Take 5 mg by mouth as directed.         No current facility-administered medications for this visit.    Physical Exam: Filed Vitals:   12/17/12 1219  BP: 131/69  Pulse: 68  Height: 5\' 11"  (1.803 m)  Weight: 295 lb 6.4 oz (133.993 kg)  GEN- The patient is well appearing, alert and oriented x 3 today.   Head- normocephalic, atraumatic Eyes-  Sclera clear, conjunctiva pink Ears- hearing intact Oropharynx- clear Lungs- Clear to ausculation bilaterally, normal work of breathing Heart- Regular rate and rhythm, no murmurs, rubs or gallops, PMI not laterally displaced GI- soft, NT, ND, + BS Extremities- no clubbing, cyanosis, or edema  ekg today reveals sinus rhythm 68 bpm, septal infarct  Assessment and Plan:  1. Afib/ atrial flutter Well controlled Continue long term anticoagulation with coumadin  Return in 1 year

## 2012-12-23 ENCOUNTER — Other Ambulatory Visit: Payer: Self-pay | Admitting: Medical

## 2013-01-14 ENCOUNTER — Telehealth: Payer: Self-pay | Admitting: Medical

## 2013-01-14 NOTE — Telephone Encounter (Signed)
Fax refill request from [facility]  Valtrex 500 mg #30  Last filled 12/23/12

## 2013-01-14 NOTE — Telephone Encounter (Signed)
Verify why he takes this? (cold sores? Genital herpes? I can't recall.

## 2013-01-14 NOTE — Telephone Encounter (Signed)
lmom to cb. cls 

## 2013-01-21 ENCOUNTER — Other Ambulatory Visit: Payer: Self-pay | Admitting: Medical

## 2013-01-21 MED ORDER — VALACYCLOVIR HCL 500 MG PO TABS: 500.0000 mg | ORAL_TABLET | Freq: Every day | ORAL | Status: DC

## 2013-01-21 NOTE — Telephone Encounter (Signed)
Patients wife said it is for neither. He had a place on his leg that would clear up then come back so [name] send off to lab and it came back Herpatic Sore so they prescribe Valtrex to take everyday so it want come back. CLS

## 2013-04-07 ENCOUNTER — Other Ambulatory Visit: Payer: Self-pay | Admitting: Cardiology

## 2013-04-07 DIAGNOSIS — Z79899 Other long term (current) drug therapy: Secondary | ICD-10-CM

## 2013-04-07 DIAGNOSIS — E7800 Pure hypercholesterolemia, unspecified: Secondary | ICD-10-CM

## 2013-04-18 ENCOUNTER — Other Ambulatory Visit: Payer: Self-pay | Admitting: Cardiology

## 2013-04-18 DIAGNOSIS — Z79899 Other long term (current) drug therapy: Secondary | ICD-10-CM

## 2013-04-18 DIAGNOSIS — E7800 Pure hypercholesterolemia, unspecified: Secondary | ICD-10-CM

## 2013-04-27 ENCOUNTER — Other Ambulatory Visit: Payer: Self-pay | Admitting: Internal Medicine

## 2013-04-29 ENCOUNTER — Other Ambulatory Visit: Payer: Self-pay | Admitting: General Surgery

## 2013-04-29 MED ORDER — ATORVASTATIN CALCIUM 80 MG PO TABS: 80.0000 mg | ORAL_TABLET | Freq: Every day | ORAL | Status: DC

## 2013-05-08 ENCOUNTER — Other Ambulatory Visit (INDEPENDENT_AMBULATORY_CARE_PROVIDER_SITE_OTHER): Payer: Medicaid Other

## 2013-05-08 ENCOUNTER — Ambulatory Visit (INDEPENDENT_AMBULATORY_CARE_PROVIDER_SITE_OTHER): Payer: Medicaid Other | Admitting: Pharmacist

## 2013-05-08 ENCOUNTER — Other Ambulatory Visit: Payer: Medicaid Other

## 2013-05-08 DIAGNOSIS — E7800 Pure hypercholesterolemia, unspecified: Secondary | ICD-10-CM

## 2013-05-08 DIAGNOSIS — Z79899 Other long term (current) drug therapy: Secondary | ICD-10-CM

## 2013-05-08 DIAGNOSIS — I6350 Cerebral infarction due to unspecified occlusion or stenosis of unspecified cerebral artery: Secondary | ICD-10-CM

## 2013-05-08 DIAGNOSIS — I4891 Unspecified atrial fibrillation: Secondary | ICD-10-CM

## 2013-05-08 LAB — LIPID PANEL
Cholesterol: 153 mg/dL (ref 0–200)
HDL: 40.2 mg/dL (ref >39.00)
Total CHOL/HDL Ratio: 4
Triglycerides: 258 mg/dL — ABNORMAL HIGH (ref 0.0–149.0)
VLDL: 51.6 mg/dL — ABNORMAL HIGH (ref 0.0–40.0)

## 2013-05-08 LAB — ALT: ALT: 23 U/L (ref 0–53)

## 2013-05-08 LAB — POCT INR: INR: 2.1

## 2013-05-08 LAB — LDL CHOLESTEROL, DIRECT: Direct LDL: 87.7 mg/dL

## 2013-05-11 ENCOUNTER — Telehealth: Payer: Self-pay | Admitting: Pharmacist

## 2013-05-11 DIAGNOSIS — Z79899 Other long term (current) drug therapy: Secondary | ICD-10-CM

## 2013-05-11 DIAGNOSIS — E785 Hyperlipidemia, unspecified: Secondary | ICD-10-CM

## 2013-05-11 MED ORDER — FISH OIL 1000 MG PO CAPS: 2000.0000 mg | ORAL_CAPSULE | Freq: Every day | ORAL | Status: DC

## 2013-05-11 NOTE — Telephone Encounter (Signed)
RF:  CAD, Diabetes, HTN, TIA, age - LDL goal < 70, non-HDL < 100 Meds:  Lipitor 80 mg qd, Zetia 10 mg qd.  Not taking fish oil per patient. Insurance won't cover Crestor as he tolerates Lipitor. Patient hasn't been taking his fish oil - suppose to be taking 2 per day. LDL improved from 96 to 88, and non-HDL down from 116 to 113 over past few months. Patient declined doing a PCSK-9 lipid lowering study in the past. Plan: 1.  Add fish oil 2000 mg daily. 2.  Continue lipitor and Zetia daily. 3.  Recheck lipid panel and hepatic panel in 4 months (orders up in - and check out asked to set up lab as he is [place]). Patient notified.

## 2013-05-11 NOTE — Telephone Encounter (Signed)
Agree with plan as outlined by Jeremy Smart PharmD 

## 2013-05-19 ENCOUNTER — Other Ambulatory Visit: Payer: Self-pay | Admitting: Internal Medicine

## 2013-05-20 ENCOUNTER — Telehealth: Payer: Self-pay | Admitting: Internal Medicine

## 2013-05-20 ENCOUNTER — Other Ambulatory Visit: Payer: Self-pay | Admitting: Family Medicine

## 2013-05-20 MED ORDER — METFORMIN HCL 850 MG PO TABS: 850.0000 mg | ORAL_TABLET | Freq: Two times a day (BID) | ORAL | Status: DC

## 2013-05-20 NOTE — Telephone Encounter (Signed)
Refill request for metformin 850mg  #60 to [facility] pharmacy

## 2013-05-20 NOTE — Telephone Encounter (Signed)
Medication refill sent for only 30 days and he has an appt. For 06/10/13 @ 830 am. CLS

## 2013-05-20 NOTE — Telephone Encounter (Signed)
See prior messages.  Due for diabetes recheck visit.

## 2013-05-23 DIAGNOSIS — E291 Testicular hypofunction: Secondary | ICD-10-CM

## 2013-05-23 DIAGNOSIS — R319 Hematuria, unspecified: Secondary | ICD-10-CM

## 2013-05-23 HISTORY — DX: Testicular hypofunction: E29.1

## 2013-05-23 HISTORY — DX: Hematuria, unspecified: R31.9

## 2013-06-09 ENCOUNTER — Ambulatory Visit (INDEPENDENT_AMBULATORY_CARE_PROVIDER_SITE_OTHER): Payer: Medicaid Other | Admitting: Pharmacist

## 2013-06-09 DIAGNOSIS — I6350 Cerebral infarction due to unspecified occlusion or stenosis of unspecified cerebral artery: Secondary | ICD-10-CM

## 2013-06-09 DIAGNOSIS — I4891 Unspecified atrial fibrillation: Secondary | ICD-10-CM

## 2013-06-09 LAB — POCT INR: INR: 2.3

## 2013-06-10 ENCOUNTER — Ambulatory Visit (INDEPENDENT_AMBULATORY_CARE_PROVIDER_SITE_OTHER): Payer: Medicaid Other | Admitting: Medical

## 2013-06-10 ENCOUNTER — Telehealth: Payer: Self-pay | Admitting: Internal Medicine

## 2013-06-10 ENCOUNTER — Encounter: Payer: Self-pay | Admitting: Medical

## 2013-06-10 VITALS — BP 140/90 | HR 72 | Temp 97.7°F | Resp 18 | Wt 283.0 lb

## 2013-06-10 DIAGNOSIS — E785 Hyperlipidemia, unspecified: Secondary | ICD-10-CM

## 2013-06-10 DIAGNOSIS — E559 Vitamin D deficiency, unspecified: Secondary | ICD-10-CM

## 2013-06-10 DIAGNOSIS — I4891 Unspecified atrial fibrillation: Secondary | ICD-10-CM

## 2013-06-10 DIAGNOSIS — E669 Obesity, unspecified: Secondary | ICD-10-CM

## 2013-06-10 DIAGNOSIS — E119 Type 2 diabetes mellitus without complications: Secondary | ICD-10-CM

## 2013-06-10 LAB — POCT GLYCOSYLATED HEMOGLOBIN (HGB A1C): Hemoglobin A1C: 7.2

## 2013-06-10 MED ORDER — METFORMIN HCL 1000 MG PO TABS: 1000.0000 mg | ORAL_TABLET | Freq: Two times a day (BID) | ORAL | Status: DC

## 2013-06-10 MED ORDER — VALACYCLOVIR HCL 500 MG PO TABS: 500.0000 mg | ORAL_TABLET | Freq: Every day | ORAL | Status: DC

## 2013-06-10 NOTE — Telephone Encounter (Signed)
Refill request for valtrex 500mg  to [facility] pharmacy

## 2013-06-10 NOTE — Patient Instructions (Signed)
Diabetes Meal Planning Guide The diabetes meal planning guide is a tool to help you plan your meals and snacks. It is important for people with diabetes to manage their blood glucose (sugar) levels. Choosing the right foods and the right amounts throughout your day will help control your blood glucose. Eating right can even help you improve your blood pressure and reach or maintain a healthy weight.  CARBOHYDRATE COUNTING MADE EASY When you eat carbohydrates, they turn to sugar. This raises your blood glucose level. Counting carbohydrates can help you control this level so you feel better. When you plan your meals by counting carbohydrates, you can have more flexibility in what you eat and balance your medicine with your food intake.  Carbohydrate counting simply means adding up the total amount of carbohydrate grams in your meals and snacks. Try to eat about the same amount at each meal. Foods with carbohydrates are listed below.   Each portion below is 1 carbohydrate serving or 15 grams of carbohydrates.    1800 Calorie Diet for Diabetes Meal Planning The 1800 calorie diet is designed for eating up to 1800 calories each day. Following this diet and making healthy meal choices can help improve overall health. This diet controls blood sugar (glucose) levels and can also help lower blood pressure and cholesterol.  SERVING SIZES Measuring foods and serving sizes helps to make sure you are getting the right amount of food. The list below tells how big or small some common serving sizes are:  1 oz.........4 stacked dice.  3 oz.........Deck of cards.  1 tsp........Tip of little finger.  1 tbs........Thumb.  2 tbs........Golf ball.   cup.......Half of a fist.  1 cup........A fist.   GUIDELINES FOR CHOOSING FOODS The goal of this diet is to eat a variety of foods and limit calories to 1800 each day. This can be done by choosing foods that are low in calories and fat. The diet also suggests  eating small amounts of food frequently. Doing this helps control your blood glucose levels so they do not get too high or too low. Each meal or snack may include a protein food source to help you feel more satisfied and to stabilize your blood glucose. Try to eat about the same amount of food around the same time each day. This includes weekend days, travel days, and days off work. Space your meals about 4 to 5 hours apart and add a snack between them if you wish.  For example, a daily food plan could include breakfast, a morning snack, lunch, dinner, and an evening snack. Healthy meals and snacks include whole grains, vegetables, fruits, lean meats, poultry, fish, and dairy products. As you plan your meals, select a variety of foods. Choose from the bread and starch, vegetable, fruit, dairy, and meat/protein groups. Examples of foods from each group and their suggested serving sizes are listed below. Use measuring cups and spoons to become familiar with what a healthy portion looks like. Bread and Starch Each serving equals 15 grams of carbohydrates.  1 slice bread.   bagel.   cup cold cereal (unsweetened).   cup hot cereal or mashed potatoes.  1 small potato (size of a computer mouse).   cup cooked pasta or rice.   English muffin.  1 cup broth-based soup.  3 cups of popcorn.  4 to 6 whole-wheat crackers.   cup cooked beans, peas, or corn. Vegetable Each serving equals 5 grams of carbohydrates.   cup cooked vegetables.  1   cup raw vegetables.   cup tomato or vegetable juice. Fruit Each serving equals 15 grams of carbohydrates.  1 small apple or orange.  1 cup watermelon or strawberries.   cup applesauce (no sugar added).  2 tbs raisins.   banana.   cup canned fruit, packed in water, its own juice, or sweetened with a sugar substitute.   cup unsweetened fruit juice. Dairy Each serving equals 12 to 15 grams of carbohydrates.  1 cup fat-free milk.  6  oz artificially sweetened yogurt or plain yogurt.  1 cup low-fat buttermilk.  1 cup soy milk.  1 cup almond milk. Meat/Protein  1 large egg.  2 to 3 oz meat, poultry, or fish.   cup low-fat cottage cheese.  1 tbs peanut butter.  1 oz low-fat cheese.   cup tuna in water.   cup tofu. Fat  1 tsp oil.  1 tsp trans-fat-free margarine.  1 tsp butter.  1 tsp mayonnaise.  2 tbs avocado.  1 tbs salad dressing.  1 tbs cream cheese.  2 tbs sour cream.  SAMPLE 1800 CALORIE DIET PLAN Breakfast   cup unsweetened cereal (1 carb serving).  1 cup fat-free milk (1 carb serving).  1 slice whole-wheat toast (1 carb serving).   small banana (1 carb serving).  1 scrambled egg.  1 tsp trans-fat-free margarine. Lunch  Tuna sandwich.  2 slices whole-wheat bread (2 carb servings).   cup canned tuna in water, drained.  1 tbs reduced fat mayonnaise.  1 stalk celery, chopped.  2 slices tomato.  1 lettuce leaf.  1 cup carrot sticks.  24 to 30 seedless grapes (2 carb servings).  6 oz light yogurt (1 carb serving). Afternoon Snack  3 graham cracker squares (1 carb serving).  Fat-free milk, 1 cup (1 carb serving).  1 tbs peanut butter. Dinner  3 oz salmon, broiled with 1 tsp oil.  1 cup mashed potatoes (2 carb servings) with 1 tsp trans-fat-free margarine.  1 cup fresh or frozen green beans.  1 cup steamed asparagus.  1 cup fat-free milk (1 carb serving). Evening Snack  3 cups air-popped popcorn (1 carb serving).  2 tbs parmesan cheese sprinkled on top.  MEAL PLAN Use this worksheet to help you make a daily meal plan based on the 1800 calorie diet suggestions. If you are using this plan to help you control your blood glucose, you may interchange carbohydrate-containing foods (dairy, starches, and fruits). Select a variety of fresh foods of varying colors and flavors. The total amount of carbohydrate in your meals or snacks is more important  than making sure you include all of the food groups every time you eat. Choose from the following foods to build your day's meals:  8 Starches.  4 Vegetables.  3 Fruits.  2 Dairy.  6 to 7 oz Meat/Protein.  Up to 4 Fats.  Your dietician can use this worksheet to help you decide how many servings and which types of foods are right for you. BREAKFAST Food Group and Servings / Food Choice Starch ________________________________________________________ Dairy _________________________________________________________ Fruit _________________________________________________________ Meat/Protein __________________________________________________ Fat ___________________________________________________________ LUNCH Food Group and Servings / Food Choice Starch ________________________________________________________ Meat/Protein __________________________________________________ Vegetable _____________________________________________________ Fruit _________________________________________________________ Dairy _________________________________________________________ Fat ___________________________________________________________ AFTERNOON SNACK Food Group and Servings / Food Choice Starch ________________________________________________________ Meat/Protein __________________________________________________ Fruit __________________________________________________________ Dairy _________________________________________________________ DINNER Food Group and Servings / Food Choice Starch _________________________________________________________ Meat/Protein ___________________________________________________ Dairy __________________________________________________________ Vegetable ______________________________________________________ Fruit ___________________________________________________________ Fat ____________________________________________________________ EVENING SNACK Food  Group and Servings / Food Choice   Fruit __________________________________________________________ Meat/Protein ___________________________________________________ Dairy __________________________________________________________ Starch _________________________________________________________ DAILY TOTALS Starch ____________________________ Vegetable _________________________ Fruit _____________________________ Dairy _____________________________ Meat/Protein______________________ Fat _______________________________ Document Released: 01/29/2005 Document Revised: 10/01/2011 Document Reviewed: 05/25/2011 [employer] Patient Information 2013 [employer].  

## 2013-06-10 NOTE — Telephone Encounter (Signed)
rx was sent

## 2013-06-10 NOTE — Telephone Encounter (Signed)
Pt is coming in today

## 2013-06-10 NOTE — Telephone Encounter (Signed)
[name], did you send the refill on this medication for this patient today? CLS

## 2013-06-10 NOTE — Progress Notes (Signed)
  Subjective:    [name] is a 62 y.o. male who presents for follow-up of Type 2 diabetes mellitus, his last visit for the same was May of this year.  He has seen his cardiologist recently. He reports that he has seen his eye doctor within the last year, Dr. Ashley Royalty.  Diabetes- He checks his sugars sometimes, not daily.  Home blood sugar records: 140-187  Current symptoms/problems include none and have been stable. Daily foot checks: yes   Any foot concerns: none Medication compliance: He is taking metformin 850 twice a day. Current diet: some days good and some days bad Current exercise: walking Cardiovascular risk factors: advanced age (older than 64 for men, 59 for women), diabetes mellitus, dyslipidemia and obesity (BMI >= 30 kg/m2)  His cardiologist manages his blood pressure medication and lipid medication. He is compliant with all of his medications  He is not taking vitamin D at the moment, as he ran out of the prescription vitamin D. He is not checking his blood pressure regularly as he damaged his blood pressure cuff by dropping in the washing machine.  He declines routine vaccines  The following portions of the patient's history were reviewed and updated as appropriate: allergies, current medications, past family history, past medical history, past social history, past surgical history and problem list.  ROS as in subjective above    Objective:    BP 140/90  Pulse 72  Temp(Src) 97.7 F (36.5 C) (Oral)  Wt 283 lb (128.368 kg)   General appearance: alert, no distress, WD/WN, obese white male Neck: supple, no lymphadenopathy, no thyromegaly, no masses Heart: RRR, normal S1, S2, no murmurs Lungs: CTA bilaterally, no wheezes, rhonchi, or rales Abdomen: +bs, soft, non tender, non distended, no masses, no hepatomegaly, no splenomegaly Pulses: 2+ symmetric, upper and lower extremities, normal cap refill Ext: no edema Foot exam: no lesions Neuro: foot monofilament  exam normal   Assessment:   Encounter Diagnoses  Name Primary?  . Type II or unspecified type diabetes mellitus without mention of complication, not stated as uncontrolled Yes  . Obesity, unspecified   . Unspecified vitamin D deficiency   . Hyperlipidemia   . Atrial fibrillation      Plan:   Diabetes type II - HgbA1C 7.2%. Advised he check his sugars more often, check feet daily, see eye doctor yearly.  I don't think he really uses diet discretion although he says he will work on this. I gave him a handout today on diet recommendations, we discussed this today.  I recommended we try adding Victoza or Farxiga to help with weight loss and to help him get his sugars under better control.  He declines those medications, but will begin metformin 1000 twice a day. This is an increase from metformin 850 twice a day.   Obesity - recommend he make diet changes, c/t walking and try to lose weight.   Vit D deficiency - refilled once weekly Vit D.  He does not want to take OTC vit D.  Hyperlipidemia, afib - managed by cardiology F/u 46mo

## 2013-06-25 ENCOUNTER — Other Ambulatory Visit: Payer: Self-pay | Admitting: Internal Medicine

## 2013-07-01 ENCOUNTER — Telehealth: Payer: Self-pay | Admitting: Cardiology

## 2013-07-01 MED ORDER — METOPROLOL TARTRATE 50 MG PO TABS: 50.0000 mg | ORAL_TABLET | Freq: Two times a day (BID) | ORAL | Status: DC

## 2013-07-01 MED ORDER — VALSARTAN 160 MG PO TABS: 160.0000 mg | ORAL_TABLET | Freq: Every day | ORAL | Status: DC

## 2013-07-01 NOTE — Telephone Encounter (Signed)
New problem     [facility] in [place].'  Pt need refill BP med

## 2013-07-01 NOTE — Telephone Encounter (Signed)
Both BP meds sent in for pt.

## 2013-07-01 NOTE — Telephone Encounter (Signed)
Pt calling for refills//error//SR

## 2013-07-02 ENCOUNTER — Telehealth: Payer: Self-pay

## 2013-07-02 NOTE — Telephone Encounter (Signed)
I called the drug plan and his diovan was approved, i also called the pharmacy to see if it would go through and it did so I called the patient and left a message to call the office

## 2013-07-02 NOTE — Telephone Encounter (Signed)
Pt is aware.  

## 2013-07-02 NOTE — Telephone Encounter (Signed)
Patient called needed a pa for his diovan, I called his drug plan and spent 45 minutes trying to get this. Phone  Of drug plan [phone], ID [id number] R

## 2013-07-02 NOTE — Telephone Encounter (Signed)
Called wife and made aware we are working on [place] of meds. I told her I would check with Rose and see where we are at with the PA.

## 2013-07-02 NOTE — Telephone Encounter (Signed)
Follow up     Pt having trouble getting bp medicine refilled----he has been out of medication for 4 days----[facility]  Wife does not know name of bp medication----pls call when medication has been called in.

## 2013-07-06 ENCOUNTER — Ambulatory Visit: Payer: Medicaid Other | Admitting: Cardiology

## 2013-07-20 ENCOUNTER — Ambulatory Visit (INDEPENDENT_AMBULATORY_CARE_PROVIDER_SITE_OTHER): Payer: Medicaid Other | Admitting: Medical

## 2013-07-20 ENCOUNTER — Encounter: Payer: Self-pay | Admitting: Medical

## 2013-07-20 VITALS — BP 130/78 | HR 82 | Temp 97.6°F | Resp 18 | Wt 285.0 lb

## 2013-07-20 DIAGNOSIS — J209 Acute bronchitis, unspecified: Secondary | ICD-10-CM

## 2013-07-20 MED ORDER — AZITHROMYCIN 250 MG PO TABS: ORAL_TABLET | ORAL | Status: DC

## 2013-07-20 NOTE — Patient Instructions (Signed)
Begin Zpak antibiotic, continue Mucinex DM, increase water intake, rest.  For the next 5 days, only take 1/2 tablet Lipitor, then resume 1 whole tablet daily once you finish antibiotics.  If not improving, then call back.

## 2013-07-20 NOTE — Progress Notes (Signed)
Subjective:  [name] is a 62 y.o. male who presents for cough since I last saw his son for respiratory illness.  Symptoms include cough, run down feeling.  Sore throat this morning.  Has had some runny nose. Denies fever, no chills or sweats, no NV, no SOB, no wheezing, no chest pain.  Denies sinus pressure, no head congestion.   Treatment to date: Mucinex x 2 days.  He does not smoke.  No other aggravating or relieving factors.  No other c/o.  The following portions of the patient's history were reviewed and updated as appropriate: allergies, current medications, past family history, past medical history, past social history, past surgical history and problem list.  ROS as in subjective  Past Medical History  Diagnosis Date  . Persistent atrial fibrillation     s/p afib ablation by JA 08/01/08  . Cerebrovascular accident     secondary to cardioembolic event Sept 2009  . Central retinal artery occlusion, left eye     diagnosed in 2008  . Hypertension   . Hyperlipidemia   . Obstructive sleep apnea   . Morbid obesity   . Skin infection     chronic left leg herpetic  . Bell's palsy   . CAD (coronary artery disease)     chronically occluded RCA  . Atrial flutter     atypical atrial flutter post PVI  . Diabetes mellitus 11/14/2010    TYPE II  . Obesity   . CHF (congestive heart failure)     history of  . Hypogonadism, male   . 23-polyvalent pneumococcal polysaccharide vaccine declined 05/2013  . Refused influenza vaccine 05/2013     Objective: BP 130/78  Pulse 82  Temp(Src) 97.6 F (36.4 C) (Oral)  Resp 18  Wt 285 lb (129.275 kg)   General appearance: Alert, WD/WN, no distress                             Skin: warm, no rash, no diaphoresis                           Head: no sinus tenderness                            Eyes: conjunctiva normal, corneas clear, PERRLA                            Ears: flat TMs, external ear canals normal                          Nose:  septum midline, turbinates swollen, with erythema and clear discharge             Mouth/throat: MMM, tongue normal, mild pharyngeal erythema                           Neck: supple, no adenopathy, no thyromegaly, nontender                          Heart: RRR, normal S1, S2, no murmurs                         Lungs: +bronchial breath sounds, coarse sounds, no rhonchi, no  wheezes, no rales                Extremities: no edema, nontender     Assessment: Encounter Diagnosis  Name Primary?  . Acute bronchitis Yes    Plan:  Medication orders today include:  Zpak, cut back on Lipitor to 1/2 tablet daily this week, then back to normal.   C/t Mucinex DM.  Rest, hydrate well.  Call/return in 2-3 days if symptoms are worse or not improving.  Advised that cough may linger even after the infection is improved.

## 2013-07-24 ENCOUNTER — Other Ambulatory Visit: Payer: Self-pay | Admitting: Pharmacist

## 2013-07-24 MED ORDER — WARFARIN SODIUM 5 MG PO TABS: ORAL_TABLET | ORAL | Status: DC

## 2013-07-27 ENCOUNTER — Encounter: Payer: Self-pay | Admitting: Cardiology

## 2013-07-27 ENCOUNTER — Ambulatory Visit (INDEPENDENT_AMBULATORY_CARE_PROVIDER_SITE_OTHER): Payer: Medicaid Other | Admitting: Cardiology

## 2013-07-27 ENCOUNTER — Ambulatory Visit (INDEPENDENT_AMBULATORY_CARE_PROVIDER_SITE_OTHER): Payer: Medicaid Other | Admitting: Pharmacist

## 2013-07-27 VITALS — BP 159/98 | HR 79 | Ht 71.0 in | Wt 291.1 lb

## 2013-07-27 DIAGNOSIS — I4891 Unspecified atrial fibrillation: Secondary | ICD-10-CM

## 2013-07-27 DIAGNOSIS — I6350 Cerebral infarction due to unspecified occlusion or stenosis of unspecified cerebral artery: Secondary | ICD-10-CM

## 2013-07-27 DIAGNOSIS — I2510 Atherosclerotic heart disease of native coronary artery without angina pectoris: Secondary | ICD-10-CM

## 2013-07-27 DIAGNOSIS — I10 Essential (primary) hypertension: Secondary | ICD-10-CM

## 2013-07-27 DIAGNOSIS — E785 Hyperlipidemia, unspecified: Secondary | ICD-10-CM

## 2013-07-27 DIAGNOSIS — G4733 Obstructive sleep apnea (adult) (pediatric): Secondary | ICD-10-CM

## 2013-07-27 LAB — BASIC METABOLIC PANEL
BUN: 11 mg/dL (ref 6–23)
CO2: 27 mEq/L (ref 19–32)
Calcium: 8.7 mg/dL (ref 8.4–10.5)
Chloride: 102 mEq/L (ref 96–112)
Creatinine, Ser: 0.8 mg/dL (ref 0.4–1.5)
GFR: 105.33 mL/min (ref >60.00)
Glucose, Bld: 157 mg/dL — ABNORMAL HIGH (ref 70–99)
Potassium: 4.2 mEq/L (ref 3.5–5.1)
Sodium: 137 mEq/L (ref 135–145)

## 2013-07-27 LAB — POCT INR: INR: 2.2

## 2013-07-27 NOTE — Patient Instructions (Signed)
Your physician recommends that you continue on your current medications as directed. Please refer to the Current Medication list given to you today.  Your physician recommends that you go to the lab today for a BMET Panel  Your physician has requested that you regularly monitor and record your blood pressure readings at home. Please use the same machine at the same time of day to check your readings and record them and then call us at [phone] in one week to let us know the results.  Your physician wants you to follow-up in: 6 Months with Dr Mallie Snooks will receive a reminder letter in the mail two months in advance. If you don't receive a letter, please call our office to schedule the follow-up appointment.

## 2013-07-27 NOTE — Progress Notes (Signed)
[place] [place],   [address] Phone: [phone] Fax:  [phone]  Date:  07/27/2013   ID:  [name], DOB [birth year], MRN [id number]  PCP:  Wyatt Haste, MD  Cardiologist:  Fransico Him, MD  Electrophysiologist:  Dr. Rayann Heman   History of Present Illness: [name] is a 63 y.o. male with a history of ASCAD, HTN, OSA, morbid obesity, PAF, dyslipidemia who presents today for followup.  He is doing well.  He denies any chest pain, SOB, DOE, LE edema, palpitations or syncope.  He tolerates his CPAP well.  He tolerates the pressure and mask without problems.  He feels rested when he gets up and has no daytime sleepiness.   Wt Readings from Last 3 Encounters:  07/27/13 291 lb 1.9 oz (132.051 kg)  07/20/13 285 lb (129.275 kg)  06/10/13 283 lb (128.368 kg)     Past Medical History  Diagnosis Date  . Cerebrovascular accident     secondary to cardioembolic event Sept 3151  . Central retinal artery occlusion, left eye     diagnosed in 2008  . Obstructive sleep apnea   . Morbid obesity   . Skin infection     chronic left leg herpetic  . Bell's palsy   . Diabetes mellitus 11/14/2010    TYPE II  . Obesity   . CHF (congestive heart failure)     history of  . Hypogonadism, male   . 23-polyvalent pneumococcal polysaccharide vaccine declined 05/2013  . Refused influenza vaccine 05/2013  . Hypertension   . Hyperlipidemia   . Persistent atrial fibrillation     s/p afib ablation by JA 08/01/08  . Atrial flutter     atypical atrial flutter post PVI  . CAD (coronary artery disease) 07/2009    chronically occluded RCA with right to left collaterals s/p failed PCI    Current Outpatient Prescriptions  Medication Sig Dispense Refill  . ACCU-CHEK AVIVA PLUS test strip USE TEST STRIPS TO CHECK BLOOD SUGAR DAILY  50 each  prn  . ACCU-CHEK FASTCLIX LANCETS MISC USE TO TEST BLOOD SUGAR DAILY  102 each  4  . aspirin (ASPIR-81) 81 MG EC tablet Take 81 mg by  mouth daily.        [name] atorvastatin (LIPITOR) 80 MG tablet Take 1 tablet (80 mg total) by mouth daily.  30 tablet  11  . ezetimibe (ZETIA) 10 MG tablet Take 10 mg by mouth daily.      . isosorbide mononitrate (IMDUR) 30 MG 24 hr tablet Take 30 mg by mouth daily.        . metFORMIN (GLUCOPHAGE) 1000 MG tablet Take 1 tablet (1,000 mg total) by mouth 2 (two) times daily with a meal.  180 tablet  3  . metoprolol (LOPRESSOR) 50 MG tablet Take 1 tablet (50 mg total) by mouth 2 (two) times daily.  60 tablet  1  . nitroGLYCERIN (NITROSTAT) 0.4 MG SL tablet Place 0.4 mg under the tongue every 5 (five) minutes as needed.        . Omega-3 Fatty Acids (FISH OIL) 1000 MG CAPS Take 2 capsules (2,000 mg total) by mouth daily.    0  . sotalol (BETAPACE) 160 MG tablet TAKE ONE TABLET BY MOUTH TWICE DAILY  60 tablet  5  . Testosterone (ANDROGEL PUMP) 1.25 GM/ACT (1%) GEL Place onto the skin.        . valACYclovir (VALTREX) 500 MG tablet Take 1 tablet (500 mg total)  by mouth daily.  30 tablet  3  . valsartan (DIOVAN) 160 MG tablet Take 1 tablet (160 mg total) by mouth daily.  30 tablet  1  . Vitamin D, Ergocalciferol, (DRISDOL) 50000 UNITS CAPS TAKE ONE CAPSULE BY MOUTH ONCE A WEEK  4 capsule  0  . warfarin (COUMADIN) 5 MG tablet Take 1 take on all days except 1 and 1/2 tablets on Wednesday and Friday, or as directed by [facility]  40 tablet  5   No current facility-administered medications for this visit.    Allergies:   No Known Allergies  Social History:  The patient  reports that he quit smoking about 34 years ago. He does not have any smokeless tobacco history on file. He reports that he does not drink alcohol or use illicit drugs.   Family History:  The patient's family history includes Heart disease in his father and mother; Stroke in his father.   ROS:  Please see the history of present illness.      All other systems reviewed and negative.   PHYSICAL EXAM: VS:  BP 159/98  Pulse 79  Ht 5\' 11"   (1.803 m)  Wt 291 lb 1.9 oz (132.051 kg)  BMI 40.62 kg/m2 Well nourished, well developed, in no acute distress HEENT: normal Neck: no JVD Cardiac:  normal S1, S2; RRR; no murmur Lungs:  clear to auscultation bilaterally, no wheezing, rhonchi or rales Abd: soft, nontender, no hepatomegaly Ext: no edema Skin: warm and dry Neuro:  CNs 2-12 intact, no focal abnormalities noted  EKG:  NSR with QTc 471msec     ASSESSMENT AND PLAN:  1. ASCAD with no angina  - continue ASA/imdur 2. HTN - elevated today on exam  - continue Diovan/Lopressor   - check BMET  - He says that his BP cuff fell in the washer so he is going to get a new cuff today and check his BP daily for a week and call with the results.  He says his BP usually does not run that high. 3.   Dyslipidemia   - continue fish oil/Zetia/atorvastatin  4.   OSA on CPAP - his CPAP download showed an AHI of 4.3/hr on 16cm H2o and 97% compliance in using more than 4 hours nightly 5.   Obesity - I encouraged him to try to get into a walking exercise program and follow a low fat diet and portion control  Followup with me in 6 months  Signed, Fransico Him, MD 07/27/2013 10:47 AM

## 2013-07-28 ENCOUNTER — Telehealth: Payer: Self-pay | Admitting: Cardiology

## 2013-07-28 NOTE — Telephone Encounter (Signed)
Lm on machine to call back.

## 2013-07-28 NOTE — Telephone Encounter (Signed)
New message     Returned a nurses call to get blood wk results

## 2013-07-28 NOTE — Progress Notes (Signed)
Pt would like a rx for Omeprazole,takes it OTC and would be cheaper if med is rx. Is this okay

## 2013-07-29 ENCOUNTER — Telehealth: Payer: Self-pay | Admitting: *Deleted

## 2013-07-29 NOTE — Telephone Encounter (Signed)
Pt returning Anita's call. Pt aware of results. He does remember someone calling him yesterday but pt had been called by pcp office & our office lab results but just wanted to make sure.  Horton Chin RN

## 2013-07-29 NOTE — Telephone Encounter (Signed)
Pt aware. See telephone note Horton Chin RN

## 2013-09-01 ENCOUNTER — Other Ambulatory Visit: Payer: Self-pay

## 2013-09-01 MED ORDER — VALSARTAN 160 MG PO TABS: 160.0000 mg | ORAL_TABLET | Freq: Every day | ORAL | Status: DC

## 2013-09-01 MED ORDER — ISOSORBIDE MONONITRATE ER 30 MG PO TB24: 30.0000 mg | ORAL_TABLET | Freq: Every day | ORAL | Status: DC

## 2013-09-09 ENCOUNTER — Ambulatory Visit (INDEPENDENT_AMBULATORY_CARE_PROVIDER_SITE_OTHER): Payer: Medicaid Other | Admitting: *Deleted

## 2013-09-09 ENCOUNTER — Other Ambulatory Visit: Payer: Medicaid Other

## 2013-09-09 DIAGNOSIS — E785 Hyperlipidemia, unspecified: Secondary | ICD-10-CM

## 2013-09-09 DIAGNOSIS — Z79899 Other long term (current) drug therapy: Secondary | ICD-10-CM

## 2013-09-09 DIAGNOSIS — I4891 Unspecified atrial fibrillation: Secondary | ICD-10-CM

## 2013-09-09 DIAGNOSIS — Z5181 Encounter for therapeutic drug level monitoring: Secondary | ICD-10-CM

## 2013-09-09 DIAGNOSIS — I6350 Cerebral infarction due to unspecified occlusion or stenosis of unspecified cerebral artery: Secondary | ICD-10-CM

## 2013-09-09 DIAGNOSIS — Z0000 Encounter for general adult medical examination without abnormal findings: Secondary | ICD-10-CM | POA: Insufficient documentation

## 2013-09-09 LAB — LIPID PANEL
Cholesterol: 135 mg/dL (ref 0–200)
HDL: 40.4 mg/dL (ref >39.00)
LDL Cholesterol: 64 mg/dL (ref 0–99)
Total CHOL/HDL Ratio: 3
Triglycerides: 151 mg/dL — ABNORMAL HIGH (ref 0.0–149.0)
VLDL: 30.2 mg/dL (ref 0.0–40.0)

## 2013-09-09 LAB — POCT INR: INR: 1.8

## 2013-09-09 LAB — HEPATIC FUNCTION PANEL
ALT: 25 U/L (ref 0–53)
AST: 22 U/L (ref 0–37)
Albumin: 3.9 g/dL (ref 3.5–5.2)
Alkaline Phosphatase: 56 U/L (ref 39–117)
Bilirubin, Direct: 0 mg/dL (ref 0.0–0.3)
Total Bilirubin: 0.5 mg/dL (ref 0.3–1.2)
Total Protein: 7.2 g/dL (ref 6.0–8.3)

## 2013-09-10 ENCOUNTER — Encounter: Payer: Self-pay | Admitting: General Surgery

## 2013-09-10 ENCOUNTER — Other Ambulatory Visit: Payer: Self-pay | Admitting: General Surgery

## 2013-09-10 DIAGNOSIS — E785 Hyperlipidemia, unspecified: Secondary | ICD-10-CM

## 2013-09-11 ENCOUNTER — Encounter: Payer: Self-pay | Admitting: Medical

## 2013-09-11 ENCOUNTER — Ambulatory Visit (INDEPENDENT_AMBULATORY_CARE_PROVIDER_SITE_OTHER): Payer: Medicaid Other | Admitting: Medical

## 2013-09-11 VITALS — BP 122/80 | HR 66 | Temp 97.7°F | Resp 16 | Wt 287.0 lb

## 2013-09-11 DIAGNOSIS — I4891 Unspecified atrial fibrillation: Secondary | ICD-10-CM

## 2013-09-11 DIAGNOSIS — E785 Hyperlipidemia, unspecified: Secondary | ICD-10-CM

## 2013-09-11 DIAGNOSIS — I2510 Atherosclerotic heart disease of native coronary artery without angina pectoris: Secondary | ICD-10-CM

## 2013-09-11 DIAGNOSIS — R05 Cough: Secondary | ICD-10-CM

## 2013-09-11 DIAGNOSIS — J309 Allergic rhinitis, unspecified: Secondary | ICD-10-CM

## 2013-09-11 DIAGNOSIS — E119 Type 2 diabetes mellitus without complications: Secondary | ICD-10-CM

## 2013-09-11 DIAGNOSIS — H9190 Unspecified hearing loss, unspecified ear: Secondary | ICD-10-CM

## 2013-09-11 DIAGNOSIS — R059 Cough, unspecified: Secondary | ICD-10-CM

## 2013-09-11 DIAGNOSIS — G4733 Obstructive sleep apnea (adult) (pediatric): Secondary | ICD-10-CM

## 2013-09-11 LAB — POCT GLYCOSYLATED HEMOGLOBIN (HGB A1C): Hemoglobin A1C: 6.8

## 2013-09-11 MED ORDER — BENZONATATE 200 MG PO CAPS: 200.0000 mg | ORAL_CAPSULE | Freq: Three times a day (TID) | ORAL | Status: DC | PRN

## 2013-09-11 MED ORDER — METFORMIN HCL 1000 MG PO TABS: 1000.0000 mg | ORAL_TABLET | Freq: Two times a day (BID) | ORAL | Status: DC

## 2013-09-11 MED ORDER — FLUTICASONE PROPIONATE 50 MCG/ACT NA SUSP: 2.0000 | Freq: Every day | NASAL | Status: DC

## 2013-09-11 NOTE — Assessment & Plan Note (Addendum)
Patient is checking home blood sugars.   Home blood sugar records: checking.  Gets 125 on average when he checks.  Doesn't check daily.  Has given up sweet tea.  Trying to get some exercise. Has been walking 2-3 miles on some days per week on treadmill.   Current symptoms include: none. Patient denies none.  Patient is checking their feet daily. Foot concerns (callous, ulcer, wound, thickened nails, toenail fungus, skin fungus, hammer toe): none Last dilated eye exam long time  Current treatments: Continued metformin which has been effective. Medication compliance: good  Current diet: cut out sweet tea.  Eats a lot of vegetables. Eats occasional fried foods.    Current exercise: yard work Known diabetic complications: none

## 2013-09-11 NOTE — Assessment & Plan Note (Signed)
Followed by cardiology, just saw them in January

## 2013-09-11 NOTE — Assessment & Plan Note (Signed)
Sees cardiology, compliant with CPAP

## 2013-09-11 NOTE — Assessment & Plan Note (Signed)
Compliant with medication, had labs earlier this month per cardiology, at goal

## 2013-09-11 NOTE — Assessment & Plan Note (Signed)
Managed by cardiology, just saw them in January

## 2013-09-11 NOTE — Patient Instructions (Signed)
  Thank you for giving me the opportunity to serve you today.    Your diagnosis today includes: Encounter Diagnoses  Name Primary?  . Type II or unspecified type diabetes mellitus without mention of complication, not stated as uncontrolled Yes  . Atrial fibrillation   . CAD (coronary artery disease)   . Hyperlipidemia   . Obstructive sleep apnea   . Morbid obesity   . Allergic rhinitis   . Cough   . Hearing loss      Specific recommendations today include:  Continue your same medications including the metformin twice daily  We will refer you to nutritionist and diabetic education counseling to help with diabetes diet and weight loss  Work on losing weight through continued exercise and improved low-fat diet  Begin Flonase nasal spray 2 sprays per nostril daily to help with allergies  You can use Tessalon Perles for cough  If your cough and congestion not improving within a week let me know  We will refer you to audiologist to check hearing  Follow up: with referrals, 3 months with me

## 2013-09-11 NOTE — Assessment & Plan Note (Signed)
His current weight is about the same as a year ago.

## 2013-09-11 NOTE — Progress Notes (Signed)
Subjective:   [name] is an 63 y.o. male who presents for follow up of Type 2 diabetes mellitus.   Here for routine f/u but notes the cough came back again.  Saw me for bronchitis 06/2013, got better with medication.  He currently has cough, productive phlegm, x 2 wk.  He does note runny nose daily.  No other symptoms.  No fever, no NVD, no wheezing no sob, no sinus pressure.    ATRIAL FIBRILLATION Managed by cardiology, just saw them in January  CAD (coronary artery disease) Followed by cardiology, just saw them in January  Hyperlipidemia Compliant with medication, had labs earlier this month per cardiology, at goal  Obstructive sleep apnea Sees cardiology, compliant with CPAP  Type II or unspecified type diabetes mellitus without mention of complication, not stated as uncontrolled Patient is checking home blood sugars.   Home blood sugar records: checking.  Gets 125 on average when he checks.  Doesn't check daily.  Has given up sweet tea.  Trying to get some exercise. Has been walking 2-3 miles on some days per week on treadmill.   Current symptoms include: none. Patient denies none.  Patient is checking their feet daily. Foot concerns (callous, ulcer, wound, thickened nails, toenail fungus, skin fungus, hammer toe): none Last dilated eye exam long time  Current treatments: Continued metformin which has been effective. Medication compliance: good  Current diet: cut out sweet tea.  Eats a lot of vegetables. Eats occasional fried foods.    Current exercise: yard work Known diabetic complications: none  MORBID OBESITY His current weight is about the same as a year ago.     The following portions of the patient's history were reviewed and updated as appropriate: allergies, current medications, past family history, past medical history, past social history, past surgical history and problem list.  ROS as in subjective above    Objective:   Filed Vitals:   09/11/13 0853   BP: 122/80  Pulse: 66  Temp: 97.7 F (36.5 C)  Resp: 16    General appearance: alert, no distress, WD/WN, obese white male HEENT: normocephalic, sclerae anicteric, TMs flat,nares with turbiante edema and clear fluid, left turbinate occluding nare, pharynx normal Oral cavity: MMM, no lesions Neck: supple, no lymphadenopathy, no thyromegaly, no masses Heart: RRR, normal S1, S2, no murmurs Lungs: CTA bilaterally, no wheezes, rhonchi, or rales Abdomen: +bs, soft, reducible umbilical hernia present, non tender, non distended, no masses, no hepatomegaly, no splenomegaly Pulses: 2+ symmetric, upper and lower extremities, normal cap refill Ext: no edema See separate diabetic foot exam    Assessment:   Encounter Diagnoses  Name Primary?  . Type II or unspecified type diabetes mellitus without mention of complication, not stated as uncontrolled Yes  . Atrial fibrillation   . CAD (coronary artery disease)   . Hyperlipidemia   . Obstructive sleep apnea   . Morbid obesity   . Allergic rhinitis   . Cough   . Hearing loss      Plan:  Hemoglobin A1c 6.8% today. We discussed his weight, his diet, exercise, recommendations to improve on this   Updated chart record to show that he refused routine vaccinations despite counseling   Continue your same medications including the metformin twice daily  We will refer you to nutritionist and diabetic education counseling to help with diabetes diet and weight loss  Work on losing weight through continued exercise and improved low-fat diet  Begin Flonase nasal spray 2 sprays per nostril daily  to help with allergies  You can use Tessalon Perles for cough  If your cough and congestion not improving within a week let me know  We will refer you to audiologist to check hearing  Return in about 3 months (around 12/09/2013).

## 2013-09-25 ENCOUNTER — Telehealth: Payer: Self-pay | Admitting: Internal Medicine

## 2013-09-25 MED ORDER — VALACYCLOVIR HCL 500 MG PO TABS: 500.0000 mg | ORAL_TABLET | Freq: Every day | ORAL | Status: DC

## 2013-09-25 NOTE — Telephone Encounter (Signed)
pls send it in 

## 2013-09-25 NOTE — Telephone Encounter (Signed)
done

## 2013-09-25 NOTE — Telephone Encounter (Signed)
Refill request for valtrex 500mg  to [facility] pharmacy [place] [id number]

## 2013-09-30 ENCOUNTER — Ambulatory Visit (INDEPENDENT_AMBULATORY_CARE_PROVIDER_SITE_OTHER): Payer: Medicaid Other | Admitting: Pharmacist

## 2013-09-30 ENCOUNTER — Telehealth: Payer: Self-pay | Admitting: Pharmacist

## 2013-09-30 DIAGNOSIS — Z5181 Encounter for therapeutic drug level monitoring: Secondary | ICD-10-CM

## 2013-09-30 DIAGNOSIS — I6350 Cerebral infarction due to unspecified occlusion or stenosis of unspecified cerebral artery: Secondary | ICD-10-CM

## 2013-09-30 DIAGNOSIS — I4891 Unspecified atrial fibrillation: Secondary | ICD-10-CM

## 2013-09-30 LAB — POCT INR: INR: 1.9

## 2013-09-30 NOTE — Telephone Encounter (Signed)
What does patient state he has been taking

## 2013-09-30 NOTE — Telephone Encounter (Signed)
Pt has been taking on 1 tablet 30 MG daily. Pt states his BP on and off have been decent. He will drop his BP readings off in the next day or so.

## 2013-09-30 NOTE — Telephone Encounter (Signed)
Patient was on imdur 30 mg - 2 pills daily (looking at [facility] records), but our system shows 30 mg once daily now.  He got a prescription sent in 08/2013 for 30 mg once daily.  Patient wants to know if he should be taking 30 mg or 60 mg daily.  Please let Danielle know so she can notify patient.

## 2013-09-30 NOTE — Telephone Encounter (Signed)
[name] for now stay on dose he has been taking

## 2013-10-01 NOTE — Telephone Encounter (Signed)
Pt made aware

## 2013-10-05 ENCOUNTER — Telehealth: Payer: Self-pay | Admitting: Cardiology

## 2013-10-05 NOTE — Telephone Encounter (Signed)
Pt is aware.  

## 2013-10-05 NOTE — Telephone Encounter (Signed)
Patient brought in BP readings which range from 118-150/70-91.  Most BP readings are in normal range.  Continue current meds

## 2013-10-14 ENCOUNTER — Other Ambulatory Visit: Payer: Self-pay

## 2013-10-14 MED ORDER — EZETIMIBE 10 MG PO TABS: 10.0000 mg | ORAL_TABLET | Freq: Every day | ORAL | Status: DC

## 2013-10-29 ENCOUNTER — Other Ambulatory Visit: Payer: Self-pay | Admitting: Cardiology

## 2013-11-03 ENCOUNTER — Ambulatory Visit (INDEPENDENT_AMBULATORY_CARE_PROVIDER_SITE_OTHER): Payer: Medicaid Other | Admitting: *Deleted

## 2013-11-03 DIAGNOSIS — I6350 Cerebral infarction due to unspecified occlusion or stenosis of unspecified cerebral artery: Secondary | ICD-10-CM

## 2013-11-03 DIAGNOSIS — I4891 Unspecified atrial fibrillation: Secondary | ICD-10-CM

## 2013-11-03 DIAGNOSIS — Z5181 Encounter for therapeutic drug level monitoring: Secondary | ICD-10-CM

## 2013-11-03 LAB — POCT INR: INR: 2.7

## 2013-11-17 ENCOUNTER — Encounter: Payer: Medicaid Other | Attending: Medical

## 2013-11-17 VITALS — Ht 70.5 in | Wt 293.0 lb

## 2013-11-17 DIAGNOSIS — Z713 Dietary counseling and surveillance: Secondary | ICD-10-CM | POA: Insufficient documentation

## 2013-11-17 DIAGNOSIS — E119 Type 2 diabetes mellitus without complications: Secondary | ICD-10-CM

## 2013-11-19 NOTE — Progress Notes (Signed)
Patient was seen on 10/22/13 for the first of a series of three diabetes self-management courses at [facility].  Current HbA1c: 6.8%  The following learning objectives were met by the patient during this class:  Describe diabetes  State some common risk factors for diabetes  Defines the role of glucose and insulin  Identifies type of diabetes and pathophysiology  Describe the relationship between diabetes and cardiovascular risk  State the members of the Healthcare Team  States the rationale for glucose monitoring  State when to test glucose  State their individual Target Range  State the importance of logging glucose readings  Describe how to interpret glucose readings  Identifies A1C target  Explain the correlation between A1c and eAG values  State symptoms and treatment of high blood glucose  State symptoms and treatment of low blood glucose  Explain proper technique for glucose testing  Identifies proper sharps disposal  Handouts given during class include:  Living Well with Diabetes book  Carb Counting and Meal Planning book  Meal Plan Card  Carbohydrate guide  Meal planning worksheet  Low Sodium Flavoring Tips  The diabetes portion plate  Y1P to eAG Conversion Chart  Diabetes Medications  Diabetes Recommended Care Schedule  Support Group  Diabetes Success Plan  Core Class Satisfaction Survey  Follow-Up Plan:  Attend core 2

## 2013-11-21 ENCOUNTER — Other Ambulatory Visit: Payer: Self-pay | Admitting: Family Medicine

## 2013-11-23 ENCOUNTER — Other Ambulatory Visit: Payer: Self-pay | Admitting: Family Medicine

## 2013-11-23 ENCOUNTER — Telehealth: Payer: Self-pay | Admitting: Medical

## 2013-11-23 MED ORDER — GLUCOSE BLOOD VI STRP: 50.0000 | ORAL_STRIP | Status: DC | PRN

## 2013-11-23 NOTE — Telephone Encounter (Signed)
Patients refills sent to the pharmacy. CLS

## 2013-11-24 ENCOUNTER — Encounter: Payer: Medicaid Other | Attending: Family Medicine

## 2013-11-24 DIAGNOSIS — Z713 Dietary counseling and surveillance: Secondary | ICD-10-CM | POA: Insufficient documentation

## 2013-11-24 DIAGNOSIS — E119 Type 2 diabetes mellitus without complications: Secondary | ICD-10-CM | POA: Diagnosis not present

## 2013-11-25 NOTE — Progress Notes (Signed)

## 2013-11-25 NOTE — Patient Instructions (Signed)
Goals:  Follow Diabetes Meal Plan as instructed  Eat 3 meals and 2 snacks, every 3-5 hrs  Limit carbohydrate intake to 45-60 grams carbohydrate/meal  Limit carbohydrate intake to 15 grams carbohydrate/snack  Add lean protein foods to meals/snacks  Monitor glucose levels as instructed by your doctor  Aim for 30 mins of physical activity daily  Bring food record and glucose log to your next nutrition visit

## 2013-12-01 ENCOUNTER — Ambulatory Visit (INDEPENDENT_AMBULATORY_CARE_PROVIDER_SITE_OTHER): Payer: Medicaid Other | Admitting: Pharmacist Clinician (PhC)/ Clinical Pharmacy Specialist

## 2013-12-01 ENCOUNTER — Telehealth: Payer: Self-pay | Admitting: Medical

## 2013-12-01 ENCOUNTER — Other Ambulatory Visit: Payer: Self-pay | Admitting: Family Medicine

## 2013-12-01 DIAGNOSIS — I6350 Cerebral infarction due to unspecified occlusion or stenosis of unspecified cerebral artery: Secondary | ICD-10-CM

## 2013-12-01 DIAGNOSIS — I4891 Unspecified atrial fibrillation: Secondary | ICD-10-CM

## 2013-12-01 DIAGNOSIS — E119 Type 2 diabetes mellitus without complications: Secondary | ICD-10-CM

## 2013-12-01 DIAGNOSIS — Z713 Dietary counseling and surveillance: Secondary | ICD-10-CM | POA: Diagnosis not present

## 2013-12-01 DIAGNOSIS — Z5181 Encounter for therapeutic drug level monitoring: Secondary | ICD-10-CM

## 2013-12-01 LAB — POCT INR: INR: 1.7

## 2013-12-01 MED ORDER — VALACYCLOVIR HCL 500 MG PO TABS: 500.0000 mg | ORAL_TABLET | Freq: Every day | ORAL | Status: DC

## 2013-12-01 NOTE — Telephone Encounter (Signed)
Refill but if having frequent outbreaks, may want to come in and discuss preventative treatment

## 2013-12-01 NOTE — Telephone Encounter (Signed)
Rx refill was sent per Chana Bode [facility] and I left the patient a message on his voicemail. CLS

## 2013-12-03 NOTE — Progress Notes (Signed)
Patient was seen on 12/01/13 for the third of a series of three diabetes self-management courses at the [facility]. The following learning objectives were met by the patient during this class:    State the amount of activity recommended for healthy living   Describe activities suitable for individual needs   Identify ways to regularly incorporate activity into daily life   Identify barriers to activity and ways to over come these barriers  Identify diabetes medications being personally used and their primary action for lowering glucose and possible side effects   Describe role of stress on blood glucose and develop strategies to address psychosocial issues   Identify diabetes complications and ways to prevent them  Explain how to manage diabetes during illness   Evaluate success in meeting personal goal   Establish 2-3 goals that they will plan to diligently work on until they return for the  61-monthfollow-up visit  Goals:  Follow Diabetes Meal Plan as instructed  Aim for 15-30 mins of physical activity daily as tolerated  Bring food record and glucose log to your follow up visit  Your patient has established the following 4 month goals in their individualized success plan:  None noted  Your patient has identified these potential barriers to change:  None Noted  Your patient has identified their diabetes self-care support plan as  [facility]  Plan:  Attend Core 4 in 4 months

## 2013-12-29 ENCOUNTER — Ambulatory Visit (INDEPENDENT_AMBULATORY_CARE_PROVIDER_SITE_OTHER): Payer: Medicaid Other | Admitting: Pharmacist

## 2013-12-29 DIAGNOSIS — I4891 Unspecified atrial fibrillation: Secondary | ICD-10-CM

## 2013-12-29 DIAGNOSIS — I6350 Cerebral infarction due to unspecified occlusion or stenosis of unspecified cerebral artery: Secondary | ICD-10-CM

## 2013-12-29 DIAGNOSIS — Z5181 Encounter for therapeutic drug level monitoring: Secondary | ICD-10-CM

## 2013-12-29 LAB — POCT INR: INR: 1.9

## 2014-01-04 ENCOUNTER — Other Ambulatory Visit: Payer: Self-pay | Admitting: Internal Medicine

## 2014-01-05 ENCOUNTER — Other Ambulatory Visit: Payer: Self-pay

## 2014-01-05 MED ORDER — WARFARIN SODIUM 5 MG PO TABS: ORAL_TABLET | ORAL | Status: DC

## 2014-01-07 ENCOUNTER — Other Ambulatory Visit: Payer: Self-pay | Admitting: Internal Medicine

## 2014-02-16 ENCOUNTER — Other Ambulatory Visit: Payer: Self-pay | Admitting: Internal Medicine

## 2014-02-17 ENCOUNTER — Telehealth: Payer: Self-pay | Admitting: Internal Medicine

## 2014-02-17 MED ORDER — VALACYCLOVIR HCL 500 MG PO TABS: 500.0000 mg | ORAL_TABLET | Freq: Every day | ORAL | Status: DC

## 2014-02-17 NOTE — Telephone Encounter (Signed)
Refill request for valtrex 500mg  #30 to [facility]

## 2014-02-23 ENCOUNTER — Ambulatory Visit (INDEPENDENT_AMBULATORY_CARE_PROVIDER_SITE_OTHER): Payer: Medicaid Other | Admitting: Pharmacist Clinician (PhC)/ Clinical Pharmacy Specialist

## 2014-02-23 DIAGNOSIS — I6350 Cerebral infarction due to unspecified occlusion or stenosis of unspecified cerebral artery: Secondary | ICD-10-CM

## 2014-02-23 DIAGNOSIS — I4891 Unspecified atrial fibrillation: Secondary | ICD-10-CM

## 2014-02-23 DIAGNOSIS — Z5181 Encounter for therapeutic drug level monitoring: Secondary | ICD-10-CM

## 2014-02-23 LAB — POCT INR: INR: 1.8

## 2014-03-08 ENCOUNTER — Encounter: Payer: Self-pay | Admitting: Cardiology

## 2014-03-10 ENCOUNTER — Encounter: Payer: Self-pay | Admitting: General Surgery

## 2014-03-10 ENCOUNTER — Other Ambulatory Visit (INDEPENDENT_AMBULATORY_CARE_PROVIDER_SITE_OTHER): Payer: Medicaid Other

## 2014-03-10 DIAGNOSIS — E785 Hyperlipidemia, unspecified: Secondary | ICD-10-CM

## 2014-03-10 LAB — HEPATIC FUNCTION PANEL
ALT: 26 U/L (ref 0–53)
AST: 26 U/L (ref 0–37)
Albumin: 3.7 g/dL (ref 3.5–5.2)
Alkaline Phosphatase: 59 U/L (ref 39–117)
Bilirubin, Direct: 0 mg/dL (ref 0.0–0.3)
Total Bilirubin: 0.5 mg/dL (ref 0.2–1.2)
Total Protein: 7 g/dL (ref 6.0–8.3)

## 2014-03-10 LAB — LIPID PANEL
Cholesterol: 109 mg/dL (ref 0–200)
HDL: 32.6 mg/dL — ABNORMAL LOW (ref >39.00)
LDL Cholesterol: 51 mg/dL (ref 0–99)
NonHDL: 76.4
Total CHOL/HDL Ratio: 3
Triglycerides: 126 mg/dL (ref 0.0–149.0)
VLDL: 25.2 mg/dL (ref 0.0–40.0)

## 2014-03-16 ENCOUNTER — Ambulatory Visit (INDEPENDENT_AMBULATORY_CARE_PROVIDER_SITE_OTHER): Payer: Medicaid Other | Admitting: *Deleted

## 2014-03-16 DIAGNOSIS — I6350 Cerebral infarction due to unspecified occlusion or stenosis of unspecified cerebral artery: Secondary | ICD-10-CM

## 2014-03-16 DIAGNOSIS — I4891 Unspecified atrial fibrillation: Secondary | ICD-10-CM

## 2014-03-16 DIAGNOSIS — Z5181 Encounter for therapeutic drug level monitoring: Secondary | ICD-10-CM

## 2014-03-16 LAB — POCT INR: INR: 2

## 2014-03-18 ENCOUNTER — Other Ambulatory Visit: Payer: Self-pay | Admitting: Internal Medicine

## 2014-03-23 LAB — HM DIABETES EYE EXAM

## 2014-03-24 ENCOUNTER — Telehealth: Payer: Self-pay

## 2014-03-24 NOTE — Telephone Encounter (Signed)
[facility] PA was approved for zetiaReference # [id number]

## 2014-03-30 ENCOUNTER — Encounter: Payer: Self-pay | Admitting: Internal Medicine

## 2014-04-02 ENCOUNTER — Ambulatory Visit (INDEPENDENT_AMBULATORY_CARE_PROVIDER_SITE_OTHER): Payer: Medicaid Other | Admitting: Pharmacist

## 2014-04-02 DIAGNOSIS — I4891 Unspecified atrial fibrillation: Secondary | ICD-10-CM

## 2014-04-02 DIAGNOSIS — I6350 Cerebral infarction due to unspecified occlusion or stenosis of unspecified cerebral artery: Secondary | ICD-10-CM

## 2014-04-02 DIAGNOSIS — Z5181 Encounter for therapeutic drug level monitoring: Secondary | ICD-10-CM

## 2014-04-02 LAB — POCT INR: INR: 2.3

## 2014-04-18 ENCOUNTER — Other Ambulatory Visit: Payer: Self-pay | Admitting: Cardiology

## 2014-04-18 ENCOUNTER — Other Ambulatory Visit: Payer: Self-pay | Admitting: Family Medicine

## 2014-04-19 NOTE — Telephone Encounter (Signed)
THIS IS YOUR PT °

## 2014-04-19 NOTE — Telephone Encounter (Signed)
Is this okay to refill? 

## 2014-04-23 ENCOUNTER — Encounter (INDEPENDENT_AMBULATORY_CARE_PROVIDER_SITE_OTHER): Payer: Medicaid Other | Admitting: Ophthalmology

## 2014-04-23 DIAGNOSIS — H35033 Hypertensive retinopathy, bilateral: Secondary | ICD-10-CM

## 2014-04-23 DIAGNOSIS — E11319 Type 2 diabetes mellitus with unspecified diabetic retinopathy without macular edema: Secondary | ICD-10-CM

## 2014-04-23 DIAGNOSIS — E11329 Type 2 diabetes mellitus with mild nonproliferative diabetic retinopathy without macular edema: Secondary | ICD-10-CM

## 2014-04-23 DIAGNOSIS — H43813 Vitreous degeneration, bilateral: Secondary | ICD-10-CM

## 2014-04-23 DIAGNOSIS — H34812 Central retinal vein occlusion, left eye: Secondary | ICD-10-CM

## 2014-04-30 ENCOUNTER — Other Ambulatory Visit: Payer: Self-pay

## 2014-05-03 ENCOUNTER — Encounter: Payer: Self-pay | Admitting: Internal Medicine

## 2014-05-03 ENCOUNTER — Ambulatory Visit (INDEPENDENT_AMBULATORY_CARE_PROVIDER_SITE_OTHER): Payer: Medicaid Other

## 2014-05-03 ENCOUNTER — Ambulatory Visit (INDEPENDENT_AMBULATORY_CARE_PROVIDER_SITE_OTHER): Payer: Medicaid Other | Admitting: Internal Medicine

## 2014-05-03 VITALS — BP 130/92 | HR 75 | Ht 70.0 in | Wt 275.0 lb

## 2014-05-03 DIAGNOSIS — I6350 Cerebral infarction due to unspecified occlusion or stenosis of unspecified cerebral artery: Secondary | ICD-10-CM

## 2014-05-03 DIAGNOSIS — G4733 Obstructive sleep apnea (adult) (pediatric): Secondary | ICD-10-CM

## 2014-05-03 DIAGNOSIS — I480 Paroxysmal atrial fibrillation: Secondary | ICD-10-CM

## 2014-05-03 DIAGNOSIS — Z5181 Encounter for therapeutic drug level monitoring: Secondary | ICD-10-CM

## 2014-05-03 DIAGNOSIS — I639 Cerebral infarction, unspecified: Secondary | ICD-10-CM

## 2014-05-03 DIAGNOSIS — I4891 Unspecified atrial fibrillation: Secondary | ICD-10-CM

## 2014-05-03 LAB — POCT INR: INR: 2.6

## 2014-05-03 NOTE — Progress Notes (Signed)
PCP: Wyatt Haste, MD Primary Cardiologist:  Dr Elberta Fortis is a 63 y.o. male who presents today for routine electrophysiology followup.  Since last being seen in our clinic, the patient reports doing very well.  He has had no symptoms of afib.  Today, he denies symptoms of palpitations, chest pain, shortness of breath,  lower extremity edema, dizziness, presyncope, or syncope.  The patient is otherwise without complaint today.   Past Medical History  Diagnosis Date  . Cerebrovascular accident     secondary to cardioembolic event Sept 9528  . Central retinal artery occlusion, left eye     diagnosed in 2008  . Obstructive sleep apnea   . Morbid obesity   . Skin infection     chronic left leg herpetic  . Bell's palsy   . Diabetes mellitus 11/14/2010    TYPE II  . Obesity   . CHF (congestive heart failure)     history of  . 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 Estill Dooms, Urology  . 23-polyvalent pneumococcal polysaccharide vaccine declined 05/2013  . Refused influenza vaccine 05/2013  . Hypertension   . Hyperlipidemia   . Persistent atrial fibrillation     s/p afib ablation by JA 08/01/08  . Atrial flutter     atypical atrial flutter post PVI  . CAD (coronary artery disease) 07/2009    chronically occluded RCA with right to left collaterals s/p failed PCI  . Pneumococcal vaccine refused 08/2013  . Influenza vaccine refused 08/2013  . Vaccine refused by patient 08/2013    zostavax, tdap  . Recommendation refused by patient 08/2013    multiple recommended vaccines refused  . Hematuria 05/2013    Urology, Dr. Estill Dooms   Past Surgical History  Procedure Laterality Date  . Atrial ablation surgery  08/01/08    CTI and PVI ablation by JA  . Colonoscopy      2012 per patient    Current Outpatient Prescriptions  Medication Sig Dispense Refill  . ACCU-CHEK FASTCLIX LANCETS MISC USE TO TEST BLOOD SUGAR DAILY  102 each  1  . aspirin (ASPIR-81) 81 MG EC tablet Take 81 mg by  mouth daily.        [name] atorvastatin (LIPITOR) 80 MG tablet Take 1 tablet (80 mg total) by mouth daily.  30 tablet  11  . b complex vitamins capsule Take 1 capsule by mouth daily.      . Cholecalciferol (VITAMIN D3) 10000 UNITS capsule OTC Take 10,000-20,000 units daily      . glucose blood (ACCU-CHEK AVIVA PLUS) test strip 50 each by Other route as needed for other. Use as instructed  50 each  10  . isosorbide mononitrate (IMDUR) 30 MG 24 hr tablet TAKE ONE TABLET BY MOUTH ONCE DAILY  30 tablet  0  . metFORMIN (GLUCOPHAGE) 1000 MG tablet Take 1 tablet (1,000 mg total) by mouth 2 (two) times daily with a meal.  180 tablet  3  . metoprolol (LOPRESSOR) 50 MG tablet TAKE ONE TABLET BY MOUTH TWICE DAILY  60 tablet  5  . Omega-3 Fatty Acids (FISH OIL) 1000 MG CAPS Take 2 capsules (2,000 mg total) by mouth daily.    0  . sotalol (BETAPACE) 160 MG tablet TAKE ONE TABLET BY MOUTH TWICE DAILY      . Testosterone (ANDROGEL PUMP) 1.25 GM/ACT (1%) GEL Place onto the skin.        . valACYclovir (VALTREX) 500 MG tablet TAKE ONE TABLET BY MOUTH  ONCE DAILY  30 tablet  2  . valsartan (DIOVAN) 160 MG tablet TAKE ONE TABLET BY MOUTH ONCE DAILY  30 tablet  0  . warfarin (COUMADIN) 5 MG tablet Take as directed by [facility] Clinic  40 tablet  5  . nitroGLYCERIN (NITROSTAT) 0.4 MG SL tablet Place 0.4 mg under the tongue every 5 (five) minutes as needed.         No current facility-administered medications for this visit.   ROS- all systems are reviewed and negative except as per HPI above  Physical Exam: Filed Vitals:   05/03/14 1014  BP: 130/92  Pulse: 75  Height: 5\' 10"  (1.778 m)  Weight: 275 lb (124.739 kg)    GEN- The patient is well appearing, alert and oriented x 3 today.   Head- normocephalic, atraumatic Eyes-  Sclera clear, conjunctiva pink Ears- hearing intact Oropharynx- clear Lungs- Clear to ausculation bilaterally, normal work of breathing Heart- Regular rate and rhythm, no murmurs, rubs or  gallops, PMI not laterally displaced GI- soft, NT, ND, + BS Extremities- no clubbing, cyanosis, or edema  ekg today reveals sinus rhythm 68 bpm, septal infarct  Assessment and Plan:  1. Afib/ atrial flutter Well controlled Continue long term anticoagulation with coumadin Would consider stopping ASA--> I will defer to Dr Radford Pax  2. Obesity Body mass index is 39.46 kg/(m^2). I am very happy that he has lost 20 lbs since I saw him last!  3. OSA Compliant with CPAP  4. HTN Stable No change required today   Return in 1 year

## 2014-05-03 NOTE — Patient Instructions (Signed)
Your physician wants you to follow-up in: 12 months with Dr. Allred. You will receive a reminder letter in the mail two months in advance. If you don't receive a letter, please call our office to schedule the follow-up appointment.  Your physician recommends that you continue on your current medications as directed. Please refer to the Current Medication list given to you today.  

## 2014-05-20 ENCOUNTER — Other Ambulatory Visit: Payer: Self-pay | Admitting: Internal Medicine

## 2014-05-20 ENCOUNTER — Other Ambulatory Visit: Payer: Self-pay | Admitting: Cardiology

## 2014-05-22 ENCOUNTER — Other Ambulatory Visit: Payer: Self-pay

## 2014-05-22 MED ORDER — ATORVASTATIN CALCIUM 80 MG PO TABS: 80.0000 mg | ORAL_TABLET | Freq: Every day | ORAL | Status: DC

## 2014-05-25 ENCOUNTER — Ambulatory Visit (INDEPENDENT_AMBULATORY_CARE_PROVIDER_SITE_OTHER): Payer: Medicaid Other

## 2014-05-25 ENCOUNTER — Encounter: Payer: Self-pay | Admitting: Cardiology

## 2014-05-25 ENCOUNTER — Ambulatory Visit (INDEPENDENT_AMBULATORY_CARE_PROVIDER_SITE_OTHER): Payer: Medicaid Other | Admitting: Cardiology

## 2014-05-25 VITALS — BP 130/86 | HR 64 | Ht 71.0 in | Wt 275.2 lb

## 2014-05-25 DIAGNOSIS — I6350 Cerebral infarction due to unspecified occlusion or stenosis of unspecified cerebral artery: Secondary | ICD-10-CM

## 2014-05-25 DIAGNOSIS — I639 Cerebral infarction, unspecified: Secondary | ICD-10-CM

## 2014-05-25 DIAGNOSIS — I4891 Unspecified atrial fibrillation: Secondary | ICD-10-CM

## 2014-05-25 DIAGNOSIS — I2510 Atherosclerotic heart disease of native coronary artery without angina pectoris: Secondary | ICD-10-CM

## 2014-05-25 DIAGNOSIS — E785 Hyperlipidemia, unspecified: Secondary | ICD-10-CM

## 2014-05-25 DIAGNOSIS — I480 Paroxysmal atrial fibrillation: Secondary | ICD-10-CM

## 2014-05-25 DIAGNOSIS — I10 Essential (primary) hypertension: Secondary | ICD-10-CM

## 2014-05-25 DIAGNOSIS — Z5181 Encounter for therapeutic drug level monitoring: Secondary | ICD-10-CM

## 2014-05-25 DIAGNOSIS — G4733 Obstructive sleep apnea (adult) (pediatric): Secondary | ICD-10-CM

## 2014-05-25 DIAGNOSIS — I2584 Coronary atherosclerosis due to calcified coronary lesion: Secondary | ICD-10-CM

## 2014-05-25 LAB — BASIC METABOLIC PANEL
BUN: 14 mg/dL (ref 6–23)
CO2: 22 mEq/L (ref 19–32)
Calcium: 9.3 mg/dL (ref 8.4–10.5)
Chloride: 105 mEq/L (ref 96–112)
Creatinine, Ser: 0.8 mg/dL (ref 0.4–1.5)
GFR: 100.62 mL/min (ref >60.00)
Glucose, Bld: 98 mg/dL (ref 70–99)
Potassium: 4.2 mEq/L (ref 3.5–5.1)
Sodium: 137 mEq/L (ref 135–145)

## 2014-05-25 LAB — POCT INR: INR: 2.1

## 2014-05-25 NOTE — Progress Notes (Signed)
[place], [address] [place], [place]  [address] Phone: [phone] Fax:  [phone]  Date:  05/25/2014   ID:  [name], DOB 04-26-1951, MRN [id number]  PCP:  Wyatt Haste, MD  Cardiologist:  Fransico Him, MD    History of Present Illness: [name] is a 63 y.o. male with a history of ASCAD, HTN, OSA, morbid obesity, PAF, dyslipidemia who presents today for followup. He is doing well. He denies any chest pain, LE edema, palpitations or syncope.He occasionally has some DOE with extreme exertion.  Last week he had some problems with vertigo which have resolved. He tolerates his CPAP well. He tolerates the pressure and mask without problems. He feels rested when he gets up and has no daytime sleepiness.  He has had a mask leak recently but he is growing his beard out for Christmas.   Wt Readings from Last 3 Encounters:  05/25/14 275 lb 3.2 oz (124.83 kg)  05/03/14 275 lb (124.739 kg)  11/19/13 293 lb (132.904 kg)     Past Medical History  Diagnosis Date  . Cerebrovascular accident     secondary to cardioembolic event Sept 5732  . Central retinal artery occlusion, left eye     diagnosed in 2008  . Obstructive sleep apnea   . Morbid obesity   . Skin infection     chronic left leg herpetic  . Bell's palsy   . Diabetes mellitus 11/14/2010    TYPE II  . Obesity   . CHF (congestive heart failure)     history of  . 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 Estill Dooms, Urology  . 23-polyvalent pneumococcal polysaccharide vaccine declined 05/2013  . Refused influenza vaccine 05/2013  . Hypertension   . Hyperlipidemia   . Persistent atrial fibrillation     s/p afib ablation by JA 08/01/08  . Atrial flutter     atypical atrial flutter post PVI  . CAD (coronary artery disease) 07/2009    chronically occluded RCA with right to left collaterals s/p failed PCI  . Pneumococcal vaccine refused 08/2013  . Influenza vaccine refused 08/2013  . Vaccine refused by patient 08/2013      zostavax, tdap  . Recommendation refused by patient 08/2013    multiple recommended vaccines refused  . Hematuria 05/2013    Urology, Dr. Estill Dooms    Current Outpatient Prescriptions  Medication Sig Dispense Refill  . ACCU-CHEK FASTCLIX LANCETS MISC USE TO TEST BLOOD SUGAR DAILY 102 each 1  . aspirin (ASPIR-81) 81 MG EC tablet Take 81 mg by mouth daily.      [name] atorvastatin (LIPITOR) 80 MG tablet Take 1 tablet (80 mg total) by mouth daily. 30 tablet 0  . b complex vitamins capsule Take 1 capsule by mouth daily.    . Cholecalciferol (VITAMIN D3) 10000 UNITS capsule OTC Take 10,000-20,000 units daily    . glucose blood (ACCU-CHEK AVIVA PLUS) test strip 50 each by Other route as needed for other. Use as instructed 50 each 10  . isosorbide mononitrate (IMDUR) 30 MG 24 hr tablet TAKE ONE TABLET BY MOUTH ONCE DAILY 30 tablet 0  . metFORMIN (GLUCOPHAGE) 1000 MG tablet Take 1 tablet (1,000 mg total) by mouth 2 (two) times daily with a meal. 180 tablet 3  . metoprolol (LOPRESSOR) 50 MG tablet TAKE ONE TABLET BY MOUTH TWICE DAILY 60 tablet 0  . nitroGLYCERIN (NITROSTAT) 0.4 MG SL tablet Place 0.4 mg under the tongue every 5 (five) minutes as needed.      [name]  Omega-3 Fatty Acids (FISH OIL) 1000 MG CAPS Take 2 capsules (2,000 mg total) by mouth daily.  0  . sotalol (BETAPACE) 160 MG tablet Take 1 tablet (160 mg total) by mouth 2 (two) times daily. 60 tablet 11  . Testosterone (ANDROGEL PUMP) 1.25 GM/ACT (1%) GEL Place onto the skin.      . valACYclovir (VALTREX) 500 MG tablet TAKE ONE TABLET BY MOUTH ONCE DAILY 30 tablet 2  . valsartan (DIOVAN) 160 MG tablet TAKE ONE TABLET BY MOUTH ONCE DAILY 30 tablet 0  . warfarin (COUMADIN) 5 MG tablet Take as directed by [facility] 40 tablet 5   No current facility-administered medications for this visit.    Allergies:    Allergies  Allergen Reactions  . Crestor [Rosuvastatin] Other (See Comments)    Made him feel crazy  . Zetia [Ezetimibe] Other (See  Comments)    Made him feel crazy    Social History:  The patient  reports that he quit smoking about 34 years ago. He does not have any smokeless tobacco history on file. He reports that he does not drink alcohol or use illicit drugs.   Family History:  The patient's family history includes Heart disease in his father and mother; Stroke in his father.   ROS:  Please see the history of present illness.      All other systems reviewed and negative.   PHYSICAL EXAM: VS:  BP 130/86 mmHg  Pulse 64  Ht 5\' 11"  (1.803 m)  Wt 275 lb 3.2 oz (124.83 kg)  BMI 38.40 kg/m2 Well nourished, well developed, in no acute distress HEENT: normal Neck: no JVD Cardiac:  normal S1, S2; RRR; no murmur Lungs:  clear to auscultation bilaterally, no wheezing, rhonchi or rales Abd: soft, nontender, no hepatomegaly Ext: no edema Skin: warm and dry Neuro:  CNs 2-12 intact, no focal abnormalities noted     ASSESSMENT AND PLAN:  1.  ASCAD with chronically occluded RCA with left to right collaterals s/p failed PCI with no angina - continue Imdur.              - stop ASA since he is on Coumadin 2.  HTN - controlled - continue Diovan/Lopressor - check BMET 3. Dyslipidemia - LDL at goal at 51 - continue fish oil/Zetia/atorvastatin 4. OSA on CPAP - He will drop off his SD card for a d/l 5. Obesity - I encouraged him to try to get into a walking exercise program and follow a low fat diet and portion control 6.   PAF and atypical atrial flutter s/p PVI maintaining NSR. Continue Sotolol /warfarin  Followup with me in 6 months    Signed, Fransico Him, MD [facility] 05/25/2014 11:16 AM

## 2014-05-25 NOTE — Patient Instructions (Signed)
Your physician wants you to follow-up in: 6 months with Dr. Radford Pax. You will receive a reminder letter in the mail two months in advance. If you don't receive a letter, please call our office to schedule the follow-up appointment.  Your physician recommends that you have lab work TODAY (BMET).  Your physician recommends that you continue on your current medications as directed. Please refer to the Current Medication list given to you today.

## 2014-06-16 ENCOUNTER — Other Ambulatory Visit: Payer: Self-pay | Admitting: Cardiology

## 2014-06-22 ENCOUNTER — Ambulatory Visit (INDEPENDENT_AMBULATORY_CARE_PROVIDER_SITE_OTHER): Payer: Medicaid Other | Admitting: Medical

## 2014-06-22 ENCOUNTER — Encounter: Payer: Self-pay | Admitting: Medical

## 2014-06-22 VITALS — BP 120/70 | HR 62 | Temp 97.7°F | Resp 16 | Wt 269.0 lb

## 2014-06-22 DIAGNOSIS — IMO0002 Reserved for concepts with insufficient information to code with codable children: Secondary | ICD-10-CM | POA: Insufficient documentation

## 2014-06-22 DIAGNOSIS — E669 Obesity, unspecified: Secondary | ICD-10-CM

## 2014-06-22 DIAGNOSIS — E291 Testicular hypofunction: Secondary | ICD-10-CM

## 2014-06-22 DIAGNOSIS — E782 Mixed hyperlipidemia: Secondary | ICD-10-CM

## 2014-06-22 DIAGNOSIS — E1165 Type 2 diabetes mellitus with hyperglycemia: Secondary | ICD-10-CM | POA: Insufficient documentation

## 2014-06-22 DIAGNOSIS — I10 Essential (primary) hypertension: Secondary | ICD-10-CM

## 2014-06-22 DIAGNOSIS — H9190 Unspecified hearing loss, unspecified ear: Secondary | ICD-10-CM

## 2014-06-22 DIAGNOSIS — I2510 Atherosclerotic heart disease of native coronary artery without angina pectoris: Secondary | ICD-10-CM

## 2014-06-22 DIAGNOSIS — G4733 Obstructive sleep apnea (adult) (pediatric): Secondary | ICD-10-CM

## 2014-06-22 DIAGNOSIS — B009 Herpesviral infection, unspecified: Secondary | ICD-10-CM

## 2014-06-22 LAB — POCT GLYCOSYLATED HEMOGLOBIN (HGB A1C): Hemoglobin A1C: 6.2

## 2014-06-22 MED ORDER — METFORMIN HCL 1000 MG PO TABS: 1000.0000 mg | ORAL_TABLET | Freq: Two times a day (BID) | ORAL | Status: DC

## 2014-06-22 MED ORDER — VALACYCLOVIR HCL 500 MG PO TABS: 500.0000 mg | ORAL_TABLET | Freq: Every day | ORAL | Status: DC

## 2014-06-22 MED ORDER — OMEGA-3-ACID ETHYL ESTERS 1 G PO CAPS: 2.0000 g | ORAL_CAPSULE | Freq: Two times a day (BID) | ORAL | Status: DC

## 2014-06-22 NOTE — Progress Notes (Signed)
Subjective:   [name] is an 63 y.o. male who presents for follow up of Type 2 diabetes mellitus, last visit for the same was 08/2013.  He recently saw eye doctor and cardiologist.  His cardiologist manages his lipids, blood pressure, and heart disease and associated medications.  Patient is checking home blood sugars once weekly on average. Home blood sugar records: 96 to 175.  Fasting - not sure, lowest is 96.   Current symptoms include: none. Patient denies none.  Patient is checking their feet daily. Foot concerns (callous, ulcer, wound, thickened nails, toenail fungus, skin fungus, hammer toe): no concerns Last dilated eye exam  05/2014, sees Dr. Zigmund Daniel  Current treatments: Metformin 1000mg  BID. Medication compliance: good  Current diet: in general, a "healthy" diet   eats greens, fried food maybe once monthly, does crock pot meals, baked meals, has cut back on sweet tea.  Eats a good amount of red meat.  Current exercise: walking some Known diabetic complications: heart disease.  After his last visit in 08/2013 we referred to nutritionist for help with diet, and audiologist due to hearing loss.   He saw nutritoinsit for 3 visits, last visit 11/2013.  He never saw audiologist, wants to defer this til after 08/2014.    At his last visit in 08/2013, he declined all vaccines.  He says he has seen too many people get problems and hospitalizations with vaccines.   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  OSA - using CPAP regularly.  The following portions of the patient's history were reviewed and updated as appropriate: allergies, current medications, past family history, past medical history, past social history, past surgical history and problem list.  ROS as in subjective above    Objective:   BP 120/70 mmHg  Pulse 62  Temp(Src) 97.7 F (36.5 C) (Oral)  Resp 16  Wt 269 lb (122.018  kg)  Wt Readings from Last 3 Encounters:  06/22/14 269 lb (122.018 kg)  05/25/14 275 lb 3.2 oz (124.83 kg)  05/03/14 275 lb (124.739 kg)   General appearance: alert, no distress, WD/WN Neck: supple, no lymphadenopathy, no thyromegaly, no masses Heart: RRR, normal S1, S2, no murmurs Lungs: CTA bilaterally, no wheezes, rhonchi, or rales Abdomen: +bs, soft, non tender, non distended, no masses, no hepatomegaly, no splenomegaly Pulses: 2+ symmetric, upper and lower extremities, normal cap refill Foot exam unremarkable, see separate foot exam      Assessment:   Encounter Diagnoses  Name Primary?  . Diabetes type 2, uncontrolled Yes  . Obesity   . OSA (obstructive sleep apnea)   . Hearing loss, unspecified laterality   . CVD (cardiovascular disease)   . Essential hypertension   . Mixed dyslipidemia   . Hypogonadism in male   . Herpetic lesion       Plan:   Diabetes Mellitus type 2 -hemoglobin A1c 6.2% today, we'll check a micro albumin. Continue metformin 1000 milligrams twice daily, continue work on improving diet, more frequent exercise.  C/t daily foot checks, we will try and get a copy of his most recent eye doctor visit last month with Dr. Zigmund Daniel.   OSA - compliant with CPAP, recently turned in his SD card to cardiology for compliance report  Hearing loss-he will call back in February for updated referral to audiology  Cardiovascular disease, hypertension - reviewed cardiology notes from 05/25/2014.  Sees Dr. Radford Pax who manages his lipids,  BP, heart medications.   On coumadin, sees coumadin clinic.   I clarified his medications.  He is aware not to take aspirin and coumadin.   There was confusion on the medication log today.  Mixed dyslipidemia -reviewed his most recent lipid panel results.  c/t current statin, stop OTC fish oil, begin Lovaza.   Of note, he is not taking Zetia due to prior side effects.   discussed diet, good fats, avoiding red meat and high cholesterol  foods.   He uses some discretion.   Hypogonadism-on AndroGel, managed by Dr.Eskew  Continue Valtrex daily for suppression of leg lesion  He continues to decline all vaccines  F/u pending labs

## 2014-06-23 LAB — MICROALBUMIN / CREATININE URINE RATIO
Creatinine, Urine: 144.2 mg/dL
Microalb Creat Ratio: 15.3 mg/g (ref 0.0–30.0)
Microalb, Ur: 2.2 mg/dL — ABNORMAL HIGH (ref <2.0)

## 2014-06-26 ENCOUNTER — Telehealth: Payer: Self-pay | Admitting: Medical

## 2014-06-30 ENCOUNTER — Telehealth: Payer: Self-pay | Admitting: *Deleted

## 2014-06-30 NOTE — Telephone Encounter (Signed)
PA for Diovan sent via CoverMyMeds

## 2014-07-05 NOTE — Telephone Encounter (Signed)
[place] did go thru insurance & pt picked up

## 2014-07-06 ENCOUNTER — Ambulatory Visit (INDEPENDENT_AMBULATORY_CARE_PROVIDER_SITE_OTHER): Payer: Medicaid Other

## 2014-07-06 DIAGNOSIS — Z5181 Encounter for therapeutic drug level monitoring: Secondary | ICD-10-CM

## 2014-07-06 DIAGNOSIS — I639 Cerebral infarction, unspecified: Secondary | ICD-10-CM

## 2014-07-06 DIAGNOSIS — I6350 Cerebral infarction due to unspecified occlusion or stenosis of unspecified cerebral artery: Secondary | ICD-10-CM

## 2014-07-06 DIAGNOSIS — I4891 Unspecified atrial fibrillation: Secondary | ICD-10-CM

## 2014-07-06 LAB — POCT INR: INR: 2.4

## 2014-07-09 MED ORDER — LISINOPRIL 20 MG PO TABS: 20.0000 mg | ORAL_TABLET | Freq: Every day | ORAL | Status: DC

## 2014-07-09 NOTE — Telephone Encounter (Signed)
Please find out what AR[facility] his insurance will cover

## 2014-07-09 NOTE — Telephone Encounter (Signed)
PA for Diovan was denied, patient is out of his BP medication. Please advise.

## 2014-07-09 NOTE — Telephone Encounter (Signed)
Change to Lisinopril 20mg  daily from Valsartan and have nurse visit in 1 week for BP check

## 2014-07-09 NOTE — Telephone Encounter (Signed)
Advised patient of medication change and that the new prescription had been sent to [facility]. He states he will be out-of-town for Christmas but will call back next week to set up an appointment for the nurse visit and will be checking his BP at home. He was agreeable to this.

## 2014-07-09 NOTE — Telephone Encounter (Signed)
Called Medicaid, they either want the patient to try an ACE first or have medical reason why he can't take an ACE. The next step after either of those would be brand Diovan or Losartan, either of these still require a PA.

## 2014-07-21 ENCOUNTER — Other Ambulatory Visit: Payer: Self-pay | Admitting: Cardiology

## 2014-07-29 ENCOUNTER — Other Ambulatory Visit: Payer: Self-pay | Admitting: Cardiology

## 2014-08-02 ENCOUNTER — Other Ambulatory Visit: Payer: Self-pay | Admitting: Family Medicine

## 2014-08-23 ENCOUNTER — Other Ambulatory Visit: Payer: Self-pay | Admitting: Cardiology

## 2014-08-26 ENCOUNTER — Ambulatory Visit (INDEPENDENT_AMBULATORY_CARE_PROVIDER_SITE_OTHER): Payer: Medicaid Other | Admitting: *Deleted

## 2014-08-26 DIAGNOSIS — I639 Cerebral infarction, unspecified: Secondary | ICD-10-CM

## 2014-08-26 DIAGNOSIS — Z5181 Encounter for therapeutic drug level monitoring: Secondary | ICD-10-CM

## 2014-08-26 DIAGNOSIS — I6350 Cerebral infarction due to unspecified occlusion or stenosis of unspecified cerebral artery: Secondary | ICD-10-CM

## 2014-08-26 DIAGNOSIS — I4891 Unspecified atrial fibrillation: Secondary | ICD-10-CM

## 2014-08-26 LAB — POCT INR: INR: 1.9

## 2014-08-30 ENCOUNTER — Other Ambulatory Visit: Payer: Self-pay | Admitting: *Deleted

## 2014-08-30 MED ORDER — WARFARIN SODIUM 5 MG PO TABS: ORAL_TABLET | ORAL | Status: DC

## 2014-09-08 ENCOUNTER — Other Ambulatory Visit: Payer: Self-pay

## 2014-09-08 MED ORDER — LISINOPRIL 20 MG PO TABS: 20.0000 mg | ORAL_TABLET | Freq: Every day | ORAL | Status: DC

## 2014-09-08 NOTE — Telephone Encounter (Signed)
Spoke with pt. Lisinopril 20 mg has been refilled but pt is aware he has to come to nurse visit BP check on 3/3 @ 11 am in order to keep getting refills.

## 2014-09-13 ENCOUNTER — Telehealth: Payer: Self-pay | Admitting: Cardiology

## 2014-09-13 NOTE — Telephone Encounter (Signed)
He has ACE I induced cough so needs to go back on Diovan 160mg  daily

## 2014-09-13 NOTE — Telephone Encounter (Signed)
Patient st he is intolerant to Lisinopril. He stays up at night coughing and has had to use [facility] almost constantly since restarting the medication. He is not opposed to starting a new medication but st he cannot take the Lisinopril.   To Dr. Radford Pax.

## 2014-09-13 NOTE — Telephone Encounter (Signed)
New message    Pt C/O medication issue:  1. Name of Medication: lisinopril  20 mg   2. How are you currently taking this medication (dosage and times per day)? Once a day.   3. Are you having a reaction (difficulty breathing--STAT)?  Left Eye watery ,cough,   4. What is your medication issue?  Looking for different medicine.   patient states he did not take his med's today

## 2014-09-14 MED ORDER — VALSARTAN 160 MG PO TABS: 160.0000 mg | ORAL_TABLET | Freq: Every day | ORAL | Status: DC

## 2014-09-14 NOTE — Telephone Encounter (Signed)
Instructed patient to STOP lisinopril and START Diovan 160 mg daily. Patient agrees with treatment plan.

## 2014-09-23 ENCOUNTER — Ambulatory Visit (INDEPENDENT_AMBULATORY_CARE_PROVIDER_SITE_OTHER): Payer: Medicaid Other | Admitting: Pharmacist

## 2014-09-23 ENCOUNTER — Ambulatory Visit (INDEPENDENT_AMBULATORY_CARE_PROVIDER_SITE_OTHER): Payer: Medicaid Other

## 2014-09-23 VITALS — BP 138/82 | HR 80 | Resp 20 | Wt 280.0 lb

## 2014-09-23 DIAGNOSIS — I639 Cerebral infarction, unspecified: Secondary | ICD-10-CM

## 2014-09-23 DIAGNOSIS — I4891 Unspecified atrial fibrillation: Secondary | ICD-10-CM

## 2014-09-23 DIAGNOSIS — Z5181 Encounter for therapeutic drug level monitoring: Secondary | ICD-10-CM

## 2014-09-23 DIAGNOSIS — I10 Essential (primary) hypertension: Secondary | ICD-10-CM

## 2014-09-23 DIAGNOSIS — I6350 Cerebral infarction due to unspecified occlusion or stenosis of unspecified cerebral artery: Secondary | ICD-10-CM

## 2014-09-23 LAB — POCT INR: INR: 1.8

## 2014-09-23 NOTE — Progress Notes (Signed)
1.) Reason for visit: BP check  2.) Name of MD requesting visit: Dr. Radford Pax  3.) H&P: Patient has history of CVA, DM, CHF, HTN, hyperlipidemia, A. Fib, and CAD. Patient came in for BP check since starting back on Diovan 160 mg by mouth daily. Patient's Lisinopril was discontinue due to side effects of coughing and unable to sleep at night. Vital signs are BP 138/82, HR 80, Resp. 20 and O2 96% on room air. Patient has some SOB on exertion since starting Diovan. Patient denies any chest pain or SOB at this time.  4.) ROS related to problem: Patient came in for BP check since starting back on Diovan 160 mg by mouth daily. Patient's Lisinopril was discontinue due to side effects of coughing and unable to sleep at night. Vital signs are BP 138/82, HR 80, Resp. 20 and O2 96% on room air. Patient has some SOB on exertion since starting Diovan.  5.) Assessment and plan per MD: Will forward to Dr. Radford Pax for further evaluation.

## 2014-09-23 NOTE — Patient Instructions (Signed)
No changes at this time.

## 2014-10-20 ENCOUNTER — Ambulatory Visit (INDEPENDENT_AMBULATORY_CARE_PROVIDER_SITE_OTHER): Payer: Medicaid Other | Admitting: *Deleted

## 2014-10-20 DIAGNOSIS — I639 Cerebral infarction, unspecified: Secondary | ICD-10-CM | POA: Diagnosis not present

## 2014-10-20 DIAGNOSIS — Z5181 Encounter for therapeutic drug level monitoring: Secondary | ICD-10-CM

## 2014-10-20 DIAGNOSIS — I4891 Unspecified atrial fibrillation: Secondary | ICD-10-CM | POA: Diagnosis not present

## 2014-10-20 DIAGNOSIS — I6350 Cerebral infarction due to unspecified occlusion or stenosis of unspecified cerebral artery: Secondary | ICD-10-CM

## 2014-10-20 LAB — POCT INR: INR: 3.1

## 2014-10-29 ENCOUNTER — Emergency Department (HOSPITAL_COMMUNITY): Payer: Medicaid Other

## 2014-10-29 ENCOUNTER — Telehealth: Payer: Self-pay | Admitting: Cardiology

## 2014-10-29 ENCOUNTER — Emergency Department (HOSPITAL_COMMUNITY)
Admission: EM | Admit: 2014-10-29 | Discharge: 2014-10-29 | Disposition: A | Discharge status: Home / Self Care | Payer: Medicaid Other | Attending: Emergency Medicine | Admitting: Emergency Medicine

## 2014-10-29 ENCOUNTER — Encounter (HOSPITAL_COMMUNITY): Payer: Self-pay | Admitting: Family Medicine

## 2014-10-29 DIAGNOSIS — Z872 Personal history of diseases of the skin and subcutaneous tissue: Secondary | ICD-10-CM | POA: Insufficient documentation

## 2014-10-29 DIAGNOSIS — I483 Typical atrial flutter: Secondary | ICD-10-CM | POA: Diagnosis not present

## 2014-10-29 DIAGNOSIS — R079 Chest pain, unspecified: Secondary | ICD-10-CM

## 2014-10-29 DIAGNOSIS — E119 Type 2 diabetes mellitus without complications: Secondary | ICD-10-CM | POA: Diagnosis not present

## 2014-10-29 DIAGNOSIS — I5032 Chronic diastolic (congestive) heart failure: Secondary | ICD-10-CM | POA: Insufficient documentation

## 2014-10-29 DIAGNOSIS — I10 Essential (primary) hypertension: Secondary | ICD-10-CM | POA: Insufficient documentation

## 2014-10-29 DIAGNOSIS — Z7901 Long term (current) use of anticoagulants: Secondary | ICD-10-CM | POA: Diagnosis not present

## 2014-10-29 DIAGNOSIS — Z8669 Personal history of other diseases of the nervous system and sense organs: Secondary | ICD-10-CM | POA: Insufficient documentation

## 2014-10-29 DIAGNOSIS — I481 Persistent atrial fibrillation: Secondary | ICD-10-CM | POA: Diagnosis not present

## 2014-10-29 DIAGNOSIS — R009 Unspecified abnormalities of heart beat: Secondary | ICD-10-CM | POA: Diagnosis present

## 2014-10-29 DIAGNOSIS — I2510 Atherosclerotic heart disease of native coronary artery without angina pectoris: Secondary | ICD-10-CM | POA: Diagnosis not present

## 2014-10-29 DIAGNOSIS — Z87891 Personal history of nicotine dependence: Secondary | ICD-10-CM | POA: Insufficient documentation

## 2014-10-29 DIAGNOSIS — Z79899 Other long term (current) drug therapy: Secondary | ICD-10-CM | POA: Insufficient documentation

## 2014-10-29 DIAGNOSIS — Z7982 Long term (current) use of aspirin: Secondary | ICD-10-CM | POA: Insufficient documentation

## 2014-10-29 DIAGNOSIS — I480 Paroxysmal atrial fibrillation: Secondary | ICD-10-CM | POA: Diagnosis not present

## 2014-10-29 DIAGNOSIS — I4892 Unspecified atrial flutter: Secondary | ICD-10-CM | POA: Insufficient documentation

## 2014-10-29 HISTORY — DX: Chronic diastolic (congestive) heart failure: I50.32

## 2014-10-29 LAB — PROTIME-INR
INR: 2.62 — ABNORMAL HIGH (ref 0.00–1.49)
Prothrombin Time: 28.2 seconds — ABNORMAL HIGH (ref 11.6–15.2)

## 2014-10-29 LAB — BASIC METABOLIC PANEL
Anion gap: 10 (ref 5–15)
BUN: 17 mg/dL (ref 6–23)
CO2: 22 mmol/L (ref 19–32)
Calcium: 9.5 mg/dL (ref 8.4–10.5)
Chloride: 106 mmol/L (ref 96–112)
Creatinine, Ser: 0.91 mg/dL (ref 0.50–1.35)
GFR calc Af Amer: >90 mL/min (ref >90)
GFR calc non Af Amer: 88 mL/min — ABNORMAL LOW (ref >90)
Glucose, Bld: 121 mg/dL — ABNORMAL HIGH (ref 70–99)
Potassium: 4.6 mmol/L (ref 3.5–5.1)
Sodium: 138 mmol/L (ref 135–145)

## 2014-10-29 LAB — CBC
HCT: 44.4 % (ref 39.0–52.0)
Hemoglobin: 14.9 g/dL (ref 13.0–17.0)
MCH: 30.7 pg (ref 26.0–34.0)
MCHC: 33.6 g/dL (ref 30.0–36.0)
MCV: 91.4 fL (ref 78.0–100.0)
Platelets: 227 10*3/uL (ref 150–400)
RBC: 4.86 MIL/uL (ref 4.22–5.81)
RDW: 14.2 % (ref 11.5–15.5)
WBC: 8.9 10*3/uL (ref 4.0–10.5)

## 2014-10-29 LAB — I-STAT TROPONIN, ED: Troponin i, poc: 0.08 ng/mL (ref 0.00–0.08)

## 2014-10-29 MED ORDER — PROPOFOL 10 MG/ML IV BOLUS
INTRAVENOUS | Status: AC | PRN
  Administered 2014-10-29: 70 mg via INTRAVENOUS

## 2014-10-29 MED ORDER — PROPOFOL 10 MG/ML IV BOLUS
INTRAVENOUS | Status: AC
  Filled 2014-10-29: qty 20

## 2014-10-29 MED ORDER — DILTIAZEM LOAD VIA INFUSION
10.0000 mg | Freq: Once | INTRAVENOUS | Status: DC
  Filled 2014-10-29: qty 10

## 2014-10-29 MED ORDER — DILTIAZEM HCL 100 MG IV SOLR
5.0000 mg/h | INTRAVENOUS | Status: DC
  Administered 2014-10-29: 5 mg/h via INTRAVENOUS
  Filled 2014-10-29: qty 100

## 2014-10-29 MED ORDER — MIDAZOLAM HCL 2 MG/2ML IJ SOLN
INTRAMUSCULAR | Status: AC
  Filled 2014-10-29: qty 2

## 2014-10-29 MED ORDER — DILTIAZEM LOAD VIA INFUSION: 20.0000 mg | Freq: Once | INTRAVENOUS | Status: DC

## 2014-10-29 MED ORDER — DILTIAZEM LOAD VIA INFUSION
20.0000 mg | Freq: Once | INTRAVENOUS | Status: AC
  Administered 2014-10-29: 20 mg via INTRAVENOUS
  Filled 2014-10-29: qty 20

## 2014-10-29 MED ORDER — MIDAZOLAM HCL 2 MG/2ML IJ SOLN
1.0000 mg | Freq: Once | INTRAMUSCULAR | Status: AC
  Administered 2014-10-29: 1 mg via INTRAVENOUS

## 2014-10-29 MED ORDER — DILTIAZEM HCL 100 MG IV SOLR: 5.0000 mg/h | INTRAVENOUS | Status: DC

## 2014-10-29 NOTE — Consult Note (Addendum)
Patient ID: [name] MRN: [id number], DOB/AGE: 64/13/1952   Admit date: 10/29/2014   Primary Physician: Wyatt Haste, MD Primary Cardiologist: T. Turner, MD/ J. Amaka Gluth, MD (EP)  Pt. Profile:  64 y/o male with a h/o afib s/p RFCA and atypical atrial flutter on chronic sotalol and coumadin therapy who presented to the ED today secondary to a 5 day h/o palpitations and chest pain.   Problem List  Past Medical History  Diagnosis Date  . Cerebrovascular accident     a. 03/2008 secondary to cardioembolic event;  b. chronic coumadin.  . Central retinal artery occlusion, left eye     diagnosed in 2008  . Obstructive sleep apnea   . Morbid obesity   . Skin infection     chronic left leg herpetic  . Bell's palsy   . Diabetes mellitus 11/14/2010    TYPE II  . Obesity   . Chronic diastolic CHF (congestive heart failure)     a. 12/2008 Echo: nl EF.  [name] Hypogonadism, male 05/2013    Dr. Estill Dooms, Urology  . Hypertension   . Hyperlipidemia   . Persistent atrial fibrillation     a. s/p afib ablation by [facility] 08/01/08  . Atrial flutter     a. atypical atrial flutter.  [name] CAD (coronary artery disease)     a. 07/2009 Cath: LM nl, LAD nl, LCX nl w/ L->R collats, chronically occluded RCA s/p failed PCI;  b. 07/2012 MV: basal inf mild ischemia.  . Recommendation refused by patient     multiple recommended vaccines refused over the years (flu/pneumovax)  . Hematuria 05/2013    Urology, Dr. Estill Dooms    Past Surgical History  Procedure Laterality Date  . Atrial ablation surgery  08/01/08    CTI and PVI ablation by [facility]  . Colonoscopy      2012 per patient    Allergies  Allergies  Allergen Reactions  . Crestor [Rosuvastatin] Other (See Comments)    Made him feel crazy  . Zetia [Ezetimibe] Other (See Comments)    Made him feel crazy  . Lisinopril Cough   HPI  64 y/o male with a h/o afib and flutter s/p PVI in 2012.  He is on chronic coumadin.  He also has a h/o CAD with CTO of the  RCA and failed PCI in 2011, CVA in 2009, HTN, HL, DM, and obesity.  Following PVI in 2012, he has noted rare instances of palpitations, occurring every six months or so, lasting a few seconds to mins, and resolving spontaneously.  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  He received news on Sunday that his 38 yr old mother in [place] was not doing well.  He drove to [place] on Sunday and on Monday felt more fatigued than usual.  On Tuesday, he began to note palpitations and mild right and left sided chest pressure.  He checked his HR and found it to be in the 130's.  He was pretty sure that he was out of rhythm.  Ss persisted all day Tues and Wed and also became associated with left hand numbness and coolness.  As Ss remained yesterday, he drove home from [place] last night.  He called the office this AM and was advised to present to the ED for evaluation.  Upon arrival, he was tachycardic at 138 with non-specific st/t changes.  He was treated with IV dilt with good rate control into the 80's-90's and complete  resolution of palpitations and chest discomfort.  Initial POC troponin is normal.  He denies pnd, orthopnea, n, v, dizziness, syncope, edema, weight gain, or early satiety.   Home Medications  Prior to Admission medications   Medication Sig Start Date End Date Taking? Authorizing Provider  ACCU-CHEK FASTCLIX LANCETS MISC USE TO TEST BLOOD SUGAR DAILY 08/02/14  Yes Camelia Eng Tysinger, PA-C  aspirin EC 81 MG tablet Take 81 mg by mouth daily.   Yes Historical Provider, MD  atorvastatin (LIPITOR) 80 MG tablet TAKE ONE TABLET BY MOUTH ONCE DAILY 06/18/14  Yes Sueanne Margarita, MD  b complex vitamins capsule Take 1 capsule by mouth daily.   Yes Historical Provider, MD  Cholecalciferol (VITAMIN D3) 10000 UNITS capsule OTC Take 10,000-20,000 units daily   Yes Historical Provider, MD  glucose blood (ACCU-CHEK AVIVA PLUS) test strip 50 each by Other route as needed for other. Use as  instructed 11/23/13  Yes Camelia Eng Tysinger, PA-C  isosorbide mononitrate (IMDUR) 30 MG 24 hr tablet TAKE ONE TABLET BY MOUTH ONCE DAILY 06/18/14  Yes Sueanne Margarita, MD  metFORMIN (GLUCOPHAGE) 1000 MG tablet Take 1 tablet (1,000 mg total) by mouth 2 (two) times daily with a meal. 06/22/14  Yes Camelia Eng Tysinger, PA-C  metoprolol (LOPRESSOR) 50 MG tablet TAKE ONE TABLET BY MOUTH TWICE DAILY 06/18/14  Yes Sueanne Margarita, MD  nitroGLYCERIN (NITROSTAT) 0.4 MG SL tablet Place 0.4 mg under the tongue every 5 (five) minutes as needed for chest pain.   Yes Historical Provider, MD  omega-3 acid ethyl esters (LOVAZA) 1 G capsule Take 2 capsules (2 g total) by mouth 2 (two) times daily. 06/22/14  Yes Camelia Eng Tysinger, PA-C  sotalol (BETAPACE) 160 MG tablet Take 1 tablet (160 mg total) by mouth 2 (two) times daily. 05/21/14  Yes [name], MD  Testosterone (ANDROGEL PUMP) 1.25 GM/ACT (1%) GEL Place onto the skin.     Yes Historical Provider, MD  valACYclovir (VALTREX) 500 MG tablet Take 1 tablet (500 mg total) by mouth daily. 06/22/14  Yes Camelia Eng Tysinger, PA-C  valsartan (DIOVAN) 160 MG tablet Take 1 tablet (160 mg total) by mouth daily. 09/14/14  Yes Sueanne Margarita, MD  warfarin (COUMADIN) 5 MG tablet Take as directed by [facility] Patient taking differently: Take 5-7.5 mg by mouth daily. Monday, Wednesday, Friday, Saturday = 7.5mg      Sunday, Tuesday and Thursday = 5mg  08/30/14  Yes Sueanne Margarita, MD   Family History  Family History  Problem Relation Age of Onset  . Stroke Father   . Heart disease Father   . Heart disease Mother Alive @ 27.   Social History  History   Social History  . Marital Status: Married    Spouse Name: N/A  . Number of Children: N/A  . Years of Education: N/A   Occupational History  . Not on file.   Social History Main Topics  . Smoking status: Former Smoker    Quit date: 07/24/1979  . Smokeless tobacco: Not on file  . Alcohol Use: No     Comment: former  . Drug  Use: No     Comment: former  . Sexual Activity: Not on file   Other Topics Concern  . Not on file   Social History Narrative   Lives in [place], [place]. With his spouse and son. Has remote history of tobacco, but quit 30 years ago. Has heavy history of alcohol consumption but quit 30  years ago. Previously used marijuana and cocaine, but denies use over the past 30 years. 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      Review of Systems General:  No chills, fever, night sweats or weight changes.  Cardiovascular:  +++ chest pain, +++dyspnea on exertion, no edema, orthopnea, +++ palpitations,no paroxysmal nocturnal dyspnea. Dermatological: No rash, lesions/masses Respiratory: No cough, +++ dyspnea Urologic: No hematuria, dysuria Abdominal:   No nausea, vomiting, diarrhea, bright red blood per rectum, melena, or hematemesis Neurologic:  No visual changes, wkns, changes in mental status. All other systems reviewed and are otherwise negative except as noted above.  Physical Exam  Blood pressure 98/65, pulse 71, temperature 97.5 F (36.4 C), temperature source Oral, resp. rate 13, SpO2 97 %.  General: Pleasant, NAD Psych: Normal affect. Neuro: Alert and oriented X 3. Moves all extremities spontaneously. HEENT: Normal  Neck: Supple without bruits or JVD. Lungs:  Resp regular and unlabored, CTA. Heart: Irreg, no s3, s4, or murmurs. Abdomen: Soft, non-tender, non-distended, BS + x 4.  Extremities: No clubbing, cyanosis or edema. DP/PT/Radials 2+ and equal bilaterally.  Labs  Troponin [facility] of Care Test)  Recent Labs  10/29/14 1231  TROPIPOC 0.08   Lab Results  Component Value Date   WBC 8.9 10/29/2014   HGB 14.9 10/29/2014   HCT 44.4 10/29/2014   MCV 91.4 10/29/2014   PLT 227 10/29/2014     Recent Labs Lab 10/29/14 1227  NA 138  K 4.6  CL 106  CO2 22  BUN 17  CREATININE 0.91  CALCIUM 9.5  GLUCOSE 121*   Lab Results  Component Value Date   CHOL 109  03/10/2014   HDL 32.60* 03/10/2014   LDLCALC 51 03/10/2014   TRIG 126.0 03/10/2014   Lab Results  Component Value Date   INR 2.62* 10/29/2014   INR 3.1 10/20/2014   INR 1.8 09/23/2014    Radiology/Studies  Dg Chest Portable 1 View  10/29/2014   CLINICAL DATA:  Diabetes.  Congestive heart failure.  Obesity.  EXAM: PORTABLE CHEST - 1 VIEW  COMPARISON:  PA and lateral chest 02/08/2010.  FINDINGS: Minimal atelectasis is seen the left lung base. Lungs are otherwise clear. Heart size is normal. No pneumothorax or pleural effusion.  IMPRESSION: No acute disease.   Electronically Signed   By: Inge Rise M.D.   On: 10/29/2014 14:06   ECG  1.  Aflutter, 138, non-specific st/t changes, poor r prog (old) 2.  Aflutter, 98, non-specific st/t changes, poor r prog (old) - on IV dilt.  ASSESSMENT AND PLAN  1. Atrial flutter with RVR w/ h/o afib s/p PVI:  Onset of fatigue on Monday 4/4 with development of palpitations and chest pressure beginning on 4/5.  He documented a HR in the 130's on 4/5.  Presented to the ED today 2/2 ongoing Ss.  Initially tachycardic @ 138 but rate now controlled on IV dilt.  He is on sotalol 160 bid and metoprolol 50 bid as an outpt.  INR Rx @ 2.62 today (was 3.1 on 3/30).  Initial troponin normal despite prolonged symptoms.  We will arrange for DCCV here in the ED and subsequent f/u with Dr. Rayann Heman for consideration of repeat ablation.  Provided that DCCV is successful and uneventful, he will be able to be discharged from the ED today.  2.  CAD:  Known h/o CTO of the RCA with mild, basal inf ischemia on MV in 2014. He has had c/p in the setting of above however  his initial troponin is nl and ECG is non-acute.  As above, despite prolonged Ss, initial troponin is nl, which is reassuring.  Cont bb, statin.  Consider discontinuation of asa in setting of chronic coumadin.  3.  HTN:  Stable.  4.  HL:  LDL 51 in 02/2014.  Cont statin.  5.  Chronic Diast CHF:  Euvolemic on exam.  HR/BP stable.  Cont current meds.  Signed, Murray Hodgkins, NP 10/29/2014, 3:06 PM  I have seen, examined the patient, and reviewed the above assessment and plan.  Changes to above are made where necessary.  Pt with recurrent typical appearing atrial flutter.  Risks, benefits, and alternatives to cardioversion are discussed with the patient and spouse who wish to proceed.  Will proceed with cardioversion.  Once in sinus, will discharge to follow-up with me next Thursday.  I do not anticipate that he will require admission.  Co Sign: [name], MD 10/29/2014

## 2014-10-29 NOTE — ED Notes (Signed)
Pt here for irregular HB and chest pain since Monday morning. sts about 140 rate. Pt on metoprolol and coumadin. Hx of ablation and cardioversion.

## 2014-10-29 NOTE — Discharge Instructions (Signed)
Continue your current medications without change.  Appointment with Dr. Rayann Heman next Thursday as above.

## 2014-10-29 NOTE — Telephone Encounter (Signed)
New Message  Pt calling to speak w/ Rn about recent HR increase. Per pt- heart rate last 3-4 days has been around 140-145. Please call back and discuss.

## 2014-10-29 NOTE — ED Notes (Signed)
PLAN FOR CARDIOVERSION. DR. Jeneen Rinks AT BEDSIDE. PT ALLRED PAGED AND COMING TO BEDSIDE.

## 2014-10-29 NOTE — ED Provider Notes (Addendum)
CSN: [id number]     Arrival date & time 10/29/14  1209 History   First MD Initiated Contact with Patient 10/29/14 1232     Chief Complaint  Patient presents with  . Irregular Heart Beat    chest pain      HPI  Patient resides for evaluation of chest pressure and palpitations. Has a history of atrial fibrillation. Status post ablations, and multiple DC cardioversions. His most recent procedure was approximately 3 years ago. He is not on antiarrhythmics and has had no episodes for several years. For last 3 days he's been short of breath with intermittent palpitations. Chest tightness anteriorly today presents here. . No fevers chills cough or other symptoms.  Past Medical History  Diagnosis Date  . Cerebrovascular accident     a. 03/2008 secondary to cardioembolic event;  b. chronic coumadin.  . Central retinal artery occlusion, left eye     diagnosed in 2008  . Obstructive sleep apnea   . Morbid obesity   . Skin infection     chronic left leg herpetic  . Bell's palsy   . Diabetes mellitus 11/14/2010    TYPE II  . Obesity   . Chronic diastolic CHF (congestive heart failure)     a. 12/2008 Echo: nl EF.  [name] Hypogonadism, male 05/2013    Dr. Estill Dooms, Urology  . Hypertension   . Hyperlipidemia   . Persistent atrial fibrillation     a. s/p afib ablation by [facility] 08/01/08  . Atrial flutter     a. atypical atrial flutter.  [name] CAD (coronary artery disease)     a. 07/2009 Cath: LM nl, LAD nl, LCX nl w/ L->R collats, chronically occluded RCA s/p failed PCI;  b. 07/2012 MV: basal inf mild ischemia.  . Recommendation refused by patient     multiple recommended vaccines refused over the years (flu/pneumovax)  . Hematuria 05/2013    Urology, Dr. Estill Dooms   Past Surgical History  Procedure Laterality Date  . Atrial ablation surgery  08/01/08    CTI and PVI ablation by [facility]  . Colonoscopy      2012 per patient   Family History  Problem Relation Age of Onset  . Stroke Father   . Heart disease Father    . Heart disease Mother    History  Substance Use Topics  . Smoking status: Former Smoker    Quit date: 07/24/1979  . Smokeless tobacco: Not on file  . Alcohol Use: No     Comment: former    Review of Systems  Constitutional: Negative for fever, chills, diaphoresis, appetite change and fatigue.  HENT: Negative for mouth sores, sore throat and trouble swallowing.   Eyes: Negative for visual disturbance.  Respiratory: Positive for chest tightness and shortness of breath. Negative for cough and wheezing.   Cardiovascular: Positive for palpitations. Negative for chest pain.  Gastrointestinal: Negative for nausea, vomiting, abdominal pain, diarrhea and abdominal distention.  Endocrine: Negative for polydipsia, polyphagia and polyuria.  Genitourinary: Negative for dysuria, frequency and hematuria.  Musculoskeletal: Negative for gait problem.  Skin: Negative for color change, pallor and rash.  Neurological: Negative for dizziness, syncope, light-headedness and headaches.  Hematological: Does not bruise/bleed easily.  Psychiatric/Behavioral: Negative for behavioral problems and confusion.      Allergies  Crestor; Zetia; and Lisinopril  Home Medications   Prior to Admission medications   Medication Sig Start Date End Date Taking? Authorizing Provider  ACCU-CHEK FASTCLIX LANCETS MISC USE TO TEST BLOOD SUGAR DAILY  08/02/14  Yes Camelia Eng Tysinger, PA-C  aspirin EC 81 MG tablet Take 81 mg by mouth daily.   Yes Historical Provider, MD  atorvastatin (LIPITOR) 80 MG tablet TAKE ONE TABLET BY MOUTH ONCE DAILY 06/18/14  Yes Sueanne Margarita, MD  b complex vitamins capsule Take 1 capsule by mouth daily.   Yes Historical Provider, MD  Cholecalciferol (VITAMIN D3) 10000 UNITS capsule OTC Take 10,000-20,000 units daily   Yes Historical Provider, MD  glucose blood (ACCU-CHEK AVIVA PLUS) test strip 50 each by Other route as needed for other. Use as instructed 11/23/13  Yes Camelia Eng Tysinger, PA-C   isosorbide mononitrate (IMDUR) 30 MG 24 hr tablet TAKE ONE TABLET BY MOUTH ONCE DAILY 06/18/14  Yes Sueanne Margarita, MD  metFORMIN (GLUCOPHAGE) 1000 MG tablet Take 1 tablet (1,000 mg total) by mouth 2 (two) times daily with a meal. 06/22/14  Yes Camelia Eng Tysinger, PA-C  metoprolol (LOPRESSOR) 50 MG tablet TAKE ONE TABLET BY MOUTH TWICE DAILY 06/18/14  Yes Sueanne Margarita, MD  nitroGLYCERIN (NITROSTAT) 0.4 MG SL tablet Place 0.4 mg under the tongue every 5 (five) minutes as needed for chest pain.   Yes Historical Provider, MD  omega-3 acid ethyl esters (LOVAZA) 1 G capsule Take 2 capsules (2 g total) by mouth 2 (two) times daily. 06/22/14  Yes Camelia Eng Tysinger, PA-C  sotalol (BETAPACE) 160 MG tablet Take 1 tablet (160 mg total) by mouth 2 (two) times daily. 05/21/14  Yes [name], MD  Testosterone (ANDROGEL PUMP) 1.25 GM/ACT (1%) GEL Place onto the skin.     Yes Historical Provider, MD  valACYclovir (VALTREX) 500 MG tablet Take 1 tablet (500 mg total) by mouth daily. 06/22/14  Yes Camelia Eng Tysinger, PA-C  valsartan (DIOVAN) 160 MG tablet Take 1 tablet (160 mg total) by mouth daily. 09/14/14  Yes Sueanne Margarita, MD  warfarin (COUMADIN) 5 MG tablet Take as directed by [facility] Patient taking differently: Take 5-7.5 mg by mouth daily. Monday, Wednesday, Friday, Saturday = 7.5mg      Sunday, Tuesday and Thursday = 5mg  08/30/14  Yes Sueanne Margarita, MD   BP 124/70 mmHg  Pulse 61  Temp(Src) 97.6 F (36.4 C) (Oral)  Resp 14  SpO2 95% Physical Exam  Constitutional: He is oriented to person, place, and time. He appears well-developed and well-nourished. No distress.  Awake alert. Interacting jovial. Full white beard. Looks like [place]:  Head: Normocephalic.  Eyes: Conjunctivae are normal. Pupils are equal, round, and reactive to light. No scleral icterus.  Neck: Normal range of motion. Neck supple. No thyromegaly present.  Cardiovascular: Normal rate and regular rhythm.  Exam reveals no  gallop and no friction rub.   No murmur heard. Tachycardic. Regular. A flutter with 2;1 block with 150 on the monitor.  Pulmonary/Chest: Effort normal and breath sounds normal. No respiratory distress. He has no wheezes. He has no rales.  Abdominal: Soft. Bowel sounds are normal. He exhibits no distension. There is no tenderness. There is no rebound.  Musculoskeletal: Normal range of motion.  Neurological: He is alert and oriented to person, place, and time.  Skin: Skin is warm and dry. No rash noted.  Psychiatric: He has a normal mood and affect. His behavior is normal.    ED Course  Procedures (including critical care time) Labs Review Labs Reviewed  BASIC METABOLIC PANEL - Abnormal; Notable for the following:    Glucose, Bld 121 (*)    GFR  calc non Af Amer 88 (*)    All other components within normal limits  PROTIME-INR - Abnormal; Notable for the following:    Prothrombin Time 28.2 (*)    INR 2.62 (*)    All other components within normal limits  CBC  I-STAT TROPOININ, ED    Imaging Review Dg Chest Portable 1 View  10/29/2014   CLINICAL DATA:  Diabetes.  Congestive heart failure.  Obesity.  EXAM: PORTABLE CHEST - 1 VIEW  COMPARISON:  PA and lateral chest 02/08/2010.  FINDINGS: Minimal atelectasis is seen the left lung base. Lungs are otherwise clear. Heart size is normal. No pneumothorax or pleural effusion.  IMPRESSION: No acute disease.   Electronically Signed   By: Inge Rise M.D.   On: 10/29/2014 14:06     EKG Interpretation   Date/Time:  Friday October 29 2014 16:07:10 EDT Ventricular Rate:  61 PR Interval:  172 QRS Duration: 87 QT Interval:  454 QTC Calculation: 457 R Axis:   -4 Text Interpretation:  Sinus rhythm Probable anterior infarct, age  indeterminate Confirmed by Jeneen Rinks  MD, [place] ([id number]) on 10/29/2014 4:14:55 PM      CRITICAL CARE Performed by: Tanna Furry JOSEPH   Total critical care time: 60 minutes Initiation of discontinuation of Cardizem  bolus and drip. Procedural sedation, see below.  Critical care time was exclusive of separately billable procedures and treating other patients.  Critical care was necessary to treat or prevent imminent or life-threatening deterioration.  Critical care was time spent personally by me on the following activities: development of treatment plan with patient and/or surrogate as well as nursing, discussions with consultants, evaluation of patient's response to treatment, examination of patient, obtaining history from patient or surrogate, ordering and performing treatments and interventions, ordering and review of laboratory studies, ordering and review of radiographic studies, pulse oximetry and re-evaluation of patient's condition.   MDM   Final diagnoses:  Chest pain    Procedural sedation Performed by: Lolita Patella Consent: Verbal consent obtained. Risks and benefits: risks, benefits and alternatives were discussed Required items: required blood products, implants, devices, and special equipment available Patient identity confirmed: arm band and provided demographic data Time out: Immediately prior to procedure a "time out" was called to verify the correct patient, procedure, equipment, support staff and site/side marked as required.  Sedation type: moderate (conscious) sedation NPO time confirmed and considedered  Sedatives: PROPOFOL  Physician Time at Bedside: 30 minutes Start:  15:450 Stop:  16 15  Vitals: Vital signs were monitored during sedation. Cardiac Monitor, pulse oximeter Patient tolerance: Patient tolerated the procedure well with no immediate complications. Comments: Pt with uneventful recovered. Returned to pre-procedural sedation baseline   The patient underwent DC cardioversion. Immediately converts to sinus rhythm. Awake and alert following. Okay for discharge per Dr. Rayann Heman. Will follow-up in the office on Thursday.       Tanna Furry, MD 10/29/14  1622  Tanna Furry, MD 10/29/14 [phone]

## 2014-10-29 NOTE — CV Procedure (Signed)
EP procedure Note   Pre procedure Diagnosis:  Typical atrial flutter Post procedure Diagnosis:  Same  Procedures:  Electrical cardioversion  Description:  Informed, written consent was obtained for cardioversion.  Adequate IV acces and airway support were assured.  The patient was adequately sedated with intravenous propofol as outlined in the ED physician/nurse report.  The patient presented today in atrial flutter.  He was successfully cardioverted to sinus rhythm with a single synchronized biphasic 200J shock delivered with cardioversion electrodes placed in the anterior/posterior configuration.  He remains in sinus rhythm thereafter. EBL 58ml. There were no early apparent complications.  Conclusions:  1.  Successful cardioversion of atrial flutter to sinus rhythm 2.  No early apparent complications.  OK to discharge to home once awake. Will follow-up with me next week.  Verneta Hamidi,MD 4:18 PM 10/29/2014

## 2014-10-29 NOTE — Telephone Encounter (Signed)
Spoke with wife  He has been out of town for a few days and came home and his heart is out of rhythm.  HR is 145 BP 100/89.  He is having tightness across his test and had it while away with numbness in arm.  I have discussed with Truitt Merle, NP who recommended for him to go to the ER to have a cardioversion.  He ha not eaten today and I asked that he not eat.  Spoke with Wannetta Sender and she is aware he is coming

## 2014-10-29 NOTE — Sedation Documentation (Signed)
Dr. Rayann Heman cardioverting at 200 J. SR seen on monitor.

## 2014-11-04 ENCOUNTER — Ambulatory Visit (INDEPENDENT_AMBULATORY_CARE_PROVIDER_SITE_OTHER): Payer: Medicaid Other | Admitting: Internal Medicine

## 2014-11-04 ENCOUNTER — Encounter: Payer: Self-pay | Admitting: Internal Medicine

## 2014-11-04 VITALS — BP 132/74 | HR 66 | Ht 70.0 in | Wt 280.0 lb

## 2014-11-04 DIAGNOSIS — R079 Chest pain, unspecified: Secondary | ICD-10-CM

## 2014-11-04 DIAGNOSIS — I2510 Atherosclerotic heart disease of native coronary artery without angina pectoris: Secondary | ICD-10-CM | POA: Diagnosis not present

## 2014-11-04 DIAGNOSIS — I483 Typical atrial flutter: Secondary | ICD-10-CM | POA: Diagnosis not present

## 2014-11-04 DIAGNOSIS — E669 Obesity, unspecified: Secondary | ICD-10-CM

## 2014-11-04 DIAGNOSIS — I480 Paroxysmal atrial fibrillation: Secondary | ICD-10-CM

## 2014-11-04 DIAGNOSIS — I2584 Coronary atherosclerosis due to calcified coronary lesion: Secondary | ICD-10-CM

## 2014-11-04 MED ORDER — FUROSEMIDE 40 MG PO TABS: 40.0000 mg | ORAL_TABLET | Freq: Every day | ORAL | Status: DC

## 2014-11-04 NOTE — Patient Instructions (Addendum)
Your physician has requested that you have an echocardiogram. Echocardiography is a painless test that uses sound waves to create images of your heart. It provides your doctor with information about the size and shape of your heart and how well your heart's chambers and valves are working. This procedure takes approximately one hour. There are no restrictions for this procedure.  Your physician has requested that you have a lexiscan myoview. For further information please visit [email or web address]. Please follow instruction sheet, as given.--will need to hold Metoprolol for 36 hours prior to test and also hold Sotalol 12  hours prior to test  Your physician has recommended you make the following change in your medication:  1) Take furosemide 40 mg daily for 3 days only   Your physician has recommended that you have an ablation. Catheter ablation is a medical procedure used to treat some cardiac arrhythmias (irregular heartbeats). During catheter ablation, a long, thin, flexible tube is put into a blood vessel in your groin (upper thigh), or neck. This tube is called an ablation catheter. It is then guided to your heart through the blood vessel. Radio frequency waves destroy small areas of heart tissue where abnormal heartbeats may cause an arrhythmia to start. Please see the instruction sheet given to you today.---11/30/14 at10:30am  TEE prior with Dr Johnsie Cancel  Be at the hospital at [place]   Will need weekly INR's

## 2014-11-05 NOTE — Progress Notes (Signed)
Electrophysiology Office Note   Date:  11/05/2014   ID:  [name], DOB January 16, 1951, MRN [id number]  PCP:  Wyatt Haste, MD  Cardiologist:  Dr Radford Pax Primary Electrophysiologist: [name], MD    Chief Complaint  Patient presents with  . Atrial Fibrillation    Paroxysmal     History of Present Illness: [name] is a 64 y.o. male who presents today for electrophysiology evaluation.   Since his recent hospital discharge, he has done "OK".  He has had no further atrial arrhythmias but has noticed significant SOB at rest and with minimal exertion.  He denies CP.  Today, he denies symptoms of palpitations, chest pain, orthopnea, PND, lower extremity edema, claudication, dizziness, presyncope, syncope, bleeding, or neurologic sequela. The patient is tolerating medications without difficulties and is otherwise without complaint today.    Past Medical History  Diagnosis Date  . Cerebrovascular accident     a. 03/2008 secondary to cardioembolic event;  b. chronic coumadin.  . Central retinal artery occlusion, left eye     diagnosed in 2008  . Obstructive sleep apnea   . Morbid obesity   . Skin infection     chronic left leg herpetic  . Bell's palsy   . Diabetes mellitus 11/14/2010    TYPE II  . Obesity   . Chronic diastolic CHF (congestive heart failure)     a. 12/2008 Echo: nl EF.  [name] Hypogonadism, male 05/2013    Dr. Estill Dooms, Urology  . Hypertension   . Hyperlipidemia   . Persistent atrial fibrillation     a. s/p afib ablation by [facility] 08/01/08  . Atrial flutter     a. atypical atrial flutter.  [name] CAD (coronary artery disease)     a. 07/2009 Cath: LM nl, LAD nl, LCX nl w/ L->R collats, chronically occluded RCA s/p failed PCI;  b. 07/2012 MV: basal inf mild ischemia.  . Recommendation refused by patient     multiple recommended vaccines refused over the years (flu/pneumovax)  . Hematuria 05/2013    Urology, Dr. Estill Dooms  . Atrial fibrillation    Past Surgical  History  Procedure Laterality Date  . Atrial ablation surgery  08/01/08    CTI and PVI ablation by [facility]  . Colonoscopy      2012 per patient     Current Outpatient Prescriptions  Medication Sig Dispense Refill  . ACCU-CHEK FASTCLIX LANCETS MISC USE TO TEST BLOOD SUGAR DAILY 102 each 0  . atorvastatin (LIPITOR) 80 MG tablet TAKE ONE TABLET BY MOUTH ONCE DAILY 30 tablet 6  . glucose blood (ACCU-CHEK AVIVA PLUS) test strip 50 each by Other route as needed for other. Use as instructed 50 each 10  . isosorbide mononitrate (IMDUR) 30 MG 24 hr tablet TAKE ONE TABLET BY MOUTH ONCE DAILY 30 tablet 6  . metFORMIN (GLUCOPHAGE) 1000 MG tablet Take 1 tablet (1,000 mg total) by mouth 2 (two) times daily with a meal. 180 tablet 1  . metoprolol (LOPRESSOR) 50 MG tablet TAKE ONE TABLET BY MOUTH TWICE DAILY 60 tablet 6  . omega-3 acid ethyl esters (LOVAZA) 1 G capsule Take 2 capsules (2 g total) by mouth 2 (two) times daily. 120 capsule 2  . sotalol (BETAPACE) 160 MG tablet Take 1 tablet (160 mg total) by mouth 2 (two) times daily. 60 tablet 11  . Testosterone (ANDROGEL PUMP) 1.25 GM/ACT (1%) GEL Place 1 application onto the skin daily.     . valACYclovir (VALTREX) 500 MG tablet Take  1 tablet (500 mg total) by mouth daily. 90 tablet 3  . valsartan (DIOVAN) 160 MG tablet Take 1 tablet (160 mg total) by mouth daily. 30 tablet 6  . warfarin (COUMADIN) 5 MG tablet Take as directed by [facility] (Patient taking differently: Take 5-7.5 mg by mouth daily. Monday, Wednesday, Friday, Saturday = 7.5mg      Sunday, Tuesday and Thursday = 5mg ) 40 tablet 3  . b complex vitamins capsule Take 1 capsule by mouth daily.    . Cholecalciferol (VITAMIN D3) 10000 UNITS capsule OTC Take 10,000-20,000 units by mouth daily    . furosemide (LASIX) 40 MG tablet Take 1 tablet (40 mg total) by mouth daily. 30 tablet 3  . nitroGLYCERIN (NITROSTAT) 0.4 MG SL tablet Place 0.4 mg under the tongue every 5 (five) minutes as needed for chest  pain (MAX 3 TABLETS).      No current facility-administered medications for this visit.    Allergies:   Crestor; Zetia; and Lisinopril   Social History:  The patient  reports that he quit smoking about 35 years ago. He does not have any smokeless tobacco history on file. He reports that he does not drink alcohol or use illicit drugs.   Family History:  The patient's  family history includes Heart disease in his father and mother; Stroke in his father.    ROS:  Please see the history of present illness.   All other systems are reviewed and negative.    PHYSICAL EXAM: VS:  BP 132/74 mmHg  Pulse 66  Ht 5\' 10"  (1.778 m)  Wt 280 lb (127.007 kg)  BMI 40.18 kg/m2 , BMI Body mass index is 40.18 kg/(m^2). GEN: Well nourished, well developed, in no acute distress HEENT: normal Neck: + JVD, carotid bruits, or masses Cardiac: RRR; no murmurs, rubs, or gallops,trace edema  Respiratory:  Few basilar rales, normal work of breathing GI: soft, nontender, nondistended, + BS MS: no deformity or atrophy Skin: warm and dry  Neuro:  Strength and sensation are intact, stable L facial droop Psych: euthymic mood, full affect  EKG:  EKG is ordered today. The ekg ordered today shows sinus rhythm   Recent Labs: 03/10/2014: ALT 26 10/29/2014: BUN 17; Creatinine 0.91; Hemoglobin 14.9; Platelets 227; Potassium 4.6; Sodium 138    Lipid Panel     Component Value Date/Time   CHOL 109 03/10/2014 1151   TRIG 126.0 03/10/2014 1151   HDL 32.60* 03/10/2014 1151   CHOLHDL 3 03/10/2014 1151   VLDL 25.2 03/10/2014 1151   LDLCALC 51 03/10/2014 1151   LDLDIRECT 87.7 05/08/2013 0837     Wt Readings from Last 3 Encounters:  11/04/14 280 lb (127.007 kg)  09/23/14 280 lb (127.007 kg)  06/22/14 269 lb (122.018 kg)      Other studies Reviewed: Additional studies/ records that were reviewed today include: recent hospital records, prior echo  Review of the above records today demonstrates: last echo  2010   ASSESSMENT AND PLAN:  1.  Paroxysmal atrial fibrillation and atrial flutter The patient has had recurrent arrhythmias, most recently typical appearing atrial flutter despite medical therapy with tikosyn.  His chads2vasc score is at least 5.  He is appropriately anticoagulated. Therapeutic strategies for afib/ atrial flutter including medicine and repeat catheter ablation were discussed in detail with the patient today. Risk, benefits, and alternatives to EP study and radiofrequency ablation for afib were also discussed in detail today. These risks include but are not limited to stroke, bleeding, vascular damage,  tamponade, perforation, damage to the esophagus, lungs, and other structures, pulmonary vein stenosis, worsening renal function, and death. The patient understands these risk and wishes to proceed.  We Travis therefore proceed with catheter ablation at the next available time.  I Travis obtain an echo prior to ablation to evaluate LA size and EF.  He Travis also have preprocedure TEE to evaluate for significant valvular dysfunction or thrombus.  Compliance with CPAP is encouraged.  Lifestyle modification including regular exercise and weight reduction strategies were also discussed.  Travis need weekly INRs prior to ablation.  2. Worsening SOB/ CAD He has known CAD.  Given worsening SOB recently, Travis obtain myoview prior to repeat catheter ablation.  If low risk, Travis proceed with ablation. Lasix 40mg  daily Travis be given x 3 days starting today  3. HTN Stable No change required today   Current medicines are reviewed at length with the patient today.   The patient does not have concerns regarding his medicines.  The following changes were made today:  none  Labs/ tests ordered today include:  Orders Placed This Encounter  Procedures  . Myocardial Perfusion Imaging  . EKG 12-Lead  . 2D Echocardiogram without contrast    Signed, [name], MD  11/05/2014 11:28 AM     [place] [address] [phone] (office) [phone] (fax)

## 2014-11-10 ENCOUNTER — Ambulatory Visit (INDEPENDENT_AMBULATORY_CARE_PROVIDER_SITE_OTHER): Payer: Medicaid Other | Admitting: *Deleted

## 2014-11-10 DIAGNOSIS — I4891 Unspecified atrial fibrillation: Secondary | ICD-10-CM | POA: Diagnosis not present

## 2014-11-10 DIAGNOSIS — I6350 Cerebral infarction due to unspecified occlusion or stenosis of unspecified cerebral artery: Secondary | ICD-10-CM

## 2014-11-10 DIAGNOSIS — Z5181 Encounter for therapeutic drug level monitoring: Secondary | ICD-10-CM

## 2014-11-10 DIAGNOSIS — I639 Cerebral infarction, unspecified: Secondary | ICD-10-CM | POA: Diagnosis not present

## 2014-11-10 DIAGNOSIS — I480 Paroxysmal atrial fibrillation: Secondary | ICD-10-CM

## 2014-11-10 LAB — POCT INR: INR: 3.2

## 2014-11-18 ENCOUNTER — Ambulatory Visit (HOSPITAL_COMMUNITY): Payer: Medicaid Other | Attending: Internal Medicine

## 2014-11-18 ENCOUNTER — Ambulatory Visit (INDEPENDENT_AMBULATORY_CARE_PROVIDER_SITE_OTHER): Payer: Medicaid Other | Admitting: *Deleted

## 2014-11-18 ENCOUNTER — Ambulatory Visit (HOSPITAL_BASED_OUTPATIENT_CLINIC_OR_DEPARTMENT_OTHER): Payer: Medicaid Other | Admitting: Radiology

## 2014-11-18 DIAGNOSIS — I340 Nonrheumatic mitral (valve) insufficiency: Secondary | ICD-10-CM | POA: Diagnosis not present

## 2014-11-18 DIAGNOSIS — Z5181 Encounter for therapeutic drug level monitoring: Secondary | ICD-10-CM | POA: Diagnosis not present

## 2014-11-18 DIAGNOSIS — I2584 Coronary atherosclerosis due to calcified coronary lesion: Secondary | ICD-10-CM | POA: Diagnosis not present

## 2014-11-18 DIAGNOSIS — I480 Paroxysmal atrial fibrillation: Secondary | ICD-10-CM

## 2014-11-18 DIAGNOSIS — I4891 Unspecified atrial fibrillation: Secondary | ICD-10-CM

## 2014-11-18 DIAGNOSIS — I639 Cerebral infarction, unspecified: Secondary | ICD-10-CM

## 2014-11-18 DIAGNOSIS — I6350 Cerebral infarction due to unspecified occlusion or stenosis of unspecified cerebral artery: Secondary | ICD-10-CM

## 2014-11-18 DIAGNOSIS — R079 Chest pain, unspecified: Secondary | ICD-10-CM | POA: Diagnosis not present

## 2014-11-18 DIAGNOSIS — I2510 Atherosclerotic heart disease of native coronary artery without angina pectoris: Secondary | ICD-10-CM | POA: Diagnosis not present

## 2014-11-18 DIAGNOSIS — I071 Rheumatic tricuspid insufficiency: Secondary | ICD-10-CM | POA: Diagnosis not present

## 2014-11-18 LAB — POCT INR: INR: 2.6

## 2014-11-18 MED ORDER — TECHNETIUM TC 99M SESTAMIBI GENERIC - CARDIOLITE
33.0000 | Freq: Once | INTRAVENOUS | Status: AC | PRN
  Administered 2014-11-18: 33 via INTRAVENOUS

## 2014-11-18 MED ORDER — PERFLUTREN LIPID MICROSPHERE
1.0000 mL | Freq: Once | INTRAVENOUS | Status: AC
  Administered 2014-11-18: 1 mL via INTRAVENOUS

## 2014-11-18 NOTE — Progress Notes (Addendum)
2D Echo with Definity completed. 11/18/2014

## 2014-11-18 NOTE — Progress Notes (Signed)
[facility] [address] [place], [place] [address] [phone]    Cardiology Nuclear Med Study  [name] is a 64 y.o. male     MRN : [id number]     DOB: 08/11/1950  Procedure Date: 11/18/2014  Nuclear Med Background Indication for Stress Test:  Evaluation for Ischemia, and 11-30-2014 Atrial Fibrillation Ablation by [name], MD History:  CAD, 07/25/2012 MPI: EF: 61% mild ischemia AFIB/AFLUTTER , Cardioversion Cardiac Risk Factors: Hypertension and NIDDM  Symptoms:  DOE and SOB   Nuclear Pre-Procedure Caffeine/Decaff Intake:  None> 12 hrs NPO After: 6:00pm   Lungs:  clear O2 Sat: 96% on room air. IV 0.9% NS with Angio Cath:  22g  IV Site: R Hand x 1, tolerated well IV Started by:  Irven Baltimore, RN  Chest Size (in):  54 Cup Size: n/a  Height: 5\' 10"  (1.778 m)  Weight:  286 lb (129.729 kg)  BMI:  Body mass index is 41.04 kg/(m^2). Tech Comments:  Patient held Lopressor x 36hrs. and Sotalol  X 12 hrs.Irven Baltimore, RN.    Nuclear Med Study 1 or 2 day study: 2 day  Stress Test Type:  Stress  Reading MD: N/A  Order Authorizing Provider:  [name], MD  Resting Radionuclide: Technetium 65m Sestamibi  Resting Radionuclide Dose: 33.0 mCi on 11/19/14   Stress Radionuclide:  Technetium 74m Sestamibi  Stress Radionuclide Dose: 33.0 mCi on 11/18/14           Stress Protocol Rest HR: 76 Stress HR: 142  Rest BP: 162/105 Stress BP: 204/85  Exercise Time (min): 5:16 METS: 6.60   Predicted Max HR: 156 bpm % Max HR: 91.03 bpm Rate Pressure Product: 28968   Dose of Adenosine (mg):  n/a Dose of Lexiscan: n/a mg  Dose of Atropine (mg): n/a Dose of Dobutamine: n/a mcg/kg/min (at max HR)  Stress Test Technologist: Perrin Maltese, EMT-P  Nuclear Technologist:  Earl Many, CNMT     Rest Procedure:  Myocardial perfusion imaging was performed at rest 45 minutes following the intravenous administration of Technetium 52m Sestamibi. Rest ECG: SR  82 bpm    Stress Procedure:  The patient exercised on the treadmill utilizing the Bruce Protocol for 5:16 minutes. The patient stopped due to sob, fatigue, and denied any chest pain.  Technetium 85m Sestamibi was injected at peak exercise and myocardial perfusion imaging was performed after a brief delay. Stress ECG:  1 mm ST depression in leads I, II, V6  At 3 and 4 min recovery .  Changes did not normalize later in recovery period    QPS Raw Data Images:  Soft tissue (diaphragm, subcutaneous fat) surround heart.   Stress Images:Medium sized defect in the inferolateral wall (base, mid), inferior wall (base, mid).  Otherwise normal perfusion.   Rest Images:  Partial improvement in the mid inferolateral wall.   Subtraction (SDS):  Transient Ischemic Dilatation (Normal <1.22):  1.03 Lung/Heart Ratio (Normal <0.45):  0.29  Quantitative Gated Spect Images QGS EDV:  117 ml QGS ESV:  47 ml  Impression Exercise Capacity:  Fair exercise capacity. BP Response:  Normal blood pressure response. Clinical Symptoms:SOB  No CP ECG Impression:  Significant ST abnormalities consistent with ischemia. Comparison with Prior Nuclear Study:  Previous report with mild inferior ischemia.    Overall Impression: Inferior/inferolateral defect consistent with probable soft tissue attenuation with possible mild ischemia in the mid inferolateral wall.  Overall low risk scan.    LV Ejection  Fraction: 60%.  LV Wall Motion:  NL LV Function; NL Wall Motion

## 2014-11-19 ENCOUNTER — Ambulatory Visit (HOSPITAL_COMMUNITY): Payer: Medicaid Other | Attending: Cardiovascular Disease

## 2014-11-19 MED ORDER — TECHNETIUM TC 99M SESTAMIBI GENERIC - CARDIOLITE
30.0000 | Freq: Once | INTRAVENOUS | Status: AC | PRN
  Administered 2014-11-19: 30 via INTRAVENOUS

## 2014-11-24 LAB — MYOCARDIAL PERFUSION IMAGING
Estimated workload: 5.7 METS
Peak HR: 137 {beats}/min
Percent of predicted max HR: 87 %
Stage 1 DBP: 105 mmHg
Stage 1 Grade: 0 %
Stage 1 HR: 84 {beats}/min
Stage 1 SBP: 162 mmHg
Stage 1 Speed: 0 mph
Stage 2 DBP: 95 mmHg
Stage 2 Grade: 0 %
Stage 2 HR: 80 {beats}/min
Stage 2 SBP: 171 mmHg
Stage 2 Speed: 0 mph
Stage 3 Grade: 0 %
Stage 3 HR: 80 {beats}/min
Stage 3 Speed: 0 mph
Stage 4 Grade: 10 %
Stage 4 HR: 120 {beats}/min
Stage 4 Speed: 1.7 mph
Stage 5 DBP: 85 mmHg
Stage 5 Grade: 12 %
Stage 5 HR: 142 {beats}/min
Stage 5 SBP: 204 mmHg
Stage 5 Speed: 2.5 mph
Stage 6 Grade: 0 %
Stage 6 HR: 137 {beats}/min
Stage 6 Speed: 2.5 mph
Stage 7 DBP: 84 mmHg
Stage 7 Grade: 0 %
Stage 7 HR: 123 {beats}/min
Stage 7 SBP: 203 mmHg
Stage 7 Speed: 0 mph
Stage 8 DBP: 83 mmHg
Stage 8 Grade: 0 %
Stage 8 HR: 85 {beats}/min
Stage 8 SBP: 180 mmHg
Stage 8 Speed: 0 mph

## 2014-11-25 ENCOUNTER — Ambulatory Visit (INDEPENDENT_AMBULATORY_CARE_PROVIDER_SITE_OTHER): Payer: Medicaid Other

## 2014-11-25 DIAGNOSIS — I4891 Unspecified atrial fibrillation: Secondary | ICD-10-CM

## 2014-11-25 DIAGNOSIS — Z5181 Encounter for therapeutic drug level monitoring: Secondary | ICD-10-CM

## 2014-11-25 DIAGNOSIS — I639 Cerebral infarction, unspecified: Secondary | ICD-10-CM

## 2014-11-25 DIAGNOSIS — I6350 Cerebral infarction due to unspecified occlusion or stenosis of unspecified cerebral artery: Secondary | ICD-10-CM

## 2014-11-25 LAB — POCT INR: INR: 2

## 2014-11-29 MED ORDER — SODIUM CHLORIDE 0.9 % IV SOLN
INTRAVENOUS | Status: DC
  Administered 2014-11-30: 100 mL/h via INTRAVENOUS

## 2014-11-29 MED ORDER — SODIUM CHLORIDE 0.9 % IV SOLN: INTRAVENOUS | Status: DC

## 2014-11-30 ENCOUNTER — Encounter (HOSPITAL_COMMUNITY)
Admission: RE | Disposition: A | Discharge status: Home / Self Care | Payer: Medicaid Other | Source: Ambulatory Visit | Attending: Internal Medicine

## 2014-11-30 ENCOUNTER — Encounter (HOSPITAL_COMMUNITY): Payer: Self-pay | Admitting: Anesthesiology

## 2014-11-30 ENCOUNTER — Encounter (HOSPITAL_COMMUNITY)
Admission: RE | Disposition: A | Discharge status: Home / Self Care | Payer: Self-pay | Source: Ambulatory Visit | Attending: Internal Medicine

## 2014-11-30 ENCOUNTER — Ambulatory Visit (HOSPITAL_COMMUNITY): Payer: Medicaid Other

## 2014-11-30 ENCOUNTER — Ambulatory Visit (HOSPITAL_COMMUNITY): Payer: Medicaid Other | Admitting: Anesthesiology

## 2014-11-30 ENCOUNTER — Ambulatory Visit (HOSPITAL_COMMUNITY)
Admission: RE | Admit: 2014-11-30 | Discharge: 2014-12-01 | Disposition: A | Discharge status: Home / Self Care | Payer: Medicaid Other | Source: Ambulatory Visit | Attending: Internal Medicine | Admitting: Internal Medicine

## 2014-11-30 DIAGNOSIS — Z7901 Long term (current) use of anticoagulants: Secondary | ICD-10-CM | POA: Insufficient documentation

## 2014-11-30 DIAGNOSIS — G4733 Obstructive sleep apnea (adult) (pediatric): Secondary | ICD-10-CM | POA: Diagnosis not present

## 2014-11-30 DIAGNOSIS — I2510 Atherosclerotic heart disease of native coronary artery without angina pectoris: Secondary | ICD-10-CM | POA: Diagnosis not present

## 2014-11-30 DIAGNOSIS — I509 Heart failure, unspecified: Secondary | ICD-10-CM | POA: Insufficient documentation

## 2014-11-30 DIAGNOSIS — I5032 Chronic diastolic (congestive) heart failure: Secondary | ICD-10-CM | POA: Insufficient documentation

## 2014-11-30 DIAGNOSIS — Z6841 Body Mass Index (BMI) 40.0 and over, adult: Secondary | ICD-10-CM | POA: Diagnosis not present

## 2014-11-30 DIAGNOSIS — Z87891 Personal history of nicotine dependence: Secondary | ICD-10-CM | POA: Diagnosis not present

## 2014-11-30 DIAGNOSIS — I4891 Unspecified atrial fibrillation: Secondary | ICD-10-CM

## 2014-11-30 DIAGNOSIS — I4819 Other persistent atrial fibrillation: Secondary | ICD-10-CM | POA: Diagnosis present

## 2014-11-30 DIAGNOSIS — I10 Essential (primary) hypertension: Secondary | ICD-10-CM | POA: Insufficient documentation

## 2014-11-30 DIAGNOSIS — I480 Paroxysmal atrial fibrillation: Secondary | ICD-10-CM | POA: Diagnosis not present

## 2014-11-30 DIAGNOSIS — E119 Type 2 diabetes mellitus without complications: Secondary | ICD-10-CM | POA: Diagnosis not present

## 2014-11-30 DIAGNOSIS — I483 Typical atrial flutter: Secondary | ICD-10-CM | POA: Diagnosis not present

## 2014-11-30 DIAGNOSIS — E785 Hyperlipidemia, unspecified: Secondary | ICD-10-CM | POA: Insufficient documentation

## 2014-11-30 DIAGNOSIS — Z8673 Personal history of transient ischemic attack (TIA), and cerebral infarction without residual deficits: Secondary | ICD-10-CM | POA: Diagnosis not present

## 2014-11-30 DIAGNOSIS — I739 Peripheral vascular disease, unspecified: Secondary | ICD-10-CM | POA: Insufficient documentation

## 2014-11-30 DIAGNOSIS — I4892 Unspecified atrial flutter: Secondary | ICD-10-CM | POA: Diagnosis present

## 2014-11-30 DIAGNOSIS — I340 Nonrheumatic mitral (valve) insufficiency: Secondary | ICD-10-CM | POA: Diagnosis not present

## 2014-11-30 HISTORY — PX: TEE WITHOUT CARDIOVERSION: SHX5443

## 2014-11-30 HISTORY — PX: ELECTROPHYSIOLOGIC STUDY: SHX172A

## 2014-11-30 LAB — CBC
HCT: 39.8 % (ref 39.0–52.0)
Hemoglobin: 13.3 g/dL (ref 13.0–17.0)
MCH: 30.2 pg (ref 26.0–34.0)
MCHC: 33.4 g/dL (ref 30.0–36.0)
MCV: 90.5 fL (ref 78.0–100.0)
Platelets: 189 10*3/uL (ref 150–400)
RBC: 4.4 MIL/uL (ref 4.22–5.81)
RDW: 14.2 % (ref 11.5–15.5)
WBC: 6.7 10*3/uL (ref 4.0–10.5)

## 2014-11-30 LAB — BASIC METABOLIC PANEL
Anion gap: 12 (ref 5–15)
BUN: 13 mg/dL (ref 6–20)
CO2: 22 mmol/L (ref 22–32)
Calcium: 8.7 mg/dL — ABNORMAL LOW (ref 8.9–10.3)
Chloride: 105 mmol/L (ref 101–111)
Creatinine, Ser: 0.83 mg/dL (ref 0.61–1.24)
GFR calc Af Amer: >60 mL/min (ref >60)
GFR calc non Af Amer: >60 mL/min (ref >60)
Glucose, Bld: 143 mg/dL — ABNORMAL HIGH (ref 70–99)
Potassium: 3.7 mmol/L (ref 3.5–5.1)
Sodium: 139 mmol/L (ref 135–145)

## 2014-11-30 LAB — PROTIME-INR
INR: 2.36 — ABNORMAL HIGH (ref 0.00–1.49)
Prothrombin Time: 26 seconds — ABNORMAL HIGH (ref 11.6–15.2)

## 2014-11-30 LAB — GLUCOSE, CAPILLARY
Glucose-Capillary: 129 mg/dL — ABNORMAL HIGH (ref 70–99)
Glucose-Capillary: 128 mg/dL — ABNORMAL HIGH (ref 70–99)
Glucose-Capillary: 146 mg/dL — ABNORMAL HIGH (ref 70–99)

## 2014-11-30 LAB — POCT ACTIVATED CLOTTING TIME
Activated Clotting Time: 294 seconds
Activated Clotting Time: 300 seconds
Activated Clotting Time: 294 seconds
Activated Clotting Time: 171 seconds

## 2014-11-30 SURGERY — ATRIAL FIBRILLATION ABLATION
Anesthesia: General

## 2014-11-30 SURGERY — ATRIAL FIBRILLATION ABLATION
Anesthesia: Monitor Anesthesia Care

## 2014-11-30 SURGERY — ECHOCARDIOGRAM, TRANSESOPHAGEAL
Anesthesia: Moderate Sedation

## 2014-11-30 MED ORDER — ONDANSETRON HCL 4 MG/2ML IJ SOLN: 4.0000 mg | Freq: Once | INTRAMUSCULAR | Status: DC | PRN

## 2014-11-30 MED ORDER — HYDROMORPHONE HCL 1 MG/ML IJ SOLN: 0.5000 mg | INTRAMUSCULAR | Status: DC | PRN

## 2014-11-30 MED ORDER — PHENYLEPHRINE HCL 10 MG/ML IJ SOLN
INTRAMUSCULAR | Status: DC | PRN
  Administered 2014-11-30: 80 ug via INTRAVENOUS

## 2014-11-30 MED ORDER — HEPARIN SODIUM (PORCINE) 1000 UNIT/ML IJ SOLN
INTRAMUSCULAR | Status: AC
  Filled 2014-11-30: qty 1

## 2014-11-30 MED ORDER — HYDRALAZINE HCL 20 MG/ML IJ SOLN
INTRAMUSCULAR | Status: DC | PRN
  Administered 2014-11-30: 10 mg via INTRAVENOUS

## 2014-11-30 MED ORDER — FENTANYL CITRATE (PF) 100 MCG/2ML IJ SOLN
INTRAMUSCULAR | Status: DC | PRN
  Administered 2014-11-30 (×3): 50 ug via INTRAVENOUS
  Administered 2014-11-30: 100 ug via INTRAVENOUS

## 2014-11-30 MED ORDER — LACTATED RINGERS IV SOLN
INTRAVENOUS | Status: DC | PRN
  Administered 2014-11-30: 11:00:00 via INTRAVENOUS

## 2014-11-30 MED ORDER — SODIUM CHLORIDE 0.9 % IJ SOLN: 3.0000 mL | INTRAMUSCULAR | Status: DC | PRN

## 2014-11-30 MED ORDER — SODIUM CHLORIDE 0.9 % IV SOLN: 250.0000 mL | INTRAVENOUS | Status: DC | PRN

## 2014-11-30 MED ORDER — ARTIFICIAL TEARS OP OINT
TOPICAL_OINTMENT | OPHTHALMIC | Status: DC | PRN
  Administered 2014-11-30: 1 via OPHTHALMIC

## 2014-11-30 MED ORDER — WARFARIN SODIUM 5 MG PO TABS
5.0000 mg | ORAL_TABLET | ORAL | Status: DC
  Administered 2014-11-30: 5 mg via ORAL
  Filled 2014-11-30: qty 1

## 2014-11-30 MED ORDER — INSULIN ASPART 100 UNIT/ML ~~LOC~~ SOLN
0.0000 [IU] | Freq: Three times a day (TID) | SUBCUTANEOUS | Status: DC
  Administered 2014-11-30 – 2014-12-01 (×2): 1 [IU] via SUBCUTANEOUS
  Filled 2014-11-30: qty 0.09

## 2014-11-30 MED ORDER — ROCURONIUM BROMIDE 100 MG/10ML IV SOLN
INTRAVENOUS | Status: DC | PRN
  Administered 2014-11-30: 10 mg via INTRAVENOUS
  Administered 2014-11-30: 30 mg via INTRAVENOUS
  Administered 2014-11-30: 20 mg via INTRAVENOUS

## 2014-11-30 MED ORDER — SODIUM CHLORIDE 0.9 % IV SOLN
2.0000 ug/min | INTRAVENOUS | Status: AC
Start: 2014-11-30 — End: 2014-11-30
  Administered 2014-11-30: 10 ug/min via INTRAVENOUS
  Filled 2014-11-30: qty 2

## 2014-11-30 MED ORDER — GLYCOPYRROLATE 0.2 MG/ML IJ SOLN
INTRAMUSCULAR | Status: DC | PRN
  Administered 2014-11-30: 0.6 mg via INTRAVENOUS

## 2014-11-30 MED ORDER — NEOSTIGMINE METHYLSULFATE 10 MG/10ML IV SOLN
INTRAVENOUS | Status: DC | PRN
  Administered 2014-11-30: 4 mg via INTRAVENOUS

## 2014-11-30 MED ORDER — METOPROLOL TARTRATE 25 MG PO TABS
50.0000 mg | ORAL_TABLET | Freq: Two times a day (BID) | ORAL | Status: DC
  Administered 2014-11-30 – 2014-12-01 (×2): 50 mg via ORAL
  Filled 2014-11-30 (×2): qty 2

## 2014-11-30 MED ORDER — IRBESARTAN 75 MG PO TABS
150.0000 mg | ORAL_TABLET | Freq: Every day | ORAL | Status: DC
  Administered 2014-11-30 – 2014-12-01 (×2): 150 mg via ORAL
  Filled 2014-11-30 (×2): qty 2

## 2014-11-30 MED ORDER — PROTAMINE SULFATE 10 MG/ML IV SOLN
INTRAVENOUS | Status: DC | PRN
  Administered 2014-11-30 (×3): 10 mg via INTRAVENOUS

## 2014-11-30 MED ORDER — HYDROCODONE-ACETAMINOPHEN 5-325 MG PO TABS
1.0000 | ORAL_TABLET | ORAL | Status: DC | PRN
  Administered 2014-11-30: 19:00:00 1 via ORAL
  Filled 2014-11-30: qty 1

## 2014-11-30 MED ORDER — PROPOFOL 10 MG/ML IV BOLUS
INTRAVENOUS | Status: DC | PRN
  Administered 2014-11-30: 30 mg via INTRAVENOUS
  Administered 2014-11-30: 270 mg via INTRAVENOUS

## 2014-11-30 MED ORDER — WARFARIN SODIUM 5 MG PO TABS: 5.0000 mg | ORAL_TABLET | Freq: Every day | ORAL | Status: DC

## 2014-11-30 MED ORDER — WARFARIN - PHYSICIAN DOSING INPATIENT: Freq: Every day | Status: DC

## 2014-11-30 MED ORDER — PHENOL 1.4 % MT LIQD
1.0000 | OROMUCOSAL | Status: DC | PRN
  Administered 2014-11-30: 19:00:00 1 via OROMUCOSAL
  Filled 2014-11-30: qty 177

## 2014-11-30 MED ORDER — SUCCINYLCHOLINE CHLORIDE 20 MG/ML IJ SOLN
INTRAMUSCULAR | Status: DC | PRN
  Administered 2014-11-30: 110 mg via INTRAVENOUS

## 2014-11-30 MED ORDER — HYDRALAZINE HCL 20 MG/ML IJ SOLN
INTRAMUSCULAR | Status: AC
  Filled 2014-11-30: qty 1

## 2014-11-30 MED ORDER — HEPARIN SODIUM (PORCINE) 1000 UNIT/ML IJ SOLN
INTRAMUSCULAR | Status: DC | PRN
  Administered 2014-11-30 (×2): 2000 [IU] via INTRAVENOUS

## 2014-11-30 MED ORDER — ONDANSETRON HCL 4 MG/2ML IJ SOLN: 4.0000 mg | Freq: Four times a day (QID) | INTRAMUSCULAR | Status: DC | PRN

## 2014-11-30 MED ORDER — BUPIVACAINE HCL (PF) 0.25 % IJ SOLN
INTRAMUSCULAR | Status: AC
  Filled 2014-11-30: qty 30

## 2014-11-30 MED ORDER — MIDAZOLAM HCL 5 MG/ML IJ SOLN
INTRAMUSCULAR | Status: AC
  Filled 2014-11-30: qty 2

## 2014-11-30 MED ORDER — VALACYCLOVIR HCL 500 MG PO TABS
500.0000 mg | ORAL_TABLET | Freq: Every day | ORAL | Status: DC
  Filled 2014-11-30: qty 1

## 2014-11-30 MED ORDER — BUTAMBEN-TETRACAINE-BENZOCAINE 2-2-14 % EX AERO
INHALATION_SPRAY | CUTANEOUS | Status: DC | PRN
  Administered 2014-11-30: 2 via TOPICAL

## 2014-11-30 MED ORDER — FENTANYL CITRATE (PF) 100 MCG/2ML IJ SOLN
INTRAMUSCULAR | Status: AC
  Filled 2014-11-30: qty 2

## 2014-11-30 MED ORDER — WARFARIN SODIUM 7.5 MG PO TABS
7.5000 mg | ORAL_TABLET | ORAL | Status: DC
  Filled 2014-11-30: qty 1

## 2014-11-30 MED ORDER — ACETAMINOPHEN 325 MG PO TABS: 650.0000 mg | ORAL_TABLET | ORAL | Status: DC | PRN

## 2014-11-30 MED ORDER — ONDANSETRON HCL 4 MG/2ML IJ SOLN
INTRAMUSCULAR | Status: DC | PRN
  Administered 2014-11-30: 4 mg via INTRAVENOUS

## 2014-11-30 MED ORDER — SODIUM CHLORIDE 0.9 % IJ SOLN: 3.0000 mL | Freq: Two times a day (BID) | INTRAMUSCULAR | Status: DC

## 2014-11-30 MED ORDER — FENTANYL CITRATE (PF) 100 MCG/2ML IJ SOLN
INTRAMUSCULAR | Status: DC | PRN
  Administered 2014-11-30 (×2): 25 ug via INTRAVENOUS

## 2014-11-30 MED ORDER — OFF THE BEAT BOOK
Freq: Once | Status: AC
  Administered 2014-11-30: 21:00:00
  Filled 2014-11-30: qty 1

## 2014-11-30 MED ORDER — MIDAZOLAM HCL 10 MG/2ML IJ SOLN
INTRAMUSCULAR | Status: DC | PRN
  Administered 2014-11-30 (×3): 2 mg via INTRAVENOUS

## 2014-11-30 MED ORDER — IOHEXOL 350 MG/ML SOLN
INTRAVENOUS | Status: DC | PRN
  Administered 2014-11-30: 105 mL via INTRAVENOUS

## 2014-11-30 SURGICAL SUPPLY — 19 items
BAG SNAP BAND KOVER 36X36 (MISCELLANEOUS) ×2 IMPLANT
CATH DIAG 6FR PIGTAIL (CATHETERS) ×2 IMPLANT
CATH JOSEPHSON QUAD-ALLRED 6FR (CATHETERS) ×2 IMPLANT
CATH NAVISTAR SMARTTOUCH DF (ABLATOR) ×2 IMPLANT
CATH SOUNDSTAR 3D IMAGING (CATHETERS) ×2 IMPLANT
CATH VARIABLE LASSO NAV 2515 (CATHETERS) ×2 IMPLANT
CATH WEBSTER BI DIR CS D-F CRV (CATHETERS) ×2 IMPLANT
HOVERMATT SINGLE USE (MISCELLANEOUS) ×2 IMPLANT
NEEDLE TRANSEP BRK 71CM 407200 (NEEDLE) ×2 IMPLANT
PACK EP LATEX FREE (CUSTOM PROCEDURE TRAY) ×1
PACK EP LF (CUSTOM PROCEDURE TRAY) ×1 IMPLANT
PAD DEFIB LIFELINK (PAD) ×2 IMPLANT
PATCH CARTO3 (PAD) ×2 IMPLANT
SHEATH AVANTI 11F 11CM (SHEATH) ×2 IMPLANT
SHEATH PINNACLE 7F 10CM (SHEATH) ×4 IMPLANT
SHEATH PINNACLE 9F 10CM (SHEATH) ×2 IMPLANT
SHEATH SWARTZ TS SL2 63CM 8.5F (SHEATH) ×2 IMPLANT
TUBING CONTRAST HIGH PRESS 48 (TUBING) ×2 IMPLANT
TUBING SMART ABLATE COOLFLOW (TUBING) ×2 IMPLANT

## 2014-11-30 NOTE — Progress Notes (Signed)
  Echocardiogram Echocardiogram Transesophageal has been performed.  Bobbye Charleston 11/30/2014, 8:56 AM

## 2014-11-30 NOTE — Interval H&P Note (Signed)
History and Physical Interval Note:  11/30/2014 8:00 AM  [name]  has presented today for surgery, with the diagnosis of afib  The various methods of treatment have been discussed with the patient and family. After consideration of risks, benefits and other options for treatment, the patient has consented to  Procedure(s): TRANSESOPHAGEAL ECHOCARDIOGRAM (TEE) (N/A) as a surgical intervention .  The patient's history has been reviewed, patient examined, no change in status, stable for surgery.  I have reviewed the patient's chart and labs.  Questions were answered to the patient's satisfaction.     Jenkins Rouge

## 2014-11-30 NOTE — Transfer of Care (Signed)
Immediate Anesthesia Transfer of Care Note  Patient: [name]  Procedure(s) Performed: Procedure(s): Atrial Fibrillation Ablation (N/A)  Patient Location: Cath Lab  Anesthesia Type:General  Level of Consciousness: awake, alert  and oriented  Airway & Oxygen Therapy: Patient Spontanous Breathing, Patient connected to face mask oxygen and pt placed on CPAP per respiratory  Post-op Assessment: Report given to RN and Post -op Vital signs reviewed and stable  Post vital signs: Reviewed and stable  Last Vitals:  Filed Vitals:   11/30/14 1456  BP:   Pulse: 83  Temp:   Resp: 18    Complications: No apparent anesthesia complications

## 2014-11-30 NOTE — Progress Notes (Signed)
Site area:right groin a 7, 9, 11 french venous sheath was removed  Site Prior to Removal:  Level 0  Pressure Applied For 30 MINUTES    Minutes Beginning at 1550  Manual:   Yes.    Patient Status During Pull:  stable  Post Pull Groin Site:  Level 0  Post Pull Instructions Given:  Yes.    Post Pull Pulses Present:  Yes.    Dressing Applied:  Yes.    Comments:  VS remain stable during sheath pull.  Pt denies any discomfort at site at this time.

## 2014-11-30 NOTE — Progress Notes (Signed)
Assisted pt. With placing on cpap. Pt.tolerating well at this time. Pt. Has 3L of oxygen titrated into the cpap.

## 2014-11-30 NOTE — Interval H&P Note (Signed)
History and Physical Interval Note:  11/30/2014 7:20 AM  [name]  has presented today for surgery, with the diagnosis of afib  The various methods of treatment have been discussed with the patient and family. After consideration of risks, benefits and other options for treatment, the patient has consented to  Procedure(s): Atrial Fibrillation Ablation (N/A) as a surgical intervention .  The patient's history has been reviewed, patient examined, no change in status, stable for surgery.  I have reviewed the patient's chart and labs.  Questions were answered to the patient's satisfaction.     [name]

## 2014-11-30 NOTE — Discharge Summary (Signed)
ELECTROPHYSIOLOGY PROCEDURE DISCHARGE SUMMARY    Patient ID: [name],  MRN: [id number], DOB/AGE: 1951-02-20 64 y.o.  Admit date: 11/30/2014 Discharge date: 12/01/2014  Primary Care Physician: Travis Haste, MD Primary Cardiologist: [name] Electrophysiologist: Travis Grayer, MD  Primary Discharge Diagnosis:  Paroxysmal atrial fibrillation and typical atrial flutter status post ablation this admission  Secondary Discharge Diagnosis:  1.  Prior CVA 2.  OSA - on CPAP 3.  Obesity 4.  Type II diabetes 5.  Chronic diastolic heart failure 6.  Hypertension 7.  Hyperlipidemia 8.  CAD  Procedures This Admission:  1.  Electrophysiology study and radiofrequency catheter ablation on 11/30/14 by Dr [name].  This study demonstrated sinus rhythm upon presentation; rotational angiography reveals a moderate sized left atrium with four separate pulmonary veins without evidence of pulmonary vein stenosis. The left superior pulmonary vein was small. There was a common segment to the left superior and left inferior pulmonary veins; successful electrical isolation and anatomical encircling of all four pulmonary veins with radiofrequency current (WACA approach); cavo-tricuspid isthmus ablation was performed with complete bidirectional isthmus block achieved; 3 atypical atrial flutters arising from the left atrium were induced but were too unstable and therefore not amenable to mapping or ablation today.  There were no early apparent complications.    Brief HPI: [name] is a 64 y.o. male with a history of atrial fibrillation and atrial flutter.  He underwent ablation in 2009 and did well initially but developed recurrent atrial arrhythmias.  They have failed medical therapy with Sotalol. Risks, benefits, and alternatives to catheter ablation of atrial fibrillation were reviewed with the patient who wished to proceed.  The patient underwent TEE prior to the procedure which demonstrated  normal LV function and no LAA thrombus.    Hospital Course:  The patient was admitted and underwent EPS/RFCA of atrial fibrillation with details as outlined above.  They were monitored on telemetry overnight which demonstrated sinus rhythm.  Groin was without complication on the day of discharge.  The patient was examined and considered to be stable for discharge.  Wound care and restrictions were reviewed with the patient.  The patient will be seen back by Travis Palau, NP in 4 weeks and Dr [name] in 12 weeks for post ablation follow up.   This patients CHA2DS2-VASc Score and unadjusted Ischemic Stroke Rate (% per year) is equal to 7.2 % stroke rate/year from a score of 5 Above score calculated as 1 point each if present [CHF, HTN, DM, Vascular=MI/PAD/Aortic Plaque, Age if 65-74, or Male] Above score calculated as 2 points each if present [Age > 75, or Stroke/TIA/TE]   Physical Exam: Filed Vitals:   12/01/14 0100 12/01/14 0200 12/01/14 0300 12/01/14 0400  BP: 130/67 160/83 151/86 129/68  Pulse: 77 75 77 79  Temp:    98.1 F (36.7 C)  TempSrc:    Oral  Resp:    20  Height:      Weight:      SpO2: 94% 95% 97% 94%    GEN- The patient is obese appearing, alert and oriented x 3 today.   HEENT: normocephalic, atraumatic; sclera clear, conjunctiva pink; hearing intact; oropharynx clear; neck supple, no JVP Lymph- no cervical lymphadenopathy Lungs- Clear to ausculation bilaterally, normal work of breathing.  No wheezes, rales, rhonchi Heart- Regular rate and rhythm, no murmurs, rubs or gallops  GI- soft, non-tender, non-distended, bowel sounds present  Extremities- no clubbing, cyanosis, or edema; DP/PT/radial pulses 2+ bilaterally, groin without hematoma/bruit  MS- no significant deformity or atrophy Skin- warm and dry, no rash or lesion Psych- euthymic mood, full affect Neuro- strength and sensation are intact   Labs:   Lab Results  Component Value Date   WBC 6.7 11/30/2014    HGB 13.3 11/30/2014   HCT 39.8 11/30/2014   MCV 90.5 11/30/2014   PLT 189 11/30/2014     Recent Labs Lab 12/01/14 0311  NA 138  K 3.6  CL 102  CO2 25  BUN 11  CREATININE 0.88  CALCIUM 8.2*  GLUCOSE 125*     Discharge Medications:    Medication List    TAKE these medications        ACCU-CHEK FASTCLIX LANCETS Misc  USE TO TEST BLOOD SUGAR DAILY     ANDROGEL PUMP 12.5 MG/ACT (1%) Gel  Generic drug:  Testosterone  Place 1 application onto the skin daily.     atorvastatin 80 MG tablet  Commonly known as:  LIPITOR  TAKE ONE TABLET BY MOUTH ONCE DAILY     b complex vitamins capsule  Take 1 capsule by mouth daily.     furosemide 40 MG tablet  Commonly known as:  LASIX  Take 1 tablet (40 mg total) by mouth daily.     glucose blood test strip  Commonly known as:  ACCU-CHEK AVIVA PLUS  50 each by Other route as needed for other. Use as instructed     isosorbide mononitrate 30 MG 24 hr tablet  Commonly known as:  IMDUR  TAKE ONE TABLET BY MOUTH ONCE DAILY     metFORMIN 1000 MG tablet  Commonly known as:  GLUCOPHAGE  Take 1 tablet (1,000 mg total) by mouth 2 (two) times daily with a meal. Ok to resume 12/02/14     metoprolol 50 MG tablet  Commonly known as:  LOPRESSOR  TAKE ONE TABLET BY MOUTH TWICE DAILY     omega-3 acid ethyl esters 1 G capsule  Commonly known as:  LOVAZA  Take 2 capsules (2 g total) by mouth 2 (two) times daily.     pantoprazole 40 MG tablet  Commonly known as:  PROTONIX  Take 1 tablet (40 mg total) by mouth daily.     sotalol 160 MG tablet  Commonly known as:  BETAPACE  Take 1 tablet (160 mg total) by mouth 2 (two) times daily.     valACYclovir 500 MG tablet  Commonly known as:  VALTREX  Take 1 tablet (500 mg total) by mouth daily.     valsartan 160 MG tablet  Commonly known as:  DIOVAN  Take 1 tablet (160 mg total) by mouth daily.     Vitamin D3 10000 UNITS capsule  Take 10,000 Units by mouth daily.     warfarin 5 MG tablet    Commonly known as:  COUMADIN  Take as directed by [facility]madin Clinic        Disposition:  Discharge Instructions    Diet - low sodium heart healthy    Complete by:  As directed      Increase activity slowly    Complete by:  As directed           Follow-up Information    Follow up with Travis Grayer, MD On 03/07/2015.   Specialty:  Cardiology   Why:  at 2:30PM   Contact information:   [place] [address] [phone]       Follow up with [facility] On 12/07/2014.  Why:  at 9:30AM for Coumadin check   Contact information:   [address] [place] [address]       Follow up with [facility] On 12/28/2014.   Why:  at [employer] information:   [place] [id number] [phone]      Duration of Discharge Encounter: Greater than 30 minutes including physician time.  Signed, Chanetta Marshall, NP 12/01/2014 8:23 AM    I have seen, examined the patient, and reviewed the above assessment and plan.  On exam, RRR  Changes to above are made where necessary.   DC to home.  Add PPI x 6 weeks.  Continue other medicines.  Co Sign: Travis Grayer, MD 12/01/2014 8:30 AM

## 2014-11-30 NOTE — Anesthesia Procedure Notes (Signed)
Procedure Name: Intubation Date/Time: 11/30/2014 11:02 AM Performed by: Susa Loffler Pre-anesthesia Checklist: Patient identified, Timeout performed, Emergency Drugs available, Suction available and Patient being monitored Patient Re-evaluated:Patient Re-evaluated prior to inductionOxygen Delivery Method: Circle system utilized Preoxygenation: Pre-oxygenation with 100% oxygen Intubation Type: IV induction Ventilation: Oral airway inserted - appropriate to patient size and Two handed mask ventilation required Laryngoscope Size: Mac and 4 Grade View: Grade I Tube type: Oral Tube size: 7.5 mm Number of attempts: 1 Airway Equipment and Method: Stylet and Oral airway Placement Confirmation: ETT inserted through vocal cords under direct vision,  positive ETCO2 and breath sounds checked- equal and bilateral Secured at: 23 cm Tube secured with: Tape Dental Injury: Teeth and Oropharynx as per pre-operative assessment

## 2014-11-30 NOTE — H&P (View-Only) (Signed)
Electrophysiology Office Note   Date:  11/05/2014   ID:  [name], DOB [birth year], MRN [id number]  PCP:  Wyatt Haste, MD  Cardiologist:  Dr Radford Pax Primary Electrophysiologist: [name], MD    Chief Complaint  Patient presents with  . Atrial Fibrillation    Paroxysmal     History of Present Illness: [name] is a 64 y.o. male who presents today for electrophysiology evaluation.   Since his recent hospital discharge, he has done "OK".  He has had no further atrial arrhythmias but has noticed significant SOB at rest and with minimal exertion.  He denies CP.  Today, he denies symptoms of palpitations, chest pain, orthopnea, PND, lower extremity edema, claudication, dizziness, presyncope, syncope, bleeding, or neurologic sequela. The patient is tolerating medications without difficulties and is otherwise without complaint today.    Past Medical History  Diagnosis Date  . Cerebrovascular accident     a. 03/2008 secondary to cardioembolic event;  b. chronic coumadin.  . Central retinal artery occlusion, left eye     diagnosed in 2008  . Obstructive sleep apnea   . Morbid obesity   . Skin infection     chronic left leg herpetic  . Bell's palsy   . Diabetes mellitus 11/14/2010    TYPE II  . Obesity   . Chronic diastolic CHF (congestive heart failure)     a. 12/2008 Echo: nl EF.  [name] Hypogonadism, male 05/2013    Dr. Estill Dooms, Urology  . Hypertension   . Hyperlipidemia   . Persistent atrial fibrillation     a. s/p afib ablation by [facility] 08/01/08  . Atrial flutter     a. atypical atrial flutter.  [name] CAD (coronary artery disease)     a. 07/2009 Cath: LM nl, LAD nl, LCX nl w/ L->R collats, chronically occluded RCA s/p failed PCI;  b. 07/2012 MV: basal inf mild ischemia.  . Recommendation refused by patient     multiple recommended vaccines refused over the years (flu/pneumovax)  . Hematuria 05/2013    Urology, Dr. Estill Dooms  . Atrial fibrillation    Past Surgical  History  Procedure Laterality Date  . Atrial ablation surgery  08/01/08    CTI and PVI ablation by [facility]  . Colonoscopy      2012 per patient     Current Outpatient Prescriptions  Medication Sig Dispense Refill  . ACCU-CHEK FASTCLIX LANCETS MISC USE TO TEST BLOOD SUGAR DAILY 102 each 0  . atorvastatin (LIPITOR) 80 MG tablet TAKE ONE TABLET BY MOUTH ONCE DAILY 30 tablet 6  . glucose blood (ACCU-CHEK AVIVA PLUS) test strip 50 each by Other route as needed for other. Use as instructed 50 each 10  . isosorbide mononitrate (IMDUR) 30 MG 24 hr tablet TAKE ONE TABLET BY MOUTH ONCE DAILY 30 tablet 6  . metFORMIN (GLUCOPHAGE) 1000 MG tablet Take 1 tablet (1,000 mg total) by mouth 2 (two) times daily with a meal. 180 tablet 1  . metoprolol (LOPRESSOR) 50 MG tablet TAKE ONE TABLET BY MOUTH TWICE DAILY 60 tablet 6  . omega-3 acid ethyl esters (LOVAZA) 1 G capsule Take 2 capsules (2 g total) by mouth 2 (two) times daily. 120 capsule 2  . sotalol (BETAPACE) 160 MG tablet Take 1 tablet (160 mg total) by mouth 2 (two) times daily. 60 tablet 11  . Testosterone (ANDROGEL PUMP) 1.25 GM/ACT (1%) GEL Place 1 application onto the skin daily.     . valACYclovir (VALTREX) 500 MG tablet Take  1 tablet (500 mg total) by mouth daily. 90 tablet 3  . valsartan (DIOVAN) 160 MG tablet Take 1 tablet (160 mg total) by mouth daily. 30 tablet 6  . warfarin (COUMADIN) 5 MG tablet Take as directed by [facility] (Patient taking differently: Take 5-7.5 mg by mouth daily. Monday, Wednesday, Friday, Saturday = 7.5mg      Sunday, Tuesday and Thursday = 5mg ) 40 tablet 3  . b complex vitamins capsule Take 1 capsule by mouth daily.    . Cholecalciferol (VITAMIN D3) 10000 UNITS capsule OTC Take 10,000-20,000 units by mouth daily    . furosemide (LASIX) 40 MG tablet Take 1 tablet (40 mg total) by mouth daily. 30 tablet 3  . nitroGLYCERIN (NITROSTAT) 0.4 MG SL tablet Place 0.4 mg under the tongue every 5 (five) minutes as needed for chest  pain (MAX 3 TABLETS).      No current facility-administered medications for this visit.    Allergies:   Crestor; Zetia; and Lisinopril   Social History:  The patient  reports that he quit smoking about 35 years ago. He does not have any smokeless tobacco history on file. He reports that he does not drink alcohol or use illicit drugs.   Family History:  The patient's  family history includes Heart disease in his father and mother; Stroke in his father.    ROS:  Please see the history of present illness.   All other systems are reviewed and negative.    PHYSICAL EXAM: VS:  BP 132/74 mmHg  Pulse 66  Ht 5\' 10"  (1.778 m)  Wt 280 lb (127.007 kg)  BMI 40.18 kg/m2 , BMI Body mass index is 40.18 kg/(m^2). GEN: Well nourished, well developed, in no acute distress HEENT: normal Neck: + JVD, carotid bruits, or masses Cardiac: RRR; no murmurs, rubs, or gallops,trace edema  Respiratory:  Few basilar rales, normal work of breathing GI: soft, nontender, nondistended, + BS MS: no deformity or atrophy Skin: warm and dry  Neuro:  Strength and sensation are intact, stable L facial droop Psych: euthymic mood, full affect  EKG:  EKG is ordered today. The ekg ordered today shows sinus rhythm   Recent Labs: 03/10/2014: ALT 26 10/29/2014: BUN 17; Creatinine 0.91; Hemoglobin 14.9; Platelets 227; Potassium 4.6; Sodium 138    Lipid Panel     Component Value Date/Time   CHOL 109 03/10/2014 1151   TRIG 126.0 03/10/2014 1151   HDL 32.60* 03/10/2014 1151   CHOLHDL 3 03/10/2014 1151   VLDL 25.2 03/10/2014 1151   LDLCALC 51 03/10/2014 1151   LDLDIRECT 87.7 05/08/2013 0837     Wt Readings from Last 3 Encounters:  11/04/14 280 lb (127.007 kg)  09/23/14 280 lb (127.007 kg)  06/22/14 269 lb (122.018 kg)      Other studies Reviewed: Additional studies/ records that were reviewed today include: recent hospital records, prior echo  Review of the above records today demonstrates: last echo  2010   ASSESSMENT AND PLAN:  1.  Paroxysmal atrial fibrillation and atrial flutter The patient has had recurrent arrhythmias, most recently typical appearing atrial flutter despite medical therapy with tikosyn.  His chads2vasc score is at least 5.  He is appropriately anticoagulated. Therapeutic strategies for afib/ atrial flutter including medicine and repeat catheter ablation were discussed in detail with the patient today. Risk, benefits, and alternatives to EP study and radiofrequency ablation for afib were also discussed in detail today. These risks include but are not limited to stroke, bleeding, vascular damage,  tamponade, perforation, damage to the esophagus, lungs, and other structures, pulmonary vein stenosis, worsening renal function, and death. The patient understands these risk and wishes to proceed.  We will therefore proceed with catheter ablation at the next available time.  I will obtain an echo prior to ablation to evaluate LA size and EF.  He will also have preprocedure TEE to evaluate for significant valvular dysfunction or thrombus.  Compliance with CPAP is encouraged.  Lifestyle modification including regular exercise and weight reduction strategies were also discussed.  Will need weekly INRs prior to ablation.  2. Worsening SOB/ CAD He has known CAD.  Given worsening SOB recently, will obtain myoview prior to repeat catheter ablation.  If low risk, will proceed with ablation. Lasix 40mg  daily will be given x 3 days starting today  3. HTN Stable No change required today   Current medicines are reviewed at length with the patient today.   The patient does not have concerns regarding his medicines.  The following changes were made today:  none  Labs/ tests ordered today include:  Orders Placed This Encounter  Procedures  . Myocardial Perfusion Imaging  . EKG 12-Lead  . 2D Echocardiogram without contrast    Signed, [name], MD  11/05/2014 11:28 AM     [place] [place] [address] [place] [address] [phone] (office) [phone] (fax)

## 2014-11-30 NOTE — CV Procedure (Signed)
TEE:  5 mg versed  50ug fentanyl   Patient HTN- given 10 mg iv hydralazine  Normal LV EF 60% Normal atria Small PFO by color flow Normal AV Mild MR No LAA thrombus Normal RV No pericardial effusion Mild mural aortic debris  Ok to proceed with ablations Discussed with Dr Brigid Re

## 2014-11-30 NOTE — Discharge Instructions (Signed)
No driving for 5 days. No lifting over 5 lbs for 1 week. No sexual activity for 1 week. You may return to work in 1 week. Keep procedure site clean & dry. If you notice increased pain, swelling, bleeding or pus, call/return!  You may shower, but no soaking baths/hot tubs/pools for 1 week.  ° ° °

## 2014-11-30 NOTE — Anesthesia Postprocedure Evaluation (Signed)
  Anesthesia Post-op Note  Patient: [name]  Procedure(s) Performed: Procedure(s): Atrial Fibrillation Ablation (N/A)  Patient Location: PACU  Anesthesia Type:General  Level of Consciousness: awake, alert , oriented and patient cooperative  Airway and Oxygen Therapy: Patient Spontanous Breathing  Post-op Pain: none  Post-op Assessment: Post-op Vital signs reviewed, Patient's Cardiovascular Status Stable, Respiratory Function Stable, Patent Airway, No signs of Nausea or vomiting and Pain level controlled  Post-op Vital Signs: stable  Last Vitals:  Filed Vitals:   11/30/14 1456  BP:   Pulse: 83  Temp:   Resp: 18    Complications: No apparent anesthesia complications

## 2014-11-30 NOTE — Anesthesia Preprocedure Evaluation (Signed)
Anesthesia Evaluation  Patient identified by MRN, date of birth, ID band Patient awake    Reviewed: Allergy & Precautions, NPO status , Patient's Chart, lab work & pertinent test results  Airway Mallampati: II  TM Distance: >3 FB     Dental   Pulmonary sleep apnea , former smoker,    Pulmonary exam normal       Cardiovascular hypertension, + CAD, + Peripheral Vascular Disease and +CHF + dysrhythmias Atrial Fibrillation Rhythm:Irregular Rate:Abnormal     Neuro/Psych CVA    GI/Hepatic   Endo/Other  diabetes, Type 2, Oral Hypoglycemic AgentsMorbid obesity  Renal/GU      Musculoskeletal   Abdominal   Peds  Hematology   Anesthesia Other Findings   Reproductive/Obstetrics                             Anesthesia Physical Anesthesia Plan  ASA: III  Anesthesia Plan: General   Post-op Pain Management:    Induction: Intravenous  Airway Management Planned: Oral ETT  Additional Equipment:   Intra-op Plan:   Post-operative Plan: Extubation in OR  Informed Consent: I have reviewed the patients History and Physical, chart, labs and discussed the procedure including the risks, benefits and alternatives for the proposed anesthesia with the patient or authorized representative who has indicated his/her understanding and acceptance.     Plan Discussed with: CRNA, Anesthesiologist and Surgeon  Anesthesia Plan Comments:         Anesthesia Quick Evaluation

## 2014-12-01 ENCOUNTER — Encounter (HOSPITAL_COMMUNITY): Payer: Self-pay | Admitting: Cardiovascular Disease

## 2014-12-01 DIAGNOSIS — I5032 Chronic diastolic (congestive) heart failure: Secondary | ICD-10-CM | POA: Diagnosis not present

## 2014-12-01 DIAGNOSIS — G4733 Obstructive sleep apnea (adult) (pediatric): Secondary | ICD-10-CM | POA: Diagnosis not present

## 2014-12-01 DIAGNOSIS — I480 Paroxysmal atrial fibrillation: Secondary | ICD-10-CM | POA: Diagnosis not present

## 2014-12-01 DIAGNOSIS — I2510 Atherosclerotic heart disease of native coronary artery without angina pectoris: Secondary | ICD-10-CM | POA: Diagnosis not present

## 2014-12-01 DIAGNOSIS — I10 Essential (primary) hypertension: Secondary | ICD-10-CM | POA: Diagnosis not present

## 2014-12-01 LAB — GLUCOSE, CAPILLARY: Glucose-Capillary: 136 mg/dL — ABNORMAL HIGH (ref 70–99)

## 2014-12-01 LAB — BASIC METABOLIC PANEL
Anion gap: 11 (ref 5–15)
BUN: 11 mg/dL (ref 6–20)
CO2: 25 mmol/L (ref 22–32)
Calcium: 8.2 mg/dL — ABNORMAL LOW (ref 8.9–10.3)
Chloride: 102 mmol/L (ref 101–111)
Creatinine, Ser: 0.88 mg/dL (ref 0.61–1.24)
GFR calc Af Amer: >60 mL/min (ref >60)
GFR calc non Af Amer: >60 mL/min (ref >60)
Glucose, Bld: 125 mg/dL — ABNORMAL HIGH (ref 70–99)
Potassium: 3.6 mmol/L (ref 3.5–5.1)
Sodium: 138 mmol/L (ref 135–145)

## 2014-12-01 LAB — PROTIME-INR
INR: 2.44 — ABNORMAL HIGH (ref 0.00–1.49)
Prothrombin Time: 26.7 seconds — ABNORMAL HIGH (ref 11.6–15.2)

## 2014-12-01 MED ORDER — PANTOPRAZOLE SODIUM 40 MG PO TBEC: 40.0000 mg | DELAYED_RELEASE_TABLET | Freq: Every day | ORAL | Status: DC

## 2014-12-01 MED ORDER — METFORMIN HCL 1000 MG PO TABS: 1000.0000 mg | ORAL_TABLET | Freq: Two times a day (BID) | ORAL | Status: DC

## 2014-12-01 MED FILL — Bupivacaine HCl Preservative Free (PF) Inj 0.25%: INTRAMUSCULAR | Qty: 30 | Status: AC

## 2014-12-07 ENCOUNTER — Ambulatory Visit (INDEPENDENT_AMBULATORY_CARE_PROVIDER_SITE_OTHER): Payer: Medicaid Other | Admitting: *Deleted

## 2014-12-07 DIAGNOSIS — Z5181 Encounter for therapeutic drug level monitoring: Secondary | ICD-10-CM | POA: Diagnosis not present

## 2014-12-07 DIAGNOSIS — I480 Paroxysmal atrial fibrillation: Secondary | ICD-10-CM

## 2014-12-07 DIAGNOSIS — I639 Cerebral infarction, unspecified: Secondary | ICD-10-CM | POA: Diagnosis not present

## 2014-12-07 DIAGNOSIS — I6350 Cerebral infarction due to unspecified occlusion or stenosis of unspecified cerebral artery: Secondary | ICD-10-CM

## 2014-12-07 DIAGNOSIS — I4891 Unspecified atrial fibrillation: Secondary | ICD-10-CM

## 2014-12-07 LAB — POCT INR: INR: 2.4

## 2014-12-17 ENCOUNTER — Other Ambulatory Visit: Payer: Self-pay | Admitting: Medical

## 2014-12-17 ENCOUNTER — Encounter: Payer: Self-pay | Admitting: Cardiology

## 2014-12-17 ENCOUNTER — Ambulatory Visit (INDEPENDENT_AMBULATORY_CARE_PROVIDER_SITE_OTHER): Payer: Medicaid Other | Admitting: Cardiology

## 2014-12-17 VITALS — BP 138/82 | HR 74 | Ht 70.0 in | Wt 286.8 lb

## 2014-12-17 DIAGNOSIS — I480 Paroxysmal atrial fibrillation: Secondary | ICD-10-CM

## 2014-12-17 DIAGNOSIS — I10 Essential (primary) hypertension: Secondary | ICD-10-CM

## 2014-12-17 DIAGNOSIS — G4733 Obstructive sleep apnea (adult) (pediatric): Secondary | ICD-10-CM | POA: Diagnosis not present

## 2014-12-17 DIAGNOSIS — I2510 Atherosclerotic heart disease of native coronary artery without angina pectoris: Secondary | ICD-10-CM

## 2014-12-17 DIAGNOSIS — E782 Mixed hyperlipidemia: Secondary | ICD-10-CM

## 2014-12-17 DIAGNOSIS — I2584 Coronary atherosclerosis due to calcified coronary lesion: Secondary | ICD-10-CM

## 2014-12-17 NOTE — Progress Notes (Signed)
Cardiology Office Note   Date:  12/17/2014   ID:  [name], DOB [birth year], MRN [id number]  PCP:  Travis Haste, MD    Chief Complaint  Patient presents with  . Follow-up    OSA      History of Present Illness:  [name] is a 64 y.o. male with a history of ASCAD, HTN, OSA, morbid obesity, PAF, dyslipidemia who presents today for followup. He is doing well.He recently underwent afib ablation by Dr. Rayann Peterson but unable to ablate atypical atrial flutter.   He denies any chest pain, LE edema or syncope.He has noticed a little fluttering since his procedure.  He occasionally has some DOE with extreme exertion. He tolerates his CPAP well. He tolerates the pressure and mask without problems. He feels rested when he gets up and has no daytime sleepiness.   Past Medical History  Diagnosis Date  . Cerebrovascular accident     a. 03/2008 secondary to cardioembolic event;  b. chronic coumadin.  . Central retinal artery occlusion, left eye     diagnosed in 2008  . Obstructive sleep apnea   . Morbid obesity   . Skin infection     chronic left leg herpetic  . Bell's palsy   . Diabetes mellitus 11/14/2010    TYPE II  . Obesity   . Chronic diastolic CHF (congestive heart failure)     a. 12/2008 Echo: nl EF.  [name] Hypogonadism, male 05/2013    Dr. Estill Dooms, Urology  . Hypertension   . Hyperlipidemia   . Persistent atrial fibrillation     a. s/p afib ablation by [facility] 08/01/08  . Atrial flutter     a. atypical atrial flutter.  [name] CAD (coronary artery disease)     a. 07/2009 Cath: LM nl, LAD nl, LCX nl w/ L->R collats, chronically occluded RCA s/p failed PCI;  b. 07/2012 MV: basal inf mild ischemia.  . Recommendation refused by patient     multiple recommended vaccines refused over the years (flu/pneumovax)  . Hematuria 05/2013    Urology, Dr. Estill Dooms  . Atrial fibrillation     Past Surgical History  Procedure Laterality Date  . Atrial ablation surgery  08/01/08    CTI and  PVI ablation by [facility]  . Colonoscopy      2012 per patient  . Electrophysiologic study N/A 11/30/2014    Procedure: Atrial Fibrillation Ablation;  Surgeon: [name], MD;  Location: [facility];  Service: Cardiovascular;  Laterality: N/A;  . Tee without cardioversion N/A 11/30/2014    Procedure: TRANSESOPHAGEAL ECHOCARDIOGRAM (TEE);  Surgeon: Josue Hector, MD;  Location: [facility] ENDOSCOPY;  Service: Cardiovascular;  Laterality: N/A;     Current Outpatient Prescriptions  Medication Sig Dispense Refill  . ACCU-CHEK FASTCLIX LANCETS MISC USE TO TEST BLOOD SUGAR DAILY 102 each 0  . atorvastatin (LIPITOR) 80 MG tablet TAKE ONE TABLET BY MOUTH ONCE DAILY (Patient taking differently: TAKE 80 MG BY MOUTH ONCE DAILY) 30 tablet 6  . b complex vitamins capsule Take 1 capsule by mouth daily.    . Cholecalciferol (VITAMIN D3) 10000 UNITS capsule Take 10,000 Units by mouth daily.     [name] glucose blood (ACCU-CHEK AVIVA PLUS) test strip 50 each by Other route as needed for other. Use as instructed 50 each 10  . isosorbide mononitrate (IMDUR) 30 MG 24 hr tablet TAKE ONE TABLET BY MOUTH ONCE DAILY (Patient taking differently: TAKE 30 MG BY MOUTH ONCE DAILY) 30 tablet 6  .  metFORMIN (GLUCOPHAGE) 1000 MG tablet Take 1 tablet (1,000 mg total) by mouth 2 (two) times daily with a meal. Ok to resume 12/02/14 180 tablet 1  . metoprolol (LOPRESSOR) 50 MG tablet TAKE ONE TABLET BY MOUTH TWICE DAILY (Patient taking differently: TAKE 50 MG BY MOUTH TWICE DAILY) 60 tablet 6  . omega-3 acid ethyl esters (LOVAZA) 1 G capsule TAKE TWO CAPSULES BY MOUTH TWICE DAILY 120 capsule 0  . pantoprazole (PROTONIX) 40 MG tablet Take 1 tablet (40 mg total) by mouth daily. 45 tablet 0  . sotalol (BETAPACE) 160 MG tablet Take 1 tablet (160 mg total) by mouth 2 (two) times daily. 60 tablet 11  . Testosterone (ANDROGEL PUMP) 1.25 GM/ACT (1%) GEL Place 1 application onto the skin daily.     . valACYclovir (VALTREX) 500 MG tablet Take 1 tablet  (500 mg total) by mouth daily. 90 tablet 3  . valsartan (DIOVAN) 160 MG tablet Take 1 tablet (160 mg total) by mouth daily. 30 tablet 6  . warfarin (COUMADIN) 5 MG tablet Take as directed by [facility] (Patient taking differently: Take 5-7.5 mg by mouth daily. Monday, Wednesday, Friday, Saturday, and Thursdays = 7.5mg      Sunday and Tuesday = 5mg ) 40 tablet 3  . furosemide (LASIX) 40 MG tablet Take 1 tablet (40 mg total) by mouth daily. (Patient not taking: Reported on 11/29/2014) 30 tablet 3   No current facility-administered medications for this visit.    Allergies:   Crestor; Zetia; and Lisinopril    Social History:  The patient  reports that he quit smoking about 35 years ago. He does not have any smokeless tobacco history on file. He reports that he does not drink alcohol or use illicit drugs.   Family History:  The patient's family history includes Heart disease in his father and mother; Stroke in his father.    ROS:  Please see the history of present illness.   Otherwise, review of systems are positive for none.   All other systems are reviewed and negative.    PHYSICAL EXAM: VS:  BP 138/82 mmHg  Pulse 74  Ht 5\' 10"  (1.778 m)  Wt 286 lb 12.8 oz (130.092 kg)  BMI 41.15 kg/m2  SpO2 96% , BMI Body mass index is 41.15 kg/(m^2). GEN: Well nourished, well developed, in no acute distress HEENT: normal Neck: no JVD, carotid bruits, or masses Cardiac: RRR; no murmurs, rubs, or gallops,no edema  Respiratory:  clear to auscultation bilaterally, normal work of breathing GI: soft, nontender, nondistended, + BS MS: no deformity or atrophy Skin: warm and dry, no rash Neuro:  Strength and sensation are intact Psych: euthymic mood, full affect   EKG:  EKG is not ordered today.    Recent Labs: 03/10/2014: ALT 26 11/30/2014: Hemoglobin 13.3; Platelets 189 12/01/2014: BUN 11; Creatinine 0.88; Potassium 3.6; Sodium 138    Lipid Panel    Component Value Date/Time   CHOL 109  03/10/2014 1151   TRIG 126.0 03/10/2014 1151   HDL 32.60* 03/10/2014 1151   CHOLHDL 3 03/10/2014 1151   VLDL 25.2 03/10/2014 1151   LDLCALC 51 03/10/2014 1151   LDLDIRECT 87.7 05/08/2013 0837      Wt Readings from Last 3 Encounters:  12/17/14 286 lb 12.8 oz (130.092 kg)  12/01/14 285 lb 15 oz (129.7 kg)  11/18/14 286 lb (129.729 kg)      ASSESSMENT AND PLAN:  1. ASCAD with chronically occluded RCA with left to right collaterals s/p failed PCI  with no angina - continue Imdur.   - No ASA since he is on Coumadin 2. HTN - controlled - continue Diovan/Lopressor 3. Dyslipidemia - continue fish oil/Zetia/atorvastatin  - check FLP and ALT 4. OSA on CPAP - His d.l showed an AHI of 4.3/hr on 16cm H2O and 96% compliance in using more than 4 hours nightly.  He will continue on current settings.   5. Obesity - I encouraged him to try to get into a walking exercise program and follow a low fat diet and portion control 6. PAF and atypical atrial flutter s/p PVI maintaining NSR. Continue Sotolol /warfarin  Current medicines are reviewed at length with the patient today.  The patient does not have concerns regarding medicines.  The following changes have been made:  no change  Labs/ tests ordered today include: see above assessment and plan No orders of the defined types were placed in this encounter.     Disposition:   FU with me in 6 months   Signed, Sueanne Margarita, MD  12/17/2014 11:02 AM    [facility] [address], [place], [place]  [address] Phone: [phone]; Fax: [phone]

## 2014-12-17 NOTE — Patient Instructions (Signed)
Medication Instructions:  Your physician recommends that you continue on your current medications as directed. Please refer to the Current Medication list given to you today.   Labwork: You have a FASTING lab appointment next Tuesday, May 31. You may come ANY TIME between 7:30 AM and 5:00 PM.  Testing/Procedures: None  Follow-Up: Your physician wants you to follow-up in: 6 months with Dr. Radford Pax. You will receive a reminder letter in the mail two months in advance. If you don't receive a letter, please call our office to schedule the follow-up appointment.   Any Other Special Instructions Will Be Listed Below (If Applicable).

## 2014-12-21 ENCOUNTER — Other Ambulatory Visit: Payer: Medicaid Other

## 2014-12-22 ENCOUNTER — Telehealth: Payer: Self-pay | Admitting: Internal Medicine

## 2014-12-22 NOTE — Telephone Encounter (Addendum)
Left message for patient to return my call  Patient called back said since Saturday his heart rate has been feeling "out of rhythm" even though HR 75-85 BP 160-180/100s. Offered appointment for Friday - he took it but stated if he goes back into normal rhythm he will call us back.

## 2014-12-22 NOTE — Telephone Encounter (Signed)
New Message       Pt calling stating that he recentlty had an ablasion done and said that since Saturday night his heart feels like it is out of rhythm and beating really fast sometimes and then really slow. Please call back and advise.

## 2014-12-24 ENCOUNTER — Ambulatory Visit (HOSPITAL_COMMUNITY): Payer: Medicaid Other | Admitting: Nurse Practitioner

## 2014-12-28 ENCOUNTER — Ambulatory Visit (HOSPITAL_COMMUNITY)
Admit: 2014-12-28 | Discharge: 2014-12-28 | Disposition: A | Discharge status: Home / Self Care | Payer: Medicaid Other | Source: Ambulatory Visit | Attending: Nurse Practitioner | Admitting: Nurse Practitioner

## 2014-12-28 ENCOUNTER — Encounter (HOSPITAL_COMMUNITY): Payer: Self-pay | Admitting: Nurse Practitioner

## 2014-12-28 ENCOUNTER — Ambulatory Visit (INDEPENDENT_AMBULATORY_CARE_PROVIDER_SITE_OTHER): Payer: Medicaid Other | Admitting: *Deleted

## 2014-12-28 VITALS — BP 150/98 | HR 75 | Ht 70.0 in | Wt 286.8 lb

## 2014-12-28 DIAGNOSIS — I639 Cerebral infarction, unspecified: Secondary | ICD-10-CM

## 2014-12-28 DIAGNOSIS — I6350 Cerebral infarction due to unspecified occlusion or stenosis of unspecified cerebral artery: Secondary | ICD-10-CM

## 2014-12-28 DIAGNOSIS — I481 Persistent atrial fibrillation: Secondary | ICD-10-CM

## 2014-12-28 DIAGNOSIS — Z5181 Encounter for therapeutic drug level monitoring: Secondary | ICD-10-CM

## 2014-12-28 DIAGNOSIS — R0602 Shortness of breath: Secondary | ICD-10-CM | POA: Diagnosis not present

## 2014-12-28 DIAGNOSIS — I2510 Atherosclerotic heart disease of native coronary artery without angina pectoris: Secondary | ICD-10-CM | POA: Insufficient documentation

## 2014-12-28 DIAGNOSIS — I10 Essential (primary) hypertension: Secondary | ICD-10-CM | POA: Insufficient documentation

## 2014-12-28 DIAGNOSIS — I4819 Other persistent atrial fibrillation: Secondary | ICD-10-CM

## 2014-12-28 DIAGNOSIS — I4891 Unspecified atrial fibrillation: Secondary | ICD-10-CM

## 2014-12-28 DIAGNOSIS — I480 Paroxysmal atrial fibrillation: Secondary | ICD-10-CM

## 2014-12-28 LAB — POCT INR: INR: 2.9

## 2014-12-28 NOTE — Patient Instructions (Signed)
Can take 1/2 tablet of metoprolol for hr >100 and bp >120 Low sodium diet

## 2014-12-28 NOTE — Progress Notes (Signed)
Patient ID: [name], male   DOB: 1950/09/25, 64 y.o.   MRN: [id number]        Date:  12/28/2014   ID:  [name], DOB 27-Dec-1950, MRN [id number]  PCP:  Wyatt Haste, MD  Cardiologist:  Dr Radford Pax Primary Electrophysiologist: Roderic Palau, NP    Chief Complaint  Patient presents with  . Atrial Fibrillation     History of Present Illness: [name] is a 64 y.o. male who presents today for f/u  Evaluation in the afib clinic. Since his recent hospital discharge from afib ablation, 5/10,  he has done well in [place] but did have afib Saturday. He did spontaneously convert to SR and has not noticed any more afib since. His BP is up and he admits he has gained 15 lbs over the last 6 months.    He denies CP. No issues with swallowing or groin issues since the procedure. Compliant with coumadin. Today, he denies symptoms of palpitations, chest pain, orthopnea, PND, lower extremity edema, claudication, dizziness, presyncope, syncope, bleeding, or neurologic sequela. The patient is tolerating medications without difficulties and is otherwise without complaint today.    Past Medical History  Diagnosis Date  . Cerebrovascular accident     a. 03/2008 secondary to cardioembolic event;  b. chronic coumadin.  . Central retinal artery occlusion, left eye     diagnosed in 2008  . Obstructive sleep apnea   . Morbid obesity   . Skin infection     chronic left leg herpetic  . Bell's palsy   . Diabetes mellitus 11/14/2010    TYPE II  . Obesity   . Chronic diastolic CHF (congestive heart failure)     a. 12/2008 Echo: nl EF.  [name] Hypogonadism, male 05/2013    Dr. Estill Dooms, Urology  . Hypertension   . Hyperlipidemia   . Persistent atrial fibrillation     a. s/p afib ablation by [facility] 08/01/08  . Atrial flutter     a. atypical atrial flutter.  [name] CAD (coronary artery disease)     a. 07/2009 Cath: LM nl, LAD nl, LCX nl w/ L->R collats, chronically occluded RCA s/p failed PCI;  b. 07/2012 MV: basal  inf mild ischemia.  . Recommendation refused by patient     multiple recommended vaccines refused over the years (flu/pneumovax)  . Hematuria 05/2013    Urology, Dr. Estill Dooms  . Atrial fibrillation    Past Surgical History  Procedure Laterality Date  . Atrial ablation surgery  08/01/08    CTI and PVI ablation by [facility]  . Colonoscopy      2012 per patient  . Electrophysiologic study N/A 11/30/2014    Procedure: Atrial Fibrillation Ablation;  Surgeon: [name], MD;  Location: [place];  Service: Cardiovascular;  Laterality: N/A;  . Tee without cardioversion N/A 11/30/2014    Procedure: TRANSESOPHAGEAL ECHOCARDIOGRAM (TEE);  Surgeon: Josue Hector, MD;  Location: [facility] ENDOSCOPY;  Service: Cardiovascular;  Laterality: N/A;     Current Outpatient Prescriptions  Medication Sig Dispense Refill  . atorvastatin (LIPITOR) 80 MG tablet TAKE ONE TABLET BY MOUTH ONCE DAILY (Patient taking differently: TAKE 80 MG BY MOUTH ONCE DAILY) 30 tablet 6  . b complex vitamins capsule Take 1 capsule by mouth daily.    . Cholecalciferol (VITAMIN D3) 10000 UNITS capsule Take 10,000 Units by mouth daily.     [name] glucose blood (ACCU-CHEK AVIVA PLUS) test strip 50 each by Other route as needed for other. Use as instructed 50  each 10  . isosorbide mononitrate (IMDUR) 30 MG 24 hr tablet TAKE ONE TABLET BY MOUTH ONCE DAILY (Patient taking differently: TAKE 30 MG BY MOUTH ONCE DAILY) 30 tablet 6  . metFORMIN (GLUCOPHAGE) 1000 MG tablet Take 1 tablet (1,000 mg total) by mouth 2 (two) times daily with a meal. Ok to resume 12/02/14 180 tablet 1  . metoprolol (LOPRESSOR) 50 MG tablet TAKE ONE TABLET BY MOUTH TWICE DAILY (Patient taking differently: TAKE 50 MG BY MOUTH TWICE DAILY) 60 tablet 6  . omega-3 acid ethyl esters (LOVAZA) 1 G capsule TAKE TWO CAPSULES BY MOUTH TWICE DAILY 120 capsule 0  . sotalol (BETAPACE) 160 MG tablet Take 1 tablet (160 mg total) by mouth 2 (two) times daily. 60 tablet 11  . Testosterone  (ANDROGEL PUMP) 1.25 GM/ACT (1%) GEL Place 1 application onto the skin daily.     . valACYclovir (VALTREX) 500 MG tablet Take 1 tablet (500 mg total) by mouth daily. 90 tablet 3  . valsartan (DIOVAN) 160 MG tablet Take 1 tablet (160 mg total) by mouth daily. 30 tablet 6  . warfarin (COUMADIN) 5 MG tablet Take as directed by Coumadin Clinic (Patient taking differently: Take 5-7.5 mg by mouth daily. Monday, Wednesday, Friday, Saturday, and Thursdays = 7.5mg      Sunday and Tuesday = 5mg ) 40 tablet 3  . ACCU-CHEK FASTCLIX LANCETS MISC USE TO TEST BLOOD SUGAR DAILY 102 each 0   No current facility-administered medications for this encounter.    Allergies:   Crestor; Zetia; and Lisinopril   Social History:  The patient  reports that he quit smoking about 35 years ago. He does not have any smokeless tobacco history on file. He reports that he does not drink alcohol or use illicit drugs.   Family History:  The patient's  family history includes Heart disease in his father and mother; Stroke in his father.    ROS:  Please see the history of present illness.   All other systems are reviewed and negative.    PHYSICAL EXAM: VS:  BP 150/98 mmHg  Pulse 75  Ht 5\' 10"  (1.778 m)  Wt 286 lb 12.8 oz (130.092 kg)  BMI 41.15 kg/m2 , BMI Body mass index is 41.15 kg/(m^2). BP rechecked by me 150 /100. GEN: Well nourished, well developed, in no acute distress HEENT: normal Neck: + JVD, carotid bruits, or masses Cardiac: RRR; no murmurs, rubs, or gallops,trace edema  Respiratory:  Few basilar rales, normal work of breathing GI: soft, nontender, nondistended, + BS MS: no deformity or atrophy Skin: warm and dry  Neuro:  Strength and sensation are intact, stable L facial droop Psych: euthymic mood, full affect  EKG:  EKG is ordered today. The ekg ordered today shows sinus rhythm, at 75 bpm.QTc 484 ms.   Recent Labs: 03/10/2014: ALT 26 11/30/2014: Hemoglobin 13.3; Platelets 189 12/01/2014: BUN 11;  Creatinine 0.88; Potassium 3.6; Sodium 138    Lipid Panel     Component Value Date/Time   CHOL 109 03/10/2014 1151   TRIG 126.0 03/10/2014 1151   HDL 32.60* 03/10/2014 1151   CHOLHDL 3 03/10/2014 1151   VLDL 25.2 03/10/2014 1151   LDLCALC 51 03/10/2014 1151   LDLDIRECT 87.7 05/08/2013 0837     Wt Readings from Last 3 Encounters:  12/17/14 286 lb 12.8 oz (130.092 kg)  12/01/14 285 lb 15 oz (129.7 kg)  11/18/14 286 lb (129.729 kg)      Other studies Reviewed: Additional studies/ records that were reviewed  today include: recent hospital records, prior echo    ASSESSMENT AND PLAN:  1.  Paroxysmal atrial fibrillation and atrial flutter The patient has had recurrent arrhythmias, most recently typical appearing atrial flutter despite medical therapy with tikosyn.  His chads2vasc score is at least 5.  He is appropriately anticoagulated. S/p afib ablation 5/10 and doing well with only one breakthrough afib episode since procedure. Pt instructed he can take extra 1/2 tab of metoprolol if needed for rapid PAF.  2.SOB/ CAD Stable  3. HTN Elevated Offered addttional BP med but pt deferred. He has had significant weight gain over the last few months, and promises to put forth effort to lose weight. Info re free dietary class given to pt. Avoid salt Increase exercise Use cpap  Current medicines are reviewed at length with the patient today.   The patient does not have concerns regarding his medicines.  The following changes were made today:  none  Labs/ tests ordered today include:  Orders Placed This Encounter  Procedures  . EKG 12-Lead   F/u with Dr. Rayann Heman as scheduled 8/16.  Eduard Roux, NP  12/28/2014 3:22 PM

## 2014-12-31 ENCOUNTER — Telehealth: Payer: Self-pay | Admitting: Internal Medicine

## 2014-12-31 MED ORDER — AMLODIPINE BESYLATE 5 MG PO TABS
5.0000 mg | ORAL_TABLET | Freq: Every day | ORAL | Status: DC
Start: 2014-12-31 — End: 2015-05-30

## 2014-12-31 NOTE — Telephone Encounter (Signed)
New message      Pt c/o BP issue: STAT if pt c/o blurred vision, one-sided weakness or slurred speech  1. What are your last 5 BP readings? 150/90, 175/99-----HR 65-72 2. Are you having any other symptoms (ex. Dizziness, headache, blurred vision, passed out)?  no  3. What is your BP issue? Pt had ablation 11-30-14.  bp has been high ever since ablation.  Please call

## 2014-12-31 NOTE — Telephone Encounter (Signed)
Discussed with Butch Penny who tried to start a BP medication on 6/7 while seeing in the afib clinic but he did not want to start then.  She recommended Amlodipine 5 mg daily and have BP checked mid week in nurse room.  I have left a message for the patient with the above instructions

## 2015-01-19 ENCOUNTER — Other Ambulatory Visit: Payer: Self-pay | Admitting: Family Medicine

## 2015-01-19 ENCOUNTER — Other Ambulatory Visit: Payer: Self-pay | Admitting: Cardiology

## 2015-01-19 ENCOUNTER — Other Ambulatory Visit: Payer: Self-pay | Admitting: Medical

## 2015-01-19 ENCOUNTER — Telehealth: Payer: Self-pay | Admitting: Medical

## 2015-01-19 MED ORDER — VITAMIN D3 250 MCG (10000 UT) PO CAPS: 10000.0000 [IU] | ORAL_CAPSULE | Freq: Every day | ORAL | Status: DC

## 2015-01-19 NOTE — Telephone Encounter (Signed)
Recv'd fax refill request for Accu-Chek Plus Test strips #50 from [facility]

## 2015-01-19 NOTE — Telephone Encounter (Signed)
REFILLS ON TEST STRIPS WAS SENT TO HIS PHARMACY

## 2015-01-25 ENCOUNTER — Telehealth: Payer: Self-pay | Admitting: Internal Medicine

## 2015-01-25 ENCOUNTER — Ambulatory Visit (INDEPENDENT_AMBULATORY_CARE_PROVIDER_SITE_OTHER): Payer: Medicaid Other | Admitting: *Deleted

## 2015-01-25 DIAGNOSIS — I481 Persistent atrial fibrillation: Secondary | ICD-10-CM | POA: Diagnosis not present

## 2015-01-25 DIAGNOSIS — I4891 Unspecified atrial fibrillation: Secondary | ICD-10-CM | POA: Diagnosis not present

## 2015-01-25 DIAGNOSIS — I4819 Other persistent atrial fibrillation: Secondary | ICD-10-CM

## 2015-01-25 DIAGNOSIS — Z5181 Encounter for therapeutic drug level monitoring: Secondary | ICD-10-CM | POA: Diagnosis not present

## 2015-01-25 DIAGNOSIS — I6350 Cerebral infarction due to unspecified occlusion or stenosis of unspecified cerebral artery: Secondary | ICD-10-CM

## 2015-01-25 DIAGNOSIS — I639 Cerebral infarction, unspecified: Secondary | ICD-10-CM

## 2015-01-25 LAB — POCT INR: INR: 2.9

## 2015-01-25 NOTE — Telephone Encounter (Signed)
This was done on 01/19/15

## 2015-01-25 NOTE — Telephone Encounter (Signed)
Refill request for accu0chek aviva plus test #50- once daily to [facility] [place], [place]

## 2015-02-03 ENCOUNTER — Telehealth: Payer: Self-pay | Admitting: Cardiology

## 2015-02-03 NOTE — Telephone Encounter (Signed)
Spoke with patient and confirmed that he should be okay with his hose. He stated that if he had any trouble with anything he would let me know

## 2015-02-03 NOTE — Telephone Encounter (Signed)
New message     Pt has new CPAP machine with a longer hose.  Pt wants to know if the longer hose will effect his intake. Please call to discuss

## 2015-03-07 ENCOUNTER — Ambulatory Visit (INDEPENDENT_AMBULATORY_CARE_PROVIDER_SITE_OTHER): Payer: Medicaid Other | Admitting: Internal Medicine

## 2015-03-07 ENCOUNTER — Encounter: Payer: Self-pay | Admitting: Internal Medicine

## 2015-03-07 ENCOUNTER — Ambulatory Visit (INDEPENDENT_AMBULATORY_CARE_PROVIDER_SITE_OTHER): Payer: Medicaid Other | Admitting: *Deleted

## 2015-03-07 VITALS — BP 130/80 | HR 70 | Ht 70.5 in | Wt 285.4 lb

## 2015-03-07 DIAGNOSIS — I4891 Unspecified atrial fibrillation: Secondary | ICD-10-CM

## 2015-03-07 DIAGNOSIS — I480 Paroxysmal atrial fibrillation: Secondary | ICD-10-CM | POA: Diagnosis not present

## 2015-03-07 DIAGNOSIS — I4819 Other persistent atrial fibrillation: Secondary | ICD-10-CM

## 2015-03-07 DIAGNOSIS — I639 Cerebral infarction, unspecified: Secondary | ICD-10-CM | POA: Diagnosis not present

## 2015-03-07 DIAGNOSIS — I6350 Cerebral infarction due to unspecified occlusion or stenosis of unspecified cerebral artery: Secondary | ICD-10-CM

## 2015-03-07 DIAGNOSIS — Z5181 Encounter for therapeutic drug level monitoring: Secondary | ICD-10-CM

## 2015-03-07 DIAGNOSIS — I481 Persistent atrial fibrillation: Secondary | ICD-10-CM | POA: Diagnosis not present

## 2015-03-07 LAB — POCT INR: INR: 3.2

## 2015-03-07 NOTE — Patient Instructions (Signed)
Medication Instructions:  Your physician has recommended you make the following change in your medication:  1) Stop Sotalol   Labwork: None ordered  Testing/Procedures: None ordered  Follow-Up: Your physician recommends that you schedule a follow-up appointment in: 3 months with Dr Rayann Heman   Any Other Special Instructions Will Be Listed Below (If Applicable).

## 2015-03-07 NOTE — Progress Notes (Signed)
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 male who presents today for routine electrophysiology followup.  Since his AF ablation, the patient reports doing very well.  He denies procedure related complications.  No symptoms of AF.  Today, he denies symptoms of palpitations, chest pain, shortness of breath,  lower extremity edema, dizziness, presyncope, or syncope.  The patient is otherwise without complaint today.   Past Medical History  Diagnosis Date  . Cerebrovascular accident     a. 03/2008 secondary to cardioembolic event;  b. chronic coumadin.  . Central retinal artery occlusion, left eye     diagnosed in 2008  . Obstructive sleep apnea   . Morbid obesity   . Skin infection     chronic left leg herpetic  . Bell's palsy   . Diabetes mellitus 11/14/2010    TYPE II  . Obesity   . Chronic diastolic CHF (congestive heart failure)     a. 12/2008 Echo: nl EF.  [name] Hypogonadism, male 05/2013    Dr. Estill Dooms, Urology  . Hypertension   . Hyperlipidemia   . Persistent atrial fibrillation     a. s/p afib ablation by [facility] 08/01/08  . Atrial flutter     a. atypical atrial flutter.  [name] CAD (coronary artery disease)     a. 07/2009 Cath: LM nl, LAD nl, LCX nl w/ L->R collats, chronically occluded RCA s/p failed PCI;  b. 07/2012 MV: basal inf mild ischemia.  . Recommendation refused by patient     multiple recommended vaccines refused over the years (flu/pneumovax)  . Hematuria 05/2013    Urology, Dr. Estill Dooms  . Atrial fibrillation    Past Surgical History  Procedure Laterality Date  . Atrial ablation surgery  08/01/08    CTI and PVI ablation by [facility]  . Colonoscopy      2012 per patient  . Electrophysiologic study N/A 11/30/2014    Procedure: Atrial Fibrillation Ablation;  Surgeon: [name], MD;  Location: [place] [facility];  Service: Cardiovascular;  Laterality: N/A;  . Tee without cardioversion N/A 11/30/2014    Procedure: TRANSESOPHAGEAL  ECHOCARDIOGRAM (TEE);  Surgeon: Josue Hector, MD;  Location: [facility] ENDOSCOPY;  Service: Cardiovascular;  Laterality: N/A;    ROS- all systems are reviewed and negatives except as per HPI above  Current Outpatient Prescriptions  Medication Sig Dispense Refill  . ACCU-CHEK FASTCLIX LANCETS MISC USE TO TEST BLOOD SUGAR DAILY 102 each 0  . amLODipine (NORVASC) 5 MG tablet Take 1 tablet (5 mg total) by mouth daily. 90 tablet 3  . atorvastatin (LIPITOR) 80 MG tablet TAKE ONE TABLET BY MOUTH ONCE DAILY 30 tablet 3  . b complex vitamins capsule Take 1 capsule by mouth daily.    . Cholecalciferol (VITAMIN D3) 10000 UNITS capsule Take 1 capsule (10,000 Units total) by mouth daily. 4 capsule 0  . glucose blood (ACCU-CHEK AVIVA PLUS) test strip 50 each by Other route as needed for other. Use as instructed 50 each 10  . isosorbide mononitrate (IMDUR) 30 MG 24 hr tablet TAKE ONE TABLET BY MOUTH ONCE DAILY 30 tablet 3  . metFORMIN (GLUCOPHAGE) 1000 MG tablet Take 1 tablet (1,000 mg total) by mouth 2 (two) times daily with a meal. Ok to resume 12/02/14 180 tablet 1  . metoprolol (LOPRESSOR) 50 MG tablet TAKE ONE TABLET BY MOUTH TWICE DAILY 60 tablet 3  . omega-3 acid ethyl esters (LOVAZA) 1 G capsule TAKE TWO CAPSULES BY  MOUTH TWICE DAILY 120 capsule 0  . Testosterone (ANDROGEL PUMP) 1.25 GM/ACT (1%) GEL Place 1 application onto the skin daily.     . valACYclovir (VALTREX) 500 MG tablet Take 1 tablet (500 mg total) by mouth daily. 90 tablet 3  . valsartan (DIOVAN) 160 MG tablet Take 1 tablet (160 mg total) by mouth daily. 30 tablet 6  . warfarin (COUMADIN) 5 MG tablet TAKE AS DIRECTED BY COUMADIN CLINIC 40 tablet 3   No current facility-administered medications for this visit.    Physical Exam: Filed Vitals:   03/07/15 1401  BP: 130/80  Pulse: 70  Height: 5' 10.5" (1.791 m)  Weight: 129.457 kg (285 lb 6.4 oz)    GEN- The patient is well appearing, alert and oriented x 3 today.   Head- normocephalic,  atraumatic Eyes-  Sclera clear, conjunctiva pink Ears- hearing intact Oropharynx- clear Lungs- Clear to ausculation bilaterally, normal work of breathing Heart- Regular rate and rhythm, no murmurs, rubs or gallops, PMI not laterally displaced GI- soft, NT, ND, + BS Extremities- no clubbing, cyanosis, or edema  ekg today reveals sinus rhythm  Assessment and Plan:   1. Paroxysmal atrial fibrillation and atrial flutter Doing well s/p ablation Stop tikosyn No other changes  2. Obesity Weight loss encouraged  Return to see me in 3 months Follow-up with Dr Radford Pax as scheduled

## 2015-03-14 ENCOUNTER — Telehealth: Payer: Self-pay

## 2015-03-14 NOTE — Telephone Encounter (Signed)
His Urologist, [facility], called and said he was out of referrals and needs one for his last appt and his upcoming appt. His previous appt was 03/08/15 and his future appt is 04/28/15.

## 2015-03-14 NOTE — Telephone Encounter (Signed)
Due for follow up here. So make appt for med check here, and we can process referral at that time

## 2015-03-15 ENCOUNTER — Ambulatory Visit (INDEPENDENT_AMBULATORY_CARE_PROVIDER_SITE_OTHER): Payer: Medicaid Other | Admitting: Medical

## 2015-03-15 ENCOUNTER — Encounter: Payer: Self-pay | Admitting: Medical

## 2015-03-15 ENCOUNTER — Telehealth: Payer: Self-pay | Admitting: Medical

## 2015-03-15 VITALS — BP 140/90 | HR 68 | Wt 288.6 lb

## 2015-03-15 DIAGNOSIS — I10 Essential (primary) hypertension: Secondary | ICD-10-CM

## 2015-03-15 DIAGNOSIS — G4733 Obstructive sleep apnea (adult) (pediatric): Secondary | ICD-10-CM

## 2015-03-15 DIAGNOSIS — E291 Testicular hypofunction: Secondary | ICD-10-CM | POA: Diagnosis not present

## 2015-03-15 DIAGNOSIS — E669 Obesity, unspecified: Secondary | ICD-10-CM | POA: Diagnosis not present

## 2015-03-15 DIAGNOSIS — H9193 Unspecified hearing loss, bilateral: Secondary | ICD-10-CM | POA: Diagnosis not present

## 2015-03-15 DIAGNOSIS — E119 Type 2 diabetes mellitus without complications: Secondary | ICD-10-CM

## 2015-03-15 DIAGNOSIS — Z2820 Immunization not carried out because of patient decision for unspecified reason: Secondary | ICD-10-CM

## 2015-03-15 NOTE — Telephone Encounter (Signed)
Called pt and left a message to CB and schedule an appt.

## 2015-03-15 NOTE — Telephone Encounter (Signed)
Pt called back and he is scheduled to come in this afternoon at 2:15

## 2015-03-15 NOTE — Progress Notes (Signed)
   Subjective: Chief Complaint  Patient presents with  . med check    med check- testosterone is low.   Here for f/u on diabetes.  He notes that he is working on eating health, exercising some, but does eat some stuff he shouldn't like the recent several donuts in one sitting.  Checking feet, compliant with metformin.  Cardiology manages his BP, cholesterol and he is due for fasting labs for lipid and liver eval.  Plans to do this at cardiology  Wants to pursue hearing eval now  [facility] urology regarding testosterone therapy and prostate eval.   Past Medical History  Diagnosis Date  . Cerebrovascular accident     a. 03/2008 secondary to cardioembolic event;  b. chronic coumadin.  . Central retinal artery occlusion, left eye     diagnosed in 2008  . Obstructive sleep apnea   . Morbid obesity   . Skin infection     chronic left leg herpetic  . Bell's palsy   . Diabetes mellitus 11/14/2010    TYPE II  . Obesity   . Chronic diastolic CHF (congestive heart failure)     a. 12/2008 Echo: nl EF.  [name] Hypogonadism, male 05/2013    Dr. Estill Dooms, Urology  . Hypertension   . Hyperlipidemia   . Persistent atrial fibrillation     a. s/p afib ablation by [facility] 08/01/08  . Atrial flutter     a. atypical atrial flutter.  [name] CAD (coronary artery disease)     a. 07/2009 Cath: LM nl, LAD nl, LCX nl w/ L->R collats, chronically occluded RCA s/p failed PCI;  b. 07/2012 MV: basal inf mild ischemia.  . Recommendation refused by patient     multiple recommended vaccines refused over the years (flu/pneumovax)  . Hematuria 05/2013    Urology, Dr. Estill Dooms  . Atrial fibrillation    ROS as in subjective    Objective: BP 140/90 mmHg  Pulse 68  Wt 288 lb 9.6 oz (130.908 kg)  Gen: wd, wn, nad Heart RRR, normal s1, s2, no murmurs Lungs clear Pulses normal Ext: no edema Abdomen: obese, reducible nontender umbilical hernia, no mass, no organomegaly    Assessment: Encounter Diagnoses  Name Primary?  .  Diabetes type 2, controlled Yes  . Obesity   . OSA (obstructive sleep apnea)   . Essential hypertension   . Hypogonadism in male   . Vaccine refused by patient   . Hearing impaired, bilateral     Plan: Diabetes type 2 - hgba1C at goal.  C/t daily foot checks, yearly eye exam, diabetic diet, Metformin BID.   Offered different medication such as Wilder Glade or Vicotza to see if we could improve on weight loss.  He will stick with Metformin for now. Obesity - he states he is eating relatively healthy and exercising, but his weight is not improving, and he does endorse some recent donut consumption, and overall he hasn't been all that motivated at weight loss.   Encouraged him to take a serious effort in losing weight OSA - managed by cardiology HTN - managed by cardiology Hypogonadisms - managed by urology, referral back to urology He continues to refuse vaccines despite counseling on the benefits Hearing impaired - referral to audiology

## 2015-03-15 NOTE — Telephone Encounter (Signed)
Refer back to Dr. Estill Dooms at [facility].  He already has f/u appt, just needs the official paperwork.  Refer to audiology for hearing evaluation, possible need for hearing aids.

## 2015-03-16 ENCOUNTER — Telehealth: Payer: Self-pay | Admitting: Medical

## 2015-03-16 ENCOUNTER — Encounter: Payer: Self-pay | Admitting: Internal Medicine

## 2015-03-16 ENCOUNTER — Other Ambulatory Visit: Payer: Self-pay | Admitting: Medical

## 2015-03-16 MED ORDER — BENZONATATE 200 MG PO CAPS
200.0000 mg | ORAL_CAPSULE | Freq: Three times a day (TID) | ORAL | Status: DC | PRN
Start: 2015-03-16 — End: 2015-05-30

## 2015-03-16 NOTE — Telephone Encounter (Signed)
[facility] and spoke with Bethena Roys and gave authorization for pt to be seen there from his last visit and upcoming visit.   Pt also has been scheduled to go have a Hearing Evaluation on September 28th @ 2:00pm with Dr. Simeon Craft @ [facility]. Arriving @ 1:40pm to get checked in. Address to [facility]. [address] [place], [place], [place] [address]. Phone #  [phone].   Pt is aware about [facility] as well as hearing evaluation. He asked that the hearing appt be mailed to him for a reminder.   I have faxed over notes to [facility] @ [phone].

## 2015-03-16 NOTE — Telephone Encounter (Signed)
Pt states at his appt yesterday he mentioned to you about this cough he has had. He states that it is even worse today especially when he gets hot. Pt uses [facility] in [place] and can be reached at [phone].

## 2015-03-16 NOTE — Telephone Encounter (Signed)
Med sent.

## 2015-03-17 ENCOUNTER — Other Ambulatory Visit (INDEPENDENT_AMBULATORY_CARE_PROVIDER_SITE_OTHER): Payer: Medicaid Other | Admitting: *Deleted

## 2015-03-17 DIAGNOSIS — E782 Mixed hyperlipidemia: Secondary | ICD-10-CM

## 2015-03-17 LAB — LIPID PANEL
Cholesterol: 142 mg/dL (ref 0–200)
HDL: 36.7 mg/dL — ABNORMAL LOW (ref >39.00)
LDL Cholesterol: 69 mg/dL (ref 0–99)
NonHDL: 105.05
Total CHOL/HDL Ratio: 4
Triglycerides: 182 mg/dL — ABNORMAL HIGH (ref 0.0–149.0)
VLDL: 36.4 mg/dL (ref 0.0–40.0)

## 2015-03-17 LAB — HEPATIC FUNCTION PANEL
ALT: 23 U/L (ref 0–53)
AST: 20 U/L (ref 0–37)
Albumin: 4.1 g/dL (ref 3.5–5.2)
Alkaline Phosphatase: 55 U/L (ref 39–117)
Bilirubin, Direct: 0.1 mg/dL (ref 0.0–0.3)
Total Bilirubin: 0.5 mg/dL (ref 0.2–1.2)
Total Protein: 7.1 g/dL (ref 6.0–8.3)

## 2015-03-23 ENCOUNTER — Other Ambulatory Visit: Payer: Self-pay | Admitting: Medical

## 2015-03-23 NOTE — Telephone Encounter (Signed)
This was a refill request-looks like he saw you last week and in your note you states that cards handles his BP and chol-had lipid panel 03/17/15 (in system) looks like with cardiology. Should I refuse refill and have him get through them or did you want to fill it? Please advise. Thanks.

## 2015-04-07 ENCOUNTER — Ambulatory Visit (INDEPENDENT_AMBULATORY_CARE_PROVIDER_SITE_OTHER): Payer: Medicaid Other | Admitting: *Deleted

## 2015-04-07 DIAGNOSIS — I4891 Unspecified atrial fibrillation: Secondary | ICD-10-CM | POA: Diagnosis not present

## 2015-04-07 DIAGNOSIS — Z5181 Encounter for therapeutic drug level monitoring: Secondary | ICD-10-CM | POA: Diagnosis not present

## 2015-04-07 DIAGNOSIS — I639 Cerebral infarction, unspecified: Secondary | ICD-10-CM

## 2015-04-07 DIAGNOSIS — I480 Paroxysmal atrial fibrillation: Secondary | ICD-10-CM | POA: Diagnosis not present

## 2015-04-07 DIAGNOSIS — I6350 Cerebral infarction due to unspecified occlusion or stenosis of unspecified cerebral artery: Secondary | ICD-10-CM

## 2015-04-07 LAB — POCT INR: INR: 3

## 2015-04-26 ENCOUNTER — Ambulatory Visit (INDEPENDENT_AMBULATORY_CARE_PROVIDER_SITE_OTHER): Payer: Medicaid Other | Admitting: Ophthalmology

## 2015-04-26 DIAGNOSIS — H35033 Hypertensive retinopathy, bilateral: Secondary | ICD-10-CM

## 2015-04-26 DIAGNOSIS — H353111 Nonexudative age-related macular degeneration, right eye, early dry stage: Secondary | ICD-10-CM | POA: Diagnosis not present

## 2015-04-26 DIAGNOSIS — H2513 Age-related nuclear cataract, bilateral: Secondary | ICD-10-CM

## 2015-04-26 DIAGNOSIS — I10 Essential (primary) hypertension: Secondary | ICD-10-CM | POA: Diagnosis not present

## 2015-04-26 DIAGNOSIS — H43813 Vitreous degeneration, bilateral: Secondary | ICD-10-CM | POA: Diagnosis not present

## 2015-04-26 DIAGNOSIS — H34812 Central retinal vein occlusion, left eye, with macular edema: Secondary | ICD-10-CM

## 2015-05-05 ENCOUNTER — Ambulatory Visit (INDEPENDENT_AMBULATORY_CARE_PROVIDER_SITE_OTHER): Payer: Medicaid Other | Admitting: *Deleted

## 2015-05-05 DIAGNOSIS — I6350 Cerebral infarction due to unspecified occlusion or stenosis of unspecified cerebral artery: Secondary | ICD-10-CM | POA: Diagnosis not present

## 2015-05-05 DIAGNOSIS — Z5181 Encounter for therapeutic drug level monitoring: Secondary | ICD-10-CM | POA: Diagnosis not present

## 2015-05-05 DIAGNOSIS — I480 Paroxysmal atrial fibrillation: Secondary | ICD-10-CM | POA: Diagnosis not present

## 2015-05-05 DIAGNOSIS — I4891 Unspecified atrial fibrillation: Secondary | ICD-10-CM

## 2015-05-05 LAB — POCT INR: INR: 2.6

## 2015-05-21 ENCOUNTER — Other Ambulatory Visit: Payer: Self-pay | Admitting: Cardiology

## 2015-05-23 ENCOUNTER — Telehealth: Payer: Self-pay | Admitting: Cardiology

## 2015-05-23 ENCOUNTER — Other Ambulatory Visit: Payer: Self-pay

## 2015-05-23 MED ORDER — WARFARIN SODIUM 5 MG PO TABS: ORAL_TABLET | ORAL | Status: DC

## 2015-05-23 NOTE — Telephone Encounter (Signed)
New message      STAT if patient is at the pharmacy , call can be transferred to refill team.   1. Which medications need to be refilled? Warfarin 5 mg   2. Which pharmacy/location is medication to be sent to? [facility] in [place]  - patient does not have the phone number   3. Do they need a 30 day or 90 day supply? 40 pills is given each month- patient is requesting  45 pills not to run out.

## 2015-05-24 ENCOUNTER — Other Ambulatory Visit: Payer: Self-pay | Admitting: Cardiology

## 2015-05-24 ENCOUNTER — Other Ambulatory Visit: Payer: Self-pay | Admitting: Medical

## 2015-05-30 ENCOUNTER — Ambulatory Visit (INDEPENDENT_AMBULATORY_CARE_PROVIDER_SITE_OTHER): Payer: Medicaid Other | Admitting: Pharmacist

## 2015-05-30 ENCOUNTER — Ambulatory Visit (INDEPENDENT_AMBULATORY_CARE_PROVIDER_SITE_OTHER): Payer: Medicaid Other | Admitting: Internal Medicine

## 2015-05-30 ENCOUNTER — Encounter: Payer: Self-pay | Admitting: Internal Medicine

## 2015-05-30 VITALS — BP 160/82 | HR 80 | Ht 70.5 in | Wt 291.0 lb

## 2015-05-30 DIAGNOSIS — I4891 Unspecified atrial fibrillation: Secondary | ICD-10-CM

## 2015-05-30 DIAGNOSIS — I480 Paroxysmal atrial fibrillation: Secondary | ICD-10-CM | POA: Diagnosis not present

## 2015-05-30 DIAGNOSIS — Z5181 Encounter for therapeutic drug level monitoring: Secondary | ICD-10-CM

## 2015-05-30 DIAGNOSIS — I6350 Cerebral infarction due to unspecified occlusion or stenosis of unspecified cerebral artery: Secondary | ICD-10-CM | POA: Diagnosis not present

## 2015-05-30 DIAGNOSIS — I10 Essential (primary) hypertension: Secondary | ICD-10-CM | POA: Diagnosis not present

## 2015-05-30 LAB — POCT INR: INR: 2.3

## 2015-05-30 MED ORDER — AMLODIPINE BESYLATE 10 MG PO TABS: 10.0000 mg | ORAL_TABLET | Freq: Every day | ORAL | Status: DC

## 2015-05-30 NOTE — Patient Instructions (Addendum)
Medication Instructions:  1) Increase Norvasc to 10 mg by mouth daily  Labwork: N/A  Testing/Procedures: N/A  Follow-Up: Your physician wants you to follow-up in: 6 months with Dr. Rayann Heman.    Any Other Special Instructions Will Be Listed Below (If Applicable).     If you need a refill on your cardiac medications before your next appointment, please call your pharmacy.

## 2015-05-30 NOTE — Progress Notes (Signed)
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 male who presents today for routine electrophysiology followup.  Since his last visit, the patient reports doing very well.   No symptoms of AF.  Today, he denies symptoms of palpitations, chest pain, shortness of breath,  lower extremity edema, dizziness, presyncope, or syncope.  The patient is otherwise without complaint today.   Past Medical History  Diagnosis Date  . Cerebrovascular accident [facility])     a. 03/2008 secondary to cardioembolic event;  b. chronic coumadin.  . Central retinal artery occlusion, left eye     diagnosed in 2008  . Obstructive sleep apnea   . Morbid obesity ([place])   . Skin infection     chronic left leg herpetic  . Bell's palsy   . Diabetes mellitus 11/14/2010    TYPE II  . Obesity   . Chronic diastolic CHF (congestive heart failure) ([place])     a. 12/2008 Echo: nl EF.  [name] Hypogonadism, male 05/2013    Dr. Estill Dooms, Urology  . Hypertension   . Hyperlipidemia   . Persistent atrial fibrillation ([place])     a. s/p afib ablation by [facility] 08/01/08  . Atrial flutter ([place])     a. atypical atrial flutter.  [name] CAD (coronary artery disease)     a. 07/2009 Cath: LM nl, LAD nl, LCX nl w/ L->R collats, chronically occluded RCA s/p failed PCI;  b. 07/2012 MV: basal inf mild ischemia.  . Recommendation refused by patient     multiple recommended vaccines refused over the years (flu/pneumovax)  . Hematuria 05/2013    Urology, Dr. Estill Dooms  . Atrial fibrillation [facility])    Past Surgical History  Procedure Laterality Date  . Atrial ablation surgery  08/01/08    CTI and PVI ablation by JA  . Colonoscopy      2012 per patient  . Electrophysiologic study N/A 11/30/2014    Procedure: Atrial Fibrillation Ablation;  Surgeon: [name], MD;  Location: [place] CV LAB;  Service: Cardiovascular;  Laterality: N/A;  . Tee without cardioversion N/A 11/30/2014    Procedure: TRANSESOPHAGEAL ECHOCARDIOGRAM  (TEE);  Surgeon: Josue Hector, MD;  Location: [facility] ENDOSCOPY;  Service: Cardiovascular;  Laterality: N/A;    ROS- all systems are reviewed and negatives except as per HPI above  Current Outpatient Prescriptions  Medication Sig Dispense Refill  . ACCU-CHEK FASTCLIX LANCETS MISC USE TO TEST BLOOD SUGAR DAILY 102 each 0  . amLODipine (NORVASC) 5 MG tablet Take 1 tablet (5 mg total) by mouth daily. 90 tablet 3  . atorvastatin (LIPITOR) 80 MG tablet TAKE ONE TABLET BY MOUTH ONCE DAILY 30 tablet 9  . b complex vitamins capsule Take 1 capsule by mouth daily.    . Cholecalciferol (VITAMIN D3) 10000 UNITS capsule Take 1 capsule (10,000 Units total) by mouth daily. 4 capsule 0  . glucose blood (ACCU-CHEK AVIVA PLUS) test strip 50 each by Other route as needed for other. Use as instructed 50 each 10  . isosorbide mononitrate (IMDUR) 30 MG 24 hr tablet TAKE ONE TABLET BY MOUTH ONCE DAILY 30 tablet 9  . metFORMIN (GLUCOPHAGE) 1000 MG tablet Take 1 tablet (1,000 mg total) by mouth 2 (two) times daily with a meal. Ok to resume 12/02/14 180 tablet 1  . metoprolol (LOPRESSOR) 50 MG tablet TAKE ONE TABLET BY MOUTH TWICE DAILY 60 tablet 9  . omega-3 acid ethyl esters (LOVAZA) 1 G capsule Take 2 capsules (  2 g total) by mouth 2 (two) times daily. 120 capsule 3  . Testosterone (ANDROGEL PUMP) 1.25 GM/ACT (1%) GEL Place 2 application onto the skin daily.     . valACYclovir (VALTREX) 500 MG tablet Take 1 tablet (500 mg total) by mouth daily. 90 tablet 3  . valsartan (DIOVAN) 160 MG tablet Take 1 tablet (160 mg total) by mouth daily. 30 tablet 6  . warfarin (COUMADIN) 5 MG tablet TAKE AS DIRECTED BY COUMADIN CLINIC 45 tablet 3   No current facility-administered medications for this visit.    Physical Exam: Filed Vitals:   05/30/15 1412  BP: 160/82  Pulse: 80  Height: 5' 10.5" (1.791 m)  Weight: 291 lb (131.997 kg)    GEN- The patient is well appearing, alert and oriented x 3 today.   Head- normocephalic,  atraumatic Eyes-  Sclera clear, conjunctiva pink Ears- hearing intact Oropharynx- clear Lungs- Clear to ausculation bilaterally, normal work of breathing Heart- Regular rate and rhythm, no murmurs, rubs or gallops, PMI not laterally displaced GI- soft, NT, ND, + BS Extremities- no clubbing, cyanosis, or edema  ekg today reveals sinus rhythm  Assessment and Plan:   1. Paroxysmal atrial fibrillation and atrial flutter Doing well s/p ablation off AAD therapy Continue long term anticoagulation  2. Obesity Weight loss encouraged  3. HTN Above goal Repeat by MD is 146/80 Increase norvasc to 10mg  daily  Return to see me in 6 months  [name] MD, [facility] 05/30/2015 2:33 PM

## 2015-06-23 ENCOUNTER — Other Ambulatory Visit: Payer: Self-pay | Admitting: Nurse Practitioner

## 2015-06-23 ENCOUNTER — Other Ambulatory Visit: Payer: Self-pay | Admitting: Medical

## 2015-06-23 ENCOUNTER — Other Ambulatory Visit: Payer: Self-pay | Admitting: Cardiology

## 2015-06-23 NOTE — Telephone Encounter (Signed)
Is this ok to refill?  

## 2015-07-11 ENCOUNTER — Ambulatory Visit (INDEPENDENT_AMBULATORY_CARE_PROVIDER_SITE_OTHER): Payer: Medicaid Other

## 2015-07-11 ENCOUNTER — Telehealth: Payer: Self-pay | Admitting: Medical

## 2015-07-11 DIAGNOSIS — Z5181 Encounter for therapeutic drug level monitoring: Secondary | ICD-10-CM | POA: Diagnosis not present

## 2015-07-11 DIAGNOSIS — I4891 Unspecified atrial fibrillation: Secondary | ICD-10-CM | POA: Diagnosis not present

## 2015-07-11 DIAGNOSIS — I480 Paroxysmal atrial fibrillation: Secondary | ICD-10-CM | POA: Diagnosis not present

## 2015-07-11 DIAGNOSIS — I6350 Cerebral infarction due to unspecified occlusion or stenosis of unspecified cerebral artery: Secondary | ICD-10-CM

## 2015-07-11 LAB — POCT INR: INR: 2.8

## 2015-07-11 NOTE — Telephone Encounter (Signed)
P.A. OMEGA 3  °

## 2015-07-27 ENCOUNTER — Encounter: Payer: Self-pay | Admitting: Cardiology

## 2015-07-27 ENCOUNTER — Ambulatory Visit (INDEPENDENT_AMBULATORY_CARE_PROVIDER_SITE_OTHER): Payer: Medicaid Other | Admitting: Cardiology

## 2015-07-27 VITALS — BP 140/80 | HR 80 | Ht 70.0 in | Wt 279.0 lb

## 2015-07-27 DIAGNOSIS — I2510 Atherosclerotic heart disease of native coronary artery without angina pectoris: Secondary | ICD-10-CM | POA: Diagnosis not present

## 2015-07-27 DIAGNOSIS — I10 Essential (primary) hypertension: Secondary | ICD-10-CM

## 2015-07-27 DIAGNOSIS — I483 Typical atrial flutter: Secondary | ICD-10-CM

## 2015-07-27 DIAGNOSIS — I2583 Coronary atherosclerosis due to lipid rich plaque: Principal | ICD-10-CM

## 2015-07-27 DIAGNOSIS — G4733 Obstructive sleep apnea (adult) (pediatric): Secondary | ICD-10-CM

## 2015-07-27 DIAGNOSIS — I4891 Unspecified atrial fibrillation: Secondary | ICD-10-CM | POA: Diagnosis not present

## 2015-07-27 DIAGNOSIS — E782 Mixed hyperlipidemia: Secondary | ICD-10-CM

## 2015-07-27 LAB — BASIC METABOLIC PANEL
BUN: 15 mg/dL (ref 7–25)
CO2: 22 mmol/L (ref 20–31)
Calcium: 8.9 mg/dL (ref 8.6–10.3)
Chloride: 101 mmol/L (ref 98–110)
Creat: 0.89 mg/dL (ref 0.70–1.25)
Glucose, Bld: 182 mg/dL — ABNORMAL HIGH (ref 65–99)
Potassium: 4.2 mmol/L (ref 3.5–5.3)
Sodium: 137 mmol/L (ref 135–146)

## 2015-07-27 LAB — LIPID PANEL
Cholesterol: 135 mg/dL (ref 125–200)
HDL: 36 mg/dL — ABNORMAL LOW (ref >=40)
LDL Cholesterol: 69 mg/dL (ref <130)
Total CHOL/HDL Ratio: 3.8 Ratio (ref <=5.0)
Triglycerides: 149 mg/dL (ref <150)
VLDL: 30 mg/dL (ref <30)

## 2015-07-27 LAB — HEPATIC FUNCTION PANEL
ALT: 25 U/L (ref 9–46)
AST: 22 U/L (ref 10–35)
Albumin: 4.2 g/dL (ref 3.6–5.1)
Alkaline Phosphatase: 65 U/L (ref 40–115)
Bilirubin, Direct: 0.1 mg/dL (ref <=0.2)
Indirect Bilirubin: 0.4 mg/dL (ref 0.2–1.2)
Total Bilirubin: 0.5 mg/dL (ref 0.2–1.2)
Total Protein: 7.2 g/dL (ref 6.1–8.1)

## 2015-07-27 NOTE — Addendum Note (Signed)
Addended by: Eulis Foster on: 07/27/2015 10:07 AM   Modules accepted: Orders

## 2015-07-27 NOTE — Patient Instructions (Signed)
Medication Instructions:  Your physician recommends that you continue on your current medications as directed. Please refer to the Current Medication list given to you today.   Labwork: TODAY: BMET, LFTs, Lipids  Testing/Procedures: None  Follow-Up: Your physician wants you to follow-up in: 6 months with Dr. Turner. You will receive a reminder letter in the mail two months in advance. If you don't receive a letter, please call our office to schedule the follow-up appointment.   Any Other Special Instructions Will Be Listed Below (If Applicable).     If you need a refill on your cardiac medications before your next appointment, please call your pharmacy.   

## 2015-07-27 NOTE — Progress Notes (Signed)
Cardiology Office Note   Date:  07/27/2015   ID:  [name], DOB 19-Dec-1950, MRN [id number]  PCP:  Wyatt Haste, MD    Chief Complaint  Patient presents with  . Coronary Artery Disease  . Hypertension  . Hyperlipidemia      History of Present Illness: [name] is a 65 y.o. male with a history of ASCAD, HTN, OSA, morbid obesity, PAF, dyslipidemia who presents today for followup. He is doing well.He is s/p afib ablation by Dr. Rayann Heman but unable to ablate atypical atrial flutter. He denies any chest pain, LE edema, dizziness, palpitations or syncope. He occasionally has some DOE with extreme exertion. He tolerates his CPAP well. He tolerates the pressure and mask without problems. He feels rested when he gets up and has no daytime sleepiness. He has lost 12 lbs.      Past Medical History  Diagnosis Date  . Cerebrovascular accident [facility])     a. 03/2008 secondary to cardioembolic event;  b. chronic coumadin.  . Central retinal artery occlusion, left eye     diagnosed in 2008  . Obstructive sleep apnea   . Morbid obesity ([place])   . Skin infection     chronic left leg herpetic  . Bell's palsy   . Diabetes mellitus 11/14/2010    TYPE II  . Obesity   . Chronic diastolic CHF (congestive heart failure) ([place])     a. 12/2008 Echo: nl EF.  [name] Hypogonadism, male 05/2013    Dr. Estill Dooms, Urology  . Hypertension   . Hyperlipidemia   . Persistent atrial fibrillation ([place])     a. s/p afib ablation by [facility] 08/01/08  . Atrial flutter ([place])     a. atypical atrial flutter.  [name] CAD (coronary artery disease)     a. 07/2009 Cath: LM nl, LAD nl, LCX nl w/ L->R collats, chronically occluded RCA s/p failed PCI;  b. 07/2012 MV: basal inf mild ischemia.  . Recommendation refused by patient     multiple recommended vaccines refused over the years (flu/pneumovax)  . Hematuria 05/2013    Urology, Dr. Estill Dooms  . Atrial fibrillation [facility])     Past Surgical History    Procedure Laterality Date  . Atrial ablation surgery  08/01/08    CTI and PVI ablation by JA  . Colonoscopy      2012 per patient  . Electrophysiologic study N/A 11/30/2014    Procedure: Atrial Fibrillation Ablation;  Surgeon: [name], MD;  Location: [place] CV LAB;  Service: Cardiovascular;  Laterality: N/A;  . Tee without cardioversion N/A 11/30/2014    Procedure: TRANSESOPHAGEAL ECHOCARDIOGRAM (TEE);  Surgeon: Josue Hector, MD;  Location: [facility] ENDOSCOPY;  Service: Cardiovascular;  Laterality: N/A;     Current Outpatient Prescriptions  Medication Sig Dispense Refill  . amLODipine (NORVASC) 10 MG tablet Take 1 tablet (10 mg total) by mouth daily. 90 tablet 3  . atorvastatin (LIPITOR) 80 MG tablet TAKE ONE TABLET BY MOUTH ONCE DAILY 30 tablet 9  . b complex vitamins capsule Take 1 capsule by mouth daily.    . Cholecalciferol (VITAMIN D3) 10000 UNITS capsule Take 1 capsule (10,000 Units total) by mouth daily. 4 capsule 0  . DIOVAN 160 MG tablet TAKE ONE TABLET BY MOUTH ONCE DAILY 90 tablet 3  . isosorbide mononitrate (IMDUR) 30 MG 24 hr tablet TAKE ONE TABLET BY MOUTH ONCE DAILY 30 tablet  9  . metFORMIN (GLUCOPHAGE) 1000 MG tablet TAKE ONE TABLET BY MOUTH TWICE DAILY WITH A MEAL 180 tablet 3  . omega-3 acid ethyl esters (LOVAZA) 1 G capsule Take 2 capsules (2 g total) by mouth 2 (two) times daily. 120 capsule 3  . Testosterone (ANDROGEL PUMP) 1.25 GM/ACT (1%) GEL Place 2 application onto the skin daily.     . valACYclovir (VALTREX) 500 MG tablet TAKE ONE CAPLET BY MOUTH ONCE DAILY 90 tablet 0  . warfarin (COUMADIN) 5 MG tablet TAKE AS DIRECTED BY COUMADIN CLINIC 45 tablet 3  . ACCU-CHEK FASTCLIX LANCETS MISC USE TO TEST BLOOD SUGAR DAILY 102 each 0  . glucose blood (ACCU-CHEK AVIVA PLUS) test strip 50 each by Other route as needed for other. Use as instructed 50 each 10  . metoprolol (LOPRESSOR) 50 MG tablet TAKE ONE TABLET BY MOUTH TWICE DAILY 60 tablet 9   No current  facility-administered medications for this visit.    Allergies:   Crestor; Zetia; and Lisinopril    Social History:  The patient  reports that he quit smoking about 36 years ago. He does not have any smokeless tobacco history on file. He reports that he does not drink alcohol or use illicit drugs.   Family History:  The patient's family history includes Heart disease in his father and mother; Stroke in his father.    ROS:  Please see the history of present illness.   Otherwise, review of systems are positive for none.   All other systems are reviewed and negative.    PHYSICAL EXAM: VS:  BP 140/80 mmHg  Pulse 80  Ht 5\' 10"  (1.778 m)  Wt 279 lb (126.554 kg)  BMI 40.03 kg/m2 , BMI Body mass index is 40.03 kg/(m^2). GEN: Well nourished, well developed, in no acute distress HEENT: normal Neck: no JVD, carotid bruits, or masses Cardiac: RRR; no murmurs, rubs, or gallops,no edema  Respiratory:  clear to auscultation bilaterally, normal work of breathing GI: soft, nontender, nondistended, + BS MS: no deformity or atrophy Skin: warm and dry, no rash Neuro:  Strength and sensation are intact Psych: euthymic mood, full affect   EKG:  EKG is not ordered today.    Recent Labs: 11/30/2014: Hemoglobin 13.3; Platelets 189 12/01/2014: BUN 11; Creatinine, Ser 0.88; Potassium 3.6; Sodium 138 03/17/2015: ALT 23    Lipid Panel    Component Value Date/Time   CHOL 142 03/17/2015 1105   TRIG 182.0* 03/17/2015 1105   HDL 36.70* 03/17/2015 1105   CHOLHDL 4 03/17/2015 1105   VLDL 36.4 03/17/2015 1105   LDLCALC 69 03/17/2015 1105   LDLDIRECT 87.7 05/08/2013 0837      Wt Readings from Last 3 Encounters:  07/27/15 279 lb (126.554 kg)  05/30/15 291 lb (131.997 kg)  03/15/15 288 lb 9.6 oz (130.908 kg)     ASSESSMENT AND PLAN:  1. ASCAD with chronically occluded RCA with left to right collaterals s/p failed PCI with no angina - continue Imdu/BB  - No ASA since  he is on Coumadin 2. HTN - controlled - continue Diovan/Lopressor/amlodipine 3. Dyslipidemia - continue fish oil/atorvastatin - check FLP and ALT 4. OSA on CPAP - I will get a d/l from his DME 5. Obesity - I encouraged him to increase his walking exercise program and follow a low fat diet and portion control. I congratulated him on his weight loss. 6. PAF and atypical atrial flutter s/p PVI maintaining NSR. Continue warfarin    Current medicines are  reviewed at length with the patient today.  The patient does not have concerns regarding medicines.  The following changes have been made:  no change  Labs/ tests ordered today: See above Assessment and Plan No orders of the defined types were placed in this encounter.     Disposition:   FU with me in 6 months  Signed, Sueanne Margarita, MD  07/27/2015 9:31 AM    [facility] [address], [place], [place]  [address] Phone: [phone]; Fax: [phone]

## 2015-07-28 ENCOUNTER — Telehealth: Payer: Self-pay | Admitting: Cardiology

## 2015-07-28 ENCOUNTER — Encounter: Payer: Self-pay | Admitting: Medical

## 2015-07-28 ENCOUNTER — Ambulatory Visit (INDEPENDENT_AMBULATORY_CARE_PROVIDER_SITE_OTHER): Payer: Medicaid Other | Admitting: Medical

## 2015-07-28 VITALS — BP 150/80 | HR 95 | Resp 20 | Wt 284.0 lb

## 2015-07-28 DIAGNOSIS — Z20828 Contact with and (suspected) exposure to other viral communicable diseases: Secondary | ICD-10-CM

## 2015-07-28 DIAGNOSIS — J329 Chronic sinusitis, unspecified: Secondary | ICD-10-CM | POA: Diagnosis not present

## 2015-07-28 DIAGNOSIS — J40 Bronchitis, not specified as acute or chronic: Secondary | ICD-10-CM | POA: Diagnosis not present

## 2015-07-28 DIAGNOSIS — H109 Unspecified conjunctivitis: Secondary | ICD-10-CM | POA: Diagnosis not present

## 2015-07-28 LAB — POC INFLUENZA A&B (BINAX/QUICKVUE)
Influenza A, POC: NEGATIVE
Influenza B, POC: NEGATIVE

## 2015-07-28 MED ORDER — HYDROCODONE-HOMATROPINE 5-1.5 MG/5ML PO SYRP: 5.0000 mL | ORAL_SOLUTION | Freq: Three times a day (TID) | ORAL | Status: DC | PRN

## 2015-07-28 MED ORDER — AMOXICILLIN-POT CLAVULANATE 875-125 MG PO TABS: 1.0000 | ORAL_TABLET | Freq: Two times a day (BID) | ORAL | Status: DC

## 2015-07-28 NOTE — Telephone Encounter (Signed)
Informed patient of results and verbal understanding expressed.  

## 2015-07-28 NOTE — Progress Notes (Signed)
Subjective: Chief Complaint  Patient presents with  . bronchitis?    sore throat, said it feels like it is in his chest. said his eyes feel clogged. poss pink eye?   Here for 5 day hx/o not feeling well, cough, congestion, sneezing, some sinus pressure, eyes matted together last few days, thinks he has pink eye too.   Feels a little trouble breathing.   Felt a little feverish.  Denies NVD.   Using some robitussin.   Has had sick contacts including flu contacts. No other aggravating or relieving factors. No other complaint.  Past Medical History  Diagnosis Date  . Cerebrovascular accident [facility])     a. 03/2008 secondary to cardioembolic event;  b. chronic coumadin.  . Central retinal artery occlusion, left eye     diagnosed in 2008  . Obstructive sleep apnea   . Morbid obesity ([place])   . Skin infection     chronic left leg herpetic  . Bell's palsy   . Diabetes mellitus 11/14/2010    TYPE II  . Obesity   . Chronic diastolic CHF (congestive heart failure) ([place])     a. 12/2008 Echo: nl EF.  [name] Hypogonadism, male 05/2013    Dr. Estill Dooms, Urology  . Hypertension   . Hyperlipidemia   . Persistent atrial fibrillation ([place])     a. s/p afib ablation by [facility] 08/01/08  . Atrial flutter ([place])     a. atypical atrial flutter.  [name] CAD (coronary artery disease)     a. 07/2009 Cath: LM nl, LAD nl, LCX nl w/ L->R collats, chronically occluded RCA s/p failed PCI;  b. 07/2012 MV: basal inf mild ischemia.  . Recommendation refused by patient     multiple recommended vaccines refused over the years (flu/pneumovax)  . Hematuria 05/2013    Urology, Dr. Estill Dooms  . Atrial fibrillation ([facility])    ROS as in subjective   Objective: BP 150/80 mmHg  Pulse 95  Resp 20  Wt 284 lb (128.822 kg)  SpO2 97%  General appearance: Alert, WD/WN, no distress, ill appearing                             Skin: warm, no rash, no diaphoresis                           Head: mild sinus tenderness                            Eyes:  conjunctiva with erythema and clear to purulent drainage, PERRLA                            Ears: flat TMs, external ear canals normal                          Nose: septum midline, turbinates swollen, with erythema and mucoid discharge             Mouth/throat: MMM, tongue normal, mild pharyngeal erythema                           Neck: supple, no adenopathy, no thyromegaly, non tender  Heart: RRR, normal S1, S2, no murmurs                         Lungs: +bronchial breath sounds, +scattered rhonchi, no wheezes, no rales                Extremities: no edema, non tender      Assessment: Encounter Diagnoses  Name Primary?  . Sinobronchitis Yes  . Exposure to influenza   . Bilateral conjunctivitis      Plan: Advised rest, good hydration, medications as below, can also use Mucinex DM OTC and Afrin nasal the next few days.  Discuss hygiene and preventing spread of conjunctivitis.  increase water intake.  If worse or not improving in the next few days, then call or recheck  [name] was seen today for bronchitis?.  Diagnoses and all orders for this visit:  Sinobronchitis  Exposure to influenza -     POC Influenza A&B  Bilateral conjunctivitis  Other orders -     HYDROcodone-homatropine (HYCODAN) 5-1.5 MG/5ML syrup; Take 5 mLs by mouth every 8 (eight) hours as needed for cough. -     amoxicillin-clavulanate (AUGMENTIN) 875-125 MG tablet; Take 1 tablet by mouth 2 (two) times daily.

## 2015-07-28 NOTE — Telephone Encounter (Signed)
Follow Up   Pt returned Call.

## 2015-07-28 NOTE — Telephone Encounter (Signed)
-----   Message from Sueanne Margarita, MD sent at 07/27/2015  9:57 PM EST ----- Stable labs - continue current meds

## 2015-08-03 NOTE — Telephone Encounter (Signed)
Called pharmacy & went thru ins on 07/14/15 for $3.00 & pt picked up

## 2015-09-07 ENCOUNTER — Ambulatory Visit (INDEPENDENT_AMBULATORY_CARE_PROVIDER_SITE_OTHER): Payer: Medicare Other | Admitting: *Deleted

## 2015-09-07 DIAGNOSIS — I6350 Cerebral infarction due to unspecified occlusion or stenosis of unspecified cerebral artery: Secondary | ICD-10-CM | POA: Diagnosis not present

## 2015-09-07 DIAGNOSIS — Z5181 Encounter for therapeutic drug level monitoring: Secondary | ICD-10-CM | POA: Diagnosis not present

## 2015-09-07 DIAGNOSIS — I4891 Unspecified atrial fibrillation: Secondary | ICD-10-CM

## 2015-09-07 LAB — POCT INR: INR: 2.2

## 2015-09-22 ENCOUNTER — Telehealth: Payer: Self-pay

## 2015-09-22 ENCOUNTER — Telehealth: Payer: Self-pay | Admitting: Family Medicine

## 2015-09-22 MED ORDER — LOSARTAN POTASSIUM 100 MG PO TABS: 100.0000 mg | ORAL_TABLET | Freq: Every day | ORAL | Status: DC

## 2015-09-22 MED ORDER — OMEGA-3-ACID ETHYL ESTERS 1 G PO CAPS: 2.0000 g | ORAL_CAPSULE | Freq: Two times a day (BID) | ORAL | Status: DC

## 2015-09-22 NOTE — Telephone Encounter (Signed)
done

## 2015-09-22 NOTE — Telephone Encounter (Signed)
Rcvd refill request for Omega 3 Ethyl Esters 1gm cap #120 to [facility] @ [address], [place]

## 2015-09-22 NOTE — Telephone Encounter (Signed)
Received paperwork from [employer] that Diovan is not on pt's formulary. Per Dr. Radford Pax, instructed patient to STOP DIOVAN and START LOSARTAN 100 mg daily when out of Diovan (he only has 4 more pills). Patient agrees with treatment plan and is grateful for call.

## 2015-09-23 ENCOUNTER — Other Ambulatory Visit: Payer: Self-pay | Admitting: *Deleted

## 2015-09-23 MED ORDER — WARFARIN SODIUM 5 MG PO TABS: ORAL_TABLET | ORAL | Status: DC

## 2015-10-25 ENCOUNTER — Ambulatory Visit (INDEPENDENT_AMBULATORY_CARE_PROVIDER_SITE_OTHER): Payer: Medicare Other | Admitting: *Deleted

## 2015-10-25 DIAGNOSIS — I4891 Unspecified atrial fibrillation: Secondary | ICD-10-CM

## 2015-10-25 DIAGNOSIS — I6350 Cerebral infarction due to unspecified occlusion or stenosis of unspecified cerebral artery: Secondary | ICD-10-CM

## 2015-10-25 DIAGNOSIS — Z5181 Encounter for therapeutic drug level monitoring: Secondary | ICD-10-CM | POA: Diagnosis not present

## 2015-10-25 LAB — POCT INR: INR: 2.5

## 2015-10-28 ENCOUNTER — Telehealth: Payer: Self-pay | Admitting: Medical

## 2015-10-28 ENCOUNTER — Other Ambulatory Visit: Payer: Self-pay | Admitting: Medical

## 2015-10-28 MED ORDER — VALACYCLOVIR HCL 500 MG PO TABS: ORAL_TABLET | ORAL | Status: DC

## 2015-10-28 NOTE — Telephone Encounter (Signed)
Requesting refill on Valacyclovir 500 mg to [facility] at [place] at [address]

## 2015-10-28 NOTE — Telephone Encounter (Signed)
done

## 2015-12-06 ENCOUNTER — Ambulatory Visit (INDEPENDENT_AMBULATORY_CARE_PROVIDER_SITE_OTHER): Payer: Medicare Other | Admitting: *Deleted

## 2015-12-06 DIAGNOSIS — I6350 Cerebral infarction due to unspecified occlusion or stenosis of unspecified cerebral artery: Secondary | ICD-10-CM | POA: Diagnosis not present

## 2015-12-06 DIAGNOSIS — Z5181 Encounter for therapeutic drug level monitoring: Secondary | ICD-10-CM | POA: Diagnosis not present

## 2015-12-06 DIAGNOSIS — I4891 Unspecified atrial fibrillation: Secondary | ICD-10-CM | POA: Diagnosis not present

## 2015-12-06 LAB — POCT INR: INR: 3.1

## 2016-01-06 ENCOUNTER — Encounter: Payer: Self-pay | Admitting: Internal Medicine

## 2016-01-06 ENCOUNTER — Ambulatory Visit (INDEPENDENT_AMBULATORY_CARE_PROVIDER_SITE_OTHER): Payer: Medicare Other | Admitting: Pharmacist

## 2016-01-06 ENCOUNTER — Ambulatory Visit (INDEPENDENT_AMBULATORY_CARE_PROVIDER_SITE_OTHER): Payer: Medicare Other | Admitting: Internal Medicine

## 2016-01-06 VITALS — BP 142/84 | HR 83 | Ht 70.0 in | Wt 282.0 lb

## 2016-01-06 DIAGNOSIS — I6350 Cerebral infarction due to unspecified occlusion or stenosis of unspecified cerebral artery: Secondary | ICD-10-CM | POA: Diagnosis not present

## 2016-01-06 DIAGNOSIS — Z5181 Encounter for therapeutic drug level monitoring: Secondary | ICD-10-CM

## 2016-01-06 DIAGNOSIS — I4891 Unspecified atrial fibrillation: Secondary | ICD-10-CM

## 2016-01-06 DIAGNOSIS — I4819 Other persistent atrial fibrillation: Secondary | ICD-10-CM

## 2016-01-06 DIAGNOSIS — I10 Essential (primary) hypertension: Secondary | ICD-10-CM | POA: Diagnosis not present

## 2016-01-06 DIAGNOSIS — I481 Persistent atrial fibrillation: Secondary | ICD-10-CM

## 2016-01-06 LAB — POCT INR: INR: 3

## 2016-01-06 NOTE — Patient Instructions (Addendum)

## 2016-01-06 NOTE — Progress Notes (Signed)
PCP: Wyatt Haste, MD Primary Cardiologist:  Dr Elberta Fortis is a 65 y.o. male who presents today for routine electrophysiology followup.  Since his last visit, the patient reports doing very well.   No symptoms of AF.  He has lost 9 lbs since his last visit.  Today, he denies symptoms of palpitations, chest pain, shortness of breath,  lower extremity edema, dizziness, presyncope, or syncope.  The patient is otherwise without complaint today.   Past Medical History  Diagnosis Date  . Cerebrovascular accident [facility])     a. 03/2008 secondary to cardioembolic event;  b. chronic coumadin.  . Central retinal artery occlusion, left eye     diagnosed in 2008  . Obstructive sleep apnea   . Morbid obesity ([place])   . Skin infection     chronic left leg herpetic  . Bell's palsy   . Diabetes mellitus 11/14/2010    TYPE II  . Obesity   . Chronic diastolic CHF (congestive heart failure) ([place])     a. 12/2008 Echo: nl EF.  [name] Hypogonadism, male 05/2013    Dr. Estill Dooms, Urology  . Hypertension   . Hyperlipidemia   . Persistent atrial fibrillation ([place])     a. s/p afib ablation by [facility] 08/01/08  . Atrial flutter ([place])     a. atypical atrial flutter.  [name] CAD (coronary artery disease)     a. 07/2009 Cath: LM nl, LAD nl, LCX nl w/ L->R collats, chronically occluded RCA s/p failed PCI;  b. 07/2012 MV: basal inf mild ischemia.  . Recommendation refused by patient     multiple recommended vaccines refused over the years (flu/pneumovax)  . Hematuria 05/2013    Urology, Dr. Estill Dooms  . Atrial fibrillation [facility])    Past Surgical History  Procedure Laterality Date  . Atrial ablation surgery  08/01/08    CTI and PVI ablation by JA  . Colonoscopy      2012 per patient  . Electrophysiologic study N/A 11/30/2014    Procedure: Atrial Fibrillation Ablation;  Surgeon: [name], MD;  Location: [place] CV LAB;  Service: Cardiovascular;  Laterality: N/A;  . Tee without cardioversion N/A 11/30/2014   Procedure: TRANSESOPHAGEAL ECHOCARDIOGRAM (TEE);  Surgeon: Josue Hector, MD;  Location: [facility] ENDOSCOPY;  Service: Cardiovascular;  Laterality: N/A;    ROS- all systems are reviewed and negatives except as per HPI above  Current Outpatient Prescriptions  Medication Sig Dispense Refill  . ACCU-CHEK FASTCLIX LANCETS MISC USE TO TEST BLOOD SUGAR DAILY 102 each 0  . amLODipine (NORVASC) 10 MG tablet Take 1 tablet (10 mg total) by mouth daily. 90 tablet 3  . atorvastatin (LIPITOR) 80 MG tablet TAKE ONE TABLET BY MOUTH ONCE DAILY 30 tablet 9  . b complex vitamins capsule Take 1 capsule by mouth every other day.     . Cholecalciferol (VITAMIN D3) 10000 UNITS capsule Take 1 capsule (10,000 Units total) by mouth daily. 4 capsule 0  . glucose blood (ACCU-CHEK AVIVA PLUS) test strip 50 each by Other route as needed for other. Use as instructed 50 each 10  . isosorbide mononitrate (IMDUR) 30 MG 24 hr tablet TAKE ONE TABLET BY MOUTH ONCE DAILY 30 tablet 9  . losartan (COZAAR) 100 MG tablet Take 1 tablet (100 mg total) by mouth daily. 30 tablet 11  . metFORMIN (GLUCOPHAGE) 1000 MG tablet TAKE ONE TABLET BY MOUTH TWICE DAILY WITH A MEAL 180 tablet 3  . metoprolol (LOPRESSOR) 50 MG tablet TAKE ONE  TABLET BY MOUTH TWICE DAILY 60 tablet 9  . omega-3 acid ethyl esters (LOVAZA) 1 g capsule Take 2 capsules (2 g total) by mouth 2 (two) times daily. 120 capsule 3  . Testosterone (ANDROGEL PUMP) 1.25 GM/ACT (1%) GEL Place 2 application onto the skin daily.     . valACYclovir (VALTREX) 500 MG tablet TAKE ONE CAPLET BY MOUTH ONCE DAILY 90 tablet 1  . warfarin (COUMADIN) 5 MG tablet TAKE AS DIRECTED BY COUMADIN CLINIC 45 tablet 3   No current facility-administered medications for this visit.    Physical Exam: Filed Vitals:   01/06/16 0802  BP: 142/84  Pulse: 83  Height: 5\' 10"  (1.778 m)  Weight: 282 lb (127.914 kg)    GEN- The patient is well appearing, alert and oriented x 3 today.   Head- normocephalic,  atraumatic, L facial droop is stable Eyes-  Sclera clear, conjunctiva pink Ears- hearing intact Oropharynx- clear Lungs- Clear to ausculation bilaterally, normal work of breathing Heart- Regular rate and rhythm, no murmurs, rubs or gallops, PMI not laterally displaced GI- soft, NT, ND, + BS Extremities- no clubbing, cyanosis, or edema  ekg today reveals sinus rhythm  Assessment and Plan:   1. Paroxysmal atrial fibrillation and atrial flutter Doing well s/p ablation off AAD therapy Continue long term anticoagulation Today, I discussed Coumadin as well as novel anticoagulants including Pradaxa, Xarelto, Savaysa, and Eliquis today as indicated for risk reduction in stroke and systemic emboli with nonvalvular atrial fibrillation.  Risks, benefits, and alternatives to each of these drugs were discussed at length today.  He would like to consider xarelto but will discuss with his wife first. He can call my office if he decides to consider this medicine.  2. Obesity Weight loss encouraged Body mass index is 40.46 kg/(m^2).  3. HTN Stable No change required today  Follow-up with Dr Radford Pax in 3 months Return to see me in 6 months  [name] MD, [facility] 01/06/2016 8:45 AM

## 2016-01-25 ENCOUNTER — Other Ambulatory Visit: Payer: Self-pay | Admitting: Cardiology

## 2016-01-27 ENCOUNTER — Encounter: Payer: Self-pay | Admitting: Cardiology

## 2016-01-27 ENCOUNTER — Ambulatory Visit (INDEPENDENT_AMBULATORY_CARE_PROVIDER_SITE_OTHER): Payer: Medicare Other | Admitting: Cardiology

## 2016-01-27 VITALS — BP 130/76 | HR 76 | Ht 70.0 in | Wt 285.4 lb

## 2016-01-27 DIAGNOSIS — I10 Essential (primary) hypertension: Secondary | ICD-10-CM

## 2016-01-27 DIAGNOSIS — I2510 Atherosclerotic heart disease of native coronary artery without angina pectoris: Secondary | ICD-10-CM

## 2016-01-27 DIAGNOSIS — G4733 Obstructive sleep apnea (adult) (pediatric): Secondary | ICD-10-CM | POA: Diagnosis not present

## 2016-01-27 DIAGNOSIS — I6350 Cerebral infarction due to unspecified occlusion or stenosis of unspecified cerebral artery: Secondary | ICD-10-CM | POA: Diagnosis not present

## 2016-01-27 DIAGNOSIS — I5032 Chronic diastolic (congestive) heart failure: Secondary | ICD-10-CM

## 2016-01-27 DIAGNOSIS — I480 Paroxysmal atrial fibrillation: Secondary | ICD-10-CM | POA: Diagnosis not present

## 2016-01-27 DIAGNOSIS — I2583 Coronary atherosclerosis due to lipid rich plaque: Principal | ICD-10-CM

## 2016-01-27 DIAGNOSIS — E782 Mixed hyperlipidemia: Secondary | ICD-10-CM

## 2016-01-27 LAB — CBC WITH DIFFERENTIAL/PLATELET
Basophils Absolute: 79 cells/uL (ref 0–200)
Basophils Relative: 1 %
Eosinophils Absolute: 316 cells/uL (ref 15–500)
Eosinophils Relative: 4 %
HCT: 42.6 % (ref 38.5–50.0)
Hemoglobin: 14.5 g/dL (ref 13.2–17.1)
Lymphocytes Relative: 34 %
Lymphs Abs: 2686 cells/uL (ref 850–3900)
MCH: 30 pg (ref 27.0–33.0)
MCHC: 34 g/dL (ref 32.0–36.0)
MCV: 88.2 fL (ref 80.0–100.0)
MPV: 10.7 fL (ref 7.5–12.5)
Monocytes Absolute: 869 cells/uL (ref 200–950)
Monocytes Relative: 11 %
Neutro Abs: 3950 cells/uL (ref 1500–7800)
Neutrophils Relative %: 50 %
Platelets: 253 10*3/uL (ref 140–400)
RBC: 4.83 MIL/uL (ref 4.20–5.80)
RDW: 15 % (ref 11.0–15.0)
WBC: 7.9 10*3/uL (ref 3.8–10.8)

## 2016-01-27 LAB — LIPID PANEL
Cholesterol: 153 mg/dL (ref 125–200)
HDL: 42 mg/dL (ref >=40)
LDL Cholesterol: 81 mg/dL (ref <130)
Total CHOL/HDL Ratio: 3.6 Ratio (ref <=5.0)
Triglycerides: 150 mg/dL — ABNORMAL HIGH (ref <150)
VLDL: 30 mg/dL (ref <30)

## 2016-01-27 LAB — BASIC METABOLIC PANEL
BUN: 14 mg/dL (ref 7–25)
CO2: 24 mmol/L (ref 20–31)
Calcium: 8.8 mg/dL (ref 8.6–10.3)
Chloride: 103 mmol/L (ref 98–110)
Creat: 0.9 mg/dL (ref 0.70–1.25)
Glucose, Bld: 160 mg/dL — ABNORMAL HIGH (ref 65–99)
Potassium: 4.1 mmol/L (ref 3.5–5.3)
Sodium: 137 mmol/L (ref 135–146)

## 2016-01-27 LAB — HEPATIC FUNCTION PANEL
ALT: 21 U/L (ref 9–46)
AST: 18 U/L (ref 10–35)
Albumin: 4.3 g/dL (ref 3.6–5.1)
Alkaline Phosphatase: 62 U/L (ref 40–115)
Bilirubin, Direct: 0.1 mg/dL (ref <=0.2)
Indirect Bilirubin: 0.5 mg/dL (ref 0.2–1.2)
Total Bilirubin: 0.6 mg/dL (ref 0.2–1.2)
Total Protein: 6.8 g/dL (ref 6.1–8.1)

## 2016-01-27 NOTE — Patient Instructions (Signed)
Medication Instructions:  Your physician recommends that you continue on your current medications as directed. Please refer to the Current Medication list given to you today.   Labwork: TODAY: BMET, CBC, LFTs, Lipids  Testing/Procedures: None  Follow-Up: Your physician wants you to follow-up in: 6 months with Dr. Turner. You will receive a reminder letter in the mail two months in advance. If you don't receive a letter, please call our office to schedule the follow-up appointment.   Any Other Special Instructions Will Be Listed Below (If Applicable).     If you need a refill on your cardiac medications before your next appointment, please call your pharmacy.   

## 2016-01-27 NOTE — Progress Notes (Signed)
Cardiology Office Note    Date:  01/27/2016   ID:  [name], DOB 12/17/1950, MRN [id number]  PCP:  Wyatt Haste, MD  Cardiologist:  Fransico Him, MD   No chief complaint on file.   History of Present Illness:  [name] is a 65 y.o. male with a history of ASCAD, HTN, OSA, morbid obesity, PAF, dyslipidemia who presents today for followup. He is doing well.He is s/p afib ablation by Dr. Rayann Heman but unable to ablate atypical atrial flutter. He denies any chest pain, SOB, DOE, LE edema, dizziness or syncope.He tolerates his CPAP well. He tolerates the pressure and nasal mask without problems. He feels rested when he gets up and has no daytime sleepiness.    Past Medical History  Diagnosis Date  . Cerebrovascular accident [facility])     a. 03/2008 secondary to cardioembolic event;  b. chronic coumadin.  . Central retinal artery occlusion, left eye     diagnosed in 2008  . Obstructive sleep apnea   . Morbid obesity ([place])   . Skin infection     chronic left leg herpetic  . Bell's palsy   . Diabetes mellitus 11/14/2010    TYPE II  . Obesity   . Chronic diastolic CHF (congestive heart failure) ([place])     a. 12/2008 Echo: nl EF.  [name] Hypogonadism, male 05/2013    Dr. Estill Dooms, Urology  . Hypertension   . Hyperlipidemia   . Persistent atrial fibrillation ([place])     a. s/p afib ablation by [facility] 08/01/08  . Atrial flutter ([place])     a. atypical atrial flutter.  [name] CAD (coronary artery disease)     a. 07/2009 Cath: LM nl, LAD nl, LCX nl w/ L->R collats, chronically occluded RCA s/p failed PCI;  b. 07/2012 MV: basal inf mild ischemia.  . Recommendation refused by patient     multiple recommended vaccines refused over the years (flu/pneumovax)  . Hematuria 05/2013    Urology, Dr. Estill Dooms  . Atrial fibrillation ([place])   . Chronic diastolic heart failure ([place]) 01/27/2016    Past Surgical History  Procedure Laterality Date  . Atrial ablation surgery  08/01/08    CTI and PVI ablation by  JA  . Colonoscopy      2012 per patient  . Electrophysiologic study N/A 11/30/2014    Procedure: Atrial Fibrillation Ablation;  Surgeon: [name], MD;  Location: [place] CV LAB;  Service: Cardiovascular;  Laterality: N/A;  . Tee without cardioversion N/A 11/30/2014    Procedure: TRANSESOPHAGEAL ECHOCARDIOGRAM (TEE);  Surgeon: Josue Hector, MD;  Location: [facility] ENDOSCOPY;  Service: Cardiovascular;  Laterality: N/A;    Current Medications: Outpatient Prescriptions Prior to Visit  Medication Sig Dispense Refill  . ACCU-CHEK FASTCLIX LANCETS MISC USE TO TEST BLOOD SUGAR DAILY 102 each 0  . amLODipine (NORVASC) 10 MG tablet Take 1 tablet (10 mg total) by mouth daily. 90 tablet 3  . atorvastatin (LIPITOR) 80 MG tablet TAKE ONE TABLET BY MOUTH ONCE DAILY 30 tablet 9  . b complex vitamins capsule Take 1 capsule by mouth every other day.     . Cholecalciferol (VITAMIN D3) 10000 UNITS capsule Take 1 capsule (10,000 Units total) by mouth daily. 4 capsule 0  . glucose blood (ACCU-CHEK AVIVA PLUS) test strip 50 each by Other route as needed for other. Use as instructed 50 each 10  . isosorbide mononitrate (IMDUR) 30 MG 24 hr tablet TAKE ONE TABLET BY MOUTH ONCE DAILY 30 tablet 9  .  losartan (COZAAR) 100 MG tablet Take 1 tablet (100 mg total) by mouth daily. 30 tablet 11  . metFORMIN (GLUCOPHAGE) 1000 MG tablet TAKE ONE TABLET BY MOUTH TWICE DAILY WITH A MEAL 180 tablet 3  . metoprolol (LOPRESSOR) 50 MG tablet TAKE ONE TABLET BY MOUTH TWICE DAILY 60 tablet 9  . omega-3 acid ethyl esters (LOVAZA) 1 g capsule Take 2 capsules (2 g total) by mouth 2 (two) times daily. 120 capsule 3  . Testosterone (ANDROGEL PUMP) 1.25 GM/ACT (1%) GEL Place 2 application onto the skin daily.     . valACYclovir (VALTREX) 500 MG tablet TAKE ONE CAPLET BY MOUTH ONCE DAILY 90 tablet 1  . warfarin (COUMADIN) 5 MG tablet Take as directed by [facility]. 45 tablet 3   No facility-administered medications prior to visit.      Allergies:   Crestor; Zetia; and Lisinopril   Social History   Social History  . Marital Status: Married    Spouse Name: N/A  . Number of Children: N/A  . Years of Education: N/A   Social History Main Topics  . Smoking status: Former Smoker    Quit date: 07/24/1979  . Smokeless tobacco: None  . Alcohol Use: No     Comment: former  . Drug Use: No     Comment: former  . Sexual Activity: Not Asked   Other Topics Concern  . None   Social History Narrative   Lives in [place], [place]. With his spouse and son. Has remote history of tobacco, but quit 30 years ago. Has heavy history of alcohol consumption but quit 30 years ago. Previously used marijuana and cocaine, but denies use over the past 30 years. 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      Family History:  The patient's family history includes Heart disease in his father and mother; Stroke in his father.   ROS:   Please see the history of present illness.    ROS All other systems reviewed and are negative.   PHYSICAL EXAM:   VS:  BP 130/76 mmHg  Pulse 76  Ht 5\' 10"  (1.778 m)  Wt 285 lb 6.4 oz (129.457 kg)  BMI 40.95 kg/m2   GEN: Well nourished, well developed, in no acute distress HEENT: normal Neck: no JVD, carotid bruits, or masses Cardiac: RRR; no murmurs, rubs, or gallops,no edema.  Intact distal pulses bilaterally.  Respiratory:  clear to auscultation bilaterally, normal work of breathing GI: soft, nontender, nondistended, + BS MS: no deformity or atrophy Skin: warm and dry, no rash Neuro:  Alert and Oriented x 3, Strength and sensation are intact Psych: euthymic mood, full affect  Wt Readings from Last 3 Encounters:  01/27/16 285 lb 6.4 oz (129.457 kg)  01/06/16 282 lb (127.914 kg)  07/28/15 284 lb (128.822 kg)      Studies/Labs Reviewed:   EKG:  EKG is not ordered today.    Recent Labs: 07/27/2015: ALT 25; BUN 15; Creat 0.89; Potassium 4.2; Sodium 137   Lipid Panel    Component Value  Date/Time   CHOL 135 07/27/2015 1007   TRIG 149 07/27/2015 1007   HDL 36* 07/27/2015 1007   CHOLHDL 3.8 07/27/2015 1007   VLDL 30 07/27/2015 1007   LDLCALC 69 07/27/2015 1007   LDLDIRECT 87.7 05/08/2013 0837    Additional studies/ records that were reviewed today include:  CPAP download    ASSESSMENT:    1. Coronary artery disease due to lipid rich plaque   2. Paroxysmal  atrial fibrillation ([place])   3. Essential hypertension, benign   4. OSA (obstructive sleep apnea)   5. Mixed dyslipidemia   6. Chronic diastolic heart failure (HCC)      PLAN:  In order of problems listed above:  1. ASCAD with chronically occluded RCA and left to right collaterals by last cath.  He has no angina. Continue Long acting nitrate/BB and statin.  Not on ASA due to warfarin.  2. Paroxysmal atrial fibrillation and flutter s/p ablation- maintaining NSR and off AAD therapy.  Continue warfarin.  He is contemplating switching to a novel anticoagulant. Check CBC today.  3. HTN - BP controlled on current medical regimen.  Continue amlodipine/ARB/BB.  Check BMET. OSA - the patient is tolerating PAP therapy well without any problems. The patient has been using and benefiting from CPAP use and will continue to benefit from therapy. I will get a download from his DME. Dyslipidemia - LDL goal < 70.  Continue statin.  Check FLP and ALT.  Chronic diastolic CHF - he appears euvolemic on exam today.  Continue BB and ARB.       Medication Adjustments/Labs and Tests Ordered: Current medicines are reviewed at length with the patient today.  Concerns regarding medicines are outlined above.  Medication changes, Labs and Tests ordered today are listed in the Patient Instructions below.  There are no Patient Instructions on file for this visit.   Signed, Fransico Him, MD  01/27/2016 8:50 AM    [facility] [address], [place], [place]  [address] Phone: [phone]; Fax: [phone]

## 2016-01-29 ENCOUNTER — Other Ambulatory Visit: Payer: Self-pay | Admitting: Medical

## 2016-02-02 ENCOUNTER — Other Ambulatory Visit: Payer: Self-pay | Admitting: *Deleted

## 2016-02-02 DIAGNOSIS — G4733 Obstructive sleep apnea (adult) (pediatric): Secondary | ICD-10-CM

## 2016-02-14 ENCOUNTER — Encounter: Payer: Self-pay | Admitting: Cardiology

## 2016-02-22 ENCOUNTER — Telehealth: Payer: Self-pay | Admitting: Medical

## 2016-02-22 ENCOUNTER — Other Ambulatory Visit: Payer: Self-pay | Admitting: Medical

## 2016-02-22 NOTE — Telephone Encounter (Signed)
Send refill

## 2016-02-22 NOTE — Telephone Encounter (Signed)
Pt called for refills of strips and lancets sent to [facility] [place]

## 2016-02-23 ENCOUNTER — Encounter: Payer: Self-pay | Admitting: Medical

## 2016-02-23 ENCOUNTER — Ambulatory Visit: Payer: Medicaid Other | Admitting: Family Medicine

## 2016-02-23 ENCOUNTER — Other Ambulatory Visit: Payer: Self-pay

## 2016-02-23 ENCOUNTER — Ambulatory Visit (INDEPENDENT_AMBULATORY_CARE_PROVIDER_SITE_OTHER): Payer: Medicare Other | Admitting: Medical

## 2016-02-23 VITALS — BP 150/90 | HR 75 | Wt 282.0 lb

## 2016-02-23 DIAGNOSIS — M79605 Pain in left leg: Secondary | ICD-10-CM

## 2016-02-23 DIAGNOSIS — M5432 Sciatica, left side: Secondary | ICD-10-CM | POA: Diagnosis not present

## 2016-02-23 DIAGNOSIS — M5442 Lumbago with sciatica, left side: Secondary | ICD-10-CM

## 2016-02-23 MED ORDER — ACCU-CHEK FASTCLIX LANCETS MISC: 1 refills | Status: DC

## 2016-02-23 MED ORDER — CYCLOBENZAPRINE HCL 10 MG PO TABS: ORAL_TABLET | ORAL | 0 refills | Status: DC

## 2016-02-23 MED ORDER — HYDROCODONE-ACETAMINOPHEN 5-325 MG PO TABS: 1.0000 | ORAL_TABLET | Freq: Four times a day (QID) | ORAL | 0 refills | Status: DC | PRN

## 2016-02-23 MED ORDER — GLUCOSE BLOOD VI STRP: 1.0000 | ORAL_STRIP | Freq: Two times a day (BID) | 10 refills | Status: DC

## 2016-02-23 MED ORDER — GLUCOSE BLOOD VI STRP: 50.0000 | ORAL_STRIP | 10 refills | Status: DC | PRN

## 2016-02-23 NOTE — Telephone Encounter (Signed)
done

## 2016-02-23 NOTE — Telephone Encounter (Signed)
Is this ok to refill?  

## 2016-02-23 NOTE — Progress Notes (Signed)
Subjective: Chief Complaint  Patient presents with  . Hip Pain    back and leg pain. heading towards his shoulder pain.    Here for hip pain, back pain.   No injury, no fall.  Started in left hip.  Has had pain for about a week.  Worse in low back and left leg.  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   Having some pains up under right shoulder blade in back.  Taking some of wife's ibuprofen, but stopped this and switched to tylenol due to being on coumadin.  No numbness, no tingling, no weakness.  In general, not doing heavy lifting, not a lot of strenuous activity.  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  No fever, no incontinence, no abdominal pain, no change in bowels, urination.  No other aggravating or relieving factors. No other complaint.   Past Medical History:  Diagnosis Date  . Atrial fibrillation ([place])   . Atrial flutter ([place])    a. atypical atrial flutter.  . Bell's palsy   . CAD (coronary artery disease)    a. 07/2009 Cath: LM nl, LAD nl, LCX nl w/ L->R collats, chronically occluded RCA s/p failed PCI;  b. 07/2012 MV: basal inf mild ischemia.  . Central retinal artery occlusion, left eye    diagnosed in 2008  . Cerebrovascular accident [facility])    a. 03/2008 secondary to cardioembolic event;  b. chronic coumadin.  . Chronic diastolic CHF (congestive heart failure) ([place])    a. 12/2008 Echo: nl EF.  [name] Chronic diastolic heart failure ([place]) 01/27/2016  . Diabetes mellitus 11/14/2010   TYPE II  . Hematuria 05/2013   Urology, Dr. Estill Dooms  . Hyperlipidemia   . Hypertension   . Hypogonadism, male 05/2013   Dr. Estill Dooms, Urology  . Morbid obesity ([place])   . Obesity   . Obstructive sleep apnea   . Persistent atrial fibrillation ([place])    a. s/p afib ablation by [facility] 08/01/08  . Recommendation refused by patient    multiple recommended vaccines refused over the years (flu/pneumovax)  . Skin infection    chronic  left leg herpetic   Past Surgical History:  Procedure Laterality Date  . ATRIAL ABLATION SURGERY  08/01/08   CTI and PVI ablation by JA  . COLONOSCOPY     2012 per patient  . ELECTROPHYSIOLOGIC STUDY N/A 11/30/2014   Procedure: Atrial Fibrillation Ablation;  Surgeon: [name], MD;  Location: [place] CV LAB;  Service: Cardiovascular;  Laterality: N/A;  . TEE WITHOUT CARDIOVERSION N/A 11/30/2014   Procedure: TRANSESOPHAGEAL ECHOCARDIOGRAM (TEE);  Surgeon: Josue Hector, MD;  Location: [facility] ENDOSCOPY;  Service: Cardiovascular;  Laterality: N/A;   Current Outpatient Prescriptions on File Prior to Visit  Medication Sig Dispense Refill  . ACCU-CHEK FASTCLIX LANCETS MISC USE TO TEST BLOOD SUGAR DAILY 102 each 0  . amLODipine (NORVASC) 10 MG tablet Take 1 tablet (10 mg total) by mouth daily. 90 tablet 3  . atorvastatin (LIPITOR) 80 MG tablet TAKE ONE TABLET BY MOUTH ONCE DAILY 30 tablet 9  . b complex vitamins capsule Take 1 capsule by mouth every other day.     . Cholecalciferol (VITAMIN D3) 10000 UNITS capsule Take 1 capsule (10,000 Units total) by mouth daily. 4 capsule 0  . glucose blood (ACCU-CHEK AVIVA PLUS) test strip 50 each by Other route as needed for other. Use as instructed  50 each 10  . isosorbide mononitrate (IMDUR) 30 MG 24 hr tablet TAKE ONE TABLET BY MOUTH ONCE DAILY 30 tablet 9  . losartan (COZAAR) 100 MG tablet Take 1 tablet (100 mg total) by mouth daily. 30 tablet 11  . metFORMIN (GLUCOPHAGE) 1000 MG tablet TAKE ONE TABLET BY MOUTH TWICE DAILY WITH A MEAL 180 tablet 3  . metoprolol (LOPRESSOR) 50 MG tablet TAKE ONE TABLET BY MOUTH TWICE DAILY 60 tablet 9  . omega-3 acid ethyl esters (LOVAZA) 1 g capsule take 2 capsules by mouth twice a day 120 capsule 3  . Testosterone (ANDROGEL PUMP) 1.25 GM/ACT (1%) GEL Place 2 application onto the skin daily.     . valACYclovir (VALTREX) 500 MG tablet TAKE ONE CAPLET BY MOUTH ONCE DAILY 90 tablet 1  . warfarin (COUMADIN) 5 MG tablet  Take as directed by [facility] Clinic. 45 tablet 3   No current facility-administered medications on file prior to visit.     ROS as in subjective   Objective: BP (!) 150/90   Pulse 75   Wt 282 lb (127.9 kg)   BMI 40.46 kg/m   Gen: wd, wn, nad No rash or skin findings Obese white male Abdomen: +bs, soft, nontender, no mass, no organomegaly Back: tender under right scapula in paraspinal muscles, tender left low back, otherwise good ROM but with pain Hips bilat with mildly reduced internal and exercise ROM, but nontender with ROM, legs nontender, no obvious deformity No LE edema Pedal pulses 1+ Legs with no obvious SLR pain, normal LE strength, sensation, DTRs.    Assessment: Encounter Diagnoses  Name Primary?  . Left sided sciatica Yes  . Left-sided low back pain with left-sided sciatica   . Pain of left lower extremity      Plan: symptoms and exam suggest sciatica flare.   Begin flexeril 1/2-1 tablet po QHS or up to BID prn.   Begin hydrocodone but not at same time with flexeril.  Use hydrocodone for worse pain.  alternate these by at least 3-4 hours.   Can use stretching, gentle ROM as discussed.  Avoid heavy lifting, avoid staying in one position too long.  No long commute or heavy labor in the next 4-5 days.   Discussed gradual return to activity.  discussed proper lifting.  If not improving or worse in the next 4-5 days then recheck.  If red flag symptoms as discussed, get checked right away.    [name] was seen today for hip pain.  Diagnoses and all orders for this visit:  Left sided sciatica  Left-sided low back pain with left-sided sciatica  Pain of left lower extremity  Other orders -     HYDROcodone-acetaminophen (NORCO) 5-325 MG tablet; Take 1 tablet by mouth every 6 (six) hours as needed for moderate pain. -     cyclobenzaprine (FLEXERIL) 10 MG tablet; 1/2-1 tablet po QHS prn or up to BID

## 2016-02-27 ENCOUNTER — Ambulatory Visit (INDEPENDENT_AMBULATORY_CARE_PROVIDER_SITE_OTHER): Payer: Medicare Other | Admitting: Medical

## 2016-02-27 ENCOUNTER — Encounter: Payer: Self-pay | Admitting: Medical

## 2016-02-27 ENCOUNTER — Telehealth: Payer: Self-pay

## 2016-02-27 VITALS — BP 120/70 | HR 74 | Temp 97.8°F | Resp 16 | Wt 286.0 lb

## 2016-02-27 DIAGNOSIS — M7989 Other specified soft tissue disorders: Secondary | ICD-10-CM | POA: Diagnosis not present

## 2016-02-27 DIAGNOSIS — R233 Spontaneous ecchymoses: Secondary | ICD-10-CM

## 2016-02-27 DIAGNOSIS — I6350 Cerebral infarction due to unspecified occlusion or stenosis of unspecified cerebral artery: Secondary | ICD-10-CM | POA: Diagnosis not present

## 2016-02-27 MED ORDER — TRAMADOL HCL 50 MG PO TABS: 50.0000 mg | ORAL_TABLET | Freq: Three times a day (TID) | ORAL | 0 refills | Status: DC | PRN

## 2016-02-27 NOTE — Progress Notes (Signed)
Subjective: Chief Complaint  Patient presents with  . Rash    swelling bilateral LE- rash on legs as well spreading to back. Taking hydrocodone and flexeril since last OV- stopped flexeril after developing rash.    Here for rash and swelling on legs.   Presents with wife today.  I saw him last week for back, hip, leg pain, sciatica.  He has been using the flexeril and hydrocodone prescribed last week.  Over the weekend developed quite a bit of swelling in legs and feet, mostly ankles and feet, and developed rash.  He thought rash was related to flexeril so he stopped this.  He has tolerated hydrocodone fine in the past, so wasn't worried hydrocodone was causing this.   He denies itching.  He does report improvement in swelling.   Rash is about the same. Denies chest pain, SOB, no other joint pain or swelling. His back and leg pains are improved from last visit while on the medication.  Denies fever, SOB, chest pain, no bleeding, no bruising, no change in urination, no other c/o.   Past Medical History:  Diagnosis Date  . Atrial fibrillation ([place])   . Atrial flutter ([place])    a. atypical atrial flutter.  . Bell's palsy   . CAD (coronary artery disease)    a. 07/2009 Cath: LM nl, LAD nl, LCX nl w/ L->R collats, chronically occluded RCA s/p failed PCI;  b. 07/2012 MV: basal inf mild ischemia.  . Central retinal artery occlusion, left eye    diagnosed in 2008  . Cerebrovascular accident [facility])    a. 03/2008 secondary to cardioembolic event;  b. chronic coumadin.  . Chronic diastolic CHF (congestive heart failure) ([place])    a. 12/2008 Echo: nl EF.  [name] Chronic diastolic heart failure ([place]) 01/27/2016  . Diabetes mellitus 11/14/2010   TYPE II  . Hematuria 05/2013   Urology, Dr. Estill Dooms  . Hyperlipidemia   . Hypertension   . Hypogonadism, male 05/2013   Dr. Estill Dooms, Urology  . Morbid obesity ([place])   . Obesity   . Obstructive sleep apnea   . Persistent atrial fibrillation ([place])    a. s/p afib ablation by  [facility] 08/01/08  . Recommendation refused by patient    multiple recommended vaccines refused over the years (flu/pneumovax)  . Skin infection    chronic left leg herpetic   ROS as in subjective  Objective: BP 120/70   Pulse 74   Temp 97.8 F (36.6 C) (Oral)   Resp 16   Wt 286 lb (129.7 kg)   SpO2 97%   BMI 41.04 kg/m   Gen: wd, wn, nad There appears to be scattered round purplish red flat lesions throughout lower legs and faint appearing similar lesions on dorsal feet.  None on soles and palms.  The lesions are somewhat petechial appearing.   No other rash Oral: MMM, no lesions Pulses WNL UE and LE Heart rrr, normal s1, s2, no murmurs Lungs clear No appreciable edema of UE or LE    Assessment: Encounter Diagnoses  Name Primary?  . Petechiae Yes  . Leg swelling     Plan: Discussed case with supervising physician Dr. Redmond School who also examined him.  We will check some labs.  Consider vasculitis, but this may be related to his recent use of NSAID while on coumadin.   Doubt this is related to use of Flexeril, Hydrocodone.  F/u pending labs.  discussed signs/symptoms that would prompt urgent recheck.

## 2016-02-27 NOTE — Telephone Encounter (Signed)
Spoke with patient and gave results of recent download per Dr.Turner. He voiced understanding and thanked me for my call. Encouraged patient to call with any questions or concerns.

## 2016-02-28 LAB — CBC WITH DIFFERENTIAL/PLATELET
Basophils Absolute: 61 cells/uL (ref 0–200)
Basophils Relative: 1 %
Eosinophils Absolute: 183 cells/uL (ref 15–500)
Eosinophils Relative: 3 %
HCT: 46.3 % (ref 38.5–50.0)
Hemoglobin: 15.2 g/dL (ref 13.2–17.1)
Lymphocytes Relative: 37 %
Lymphs Abs: 2257 cells/uL (ref 850–3900)
MCH: 29.7 pg (ref 27.0–33.0)
MCHC: 32.8 g/dL (ref 32.0–36.0)
MCV: 90.4 fL (ref 80.0–100.0)
MPV: 10.8 fL (ref 7.5–12.5)
Monocytes Absolute: 671 cells/uL (ref 200–950)
Monocytes Relative: 11 %
Neutro Abs: 2928 cells/uL (ref 1500–7800)
Neutrophils Relative %: 48 %
Platelets: 185 10*3/uL (ref 140–400)
RBC: 5.12 MIL/uL (ref 4.20–5.80)
RDW: 14.8 % (ref 11.0–15.0)
WBC: 6.1 10*3/uL (ref 4.0–10.5)

## 2016-02-28 LAB — COMPREHENSIVE METABOLIC PANEL
ALT: 22 U/L (ref 9–46)
AST: 19 U/L (ref 10–35)
Albumin: 4.8 g/dL (ref 3.6–5.1)
Alkaline Phosphatase: 61 U/L (ref 40–115)
BUN: 9 mg/dL (ref 7–25)
CO2: 26 mmol/L (ref 20–31)
Calcium: 9.1 mg/dL (ref 8.6–10.3)
Chloride: 102 mmol/L (ref 98–110)
Creat: 0.88 mg/dL (ref 0.70–1.25)
Glucose, Bld: 117 mg/dL — ABNORMAL HIGH (ref 65–99)
Potassium: 5.1 mmol/L (ref 3.5–5.3)
Sodium: 140 mmol/L (ref 135–146)
Total Bilirubin: 0.6 mg/dL (ref 0.2–1.2)
Total Protein: 7.2 g/dL (ref 6.1–8.1)

## 2016-02-28 LAB — PROTIME-INR
INR: 1.7 — ABNORMAL HIGH
Prothrombin Time: 17.9 s — ABNORMAL HIGH (ref 9.0–11.5)

## 2016-02-28 LAB — APTT: aPTT: 36 s — ABNORMAL HIGH (ref 22–34)

## 2016-03-01 ENCOUNTER — Ambulatory Visit (INDEPENDENT_AMBULATORY_CARE_PROVIDER_SITE_OTHER): Payer: Medicare Other | Admitting: Medical

## 2016-03-01 VITALS — BP 122/70 | HR 77 | Resp 16 | Wt 281.0 lb

## 2016-03-01 DIAGNOSIS — I6350 Cerebral infarction due to unspecified occlusion or stenosis of unspecified cerebral artery: Secondary | ICD-10-CM

## 2016-03-01 DIAGNOSIS — L270 Generalized skin eruption due to drugs and medicaments taken internally: Secondary | ICD-10-CM | POA: Diagnosis not present

## 2016-03-01 DIAGNOSIS — M5430 Sciatica, unspecified side: Secondary | ICD-10-CM | POA: Diagnosis not present

## 2016-03-01 DIAGNOSIS — Z7901 Long term (current) use of anticoagulants: Secondary | ICD-10-CM

## 2016-03-01 DIAGNOSIS — D690 Allergic purpura: Secondary | ICD-10-CM

## 2016-03-01 DIAGNOSIS — T50905A Adverse effect of unspecified drugs, medicaments and biological substances, initial encounter: Secondary | ICD-10-CM

## 2016-03-01 NOTE — Progress Notes (Signed)
Subjective: Chief Complaint  Patient presents with  . Follow-up    here for f/u.  feeling better.    Here for recheck on sciatica,  hip pain, back pain and purpura and swelling from last visit.  He is much better on all fronts.  Back and hip pain much improved, can move well now.   Didn't even have to take medication yesterday.  Since last visit the purpura rash and swelling has resolved.  Feel good today and yesterday.  No new c/o.  He hasn't taken any more Ibuprofen with coumadin. Still on his normal coumadin.   No other aggravating or relieving factors. No other complaint.  Past Medical History:  Diagnosis Date  . Atrial fibrillation ([place])   . Atrial flutter ([place])    a. atypical atrial flutter.  . Bell's palsy   . CAD (coronary artery disease)    a. 07/2009 Cath: LM nl, LAD nl, LCX nl w/ L->R collats, chronically occluded RCA s/p failed PCI;  b. 07/2012 MV: basal inf mild ischemia.  . Central retinal artery occlusion, left eye    diagnosed in 2008  . Cerebrovascular accident [facility])    a. 03/2008 secondary to cardioembolic event;  b. chronic coumadin.  . Chronic diastolic CHF (congestive heart failure) ([place])    a. 12/2008 Echo: nl EF.  [name] Chronic diastolic heart failure ([place]) 01/27/2016  . Diabetes mellitus 11/14/2010   TYPE II  . Hematuria 05/2013   Urology, Dr. Estill Dooms  . Hyperlipidemia   . Hypertension   . Hypogonadism, male 05/2013   Dr. Estill Dooms, Urology  . Morbid obesity ([place])   . Obesity   . Obstructive sleep apnea   . Persistent atrial fibrillation ([place])    a. s/p afib ablation by [facility] 08/01/08  . Recommendation refused by patient    multiple recommended vaccines refused over the years (flu/pneumovax)  . Skin infection    chronic left leg herpetic   Past Surgical History:  Procedure Laterality Date  . ATRIAL ABLATION SURGERY  08/01/08   CTI and PVI ablation by [facility]  . COLONOSCOPY     2012 per patient  . ELECTROPHYSIOLOGIC STUDY N/A 11/30/2014   Procedure: Atrial Fibrillation  Ablation;  Surgeon: [name], MD;  Location: [place] CV LAB;  Service: Cardiovascular;  Laterality: N/A;  . TEE WITHOUT CARDIOVERSION N/A 11/30/2014   Procedure: TRANSESOPHAGEAL ECHOCARDIOGRAM (TEE);  Surgeon: Josue Hector, MD;  Location: [facility] ENDOSCOPY;  Service: Cardiovascular;  Laterality: N/A;   Current Outpatient Prescriptions on File Prior to Visit  Medication Sig Dispense Refill  . ACCU-CHEK FASTCLIX LANCETS MISC Test blood sugar bid. Dx code E11.9 102 each 1  . amLODipine (NORVASC) 10 MG tablet Take 1 tablet (10 mg total) by mouth daily. 90 tablet 3  . atorvastatin (LIPITOR) 80 MG tablet TAKE ONE TABLET BY MOUTH ONCE DAILY 30 tablet 9  . b complex vitamins capsule Take 1 capsule by mouth every other day.     . Cholecalciferol (VITAMIN D3) 10000 UNITS capsule Take 1 capsule (10,000 Units total) by mouth daily. 4 capsule 0  . cyclobenzaprine (FLEXERIL) 10 MG tablet 1/2-1 tablet po QHS prn or up to BID 10 tablet 0  . glucose blood (ACCU-CHEK AVIVA PLUS) test strip 1 each by Other route 2 (two) times daily. Test blood glucose twice daily. Dx Code E11.9 50 each 10  . HYDROcodone-acetaminophen (NORCO) 5-325 MG tablet Take 1 tablet by mouth every 6 (six) hours as needed for moderate pain. 15 tablet 0  .  isosorbide mononitrate (IMDUR) 30 MG 24 hr tablet TAKE ONE TABLET BY MOUTH ONCE DAILY 30 tablet 9  . losartan (COZAAR) 100 MG tablet Take 1 tablet (100 mg total) by mouth daily. 30 tablet 11  . metFORMIN (GLUCOPHAGE) 1000 MG tablet TAKE ONE TABLET BY MOUTH TWICE DAILY WITH A MEAL 180 tablet 3  . metoprolol (LOPRESSOR) 50 MG tablet TAKE ONE TABLET BY MOUTH TWICE DAILY 60 tablet 9  . omega-3 acid ethyl esters (LOVAZA) 1 g capsule take 2 capsules by mouth twice a day 120 capsule 3  . Testosterone (ANDROGEL PUMP) 1.25 GM/ACT (1%) GEL Place 2 application onto the skin daily.     . traMADol (ULTRAM) 50 MG tablet Take 1 tablet (50 mg total) by mouth every 8 (eight) hours as needed. 15 tablet 0   . valACYclovir (VALTREX) 500 MG tablet TAKE ONE CAPLET BY MOUTH ONCE DAILY 90 tablet 1  . warfarin (COUMADIN) 5 MG tablet Take as directed by Coumadin Clinic. 45 tablet 3   No current facility-administered medications on file prior to visit.     ROS as in subjective   Objective: BP 122/70   Pulse 77   Resp 16   Wt 281 lb (127.5 kg)   SpO2 98%   BMI 40.32 kg/m   Gen: wd, wn, nad No rash or skin findings Obese white male Abdomen: +bs, soft, nontender, no mass, no organomegaly Back: back today nontender good ROM, Hips bilat with mildly reduced internal and exercise ROM, but nontender with ROM, legs nontender, no obvious deformity  No LE edema Pedal pulses 1+ Legs with no obvious SLR pain, normal LE strength, sensation, DTRs.    Assessment: Encounter Diagnoses  Name Primary?  . Sciatica, unspecified laterality Yes  . Drug-induced purpura   . Long term current use of anticoagulant therapy      Plan: Glad to hear much improved.  purpura rash and edema resolved.  Back pain 95% back to normal.   Advised if he drives long distances to take frequent stretch breaks.   In general use stretching and core strengthening exercise regularly as discussed.  Can c/t medications prn for back pain.  Advised he not take OTC NSAIDs due to being on coumadin given risks.  F/u prn.    [name] was seen today for follow-up.  Diagnoses and all orders for this visit:  Sciatica, unspecified laterality  Drug-induced purpura  Long term current use of anticoagulant therapy

## 2016-03-12 DIAGNOSIS — N529 Male erectile dysfunction, unspecified: Secondary | ICD-10-CM | POA: Diagnosis not present

## 2016-03-12 DIAGNOSIS — E291 Testicular hypofunction: Secondary | ICD-10-CM | POA: Diagnosis not present

## 2016-03-22 DIAGNOSIS — E291 Testicular hypofunction: Secondary | ICD-10-CM | POA: Diagnosis not present

## 2016-03-22 DIAGNOSIS — R3121 Asymptomatic microscopic hematuria: Secondary | ICD-10-CM | POA: Diagnosis not present

## 2016-03-30 ENCOUNTER — Other Ambulatory Visit: Payer: Self-pay | Admitting: Cardiology

## 2016-04-23 ENCOUNTER — Other Ambulatory Visit: Payer: Self-pay | Admitting: Internal Medicine

## 2016-04-23 ENCOUNTER — Other Ambulatory Visit: Payer: Self-pay | Admitting: Medical

## 2016-04-23 NOTE — Telephone Encounter (Signed)
Is this okay?

## 2016-04-26 ENCOUNTER — Ambulatory Visit (INDEPENDENT_AMBULATORY_CARE_PROVIDER_SITE_OTHER): Payer: Medicare Other | Admitting: Ophthalmology

## 2016-04-26 DIAGNOSIS — H35033 Hypertensive retinopathy, bilateral: Secondary | ICD-10-CM | POA: Diagnosis not present

## 2016-04-26 DIAGNOSIS — H34812 Central retinal vein occlusion, left eye, with macular edema: Secondary | ICD-10-CM

## 2016-04-26 DIAGNOSIS — I10 Essential (primary) hypertension: Secondary | ICD-10-CM

## 2016-04-26 DIAGNOSIS — H43813 Vitreous degeneration, bilateral: Secondary | ICD-10-CM | POA: Diagnosis not present

## 2016-04-28 ENCOUNTER — Other Ambulatory Visit: Payer: Self-pay | Admitting: Internal Medicine

## 2016-05-17 ENCOUNTER — Encounter: Payer: Self-pay | Admitting: Cardiology

## 2016-05-21 ENCOUNTER — Encounter: Payer: Self-pay | Admitting: Cardiology

## 2016-05-21 ENCOUNTER — Ambulatory Visit (INDEPENDENT_AMBULATORY_CARE_PROVIDER_SITE_OTHER): Payer: Medicare Other | Admitting: Cardiology

## 2016-05-21 ENCOUNTER — Ambulatory Visit (INDEPENDENT_AMBULATORY_CARE_PROVIDER_SITE_OTHER): Payer: Medicare Other | Admitting: *Deleted

## 2016-05-21 VITALS — BP 134/80 | HR 82 | Ht 70.0 in | Wt 286.1 lb

## 2016-05-21 DIAGNOSIS — I4891 Unspecified atrial fibrillation: Secondary | ICD-10-CM | POA: Diagnosis not present

## 2016-05-21 DIAGNOSIS — I2510 Atherosclerotic heart disease of native coronary artery without angina pectoris: Secondary | ICD-10-CM | POA: Diagnosis not present

## 2016-05-21 DIAGNOSIS — I4819 Other persistent atrial fibrillation: Secondary | ICD-10-CM

## 2016-05-21 DIAGNOSIS — E782 Mixed hyperlipidemia: Secondary | ICD-10-CM

## 2016-05-21 DIAGNOSIS — I6350 Cerebral infarction due to unspecified occlusion or stenosis of unspecified cerebral artery: Secondary | ICD-10-CM

## 2016-05-21 DIAGNOSIS — I481 Persistent atrial fibrillation: Secondary | ICD-10-CM

## 2016-05-21 DIAGNOSIS — I5032 Chronic diastolic (congestive) heart failure: Secondary | ICD-10-CM | POA: Diagnosis not present

## 2016-05-21 DIAGNOSIS — I10 Essential (primary) hypertension: Secondary | ICD-10-CM | POA: Diagnosis not present

## 2016-05-21 DIAGNOSIS — G4733 Obstructive sleep apnea (adult) (pediatric): Secondary | ICD-10-CM

## 2016-05-21 DIAGNOSIS — Z5181 Encounter for therapeutic drug level monitoring: Secondary | ICD-10-CM

## 2016-05-21 LAB — POCT INR: INR: 3.4

## 2016-05-21 MED ORDER — WARFARIN SODIUM 5 MG PO TABS: ORAL_TABLET | ORAL | 0 refills | Status: DC

## 2016-05-21 NOTE — Patient Instructions (Signed)

## 2016-05-21 NOTE — Progress Notes (Signed)
Cardiology Office Note    Date:  05/21/2016   ID:  [name], DOB 1950/11/08, MRN [id number]  PCP:  Travis Haste, MD  Cardiologist:  Travis Him, MD   Chief Complaint  Patient presents with  . Coronary Artery Disease  . Hypertension  . Sleep Apnea  . Atrial Fibrillation    History of Present Illness:  [name] is a 65 y.o. male with a history of ASCAD, HTN, OSA, morbid obesity, PAF, dyslipidemia who presents today for followup. He is doing well.He is s/p afib ablation by Dr. Rayann Peterson but unable to ablate atypical atrial flutter. He denies any chest pain, SOB, DOE, LE edema, dizziness or syncope.He tolerates his CPAP well. He tolerates the pressure and nasal mask without problems. He feels rested when he gets up and has no daytime sleepiness.  He sleeps well at night.  He has no issues with his device.     Past Medical History:  Diagnosis Date  . Atrial flutter ([place])    a. atypical atrial flutter.  . Bell's palsy   . CAD (coronary artery disease)    a. 07/2009 Cath: LM nl, LAD nl, LCX nl w/ L->R collats, chronically occluded RCA s/p failed PCI;  b. 07/2012 MV: basal inf mild ischemia.  . Central retinal artery occlusion, left eye    diagnosed in 2008  . Cerebrovascular accident [facility])    a. 03/2008 secondary to cardioembolic event;  b. chronic coumadin.  . Chronic diastolic CHF (congestive heart failure) ([place])    a. 12/2008 Echo: nl EF.  . Diabetes mellitus 11/14/2010   TYPE II  . Hematuria 05/2013   Urology, Dr. Estill Peterson  . Hyperlipidemia   . Hypertension   . Hypogonadism, male 05/2013   Dr. Estill Peterson, Urology  . Morbid obesity ([place])   . Obesity   . Obstructive sleep apnea   . Persistent atrial fibrillation ([place])    a. s/p afib ablation by [name] 08/01/08  . Recommendation refused by patient    multiple recommended vaccines refused over the years (flu/pneumovax)  . Skin infection    chronic left leg herpetic    Past Surgical History:  Procedure  Laterality Date  . ATRIAL ABLATION SURGERY  08/01/08   CTI and PVI ablation by [name]  . COLONOSCOPY     2012 per patient  . ELECTROPHYSIOLOGIC STUDY N/A 11/30/2014   Procedure: Atrial Fibrillation Ablation;  Surgeon: Travis Grayer, MD;  Location: [place] [facility];  Service: Cardiovascular;  Laterality: N/A;  . TEE WITHOUT CARDIOVERSION N/A 11/30/2014   Procedure: TRANSESOPHAGEAL ECHOCARDIOGRAM (TEE);  Surgeon: Travis Hector, MD;  Location: [facility];  Service: Cardiovascular;  Laterality: N/A;    Current Medications: Outpatient Medications Prior to Visit  Medication Sig Dispense Refill  . ACCU-CHEK FASTCLIX LANCETS MISC Test blood sugar bid. Dx code E11.9 102 each 1  . amLODipine (NORVASC) 10 MG tablet take 1 tablet once daily 90 tablet 2  . atorvastatin (LIPITOR) 80 MG tablet Take 1 tablet (80 mg total) by mouth daily. 30 tablet 9  . b complex vitamins capsule Take 1 capsule by mouth every other day.     . Cholecalciferol (VITAMIN D3) 10000 UNITS capsule Take 1 capsule (10,000 Units total) by mouth daily. 4 capsule 0  . glucose blood (ACCU-CHEK AVIVA PLUS) test strip 1 each by Other route 2 (two) times daily. Test blood glucose twice daily. Dx Code E11.9 50 each 10  . isosorbide mononitrate (IMDUR) 30 MG 24 hr tablet take 1  tablet by mouth once daily 30 tablet 9  . losartan (COZAAR) 100 MG tablet Take 1 tablet (100 mg total) by mouth daily. 30 tablet 11  . metFORMIN (GLUCOPHAGE) 1000 MG tablet TAKE ONE TABLET BY MOUTH TWICE DAILY WITH A MEAL 180 tablet 3  . metoprolol (LOPRESSOR) 50 MG tablet take 1 tablet by mouth twice a day 60 tablet 9  . omega-3 acid ethyl esters (LOVAZA) 1 g capsule take 2 capsules by mouth twice a day 120 capsule 3  . Testosterone (ANDROGEL PUMP) 1.25 GM/ACT (1%) GEL Place 2 application onto the skin daily.     . valACYclovir (VALTREX) 500 MG tablet take 1 tablet by mouth once daily 90 tablet 1  . warfarin (COUMADIN) 5 MG tablet Take as directed by Coumadin Clinic.  45 tablet 3  . cyclobenzaprine (FLEXERIL) 10 MG tablet 1/2-1 tablet po QHS prn or up to BID (Patient not taking: Reported on 05/21/2016) 10 tablet 0  . HYDROcodone-acetaminophen (NORCO) 5-325 MG tablet Take 1 tablet by mouth every 6 (six) hours as needed for moderate pain. (Patient not taking: Reported on 05/21/2016) 15 tablet 0  . traMADol (ULTRAM) 50 MG tablet Take 1 tablet (50 mg total) by mouth every 8 (eight) hours as needed. (Patient not taking: Reported on 05/21/2016) 15 tablet 0   No facility-administered medications prior to visit.      Allergies:   Crestor [rosuvastatin]; Zetia [ezetimibe]; and Lisinopril   Social History   Social History  . Marital status: Married    Spouse name: N/A  . Number of children: N/A  . Years of education: N/A   Social History Main Topics  . Smoking status: Former Smoker    Quit date: 07/24/1979  . Smokeless tobacco: Never Used  . Alcohol use No     Comment: former  . Drug use: No     Comment: former  . Sexual activity: Not Asked   Other Topics Concern  . None   Social History Narrative   Lives in [place], [place]. With his spouse and son. Has remote history of tobacco, but quit 30 years ago. Has heavy history of alcohol consumption but quit 30 years ago. Previously used marijuana and cocaine, but denies use over the past 30 years. 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      Family History:  The patient's family history includes Heart disease in his father and mother; Stroke in his father.   ROS:   Please see the history of present illness.    ROS All other systems reviewed and are negative.  No flowsheet data found.     PHYSICAL EXAM:   VS:  BP 134/80   Pulse 82   Ht 5\' 10"  (1.778 m)   Wt 286 lb 1.9 oz (129.8 kg)   SpO2 94%   BMI 41.05 kg/m    GEN: Well nourished, well developed, in no acute distress  HEENT: normal  Neck: no JVD, carotid bruits, or masses Cardiac: RRR; no murmurs, rubs, or gallops,no edema.  Intact  distal pulses bilaterally.  Respiratory:  clear to auscultation bilaterally, normal work of breathing GI: soft, nontender, nondistended, + BS MS: no deformity or atrophy  Skin: warm and dry, no rash Neuro:  Alert and Oriented x 3, Strength and sensation are intact Psych: euthymic mood, full affect  Wt Readings from Last 3 Encounters:  05/21/16 286 lb 1.9 oz (129.8 kg)  03/01/16 281 lb (127.5 kg)  02/27/16 286 lb (129.7 kg)  Studies/Labs Reviewed:   EKG:  EKG is not ordered today.  T  Recent Labs: 02/27/2016: ALT 22; BUN 9; Creat 0.88; Hemoglobin 15.2; Platelets 185; Potassium 5.1; Sodium 140   Lipid Panel    Component Value Date/Time   CHOL 153 01/27/2016 0907   TRIG 150 (H) 01/27/2016 0907   HDL 42 01/27/2016 0907   CHOLHDL 3.6 01/27/2016 0907   VLDL 30 01/27/2016 0907   LDLCALC 81 01/27/2016 0907   LDLDIRECT 87.7 05/08/2013 0837    Additional studies/ records that were reviewed today include:  CPAP download    ASSESSMENT:    1. Chronic diastolic heart failure ([place])   2. Coronary artery disease involving native coronary artery of native heart without angina pectoris   3. Persistent atrial fibrillation ([place])   4. Essential hypertension, benign   5. OSA (obstructive sleep apnea)   6. Mixed dyslipidemia      PLAN:  In order of problems listed above:  1. Chronic diastolic CHF - he appears euvolemic on exam today.  Continue BB. 2. ASCAD with chronically occluded RCA with left to right collaterals.  He has no angina. Continue long acting nitrate/BB/statin.  Not on ASA due to warfarin.  3. Persistent atrial fibrillation - maintaining NSR. Continue BB and warfarin.   4. HTN - BP well controlled on current meds.  Continue amlodipine/ARB OSA - the patient is tolerating PAP therapy well without any problems. The PAP download was reviewed today and showed an AHI of 0.6/hr on 16 cm H2O with 100% compliance in using more than 4 hours nightly.  The patient has been using  and benefiting from CPAP use and will continue to benefit from therapy.  Dyslipidemia LDL goal < 70.  Check FLP and ALT.  Continue statin.       Medication Adjustments/Labs and Tests Ordered: Current medicines are reviewed at length with the patient today.  Concerns regarding medicines are outlined above.  Medication changes, Labs and Tests ordered today are listed in the Patient Instructions below.  There are no Patient Instructions on file for this visit.   Signed, Travis Him, MD  05/21/2016 1:52 PM    [facility] [place], [place], [place]  [address] Phone: [phone]; Fax: [phone]

## 2016-05-22 ENCOUNTER — Other Ambulatory Visit: Payer: Medicare Other | Admitting: *Deleted

## 2016-05-22 DIAGNOSIS — E782 Mixed hyperlipidemia: Secondary | ICD-10-CM

## 2016-05-22 DIAGNOSIS — I5032 Chronic diastolic (congestive) heart failure: Secondary | ICD-10-CM

## 2016-05-22 LAB — HEPATIC FUNCTION PANEL
ALT: 27 U/L (ref 9–46)
AST: 20 U/L (ref 10–35)
Albumin: 4.4 g/dL (ref 3.6–5.1)
Alkaline Phosphatase: 55 U/L (ref 40–115)
Bilirubin, Direct: 0.1 mg/dL (ref <=0.2)
Indirect Bilirubin: 0.4 mg/dL (ref 0.2–1.2)
Total Bilirubin: 0.5 mg/dL (ref 0.2–1.2)
Total Protein: 7.2 g/dL (ref 6.1–8.1)

## 2016-05-22 LAB — LIPID PANEL
Cholesterol: 166 mg/dL (ref 125–200)
HDL: 38 mg/dL — ABNORMAL LOW (ref >=40)
LDL Cholesterol: 86 mg/dL (ref <130)
Total CHOL/HDL Ratio: 4.4 Ratio (ref <=5.0)
Triglycerides: 211 mg/dL — ABNORMAL HIGH (ref <150)
VLDL: 42 mg/dL — ABNORMAL HIGH (ref <30)

## 2016-05-22 LAB — BASIC METABOLIC PANEL
BUN: 15 mg/dL (ref 7–25)
CO2: 23 mmol/L (ref 20–31)
Calcium: 9.4 mg/dL (ref 8.6–10.3)
Chloride: 102 mmol/L (ref 98–110)
Creat: 0.94 mg/dL (ref 0.70–1.25)
Glucose, Bld: 166 mg/dL — ABNORMAL HIGH (ref 65–99)
Potassium: 4.1 mmol/L (ref 3.5–5.3)
Sodium: 138 mmol/L (ref 135–146)

## 2016-05-25 ENCOUNTER — Other Ambulatory Visit: Payer: Self-pay | Admitting: Cardiology

## 2016-06-04 ENCOUNTER — Ambulatory Visit (INDEPENDENT_AMBULATORY_CARE_PROVIDER_SITE_OTHER): Payer: Medicare Other | Admitting: *Deleted

## 2016-06-04 DIAGNOSIS — Z5181 Encounter for therapeutic drug level monitoring: Secondary | ICD-10-CM | POA: Diagnosis not present

## 2016-06-04 DIAGNOSIS — I6350 Cerebral infarction due to unspecified occlusion or stenosis of unspecified cerebral artery: Secondary | ICD-10-CM

## 2016-06-04 DIAGNOSIS — I481 Persistent atrial fibrillation: Secondary | ICD-10-CM

## 2016-06-04 DIAGNOSIS — I4891 Unspecified atrial fibrillation: Secondary | ICD-10-CM

## 2016-06-04 DIAGNOSIS — I4819 Other persistent atrial fibrillation: Secondary | ICD-10-CM

## 2016-06-04 LAB — POCT INR: INR: 2.2

## 2016-06-06 ENCOUNTER — Ambulatory Visit (INDEPENDENT_AMBULATORY_CARE_PROVIDER_SITE_OTHER): Payer: Medicare Other | Admitting: Medical

## 2016-06-06 ENCOUNTER — Encounter: Payer: Self-pay | Admitting: Medical

## 2016-06-06 VITALS — BP 146/88 | HR 78 | Wt 283.6 lb

## 2016-06-06 DIAGNOSIS — E559 Vitamin D deficiency, unspecified: Secondary | ICD-10-CM

## 2016-06-06 DIAGNOSIS — I2510 Atherosclerotic heart disease of native coronary artery without angina pectoris: Secondary | ICD-10-CM | POA: Diagnosis not present

## 2016-06-06 DIAGNOSIS — I10 Essential (primary) hypertension: Secondary | ICD-10-CM

## 2016-06-06 DIAGNOSIS — I5032 Chronic diastolic (congestive) heart failure: Secondary | ICD-10-CM

## 2016-06-06 DIAGNOSIS — Z2820 Immunization not carried out because of patient decision for unspecified reason: Secondary | ICD-10-CM | POA: Diagnosis not present

## 2016-06-06 DIAGNOSIS — I4819 Other persistent atrial fibrillation: Secondary | ICD-10-CM

## 2016-06-06 DIAGNOSIS — Z9119 Patient's noncompliance with other medical treatment and regimen: Secondary | ICD-10-CM

## 2016-06-06 DIAGNOSIS — I481 Persistent atrial fibrillation: Secondary | ICD-10-CM

## 2016-06-06 DIAGNOSIS — E118 Type 2 diabetes mellitus with unspecified complications: Secondary | ICD-10-CM

## 2016-06-06 DIAGNOSIS — I483 Typical atrial flutter: Secondary | ICD-10-CM

## 2016-06-06 DIAGNOSIS — G4733 Obstructive sleep apnea (adult) (pediatric): Secondary | ICD-10-CM | POA: Diagnosis not present

## 2016-06-06 DIAGNOSIS — E782 Mixed hyperlipidemia: Secondary | ICD-10-CM | POA: Diagnosis not present

## 2016-06-06 DIAGNOSIS — Z1211 Encounter for screening for malignant neoplasm of colon: Secondary | ICD-10-CM

## 2016-06-06 DIAGNOSIS — Z91199 Patient's noncompliance with other medical treatment and regimen due to unspecified reason: Secondary | ICD-10-CM

## 2016-06-06 LAB — POCT GLYCOSYLATED HEMOGLOBIN (HGB A1C): Hemoglobin A1C: 7.9

## 2016-06-06 MED ORDER — DAPAGLIFLOZIN PROPANEDIOL 10 MG PO TABS: 10.0000 mg | ORAL_TABLET | Freq: Every day | ORAL | 0 refills | Status: DC

## 2016-06-06 NOTE — Addendum Note (Signed)
Addended by: Tyrone Apple on: 06/06/2016 10:27 AM   Modules accepted: Orders

## 2016-06-06 NOTE — Progress Notes (Signed)
Subjective: Chief Complaint  Patient presents with  . med check    med check , no flu shot   Here for diabetes f/u.   [name] has a hx/o chronic heart failure, OSA, diabetes, hypertension, mixed dyslipidemia, morbid obesity, noncompliance with diet and exercise and vaccine recommendations here for routine med check on diabetes.     Diabetes - taking metformin 1000mg  BID.  Not exercising.   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   Diet - no major discretion.   Checks glucose some.   Been running 150-160s.   Does eat some healthy things but also likes nachos, cheese, chips.  Drinking sweet tea again, started back some bad habits.    Mixed dyslipidemia - I reviewed his recent lipid labs through cardiology, LDL >70, so Zetia was added to his Lipitor 80mg  daily. Taking Lovaza 2 capsules BID.  Due fasting labs in another month.    Vitamin d deficiency  OSA - compliant with CPAP, just got a new CPAP as the old one was malfunctioning  HTN - compliant with losartan 100mg  daily, Amlodipine 10mg  daily, metoprolol 50mg  BID  Takes Imdur 30mg  daily  On Coumadin for hx/o afib, managed by coumadin clinic  Hx/o low T, hypogonadism -taking TST therapy at this time through Urology, Dr. Estill Dooms  Colonoscopy last 2010.   Wants to wait after first of the year for colonoscopy.  Past Medical History:  Diagnosis Date  . Atrial flutter ([place])    a. atypical atrial flutter.  . Bell's palsy   . CAD (coronary artery disease)    a. 07/2009 Cath: LM nl, LAD nl, LCX nl w/ L->R collats, chronically occluded RCA s/p failed PCI;  b. 07/2012 MV: basal inf mild ischemia.  . Central retinal artery occlusion, left eye    diagnosed in 2008  . Cerebrovascular accident [facility])    a. 03/2008 secondary to cardioembolic event;  b. chronic coumadin.  . Chronic diastolic CHF (congestive heart failure) ([place])    a. 12/2008 Echo: nl EF.  . Diabetes mellitus 11/14/2010   TYPE II  . Hematuria 05/2013    Urology, Dr. Estill Dooms  . Hyperlipidemia   . Hypertension   . Hypogonadism, male 05/2013   Dr. Estill Dooms, Urology  . Morbid obesity ([place])   . Obesity   . Obstructive sleep apnea   . Persistent atrial fibrillation ([place])    a. s/p afib ablation by [facility] 08/01/08  . Recommendation refused by patient    multiple recommended vaccines refused over the years (flu/pneumovax)  . Skin infection    chronic left leg herpetic   Current Outpatient Prescriptions on File Prior to Visit  Medication Sig Dispense Refill  . ACCU-CHEK FASTCLIX LANCETS MISC Test blood sugar bid. Dx code E11.9 102 each 1  . amLODipine (NORVASC) 10 MG tablet take 1 tablet once daily 90 tablet 2  . atorvastatin (LIPITOR) 80 MG tablet Take 1 tablet (80 mg total) by mouth daily. 30 tablet 9  . b complex vitamins capsule Take 1 capsule by mouth every other day.     . Cholecalciferol (VITAMIN D3) 10000 UNITS capsule Take 1 capsule (10,000 Units total) by mouth daily. 4 capsule 0  . glucose blood (ACCU-CHEK AVIVA PLUS) test strip 1 each by Other route 2 (two) times daily. Test blood glucose twice daily. Dx Code E11.9 50 each 10  . isosorbide mononitrate (IMDUR) 30 MG 24 hr tablet take 1 tablet by mouth once daily 30 tablet  9  . metFORMIN (GLUCOPHAGE) 1000 MG tablet TAKE ONE TABLET BY MOUTH TWICE DAILY WITH A MEAL 180 tablet 3  . metoprolol (LOPRESSOR) 50 MG tablet take 1 tablet by mouth twice a day 60 tablet 9  . omega-3 acid ethyl esters (LOVAZA) 1 g capsule take 2 capsules by mouth twice a day 120 capsule 3  . Testosterone (ANDROGEL PUMP) 1.25 GM/ACT (1%) GEL Place 2 application onto the skin daily.     . valACYclovir (VALTREX) 500 MG tablet take 1 tablet by mouth once daily 90 tablet 1  . warfarin (COUMADIN) 5 MG tablet Take as directed by Coumadin Clinic. 45 tablet 0  . losartan (COZAAR) 100 MG tablet Take 1 tablet (100 mg total) by mouth daily. (Patient not taking: Reported on 06/06/2016) 30 tablet 11   No current  facility-administered medications on file prior to visit.    ROS as in subjective    Objective: BP (!) 146/88   Pulse 78   Wt 283 lb 9.6 oz (128.6 kg)   SpO2 96%   BMI 40.69 kg/m   BP Readings from Last 3 Encounters:  06/06/16 (!) 146/88  05/21/16 134/80  03/01/16 122/70   Wt Readings from Last 3 Encounters:  06/06/16 283 lb 9.6 oz (128.6 kg)  05/21/16 286 lb 1.9 oz (129.8 kg)  03/01/16 281 lb (127.5 kg)   06/2014 weight of 269lb 06/2011 weight of 304lb  General appearance: alert, no distress, WD/WN Neck: supple, no lymphadenopathy, no thyromegaly, no masses, no bruits Heart: RRR, normal S1, S2, no murmurs Lungs: CTA bilaterally, no wheezes, rhonchi, or rales Abdomen: +bs, soft, non tender, non distended, no masses, no hepatomegaly, no splenomegaly Extremities: no edema, no cyanosis, no clubbing Pulses: 2+ symmetric, upper and lower extremities, normal cap refill Neurological: left facial weakness chronic since B[place], otherwise alert, oriented x 3, CN2-12 intact, strength normal upper extremities and lower extremities, sensation normal throughout, DTRs 2+ throughout, no cerebellar signs, gait normal Psychiatric: normal affect, behavior normal, pleasant   Diabetic Foot Exam - Simple   Simple Foot Form Diabetic Foot exam was performed with the following findings:  Yes 06/06/2016  9:02 AM  Visual Inspection No deformities, no ulcerations, no other skin breakdown bilaterally:  Yes Sensation Testing Intact to touch and monofilament testing bilaterally:  Yes Pulse Check Posterior Tibialis and Dorsalis pulse intact bilaterally:  Yes Comments       Assessment: Encounter Diagnoses  Name Primary?  . Diabetes mellitus with complication ([place]) Yes  . Morbid obesity ([place])   . Mixed dyslipidemia   . Chronic diastolic heart failure ([place])   . OSA (obstructive sleep apnea)   . Coronary artery disease involving native coronary artery of native heart without angina pectoris    . Typical atrial flutter ([place])   . Essential hypertension, benign   . Persistent atrial fibrillation ([place])   . Vaccine refused by patient   . Noncompliance   . Vitamin D deficiency      Plan: Spent good deal of time today discussed diet, exercise, and he is noncompliant with both Likewise he is noncompliant, not willing to do any vaccines Reviewed his recent cardiology notes.   They were going to add zetia, but he has not tolerated this in the past.  Thus, c/t Lipitor, Lovaza, and make necessary diet changes.  Offered Praluent today but he declines, says he is willing to work on diet Diabetes - c/t glucose monitoring, c/t metformin, but add [place]. discuses risks/benefits of medication OSA -  c/t CPAP He sees urology for prostate monitoring and TST therapy Discussed goals of therapy for diabetes, heart disease and a large part of this depends upon his efforts with diet, exercise.  Recommendations:  Begin Farxiga 5mg  samples daily, then after a week, increase to [place] 10mg  daily  Continue Metformin 1000mg  twice daily  continue all other medications the same  Exercise with walking at the mall every day for 45 minutes  [name] was seen today for med check.  Diagnoses and all orders for this visit:  Diabetes mellitus with complication ([place]) -     HM DIABETES FOOT EXAM -     HM DIABETES EYE EXAM -     POCT glycosylated hemoglobin (Hb A1C) -     Microalbumin / creatinine urine ratio  Morbid obesity ([facility])  Mixed dyslipidemia  Chronic diastolic heart failure ([facility])  OSA (obstructive sleep apnea)  Coronary artery disease involving native coronary artery of native heart without angina pectoris  Typical atrial flutter ([facility])  Essential hypertension, benign  Persistent atrial fibrillation ([facility])  Vaccine refused by patient  Noncompliance  Vitamin D deficiency -     VITAMIN D 25 Hydroxy (Vit-D Deficiency, Fractures)  Other orders -     dapagliflozin propanediol  (FARXIGA) 10 MG TABS tablet; Take 10 mg by mouth daily.

## 2016-06-06 NOTE — Patient Instructions (Signed)
Recommendations:  Begin Farxiga 5mg  samples daily, then after a week, increase to [place] 10mg  daily  Continue Metformin 1000mg  twice daily  continue all other medications the same  Exercise with walking at the mall every day for 45 minutes  I encourage you to be a healthier [place]!!!   Thank you for giving me the opportunity to serve you today.    Your diagnosis today includes: Encounter Diagnoses  Name Primary?  . Diabetes mellitus with complication ([place]) Yes  . Morbid obesity ([place])   . Mixed dyslipidemia   . Chronic diastolic heart failure ([place])   . OSA (obstructive sleep apnea)   . Coronary artery disease involving native coronary artery of native heart without angina pectoris   . Typical atrial flutter ([place])   . Essential hypertension, benign   . Persistent atrial fibrillation ([place])   . Vaccine refused by patient   . Noncompliance   . Vitamin D deficiency      Specific recommendations today include: Diet  Increase your water intake, get at least 64 ounces of water daily  Eat 3-4 fruits daily  Eat plenty of vegetables throughout the day, preferably each meal  Eat good sources of grains such as oatmeal, barley, whole grain pasta, whole grain bread, but limit the serving size to 1 cup of oatmeal or pasta per meal or 2 slices of bread per meal  We don't need to meat at each meal, however if you do eat meat, limit serving size to the size of your palm, and eat chicken fish or [place], lean cuts of meat  Eat beans every day as this is a good nutrient source and helps to curb appetite  Consider using a program such as Weight Watchers  Consider using a Smart phone app such as My Fitness [employer] or [employer] to track your calories and progress   Things to limit or avoid:  Avoid fast food, fried foods, fatty foods  Limit sweets, ice cream, cake and other baked goods  Avoid soda, beer, alcohol, sweet tea

## 2016-06-07 LAB — MICROALBUMIN / CREATININE URINE RATIO
Creatinine, Urine: 165 mg/dL (ref 20–370)
Microalb Creat Ratio: 69 mcg/mg creat — ABNORMAL HIGH (ref <30)
Microalb, Ur: 11.4 mg/dL

## 2016-06-07 LAB — VITAMIN D 25 HYDROXY (VIT D DEFICIENCY, FRACTURES): Vit D, 25-Hydroxy: 34 ng/mL (ref 30–100)

## 2016-06-25 ENCOUNTER — Ambulatory Visit (INDEPENDENT_AMBULATORY_CARE_PROVIDER_SITE_OTHER): Payer: Medicare Other

## 2016-06-25 DIAGNOSIS — I481 Persistent atrial fibrillation: Secondary | ICD-10-CM

## 2016-06-25 DIAGNOSIS — I4819 Other persistent atrial fibrillation: Secondary | ICD-10-CM

## 2016-06-25 DIAGNOSIS — Z5181 Encounter for therapeutic drug level monitoring: Secondary | ICD-10-CM | POA: Diagnosis not present

## 2016-06-25 DIAGNOSIS — I4891 Unspecified atrial fibrillation: Secondary | ICD-10-CM | POA: Diagnosis not present

## 2016-06-25 DIAGNOSIS — I6350 Cerebral infarction due to unspecified occlusion or stenosis of unspecified cerebral artery: Secondary | ICD-10-CM | POA: Diagnosis not present

## 2016-06-26 ENCOUNTER — Other Ambulatory Visit: Payer: Self-pay | Admitting: Cardiology

## 2016-07-02 ENCOUNTER — Other Ambulatory Visit: Payer: Self-pay | Admitting: Medical

## 2016-07-05 ENCOUNTER — Telehealth: Payer: Self-pay | Admitting: Medical

## 2016-07-05 NOTE — Telephone Encounter (Signed)
P.A. FARXIGA 

## 2016-07-23 NOTE — Telephone Encounter (Signed)
Wilder Glade approved til 07/22/17

## 2016-07-25 ENCOUNTER — Other Ambulatory Visit: Payer: Self-pay | Admitting: Internal Medicine

## 2016-07-25 ENCOUNTER — Ambulatory Visit (INDEPENDENT_AMBULATORY_CARE_PROVIDER_SITE_OTHER): Payer: Medicare Other | Admitting: *Deleted

## 2016-07-25 DIAGNOSIS — Z5181 Encounter for therapeutic drug level monitoring: Secondary | ICD-10-CM | POA: Diagnosis not present

## 2016-07-25 DIAGNOSIS — I481 Persistent atrial fibrillation: Secondary | ICD-10-CM | POA: Diagnosis not present

## 2016-07-25 DIAGNOSIS — I6350 Cerebral infarction due to unspecified occlusion or stenosis of unspecified cerebral artery: Secondary | ICD-10-CM | POA: Diagnosis not present

## 2016-07-25 DIAGNOSIS — I4891 Unspecified atrial fibrillation: Secondary | ICD-10-CM

## 2016-07-25 DIAGNOSIS — I4819 Other persistent atrial fibrillation: Secondary | ICD-10-CM

## 2016-07-25 LAB — POCT INR: INR: 2.6

## 2016-07-25 NOTE — Telephone Encounter (Signed)
PCP should refill  Chanetta Marshall, NP 07/25/2016 12:09 PM

## 2016-07-26 NOTE — Telephone Encounter (Signed)
Called pharmacy & Pt informed  

## 2016-07-28 ENCOUNTER — Other Ambulatory Visit: Payer: Self-pay | Admitting: Internal Medicine

## 2016-07-30 ENCOUNTER — Telehealth: Payer: Self-pay | Admitting: Pharmacist

## 2016-07-30 DIAGNOSIS — I4891 Unspecified atrial fibrillation: Secondary | ICD-10-CM | POA: Diagnosis not present

## 2016-07-30 DIAGNOSIS — Z86010 Personal history of colonic polyps: Secondary | ICD-10-CM | POA: Diagnosis not present

## 2016-07-30 DIAGNOSIS — R197 Diarrhea, unspecified: Secondary | ICD-10-CM | POA: Diagnosis not present

## 2016-07-30 NOTE — Telephone Encounter (Signed)
Received fax [place] from [facility] that pt is scheduled for a colonoscopy with Dr Benson Norway on 08/21/16. Pt is on Coumadin for afib with CHADS2 score of 5 (CVA, HF, DM, and HTN). Pt will require a Lovenox bridge prior to colonoscopy, to be arranged by Coumadin clinic. Clearance faxed to Dr Ulyses Amor office [phone].

## 2016-08-14 ENCOUNTER — Ambulatory Visit (INDEPENDENT_AMBULATORY_CARE_PROVIDER_SITE_OTHER): Payer: Medicare Other | Admitting: *Deleted

## 2016-08-14 DIAGNOSIS — Z5181 Encounter for therapeutic drug level monitoring: Secondary | ICD-10-CM | POA: Diagnosis not present

## 2016-08-14 DIAGNOSIS — I4891 Unspecified atrial fibrillation: Secondary | ICD-10-CM | POA: Diagnosis not present

## 2016-08-14 DIAGNOSIS — I6350 Cerebral infarction due to unspecified occlusion or stenosis of unspecified cerebral artery: Secondary | ICD-10-CM

## 2016-08-14 DIAGNOSIS — I481 Persistent atrial fibrillation: Secondary | ICD-10-CM

## 2016-08-14 DIAGNOSIS — I4819 Other persistent atrial fibrillation: Secondary | ICD-10-CM

## 2016-08-14 LAB — POCT INR: INR: 2.5

## 2016-08-14 MED ORDER — ENOXAPARIN SODIUM 120 MG/0.8ML ~~LOC~~ SOLN: 120.0000 mg | Freq: Two times a day (BID) | SUBCUTANEOUS | 1 refills | Status: DC

## 2016-08-14 NOTE — Patient Instructions (Addendum)
08/15/16: Last dose of Coumadin.  08/16/16: No Coumadin or Lovenox.  08/17/16: Inject Lovenox 120mg  in the fatty abdominal tissue at least 2 inches from the belly button twice a day about 12 hours apart, 8am and 8pm rotate sites. No Coumadin.  08/18/16: Inject Lovenox in the fatty tissue every 12 hours, 8am and 8pm. No Coumadin.  08/19/16: Inject Lovenox in the fatty tissue every 12 hours, 8am and 8pm. No Coumadin.  08/20/16: Inject Lovenox in the fatty tissue in the morning at 8 am (No PM dose). No Coumadin.  08/21/16: Procedure Day - No Lovenox - Resume Coumadin in the evening or as directed by doctor (take an extra half tablet with usual dose for 2 days then resume normal dose).  08/22/16: Resume Lovenox inject in the fatty tissue every 12 hours and take Coumadin.  08/23/16: Inject Lovenox in the fatty tissue every 12 hours and take Coumadin.  08/24/16: Inject Lovenox in the fatty tissue every 12 hours and take Coumadin.  08/25/16: Inject Lovenox in the fatty tissue every 12 hours and take Coumadin.  08/26/16: Inject Lovenox in the fatty tissue every 12 hours and take Coumadin.  08/27/16: Inject AM dose of Lovenox then report to Coumadin appt to check INR.

## 2016-08-21 DIAGNOSIS — Z86010 Personal history of colonic polyps: Secondary | ICD-10-CM | POA: Diagnosis not present

## 2016-08-21 DIAGNOSIS — D124 Benign neoplasm of descending colon: Secondary | ICD-10-CM | POA: Diagnosis not present

## 2016-08-21 DIAGNOSIS — K635 Polyp of colon: Secondary | ICD-10-CM | POA: Diagnosis not present

## 2016-08-21 DIAGNOSIS — D126 Benign neoplasm of colon, unspecified: Secondary | ICD-10-CM | POA: Insufficient documentation

## 2016-08-21 DIAGNOSIS — Z1211 Encounter for screening for malignant neoplasm of colon: Secondary | ICD-10-CM | POA: Diagnosis not present

## 2016-08-21 DIAGNOSIS — R197 Diarrhea, unspecified: Secondary | ICD-10-CM | POA: Diagnosis not present

## 2016-08-21 DIAGNOSIS — D122 Benign neoplasm of ascending colon: Secondary | ICD-10-CM | POA: Diagnosis not present

## 2016-08-21 DIAGNOSIS — K6389 Other specified diseases of intestine: Secondary | ICD-10-CM | POA: Diagnosis not present

## 2016-08-21 DIAGNOSIS — D123 Benign neoplasm of transverse colon: Secondary | ICD-10-CM | POA: Diagnosis not present

## 2016-08-23 LAB — HM COLONOSCOPY

## 2016-08-25 ENCOUNTER — Other Ambulatory Visit: Payer: Self-pay | Admitting: Internal Medicine

## 2016-08-27 ENCOUNTER — Ambulatory Visit (INDEPENDENT_AMBULATORY_CARE_PROVIDER_SITE_OTHER): Payer: Medicare Other | Admitting: *Deleted

## 2016-08-27 DIAGNOSIS — I6350 Cerebral infarction due to unspecified occlusion or stenosis of unspecified cerebral artery: Secondary | ICD-10-CM

## 2016-08-27 DIAGNOSIS — I4819 Other persistent atrial fibrillation: Secondary | ICD-10-CM

## 2016-08-27 DIAGNOSIS — I4891 Unspecified atrial fibrillation: Secondary | ICD-10-CM

## 2016-08-27 DIAGNOSIS — I481 Persistent atrial fibrillation: Secondary | ICD-10-CM

## 2016-08-27 DIAGNOSIS — Z5181 Encounter for therapeutic drug level monitoring: Secondary | ICD-10-CM

## 2016-08-27 LAB — POCT INR: INR: 1.5

## 2016-08-31 ENCOUNTER — Ambulatory Visit (INDEPENDENT_AMBULATORY_CARE_PROVIDER_SITE_OTHER): Payer: Medicare Other | Admitting: Pharmacist

## 2016-08-31 DIAGNOSIS — I6350 Cerebral infarction due to unspecified occlusion or stenosis of unspecified cerebral artery: Secondary | ICD-10-CM | POA: Diagnosis not present

## 2016-08-31 DIAGNOSIS — I4891 Unspecified atrial fibrillation: Secondary | ICD-10-CM

## 2016-08-31 DIAGNOSIS — I4819 Other persistent atrial fibrillation: Secondary | ICD-10-CM

## 2016-08-31 DIAGNOSIS — Z5181 Encounter for therapeutic drug level monitoring: Secondary | ICD-10-CM | POA: Diagnosis not present

## 2016-08-31 DIAGNOSIS — I481 Persistent atrial fibrillation: Secondary | ICD-10-CM

## 2016-08-31 LAB — POCT INR: INR: 2

## 2016-09-01 ENCOUNTER — Other Ambulatory Visit: Payer: Self-pay | Admitting: Internal Medicine

## 2016-09-03 NOTE — Telephone Encounter (Signed)
Pharmacy requesting a refill on Metformin. Would you like to refill this medication? Please advise

## 2016-09-06 ENCOUNTER — Ambulatory Visit (INDEPENDENT_AMBULATORY_CARE_PROVIDER_SITE_OTHER): Payer: Medicare Other | Admitting: Medical

## 2016-09-06 ENCOUNTER — Encounter: Payer: Self-pay | Admitting: Medical

## 2016-09-06 VITALS — BP 128/80 | HR 74 | Wt 282.4 lb

## 2016-09-06 DIAGNOSIS — G4733 Obstructive sleep apnea (adult) (pediatric): Secondary | ICD-10-CM

## 2016-09-06 DIAGNOSIS — E559 Vitamin D deficiency, unspecified: Secondary | ICD-10-CM

## 2016-09-06 DIAGNOSIS — E118 Type 2 diabetes mellitus with unspecified complications: Secondary | ICD-10-CM | POA: Diagnosis not present

## 2016-09-06 DIAGNOSIS — T887XXA Unspecified adverse effect of drug or medicament, initial encounter: Secondary | ICD-10-CM

## 2016-09-06 DIAGNOSIS — E782 Mixed hyperlipidemia: Secondary | ICD-10-CM

## 2016-09-06 DIAGNOSIS — T50905A Adverse effect of unspecified drugs, medicaments and biological substances, initial encounter: Secondary | ICD-10-CM

## 2016-09-06 DIAGNOSIS — I2510 Atherosclerotic heart disease of native coronary artery without angina pectoris: Secondary | ICD-10-CM | POA: Diagnosis not present

## 2016-09-06 DIAGNOSIS — I10 Essential (primary) hypertension: Secondary | ICD-10-CM

## 2016-09-06 LAB — COMPREHENSIVE METABOLIC PANEL
ALT: 29 U/L (ref 9–46)
AST: 19 U/L (ref 10–35)
Albumin: 4.2 g/dL (ref 3.6–5.1)
Alkaline Phosphatase: 66 U/L (ref 40–115)
BUN: 12 mg/dL (ref 7–25)
CO2: 25 mmol/L (ref 20–31)
Calcium: 9.2 mg/dL (ref 8.6–10.3)
Chloride: 101 mmol/L (ref 98–110)
Creat: 0.9 mg/dL (ref 0.70–1.25)
Glucose, Bld: 172 mg/dL — ABNORMAL HIGH (ref 65–99)
Potassium: 4.7 mmol/L (ref 3.5–5.3)
Sodium: 137 mmol/L (ref 135–146)
Total Bilirubin: 0.6 mg/dL (ref 0.2–1.2)
Total Protein: 7.3 g/dL (ref 6.1–8.1)

## 2016-09-06 LAB — LIPID PANEL
Cholesterol: 174 mg/dL (ref <200)
HDL: 42 mg/dL (ref >40)
LDL Cholesterol: 88 mg/dL (ref <100)
Total CHOL/HDL Ratio: 4.1 Ratio (ref <5.0)
Triglycerides: 219 mg/dL — ABNORMAL HIGH (ref <150)
VLDL: 44 mg/dL — ABNORMAL HIGH (ref <30)

## 2016-09-06 NOTE — Progress Notes (Signed)
Subjective: Chief Complaint  Patient presents with  . med check    med check ,had a reaction to meds    Here for diabetes f/u.  Since last visit he started [place], but every day he took it made his heart feel funny, worried it was going to trigger Afib.   He stopped taking it after several days.   He refuses to try it anymore.   Otherwise been in usual state of health.    Mr. Briner has a hx/o chronic heart failure, OSA, diabetes, hypertension, mixed dyslipidemia, morbid obesity, noncompliance with diet and exercise and vaccine recommendations here for med check on diabetes.     Diabetes - taking metformin 1000mg  BID.  Getting some exercise running after his 10yo son.  Diet - no major discretion.   Checks glucose some.   Been running 150-160s.      Mixed dyslipidemia -taking Lipitor 80mg  daily. Taking Lovaza 2 capsules BID.    OSA - compliant with CPAP  HTN - compliant with losartan 100mg  daily, Amlodipine 10mg  daily, metoprolol 50mg  BID  On Coumadin for hx/o afib, managed by coumadin clinic  Hx/o low T, hypogonadism -taking TST therapy at this time through Urology, Dr. Estill Dooms  Since last visit had colonoscopy with Dr. Benson [place].   Past Medical History:  Diagnosis Date  . Atrial flutter ([place])    a. atypical atrial flutter.  . Bell's palsy   . CAD (coronary artery disease)    a. 07/2009 Cath: LM nl, LAD nl, LCX nl w/ L->R collats, chronically occluded RCA s/p failed PCI;  b. 07/2012 MV: basal inf mild ischemia.  . Central retinal artery occlusion, left eye    diagnosed in 2008  . Cerebrovascular accident [facility])    a. 03/2008 secondary to cardioembolic event;  b. chronic coumadin.  . Chronic diastolic CHF (congestive heart failure) ([place])    a. 12/2008 Echo: nl EF.  . Diabetes mellitus 11/14/2010   TYPE II  . Hematuria 05/2013   Urology, Dr. Estill Dooms  . Hyperlipidemia   . Hypertension   . Hypogonadism, male 05/2013   Dr. Estill Dooms, Urology  . Morbid obesity ([place])   . Obesity   .  Obstructive sleep apnea   . Persistent atrial fibrillation ([place])    a. s/p afib ablation by [facility] 08/01/08  . Recommendation refused by patient    multiple recommended vaccines refused over the years (flu/pneumovax)  . Skin infection    chronic left leg herpetic   Current Outpatient Prescriptions on File Prior to Visit  Medication Sig Dispense Refill  . ACCU-CHEK FASTCLIX LANCETS MISC Test blood sugar bid. Dx code E11.9 102 each 1  . amLODipine (NORVASC) 10 MG tablet take 1 tablet once daily 90 tablet 2  . atorvastatin (LIPITOR) 80 MG tablet Take 1 tablet (80 mg total) by mouth daily. 30 tablet 9  . b complex vitamins capsule Take 1 capsule by mouth every other day.     . Cholecalciferol (VITAMIN D3) 10000 UNITS capsule Take 1 capsule (10,000 Units total) by mouth daily. 4 capsule 0  . glucose blood (ACCU-CHEK AVIVA PLUS) test strip 1 each by Other route 2 (two) times daily. Test blood glucose twice daily. Dx Code E11.9 50 each 10  . isosorbide mononitrate (IMDUR) 30 MG 24 hr tablet take 1 tablet by mouth once daily 30 tablet 9  . losartan (COZAAR) 100 MG tablet Take 1 tablet (100 mg total) by mouth daily. 30 tablet 11  . metFORMIN (GLUCOPHAGE) 1000 MG  tablet TAKE ONE TABLET BY MOUTH TWICE DAILY WITH A MEAL 180 tablet 3  . metoprolol (LOPRESSOR) 50 MG tablet take 1 tablet by mouth twice a day 60 tablet 9  . omega-3 acid ethyl esters (LOVAZA) 1 g capsule take 2 capsules by mouth twice a day 120 capsule 3  . Testosterone (ANDROGEL PUMP) 1.25 GM/ACT (1%) GEL Place 2 application onto the skin daily.     . valACYclovir (VALTREX) 500 MG tablet take 1 tablet by mouth once daily 90 tablet 1  . warfarin (COUMADIN) 5 MG tablet TAKE AS DIRECTED BY COUMADIN CLINIC 45 tablet 3   No current facility-administered medications on file prior to visit.    ROS as in subjective    Objective: BP 128/80   Pulse 74   Wt 282 lb 6.4 oz (128.1 kg)   SpO2 96%   BMI 40.52 kg/m   BP Readings from Last 3  Encounters:  09/06/16 128/80  06/06/16 (!) 146/88  05/21/16 134/80   Wt Readings from Last 3 Encounters:  09/06/16 282 lb 6.4 oz (128.1 kg)  06/06/16 283 lb 9.6 oz (128.6 kg)  05/21/16 286 lb 1.9 oz (129.8 kg)   General appearance: alert, no distress, WD/WN Neck: supple, no lymphadenopathy, no thyromegaly, no masses, no bruits Heart: RRR, normal S1, S2, no murmurs Lungs: CTA bilaterally, no wheezes, rhonchi, or rales Extremities: no edema, no cyanosis, no clubbing Pulses: 2+ symmetric, upper and lower extremities, normal cap refill Neurological: left facial weakness chronic since B[place], otherwise alert, oriented x 3, CN2-12 intact, strength normal upper extremities and lower extremities, sensation normal throughout, DTRs 2+ throughout, no cerebellar signs, gait normal Psychiatric: normal affect, behavior normal, pleasant     Assessment: Encounter Diagnoses  Name Primary?  . Essential hypertension, benign Yes  . Diabetes mellitus with complication ([place])   . Mixed dyslipidemia   . OSA (obstructive sleep apnea)   . Coronary artery disease involving native coronary artery of native heart without angina pectoris   . Morbid obesity ([place])   . Vitamin D deficiency   . Medication side effect, initial encounter      Plan: Labs today.  Updated allergy list to include Farxiga.  Pending labs, consider med changes for lipids and diabetes.  F/u as planned with cardiology counseled on diet, exercise.  OSA - c/t CPAP Discussed goals of therapy for diabetes, heart disease and a large part of this depends upon his efforts with diet, exercise.  [place] was seen today for med check.  Diagnoses and all orders for this visit:  Essential hypertension, benign -     Comprehensive metabolic panel -     Lipid panel -     Hemoglobin A1c  Diabetes mellitus with complication (HCC) -     Comprehensive metabolic panel -     Lipid panel -     Hemoglobin A1c  Mixed dyslipidemia -      Comprehensive metabolic panel -     Lipid panel -     Hemoglobin A1c  OSA (obstructive sleep apnea)  Coronary artery disease involving native coronary artery of native heart without angina pectoris  Morbid obesity (HCC)  Vitamin D deficiency  Medication side effect, initial encounter

## 2016-09-07 ENCOUNTER — Other Ambulatory Visit: Payer: Self-pay | Admitting: Medical

## 2016-09-07 LAB — HEMOGLOBIN A1C
Hgb A1c MFr Bld: 7.1 % — ABNORMAL HIGH (ref <5.7)
Mean Plasma Glucose: 157 mg/dL

## 2016-09-07 MED ORDER — AMLODIPINE BESYLATE 10 MG PO TABS: 10.0000 mg | ORAL_TABLET | Freq: Every day | ORAL | 1 refills | Status: DC

## 2016-09-07 MED ORDER — OMEGA-3-ACID ETHYL ESTERS 1 G PO CAPS: 2.0000 | ORAL_CAPSULE | Freq: Two times a day (BID) | ORAL | 3 refills | Status: DC

## 2016-09-07 MED ORDER — ATORVASTATIN CALCIUM 80 MG PO TABS: 80.0000 mg | ORAL_TABLET | Freq: Every day | ORAL | 3 refills | Status: DC

## 2016-09-07 MED ORDER — SAXAGLIPTIN-METFORMIN ER 5-1000 MG PO TB24: 1.0000 | ORAL_TABLET | Freq: Every day | ORAL | 0 refills | Status: DC

## 2016-09-08 ENCOUNTER — Telehealth: Payer: Self-pay | Admitting: Medical

## 2016-09-08 NOTE — Telephone Encounter (Signed)
P.A. KOMBIGLYZE  °

## 2016-09-10 ENCOUNTER — Other Ambulatory Visit: Payer: Self-pay | Admitting: Internal Medicine

## 2016-09-12 ENCOUNTER — Other Ambulatory Visit: Payer: Self-pay | Admitting: Medical

## 2016-09-12 ENCOUNTER — Other Ambulatory Visit: Payer: Self-pay | Admitting: Internal Medicine

## 2016-09-14 ENCOUNTER — Encounter: Payer: Self-pay | Admitting: Family Medicine

## 2016-09-19 NOTE — Telephone Encounter (Signed)
P.A. Approved til 07/22/17, went thru for $3.70 at pharmacy, left message for pt

## 2016-09-23 ENCOUNTER — Other Ambulatory Visit: Payer: Self-pay | Admitting: Cardiology

## 2016-09-28 ENCOUNTER — Ambulatory Visit (INDEPENDENT_AMBULATORY_CARE_PROVIDER_SITE_OTHER): Payer: Medicare Other

## 2016-09-28 DIAGNOSIS — I4891 Unspecified atrial fibrillation: Secondary | ICD-10-CM

## 2016-09-28 DIAGNOSIS — I481 Persistent atrial fibrillation: Secondary | ICD-10-CM

## 2016-09-28 DIAGNOSIS — I4819 Other persistent atrial fibrillation: Secondary | ICD-10-CM

## 2016-09-28 DIAGNOSIS — I6350 Cerebral infarction due to unspecified occlusion or stenosis of unspecified cerebral artery: Secondary | ICD-10-CM | POA: Diagnosis not present

## 2016-09-28 DIAGNOSIS — Z5181 Encounter for therapeutic drug level monitoring: Secondary | ICD-10-CM

## 2016-09-28 LAB — POCT INR: INR: 2.4

## 2016-10-25 ENCOUNTER — Other Ambulatory Visit: Payer: Self-pay | Admitting: Internal Medicine

## 2016-10-25 ENCOUNTER — Other Ambulatory Visit: Payer: Self-pay | Admitting: Cardiology

## 2016-10-26 ENCOUNTER — Ambulatory Visit (INDEPENDENT_AMBULATORY_CARE_PROVIDER_SITE_OTHER): Payer: Medicare Other

## 2016-10-26 DIAGNOSIS — I481 Persistent atrial fibrillation: Secondary | ICD-10-CM

## 2016-10-26 DIAGNOSIS — I4819 Other persistent atrial fibrillation: Secondary | ICD-10-CM

## 2016-10-26 DIAGNOSIS — I6350 Cerebral infarction due to unspecified occlusion or stenosis of unspecified cerebral artery: Secondary | ICD-10-CM | POA: Diagnosis not present

## 2016-10-26 DIAGNOSIS — I4891 Unspecified atrial fibrillation: Secondary | ICD-10-CM

## 2016-10-26 DIAGNOSIS — Z5181 Encounter for therapeutic drug level monitoring: Secondary | ICD-10-CM

## 2016-10-26 LAB — POCT INR: INR: 2.2

## 2016-11-21 ENCOUNTER — Other Ambulatory Visit: Payer: Self-pay | Admitting: Medical

## 2016-12-03 ENCOUNTER — Ambulatory Visit: Payer: Medicare Other | Admitting: Medical

## 2016-12-06 ENCOUNTER — Ambulatory Visit: Payer: Medicare Other | Admitting: Cardiology

## 2016-12-19 ENCOUNTER — Ambulatory Visit (INDEPENDENT_AMBULATORY_CARE_PROVIDER_SITE_OTHER): Payer: Medicare Other | Admitting: Medical

## 2016-12-19 ENCOUNTER — Encounter: Payer: Self-pay | Admitting: Medical

## 2016-12-19 VITALS — BP 138/82 | HR 74 | Wt 284.2 lb

## 2016-12-19 DIAGNOSIS — E118 Type 2 diabetes mellitus with unspecified complications: Secondary | ICD-10-CM

## 2016-12-19 DIAGNOSIS — I6350 Cerebral infarction due to unspecified occlusion or stenosis of unspecified cerebral artery: Secondary | ICD-10-CM | POA: Diagnosis not present

## 2016-12-19 DIAGNOSIS — Z2820 Immunization not carried out because of patient decision for unspecified reason: Secondary | ICD-10-CM | POA: Diagnosis not present

## 2016-12-19 DIAGNOSIS — I10 Essential (primary) hypertension: Secondary | ICD-10-CM

## 2016-12-19 DIAGNOSIS — E782 Mixed hyperlipidemia: Secondary | ICD-10-CM

## 2016-12-19 DIAGNOSIS — I2510 Atherosclerotic heart disease of native coronary artery without angina pectoris: Secondary | ICD-10-CM

## 2016-12-19 LAB — POCT GLYCOSYLATED HEMOGLOBIN (HGB A1C): Hemoglobin A1C: 7.8

## 2016-12-19 NOTE — Progress Notes (Signed)
Subjective: Chief Complaint  Patient presents with  . Diabetes    dm check up    Here for routine diabetes check.   Diabetes - taking Kombiglyze XR 5/1000mg  daily.   Checking sugars every now and then.    Seeing fasting readings a little high recently so he has cut back on sweet tea.   Been in the 140-160 range.   Checking feet.   No foot lesions.      High cholesterol - Lipitor 80mg  daily, 2 Lovaza BID.  Compliant with BP medications, and other medications as directed.    Exercise - "gets up in the morning."  Playing with his 44yo son regularly.   Declines vaccines  Past Medical History:  Diagnosis Date  . Atrial flutter ([place])    a. atypical atrial flutter.  . Bell's palsy   . CAD (coronary artery disease)    a. 07/2009 Cath: LM nl, LAD nl, LCX nl w/ L->R collats, chronically occluded RCA s/p failed PCI;  b. 07/2012 MV: basal inf mild ischemia.  . Central retinal artery occlusion, left eye    diagnosed in 2008  . Cerebrovascular accident [facility])    a. 03/2008 secondary to cardioembolic event;  b. chronic coumadin.  . Chronic diastolic CHF (congestive heart failure) ([place])    a. 12/2008 Echo: nl EF.  . Diabetes mellitus 11/14/2010   TYPE II  . Hematuria 05/2013   Urology, Dr. Estill Dooms  . Hyperlipidemia   . Hypertension   . Hypogonadism, male 05/2013   Dr. Estill Dooms, Urology  . Morbid obesity ([place])   . Obesity   . Obstructive sleep apnea   . Persistent atrial fibrillation ([place])    a. s/p afib ablation by [facility] 08/01/08  . Recommendation refused by patient    multiple recommended vaccines refused over the years (flu/pneumovax)  . Skin infection    chronic left leg herpetic   Current Outpatient Prescriptions on File Prior to Visit  Medication Sig Dispense Refill  . ACCU-CHEK FASTCLIX LANCETS MISC Test blood sugar bid. Dx code E11.9 102 each 1  . amLODipine (NORVASC) 10 MG tablet Take 1 tablet (10 mg total) by mouth daily. 90 tablet 1  . atorvastatin (LIPITOR) 80 MG tablet Take 1  tablet (80 mg total) by mouth daily. 90 tablet 3  . b complex vitamins capsule Take 1 capsule by mouth every other day.     . Cholecalciferol (VITAMIN D3) 10000 UNITS capsule Take 1 capsule (10,000 Units total) by mouth daily. 4 capsule 0  . glucose blood (ACCU-CHEK AVIVA PLUS) test strip 1 each by Other route 2 (two) times daily. Test blood glucose twice daily. Dx Code E11.9 50 each 10  . isosorbide mononitrate (IMDUR) 30 MG 24 hr tablet take 1 tablet by mouth once daily 30 tablet 9  . KOMBIGLYZE XR 11-998 MG TB24 take 1 tablet once daily 30 tablet 0  . losartan (COZAAR) 100 MG tablet take 1 tablet by mouth once daily 30 tablet 6  . metoprolol (LOPRESSOR) 50 MG tablet take 1 tablet by mouth twice a day 60 tablet 9  . omega-3 acid ethyl esters (LOVAZA) 1 g capsule Take 2 capsules (2 g total) by mouth 2 (two) times daily. 360 capsule 3  . Testosterone (ANDROGEL PUMP) 1.25 GM/ACT (1%) GEL Place 2 application onto the skin daily.     . valACYclovir (VALTREX) 500 MG tablet take 1 tablet by mouth once daily 90 tablet 1  . warfarin (COUMADIN) 5 MG tablet take as  directed 45 tablet 3   No current facility-administered medications on file prior to visit.    ROS as in subjective   Objective: BP 138/82   Pulse 74   Wt 284 lb 3.2 oz (128.9 kg)   BMI 40.78 kg/m   Wt Readings from Last 3 Encounters:  12/19/16 284 lb 3.2 oz (128.9 kg)  09/06/16 282 lb 6.4 oz (128.1 kg)  06/06/16 283 lb 9.6 oz (128.6 kg)   BP Readings from Last 3 Encounters:  12/19/16 138/82  09/06/16 128/80  06/06/16 (!) 146/88   General appearance: alert, no distress, WD/WN,  Oral cavity: MMM, no lesions Neck: supple, no lymphadenopathy, no thyromegaly, no masses Heart: RRR, normal S1, S2, no murmurs Lungs: CTA bilaterally, no wheezes, rhonchi, or rales Abdomen: +bs, soft, non tender, non distended, no masses, no hepatomegaly, no splenomegaly Pulses: 1+ symmetric, upper and lower extremities, normal cap refill Ext: no  edema    Diabetic Foot Exam - Simple   Simple Foot Form Diabetic Foot exam was performed with the following findings:  Yes 12/19/2016 11:47 AM  Visual Inspection No deformities, no ulcerations, no other skin breakdown bilaterally:  Yes Sensation Testing See comments:  Yes Pulse Check Posterior Tibialis and Dorsalis pulse intact bilaterally:  Yes Comments Few reduced sensation points right lateral toes and volar foot      Assessment: Encounter Diagnoses  Name Primary?  . Diabetes mellitus with complication ([place]) Yes  . Coronary artery disease involving native coronary artery of native heart without angina pectoris   . Essential hypertension, benign   . Mixed dyslipidemia   . Morbid obesity ([place])   . Vaccine refused by patient       Plan: Diabetes - HgbA1C 7.8 % today.  Last visit we changed from metformin to [place].  We discussed possibly adding GLP1 medication such as Trulicity.  He will consider.   C/t foot checks, daily glucose monitoring, needs to improve on diet and exercise.     CAD - sees cardiology  HTN - c/t same medication  hyperlipidemia - reviewed last labs, c/t statin and Lovaza   He continues to refuse vaccines  Obesity - dicussed diet, exercise, and tried to encourage him to make further improvement such as less eating out, less tea, less overall calories, cut back on meat in general.   Set personal weight loss goals   [name] was seen today for diabetes.  Diagnoses and all orders for this visit:  Diabetes mellitus with complication ([place]) -     HM DIABETES FOOT EXAM -     POCT glycosylated hemoglobin (Hb A1C)  Coronary artery disease involving native coronary artery of native heart without angina pectoris  Essential hypertension, benign  Mixed dyslipidemia  Morbid obesity ([place])  Vaccine refused by patient

## 2016-12-20 ENCOUNTER — Ambulatory Visit (INDEPENDENT_AMBULATORY_CARE_PROVIDER_SITE_OTHER): Payer: Medicare Other | Admitting: Pharmacist

## 2016-12-20 ENCOUNTER — Ambulatory Visit (INDEPENDENT_AMBULATORY_CARE_PROVIDER_SITE_OTHER): Payer: Medicare Other | Admitting: Cardiology

## 2016-12-20 ENCOUNTER — Ambulatory Visit (INDEPENDENT_AMBULATORY_CARE_PROVIDER_SITE_OTHER): Payer: Medicare Other | Admitting: *Deleted

## 2016-12-20 ENCOUNTER — Encounter: Payer: Self-pay | Admitting: Cardiology

## 2016-12-20 VITALS — BP 140/80 | HR 77 | Ht 70.0 in | Wt 284.1 lb

## 2016-12-20 VITALS — BP 140/80 | HR 77 | Ht 70.0 in | Wt 284.0 lb

## 2016-12-20 DIAGNOSIS — I4819 Other persistent atrial fibrillation: Secondary | ICD-10-CM

## 2016-12-20 DIAGNOSIS — I2510 Atherosclerotic heart disease of native coronary artery without angina pectoris: Secondary | ICD-10-CM

## 2016-12-20 DIAGNOSIS — I5032 Chronic diastolic (congestive) heart failure: Secondary | ICD-10-CM

## 2016-12-20 DIAGNOSIS — I481 Persistent atrial fibrillation: Secondary | ICD-10-CM

## 2016-12-20 DIAGNOSIS — E782 Mixed hyperlipidemia: Secondary | ICD-10-CM | POA: Diagnosis not present

## 2016-12-20 DIAGNOSIS — I4891 Unspecified atrial fibrillation: Secondary | ICD-10-CM

## 2016-12-20 DIAGNOSIS — I6350 Cerebral infarction due to unspecified occlusion or stenosis of unspecified cerebral artery: Secondary | ICD-10-CM

## 2016-12-20 DIAGNOSIS — Z5181 Encounter for therapeutic drug level monitoring: Secondary | ICD-10-CM | POA: Diagnosis not present

## 2016-12-20 DIAGNOSIS — I10 Essential (primary) hypertension: Secondary | ICD-10-CM | POA: Diagnosis not present

## 2016-12-20 DIAGNOSIS — G4733 Obstructive sleep apnea (adult) (pediatric): Secondary | ICD-10-CM

## 2016-12-20 LAB — POCT INR: INR: 3.8

## 2016-12-20 NOTE — Patient Instructions (Addendum)
Medication Instructions:  Your physician recommends that you continue on your current medications as directed. Please refer to the Current Medication list given to you today.   Labwork: None  Testing/Procedures: None  Follow-Up: You have been referred to LIPID CLINIC.  Your physician wants you to follow-up in: 6 months with Dr. Radford Pax. You will receive a reminder letter in the mail two months in advance. If you don't receive a letter, please call our office to schedule the follow-up appointment.   Any Other Special Instructions Will Be Listed Below (If Applicable).     If you need a refill on your cardiac medications before your next appointment, please call your pharmacy.

## 2016-12-20 NOTE — Progress Notes (Signed)
Patient ID: [name]                 DOB: 1951-05-28                    MRN: [id number]     HPI: [name] is a 66 y.o. male patient referred to lipid clinic by Dr Radford Pax. PMH is significant for ASCVD, HTN, OSA, DM, diastolic CHF, morbid obesity, PAF, CVA in 2009, and HLD. Cath in 2011 showed chronically occluded RCA with left to right collaterals with failed PCI. Pt presents to clinic for further management.  Pt reports tolerating Lipitor 80mg  daily and fish oil 2g BID well. He has previously taken Crestor and Zetia but felt dizzy and that the medications "made him crazy." Pt stays active with his 28 year old child and he tries to avoid fried food and red meats.  Current Medications: Lipitor 80mg  daily, fish oil 2g BID Intolerances: Crestor 20mg  daily, Zetia 10mg  daily - "made him feel crazy," also felt dizzy Risk Factors: ASCVD - CAD, DM, CVA, family history LDL goal: 70mg /dL  Diet: Eats a lot of vegetables, likes meatloaf and grills meat. Avoids fried food.   Exercise: Has an 66 year old - stays active  Family History: Father with history of stroke and heart disease. Mother with history of heart disease.  Social History: Former smoker, quit in 1981. Former alcohol and drug use, none currently.  Labs: 09/06/16: TC 174, TG 219, HDL 42, LDL 88 (Lipitor 80mg  daily)  Past Medical History:  Diagnosis Date  . Atrial flutter ([place])    a. atypical atrial flutter.  . Bell's palsy   . CAD (coronary artery disease)    a. 07/2009 Cath: LM nl, LAD nl, LCX nl w/ L->R collats, chronically occluded RCA s/p failed PCI;  b. 07/2012 MV: basal inf mild ischemia.  . Central retinal artery occlusion, left eye    diagnosed in 2008  . Cerebrovascular accident [name])    a. 03/2008 secondary to cardioembolic event;  b. chronic coumadin.  . Chronic diastolic CHF (congestive heart failure) ([place])    a. 12/2008 Echo: nl EF.  . Diabetes mellitus 11/14/2010   TYPE II  . Hematuria 05/2013   Urology, Dr.  Estill Dooms  . Hyperlipidemia   . Hypertension   . Hypogonadism, male 05/2013   Dr. Estill Dooms, Urology  . Morbid obesity ([place])   . Obesity   . Obstructive sleep apnea   . Persistent atrial fibrillation ([place])    a. s/p afib ablation by [facility] 08/01/08  . Recommendation refused by patient    multiple recommended vaccines refused over the years (flu/pneumovax)  . Skin infection    chronic left leg herpetic    Current Outpatient Prescriptions on File Prior to Visit  Medication Sig Dispense Refill  . ACCU-CHEK FASTCLIX LANCETS MISC Test blood sugar bid. Dx code E11.9 102 each 1  . amLODipine (NORVASC) 10 MG tablet Take 1 tablet (10 mg total) by mouth daily. 90 tablet 1  . atorvastatin (LIPITOR) 80 MG tablet Take 1 tablet (80 mg total) by mouth daily. 90 tablet 3  . b complex vitamins capsule Take 1 capsule by mouth every other day.     . Cholecalciferol (VITAMIN D3) 10000 UNITS capsule Take 1 capsule (10,000 Units total) by mouth daily. 4 capsule 0  . glucose blood (ACCU-CHEK AVIVA PLUS) test strip 1 each by Other route 2 (two) times daily. Test blood glucose twice daily. Dx Code E11.9 50  each 10  . isosorbide mononitrate (IMDUR) 30 MG 24 hr tablet take 1 tablet by mouth once daily 30 tablet 9  . KOMBIGLYZE XR 11-998 MG TB24 take 1 tablet once daily 30 tablet 0  . losartan (COZAAR) 100 MG tablet take 1 tablet by mouth once daily 30 tablet 6  . metoprolol (LOPRESSOR) 50 MG tablet take 1 tablet by mouth twice a day 60 tablet 9  . omega-3 acid ethyl esters (LOVAZA) 1 g capsule Take 2 capsules (2 g total) by mouth 2 (two) times daily. 360 capsule 3  . Testosterone (ANDROGEL PUMP) 1.25 GM/ACT (1%) GEL Place 2 application onto the skin daily.     . valACYclovir (VALTREX) 500 MG tablet take 1 tablet by mouth once daily 90 tablet 1  . warfarin (COUMADIN) 5 MG tablet take as directed 45 tablet 3   No current facility-administered medications on file prior to visit.     Allergies  Allergen Reactions  .  Crestor [Rosuvastatin] Other (See Comments)    Made him feel crazy  . Zetia [Ezetimibe] Other (See Comments)    Made him feel crazy  . Lisinopril Cough  . Wilder Glade [Dapagliflozin]     Made heart race    Assessment/Plan:  1. Hyperlipidemia - LDL currently 88mg /dL above goal 70mg /dL given history of ASCVD. Pt is tolerating Lipitor 80mg  daily and has had previous intolerances to Crestor and Zetia. PCSK9i is the only option to bring LDL to goal. Discussed expected benefits, side effects, and injection technique of PCSK9i. Will submit prior authorization for Praluent therapy and follow up with patient via telephone.  Pt signed informed consent for [facility] lipid registry.   Megan E. Supple, PharmD, CPP, [place] [address], [place], [place] [address] Phone: [phone]; Fax: [phone] 12/20/2016 9:40 AM

## 2016-12-20 NOTE — Progress Notes (Signed)
Cardiology Office Note    Date:  12/20/2016   ID:  [name], DOB [birth year], MRN [id number]  PCP:  Denita Lung, MD  Cardiologist:  Fransico Him, MD   Chief Complaint  Patient presents with  . Coronary Artery Disease  . Hypertension  . Hyperlipidemia  . Atrial Fibrillation  . Sleep Apnea    History of Present Illness:  [name] is a 66 y.o. male with a history of ASCAD with chronically occluded RCA s/p failed PCI with L>>R collaterals  PCI), HTN, OSA, morbid obesity, persistent AF ad dyslipidemia.  He presents today for followup and is doing well.He is s/p afib ablation by Dr. Rayann Heman but unable to ablate atypical atrial flutter.He denies any chest pain or pressure, SOB, DOE, PND, orthopnea, LE edema, palpitations, dizziness or syncope.He is doing well with his CPAP device. He tolerates the  nasal mask without problems. He feels the pressure is adequate and sleeps well at night. He feels rested when he gets up and has minimal daytime sleepiness. Rarely he will take an afternoon nap.  He denies any significant mouth or nasal dryness.   Past Medical History:  Diagnosis Date  . Atrial flutter ([place])    a. atypical atrial flutter.  . Bell's palsy   . CAD (coronary artery disease)    a. 07/2009 Cath: LM nl, LAD nl, LCX nl w/ L->R collats, chronically occluded RCA s/p failed PCI;  b. 07/2012 MV: basal inf mild ischemia.  . Central retinal artery occlusion, left eye    diagnosed in 2008  . Cerebrovascular accident [facility])    a. 03/2008 secondary to cardioembolic event;  b. chronic coumadin.  . Chronic diastolic CHF (congestive heart failure) ([place])    a. 12/2008 Echo: nl EF.  . Diabetes mellitus 11/14/2010   TYPE II  . Hematuria 05/2013   Urology, Dr. Estill Dooms  . Hyperlipidemia   . Hypertension   . Hypogonadism, male 05/2013   Dr. Estill Dooms, Urology  . Morbid obesity ([place])   . Obesity   . Obstructive sleep apnea   . Persistent atrial fibrillation ([place])    a. s/p afib  ablation by [facility] 08/01/08  . Recommendation refused by patient    multiple recommended vaccines refused over the years (flu/pneumovax)  . Skin infection    chronic left leg herpetic    Past Surgical History:  Procedure Laterality Date  . ATRIAL ABLATION SURGERY  08/01/08   CTI and PVI ablation by JA  . COLONOSCOPY     2012 per patient  . ELECTROPHYSIOLOGIC STUDY N/A 11/30/2014   Procedure: Atrial Fibrillation Ablation;  Surgeon: [name], MD;  Location: [place] CV LAB;  Service: Cardiovascular;  Laterality: N/A;  . TEE WITHOUT CARDIOVERSION N/A 11/30/2014   Procedure: TRANSESOPHAGEAL ECHOCARDIOGRAM (TEE);  Surgeon: Josue Hector, MD;  Location: [facility] ENDOSCOPY;  Service: Cardiovascular;  Laterality: N/A;    Current Medications: Current Meds  Medication Sig  . ACCU-CHEK FASTCLIX LANCETS MISC Test blood sugar bid. Dx code E11.9  . amLODipine (NORVASC) 10 MG tablet Take 1 tablet (10 mg total) by mouth daily.  [name] atorvastatin (LIPITOR) 80 MG tablet Take 1 tablet (80 mg total) by mouth daily.  [name] b complex vitamins capsule Take 1 capsule by mouth every other day.   . Cholecalciferol (VITAMIN D3) 10000 UNITS capsule Take 1 capsule (10,000 Units total) by mouth daily.  [name] glucose blood (ACCU-CHEK AVIVA PLUS) test strip 1 each by Other route 2 (two) times daily. Test blood  glucose twice daily. Dx Code E11.9  . isosorbide mononitrate (IMDUR) 30 MG 24 hr tablet take 1 tablet by mouth once daily  . KOMBIGLYZE XR 11-998 MG TB24 take 1 tablet once daily  . losartan (COZAAR) 100 MG tablet take 1 tablet by mouth once daily  . metoprolol (LOPRESSOR) 50 MG tablet take 1 tablet by mouth twice a day  . omega-3 acid ethyl esters (LOVAZA) 1 g capsule Take 2 capsules (2 g total) by mouth 2 (two) times daily.  . Testosterone (ANDROGEL PUMP) 1.25 GM/ACT (1%) GEL Place 2 application onto the skin daily.   . valACYclovir (VALTREX) 500 MG tablet take 1 tablet by mouth once daily  . warfarin (COUMADIN) 5 MG  tablet take as directed    Allergies:   Crestor [rosuvastatin]; Zetia [ezetimibe]; Lisinopril; and [place] [dapagliflozin]   Social History   Social History  . Marital status: Married    Spouse name: N/A  . Number of children: N/A  . Years of education: N/A   Social History Main Topics  . Smoking status: Former Smoker    Quit date: 07/24/1979  . Smokeless tobacco: Never Used  . Alcohol use No     Comment: former  . Drug use: No     Comment: former  . Sexual activity: Not Asked   Other Topics Concern  . None   Social History Narrative   Lives in [place], [place]. With his spouse and son. Has remote history of tobacco, but quit 30 years ago. Has heavy history of alcohol consumption but quit 30 years ago. Previously used marijuana and cocaine, but denies use over the past 30 years. 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      Family History:  The patient's family history includes Heart disease in his father and mother; Stroke in his father.   ROS:   Please see the history of present illness.    ROS All other systems reviewed and are negative.  No flowsheet data found.     PHYSICAL EXAM:   VS:  BP 140/80   Pulse 77   Ht 5\' 10"  (1.778 m)   Wt 284 lb 1.9 oz (128.9 kg)   SpO2 94%   BMI 40.77 kg/m    GEN: Well nourished, well developed, in no acute distress  HEENT: normal  Neck: no JVD, carotid bruits, or masses Cardiac: RRR; no murmurs, rubs, or gallops,no edema.  Intact distal pulses bilaterally.  Respiratory:  clear to auscultation bilaterally, normal work of breathing GI: soft, nontender, nondistended, + BS MS: no deformity or atrophy  Skin: warm and dry, no rash Neuro:  Alert and Oriented x 3, Strength and sensation are intact Psych: euthymic mood, full affect  Wt Readings from Last 3 Encounters:  12/20/16 284 lb 1.9 oz (128.9 kg)  12/19/16 284 lb 3.2 oz (128.9 kg)  09/06/16 282 lb 6.4 oz (128.1 kg)      Studies/Labs Reviewed:   EKG:  EKG is not  ordered today.   Recent Labs: 02/27/2016: Hemoglobin 15.2; Platelets 185 09/06/2016: ALT 29; BUN 12; Creat 0.90; Potassium 4.7; Sodium 137   Lipid Panel    Component Value Date/Time   CHOL 174 09/06/2016 1020   TRIG 219 (H) 09/06/2016 1020   HDL 42 09/06/2016 1020   CHOLHDL 4.1 09/06/2016 1020   VLDL 44 (H) 09/06/2016 1020   LDLCALC 88 09/06/2016 1020   LDLDIRECT 87.7 05/08/2013 0837    Additional studies/ records that were reviewed today include:  CPAP download  ASSESSMENT:    1. Chronic diastolic heart failure ([place])   2. Coronary artery disease involving native coronary artery of native heart without angina pectoris   3. Persistent atrial fibrillation ([place])   4. Essential hypertension, benign   5. OSA (obstructive sleep apnea)   6. Mixed dyslipidemia      PLAN:  In order of problems listed above:  1. Chronic diastolic CHF - He appears euvolemic on exam and weight is stable.  He is currently not on diuretics. 2. ASCAD with chronically occluded RCA with left to right collaterals.  He has not had any anginal symptoms. He will continue Imdur 30mg  daily, metoprolol 50mg  BID and statin.  He is not on ASA due to warfarin.  3. Persistent atrial fibrillation s/p ablation - He is maintaining NSR. He will continue BB and warfarin.   4. HTN - BP well controlled on exam today.  Continue amlodipine 10mg  daily, metoprolol 50mg  BID and Losartan 100mg  daily.  5. OSA - the patient is tolerating PAP therapy well without any problems. The PAP download was reviewed today and showed an AHI of 0.5/hr on 16 cm H2O with 100% compliance in using more than 4 hours nightly.  The patient has been using and benefiting from CPAP use and will continue to benefit from therapy.  6. Dyslipidemia LDL goal < 70.   He will continue statin. His last LDL was above goal at 88.  He is intolerant to Zetia.  I will have him seen in lipid clinic for PCSK9 drug.    Medication Adjustments/Labs and Tests  Ordered: Current medicines are reviewed at length with the patient today.  Concerns regarding medicines are outlined above.  Medication changes, Labs and Tests ordered today are listed in the Patient Instructions below.  There are no Patient Instructions on file for this visit.   Signed, Fransico Him, MD  12/20/2016 8:29 AM    [facility] [address], [place], [place]  [address] Phone: [phone]; Fax: [phone]

## 2016-12-20 NOTE — Patient Instructions (Signed)
It was nice to see you today  We will start paperwork for Praluent injections for your cholesterol  Continue taking your Lipitor and fish oil for your cholesterol

## 2016-12-21 ENCOUNTER — Telehealth: Payer: Self-pay | Admitting: Pharmacist

## 2016-12-21 MED ORDER — ALIROCUMAB 75 MG/ML ~~LOC~~ SOPN: 1.0000 "pen " | PEN_INJECTOR | SUBCUTANEOUS | 11 refills | Status: DC

## 2016-12-21 NOTE — Telephone Encounter (Signed)
Pt approved for Praluent therapy. Rx sent to [facility]. Pt is aware. With Medicare and Medicaid, copay should be available.

## 2016-12-24 ENCOUNTER — Other Ambulatory Visit: Payer: Self-pay | Admitting: Medical

## 2016-12-25 ENCOUNTER — Telehealth: Payer: Self-pay | Admitting: Cardiology

## 2016-12-25 DIAGNOSIS — E782 Mixed hyperlipidemia: Secondary | ICD-10-CM

## 2016-12-25 NOTE — Telephone Encounter (Signed)
New message    Urban Gibson from [facility] is calling for a list of pt medications.

## 2016-12-26 NOTE — Telephone Encounter (Signed)
Med list faxed to specialty pharmacy.

## 2016-12-31 ENCOUNTER — Encounter: Payer: Self-pay | Admitting: *Deleted

## 2016-12-31 DIAGNOSIS — Z006 Encounter for examination for normal comparison and control in clinical research program: Secondary | ICD-10-CM

## 2016-12-31 NOTE — Progress Notes (Signed)
Late entry: Subject met inclusion and exclusion criteria. The informed consent form, study requirements and expectations were reviewed with the subject and questions and concerns were addressed prior to the signing of the consent form. The subject verbalized understanding of the trail requirements. The subject agreed to participate in the [place] Registry[place] signed the informed consent. The informed consent was obtained prior to performance of any protocol-specific procedures for the subject. A copy of the signed informed consent was given to the subject and a copy was placed in the subject's medical record.  Jake Bathe, RN 12/20/2016

## 2017-01-07 ENCOUNTER — Ambulatory Visit (INDEPENDENT_AMBULATORY_CARE_PROVIDER_SITE_OTHER): Payer: Medicare Other | Admitting: Internal Medicine

## 2017-01-07 ENCOUNTER — Encounter: Payer: Self-pay | Admitting: Internal Medicine

## 2017-01-07 VITALS — BP 138/70 | HR 90 | Ht 70.5 in | Wt 287.2 lb

## 2017-01-07 DIAGNOSIS — I6350 Cerebral infarction due to unspecified occlusion or stenosis of unspecified cerebral artery: Secondary | ICD-10-CM

## 2017-01-07 DIAGNOSIS — I481 Persistent atrial fibrillation: Secondary | ICD-10-CM | POA: Diagnosis not present

## 2017-01-07 DIAGNOSIS — I4819 Other persistent atrial fibrillation: Secondary | ICD-10-CM

## 2017-01-07 NOTE — Patient Instructions (Addendum)

## 2017-01-07 NOTE — Progress Notes (Signed)
PCP: Travis Lung, MD Primary Cardiologist: Dr Elberta Fortis is a 66 y.o. male who presents today for routine electrophysiology followup.  Since last being seen in our clinic, the patient reports doing very well.  Denies symptomatic arrhythmias post repeat ablation.  Today, he denies symptoms of palpitations, chest pain, shortness of breath,  lower extremity edema, dizziness, presyncope, or syncope.  The patient is otherwise without complaint today.   Past Medical History:  Diagnosis Date  . Atrial flutter ([place])    a. atypical atrial flutter.  . Bell's palsy   . CAD (coronary artery disease)    a. 07/2009 Cath: LM nl, LAD nl, LCX nl w/ L->R collats, chronically occluded RCA s/p failed PCI;  b. 07/2012 MV: basal inf mild ischemia.  . Central retinal artery occlusion, left eye    diagnosed in 2008  . Cerebrovascular accident [facility])    a. 03/2008 secondary to cardioembolic event;  b. chronic coumadin.  . Chronic diastolic CHF (congestive heart failure) ([place])    a. 12/2008 Echo: nl EF.  . Diabetes mellitus 11/14/2010   TYPE II  . Hematuria 05/2013   Urology, Dr. Estill Dooms  . Hyperlipidemia   . Hypertension   . Hypogonadism, male 05/2013   Dr. Estill Dooms, Urology  . Morbid obesity ([place])   . Obesity   . Obstructive sleep apnea   . Persistent atrial fibrillation ([place])    a. s/p afib ablation by [facility] 08/01/08  . Recommendation refused by patient    multiple recommended vaccines refused over the years (flu/pneumovax)  . Skin infection    chronic left leg herpetic   Past Surgical History:  Procedure Laterality Date  . ATRIAL ABLATION SURGERY  08/01/08   CTI and PVI ablation by JA  . COLONOSCOPY     2012 per patient  . ELECTROPHYSIOLOGIC STUDY N/A 11/30/2014   Procedure: Atrial Fibrillation Ablation;  Surgeon: [name], MD;  Location: [place] CV LAB;  Service: Cardiovascular;  Laterality: N/A;  . TEE WITHOUT CARDIOVERSION N/A 11/30/2014   Procedure: TRANSESOPHAGEAL ECHOCARDIOGRAM  (TEE);  Surgeon: Josue Hector, MD;  Location: [facility] ENDOSCOPY;  Service: Cardiovascular;  Laterality: N/A;    ROS- all systems are reviewed and negatives except as per HPI above  Current Outpatient Prescriptions  Medication Sig Dispense Refill  . ACCU-CHEK FASTCLIX LANCETS MISC Test blood sugar bid. Dx code E11.9 102 each 1  . Alirocumab (PRALUENT) 75 MG/ML SOPN Inject 1 pen into the skin every 14 (fourteen) days. 2 pen 11  . amLODipine (NORVASC) 10 MG tablet take 1 tablet by mouth once daily 90 tablet 1  . atorvastatin (LIPITOR) 80 MG tablet Take 1 tablet (80 mg total) by mouth daily. 90 tablet 3  . b complex vitamins capsule Take 1 capsule by mouth every other day.     . Cholecalciferol (VITAMIN D3) 10000 UNITS capsule Take 1 capsule (10,000 Units total) by mouth daily. 4 capsule 0  . glucose blood (ACCU-CHEK AVIVA PLUS) test strip 1 each by Other route 2 (two) times daily. Test blood glucose twice daily. Dx Code E11.9 50 each 10  . isosorbide mononitrate (IMDUR) 30 MG 24 hr tablet take 1 tablet by mouth once daily 30 tablet 9  . KOMBIGLYZE XR 11-998 MG TB24 take 1 tablet once daily 30 tablet 0  . losartan (COZAAR) 100 MG tablet take 1 tablet by mouth once daily 30 tablet 6  . metoprolol (LOPRESSOR) 50 MG tablet take 1 tablet by mouth twice a  day 60 tablet 9  . omega-3 acid ethyl esters (LOVAZA) 1 g capsule Take 2 capsules (2 g total) by mouth 2 (two) times daily. 360 capsule 3  . Testosterone (ANDROGEL PUMP) 1.25 GM/ACT (1%) GEL Place 2 application onto the skin daily.     . valACYclovir (VALTREX) 500 MG tablet take 1 tablet by mouth once daily 90 tablet 1  . warfarin (COUMADIN) 5 MG tablet take as directed 45 tablet 3   No current facility-administered medications for this visit.     Physical Exam: Vitals:   01/07/17 1532  BP: 138/70  Pulse: 90  SpO2: 95%  Weight: 287 lb 3.2 oz (130.3 kg)  Height: 5' 10.5" (1.791 m)    GEN- The patient is well appearing, alert and oriented x 3  today.   Head- normocephalic, atraumatic Eyes-  Sclera clear, conjunctiva pink Ears- hearing intact Oropharynx- clear Lungs- Clear to ausculation bilaterally, normal work of breathing Heart- Regular rate and rhythm, no murmurs, rubs or gallops, PMI not laterally displaced GI- soft, NT, ND, + BS Extremities- no clubbing, cyanosis, or edema  EKG tracing ordered today is personally reviewed and shows sinus rhythm 90 bpm, PVCs, PR 172 msec, Qtc 445 msec  Assessment and Plan:  1. Paroxysmal afib/ atypicial atrial flutter Doing well s/p ablation off AAD therapy without recurrence On coumadin (not interested in NOAC therapy currently)  2. Obesity Body mass index is 40.63 kg/m. No real progress with weight loss  3. HTN Stable No change required today   Follow-up with Dr Radford Pax as scheduled Return to see me in 42 months   [name] MD, [facility] 01/07/2017 3:51 PM

## 2017-01-10 NOTE — Telephone Encounter (Signed)
Pt started Praluent on 12/30/16. He reports tolerating well. Orders entered for follow up labs. Pt states understanding and appreciation.

## 2017-01-10 NOTE — Addendum Note (Signed)
Addended by: Erskine Emery on: 01/10/2017 03:20 PM   Modules accepted: Orders

## 2017-01-17 ENCOUNTER — Ambulatory Visit (INDEPENDENT_AMBULATORY_CARE_PROVIDER_SITE_OTHER): Payer: Medicare Other

## 2017-01-17 DIAGNOSIS — I6350 Cerebral infarction due to unspecified occlusion or stenosis of unspecified cerebral artery: Secondary | ICD-10-CM

## 2017-01-17 DIAGNOSIS — I481 Persistent atrial fibrillation: Secondary | ICD-10-CM | POA: Diagnosis not present

## 2017-01-17 DIAGNOSIS — Z5181 Encounter for therapeutic drug level monitoring: Secondary | ICD-10-CM

## 2017-01-17 DIAGNOSIS — I4891 Unspecified atrial fibrillation: Secondary | ICD-10-CM

## 2017-01-17 DIAGNOSIS — I4819 Other persistent atrial fibrillation: Secondary | ICD-10-CM

## 2017-01-17 LAB — POCT INR: INR: 3

## 2017-01-18 ENCOUNTER — Other Ambulatory Visit: Payer: Self-pay | Admitting: Cardiology

## 2017-01-18 ENCOUNTER — Other Ambulatory Visit: Payer: Self-pay | Admitting: Medical

## 2017-01-21 ENCOUNTER — Other Ambulatory Visit: Payer: Self-pay | Admitting: Internal Medicine

## 2017-01-21 ENCOUNTER — Other Ambulatory Visit: Payer: Self-pay | Admitting: Medical

## 2017-01-22 ENCOUNTER — Telehealth: Payer: Self-pay | Admitting: Cardiology

## 2017-01-22 NOTE — Telephone Encounter (Signed)
Walk In pt Form-Neighbor [facility] clearance dropped off. Placed in [place].

## 2017-02-04 ENCOUNTER — Telehealth: Payer: Self-pay

## 2017-02-04 NOTE — Telephone Encounter (Signed)
Received walk-in form that patient is establishing with new dentist and wants Dr. Theodosia Blender opinion on the dentist prior to establishing.  Per Dr. Radford Pax, she does not know the dentist office on paperwork.  Left message for patient to call back to discuss whether he wants the paperwork filled out and faxed.

## 2017-02-07 NOTE — Telephone Encounter (Signed)
Informed patient Dr. Radford Pax does not know dentist. He still requests Dr. Radford Pax fill out paperwork and fax. Paperwork placed in Dr. Theodosia Blender "to be signed" folder.

## 2017-02-07 NOTE — Telephone Encounter (Signed)
Follow up  ° ° ° °Pt is calling to follow up on this. Please call.  °

## 2017-02-19 ENCOUNTER — Ambulatory Visit (INDEPENDENT_AMBULATORY_CARE_PROVIDER_SITE_OTHER): Payer: Medicare Other | Admitting: *Deleted

## 2017-02-19 ENCOUNTER — Encounter (INDEPENDENT_AMBULATORY_CARE_PROVIDER_SITE_OTHER): Payer: Self-pay

## 2017-02-19 ENCOUNTER — Other Ambulatory Visit: Payer: Medicare Other

## 2017-02-19 DIAGNOSIS — I6350 Cerebral infarction due to unspecified occlusion or stenosis of unspecified cerebral artery: Secondary | ICD-10-CM

## 2017-02-19 DIAGNOSIS — I4819 Other persistent atrial fibrillation: Secondary | ICD-10-CM

## 2017-02-19 DIAGNOSIS — Z5181 Encounter for therapeutic drug level monitoring: Secondary | ICD-10-CM | POA: Diagnosis not present

## 2017-02-19 DIAGNOSIS — I481 Persistent atrial fibrillation: Secondary | ICD-10-CM | POA: Diagnosis not present

## 2017-02-19 DIAGNOSIS — I4891 Unspecified atrial fibrillation: Secondary | ICD-10-CM

## 2017-02-19 DIAGNOSIS — E782 Mixed hyperlipidemia: Secondary | ICD-10-CM | POA: Diagnosis not present

## 2017-02-19 LAB — LIPID PANEL
Chol/HDL Ratio: 2.5 ratio (ref 0.0–5.0)
Cholesterol, Total: 106 mg/dL (ref 100–199)
HDL: 42 mg/dL (ref >39)
LDL Calculated: 35 mg/dL (ref 0–99)
Triglycerides: 146 mg/dL (ref 0–149)
VLDL Cholesterol Cal: 29 mg/dL (ref 5–40)

## 2017-02-19 LAB — HEPATIC FUNCTION PANEL
ALT: 26 IU/L (ref 0–44)
AST: 22 IU/L (ref 0–40)
Albumin: 4.5 g/dL (ref 3.6–4.8)
Alkaline Phosphatase: 76 IU/L (ref 39–117)
Bilirubin Total: 0.3 mg/dL (ref 0.0–1.2)
Bilirubin, Direct: 0.1 mg/dL (ref 0.00–0.40)
Total Protein: 7.1 g/dL (ref 6.0–8.5)

## 2017-02-19 LAB — POCT INR: INR: 3

## 2017-02-20 ENCOUNTER — Other Ambulatory Visit: Payer: Self-pay | Admitting: Cardiology

## 2017-02-20 ENCOUNTER — Telehealth: Payer: Self-pay | Admitting: Internal Medicine

## 2017-02-20 ENCOUNTER — Other Ambulatory Visit: Payer: Self-pay | Admitting: Medical

## 2017-02-20 ENCOUNTER — Telehealth: Payer: Self-pay | Admitting: Cardiology

## 2017-02-20 NOTE — Telephone Encounter (Signed)
done

## 2017-02-20 NOTE — Telephone Encounter (Signed)
Lars Mage (envisions) calling in regards to a pre-authorization for patient's Praluent medication. She states that medication is set to ship tomorrow but will not ship if pre-authorization isn't complete. Alisha left a number [phone] to call and give a verbal pre-authorization.

## 2017-02-21 NOTE — Telephone Encounter (Signed)
Pharmacy calling back to state that they have not been notified by insurance that approved. Advised that if we are notified we will let them know.

## 2017-02-21 NOTE — Telephone Encounter (Signed)
New message    [facility] pharmacy is calling about prior authorization for pt Praluent

## 2017-02-21 NOTE — Telephone Encounter (Signed)
Spoke to pharmacy. Waiting on MD signature. The PA form was faxed back this morning for coverage of Praluent.

## 2017-03-04 ENCOUNTER — Telehealth: Payer: Self-pay | Admitting: Medical

## 2017-03-04 NOTE — Telephone Encounter (Signed)
Lets go ahead and do diabetes f/u and discuss options.  Last visit he wasn't at goal , so we may need to make a change anyhow

## 2017-03-04 NOTE — Telephone Encounter (Signed)
Pt would like to stop taking Metformin after the first on next month when he finishes the supply of the med that he has. Pt has heard a lot of bad things about Metformin and he has heard of other people being taken of of [place] by their doctors.

## 2017-03-07 NOTE — Telephone Encounter (Signed)
Pt scheduled on 03/27/17

## 2017-03-26 ENCOUNTER — Telehealth: Payer: Self-pay | Admitting: Family Medicine

## 2017-03-26 ENCOUNTER — Ambulatory Visit (INDEPENDENT_AMBULATORY_CARE_PROVIDER_SITE_OTHER): Payer: Medicare Other | Admitting: *Deleted

## 2017-03-26 DIAGNOSIS — I481 Persistent atrial fibrillation: Secondary | ICD-10-CM | POA: Diagnosis not present

## 2017-03-26 DIAGNOSIS — Z5181 Encounter for therapeutic drug level monitoring: Secondary | ICD-10-CM | POA: Diagnosis not present

## 2017-03-26 DIAGNOSIS — I6350 Cerebral infarction due to unspecified occlusion or stenosis of unspecified cerebral artery: Secondary | ICD-10-CM

## 2017-03-26 DIAGNOSIS — I4891 Unspecified atrial fibrillation: Secondary | ICD-10-CM

## 2017-03-26 DIAGNOSIS — I4819 Other persistent atrial fibrillation: Secondary | ICD-10-CM

## 2017-03-26 LAB — POCT INR: INR: 2.6

## 2017-03-26 MED ORDER — ACCU-CHEK FASTCLIX LANCETS MISC: 1 refills | Status: DC

## 2017-03-26 MED ORDER — GLUCOSE BLOOD VI STRP: 1.0000 | ORAL_STRIP | Freq: Two times a day (BID) | 10 refills | Status: DC

## 2017-03-26 NOTE — Telephone Encounter (Signed)
Sent in

## 2017-03-26 NOTE — Telephone Encounter (Signed)
[facility] requesting Lancet drum for Accucheck Fastclix and Test strips for accu chek aviva plus

## 2017-03-27 ENCOUNTER — Ambulatory Visit (INDEPENDENT_AMBULATORY_CARE_PROVIDER_SITE_OTHER): Payer: Medicare Other | Admitting: Medical

## 2017-03-27 ENCOUNTER — Encounter: Payer: Self-pay | Admitting: Medical

## 2017-03-27 VITALS — BP 134/82 | HR 72 | Wt 283.4 lb

## 2017-03-27 DIAGNOSIS — E291 Testicular hypofunction: Secondary | ICD-10-CM

## 2017-03-27 DIAGNOSIS — I10 Essential (primary) hypertension: Secondary | ICD-10-CM

## 2017-03-27 DIAGNOSIS — Z2820 Immunization not carried out because of patient decision for unspecified reason: Secondary | ICD-10-CM | POA: Diagnosis not present

## 2017-03-27 DIAGNOSIS — E118 Type 2 diabetes mellitus with unspecified complications: Secondary | ICD-10-CM | POA: Diagnosis not present

## 2017-03-27 DIAGNOSIS — I6350 Cerebral infarction due to unspecified occlusion or stenosis of unspecified cerebral artery: Secondary | ICD-10-CM

## 2017-03-27 DIAGNOSIS — G4733 Obstructive sleep apnea (adult) (pediatric): Secondary | ICD-10-CM

## 2017-03-27 DIAGNOSIS — E782 Mixed hyperlipidemia: Secondary | ICD-10-CM

## 2017-03-27 DIAGNOSIS — I2510 Atherosclerotic heart disease of native coronary artery without angina pectoris: Secondary | ICD-10-CM | POA: Diagnosis not present

## 2017-03-27 LAB — POCT GLYCOSYLATED HEMOGLOBIN (HGB A1C): Hemoglobin A1C: 7.7

## 2017-03-27 MED ORDER — OMEGA-3-ACID ETHYL ESTERS 1 G PO CAPS: 2.0000 | ORAL_CAPSULE | Freq: Two times a day (BID) | ORAL | 3 refills | Status: DC

## 2017-03-27 MED ORDER — METFORMIN HCL ER 500 MG PO TB24: 1000.0000 mg | ORAL_TABLET | Freq: Every day | ORAL | 1 refills | Status: DC

## 2017-03-27 MED ORDER — EXENATIDE ER 2 MG/0.85ML ~~LOC~~ AUIJ: 2.0000 mg | AUTO-INJECTOR | SUBCUTANEOUS | 2 refills | Status: DC

## 2017-03-27 MED ORDER — AMLODIPINE BESYLATE 10 MG PO TABS: 10.0000 mg | ORAL_TABLET | Freq: Every day | ORAL | 3 refills | Status: DC

## 2017-03-27 NOTE — Progress Notes (Signed)
Subjective: Chief Complaint  Patient presents with  . Diabetes    dm check , no other cocerns    Here for routine diabetes f/u.  Checks sugars sometimes, not daily.   compliant with Kombiglyize 5/1000mg  daily but doesn't like metformin.  Hears people talking about it affecting their memory.   Denies diarrhea, nausea.    Since last visit started Praluent few weeks ago for cholesterol.  Still taking Lipitor.   Lipids and hepatic function panel 02/19/17 in chart reviewed.   PT/INR managed by coumadin clinic  Exercising with playing with 20yo son.   Diet - still drinking sweet tea, eats late in evening as he works evening hours.  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  No new c/o.   Past Medical History:  Diagnosis Date  . Atrial flutter ([place])    a. atypical atrial flutter.  . Bell's palsy   . CAD (coronary artery disease)    a. 07/2009 Cath: LM nl, LAD nl, LCX nl w/ L->R collats, chronically occluded RCA s/p failed PCI;  b. 07/2012 MV: basal inf mild ischemia.  . Central retinal artery occlusion, left eye    diagnosed in 2008  . Cerebrovascular accident [facility])    a. 03/2008 secondary to cardioembolic event;  b. chronic coumadin.  . Chronic diastolic CHF (congestive heart failure) ([place])    a. 12/2008 Echo: nl EF.  . Diabetes mellitus 11/14/2010   TYPE II  . Hematuria 05/2013   Urology, Dr. Estill Dooms  . Hyperlipidemia   . Hypertension   . Hypogonadism, male 05/2013   Dr. Estill Dooms, Urology  . Morbid obesity ([place])   . Obesity   . Obstructive sleep apnea   . Persistent atrial fibrillation ([place])    a. s/p afib ablation by [facility] 08/01/08  . Recommendation refused by patient    multiple recommended vaccines refused over the years (flu/pneumovax)  . Skin infection    chronic left leg herpetic   Current Outpatient Prescriptions on File Prior to Visit  Medication Sig Dispense Refill  . ACCU-CHEK FASTCLIX LANCETS MISC Test blood sugar bid. Dx code E11.9 102 each  1  . Alirocumab (PRALUENT) 75 MG/ML SOPN Inject 1 pen into the skin every 14 (fourteen) days. 2 pen 11  . atorvastatin (LIPITOR) 80 MG tablet Take 1 tablet (80 mg total) by mouth daily. 90 tablet 3  . b complex vitamins capsule Take 1 capsule by mouth every other day.     . Cholecalciferol (VITAMIN D3) 10000 UNITS capsule Take 1 capsule (10,000 Units total) by mouth daily. 4 capsule 0  . glucose blood (ACCU-CHEK AVIVA PLUS) test strip 1 each by Other route 2 (two) times daily. Test blood glucose twice daily. Dx Code E11.9 50 each 10  . isosorbide mononitrate (IMDUR) 30 MG 24 hr tablet take 1 tablet by mouth once daily 30 tablet 9  . losartan (COZAAR) 100 MG tablet take 1 tablet by mouth once daily 30 tablet 6  . metoprolol tartrate (LOPRESSOR) 50 MG tablet take 1 tablet by mouth twice a day 60 tablet 9  . Testosterone (ANDROGEL PUMP) 12.5 MG/ACT (1%) GEL Place 2 application onto the skin daily.     . valACYclovir (VALTREX) 500 MG tablet take 1 tablet by mouth once daily 90 tablet 1  . warfarin (COUMADIN) 5 MG tablet TAKE AS DIRECTED 45 tablet 3   No current facility-administered medications on file prior to visit.    Past Surgical History:  Procedure Laterality Date  . ATRIAL ABLATION SURGERY  08/01/08   CTI and PVI ablation by JA  . COLONOSCOPY     2012 per patient  . ELECTROPHYSIOLOGIC STUDY N/A 11/30/2014   Procedure: Atrial Fibrillation Ablation;  Surgeon: [name], MD;  Location: [place] CV LAB;  Service: Cardiovascular;  Laterality: N/A;  . TEE WITHOUT CARDIOVERSION N/A 11/30/2014   Procedure: TRANSESOPHAGEAL ECHOCARDIOGRAM (TEE);  Surgeon: Josue Hector, MD;  Location: [facility] ENDOSCOPY;  Service: Cardiovascular;  Laterality: N/A;    ROS as in subjective     Objective BP 134/82   Pulse 72   Wt 283 lb 6.4 oz (128.5 kg)   SpO2 96%   BMI 40.09 kg/m   Wt Readings from Last 3 Encounters:  03/27/17 283 lb 6.4 oz (128.5 kg)  01/07/17 287 lb 3.2 oz (130.3 kg)  12/20/16 284  lb (128.8 kg)   Gen: wd, wn, nad Obese white male Heart rrr, normal s1, s2, no murmurs Lungs clear Ext: no edema Pulses WNL      Assessment: Encounter Diagnoses  Name Primary?  . Diabetes mellitus with complication ([place]) Yes  . Coronary artery disease involving native coronary artery of native heart without angina pectoris   . Essential hypertension, benign   . OSA (obstructive sleep apnea)   . Hypogonadism male   . Mixed dyslipidemia   . Vaccine refused by patient   . Morbid obesity ([place])     Plan: Hgb A1C 7.7% today.  Not at goal.   Counseled on diet, exercise, STOP Kombilgyze.  Begin Glucophage XR, begin B-Cise pen.  Demonstrated proper use.   Gave 1st dose today for weekly use. C/t glucose monitoring.   Sent refill on strips.   F/u 76mo.  HTN - compliant with medication, c/t same medications  OSA - c/t CPAP  hypogonadism - TST managed by urology, Dr. Estill Dooms in [facility]  dyslipidemia - much improved with recent addition of Praluent.  Obesity - counseled on diet, exercise  He declines vaccines in general.  Hezzie was seen today for diabetes.  Diagnoses and all orders for this visit:  Diabetes mellitus with complication ([place]) -     HgB A1c  Coronary artery disease involving native coronary artery of native heart without angina pectoris  Essential hypertension, benign  OSA (obstructive sleep apnea)  Hypogonadism male  Mixed dyslipidemia  Vaccine refused by patient  Morbid obesity ([place])  Other orders -     metFORMIN (GLUCOPHAGE XR) 500 MG 24 hr tablet; Take 2 tablets (1,000 mg total) by mouth daily with breakfast. -     Exenatide ER (BYDUREON BCISE) 2 MG/0.85ML AUIJ; Inject 2 mg into the skin once a week. -     amLODipine (NORVASC) 10 MG tablet; Take 1 tablet (10 mg total) by mouth daily. -     omega-3 acid ethyl esters (LOVAZA) 1 g capsule; Take 2 capsules (2 g total) by mouth 2 (two) times daily.

## 2017-04-08 ENCOUNTER — Telehealth: Payer: Self-pay | Admitting: Medical

## 2017-04-08 NOTE — Telephone Encounter (Signed)
P.A. BYDUREON, scanned A1C and enclosed with P.A.

## 2017-04-10 NOTE — Telephone Encounter (Signed)
Pt called and stated that his b/s are running at 199 and 200 since he has been on the  New injection and wants to know what to do next ?

## 2017-04-11 NOTE — Telephone Encounter (Signed)
What morning sugars was he seeing before we switched?  Verify he is taking Metformin /Glucophage XR that we just started?   He is using the 2mg  weekly B-Cise injection? And he stopped Kombiglyze?  Does he have the B-cise prescription or samples so he doesn't run out from last visit?

## 2017-04-11 NOTE — Telephone Encounter (Signed)
Called andl/m for pt to call us back. 

## 2017-04-12 ENCOUNTER — Other Ambulatory Visit: Payer: Self-pay | Admitting: Medical

## 2017-04-12 MED ORDER — INSULIN DEGLUDEC 100 UNIT/ML ~~LOC~~ SOPN: 10.0000 [IU] | PEN_INJECTOR | Freq: Every day | SUBCUTANEOUS | 2 refills | Status: DC

## 2017-04-12 NOTE — Telephone Encounter (Signed)
Spoke with pt about he said that his numbers are running 186 this morming fasting and 165 yesterday fasting , he is not taking the Kombiglyze. He is said that he is taking the metformin 1,000mg  once a day and the b-cise once a week. Pt will pick up his b-cise today but he is not  Out of sample.

## 2017-04-12 NOTE — Telephone Encounter (Signed)
C/t B-crise for now, c/t Metformin for now.   If not seeing fasting morning glucose < 150 after another week, then add Tresiba pen 10u QHS long acting insulin at bedtime as well to get sugars under better control.   Plan f/u 79mo

## 2017-04-14 NOTE — Telephone Encounter (Signed)
P.A. Approved til 07/22/17 

## 2017-04-15 ENCOUNTER — Telehealth: Payer: Self-pay | Admitting: Medical

## 2017-04-15 NOTE — Telephone Encounter (Signed)
Called andl/m for pt to call us back. 

## 2017-04-15 NOTE — Telephone Encounter (Signed)
Pt states picked up [place] & it didn't have needles with it and he didn't realize it until he got to [place] & he would like pen needles called into [facility] in [place] T# [phone].  Called into pharmacist box of 100.  Pt informed.

## 2017-04-23 ENCOUNTER — Other Ambulatory Visit: Payer: Self-pay | Admitting: Internal Medicine

## 2017-04-26 ENCOUNTER — Other Ambulatory Visit: Payer: Self-pay | Admitting: Cardiology

## 2017-04-27 ENCOUNTER — Other Ambulatory Visit: Payer: Self-pay | Admitting: Medical

## 2017-04-30 ENCOUNTER — Ambulatory Visit (INDEPENDENT_AMBULATORY_CARE_PROVIDER_SITE_OTHER): Payer: Medicare Other | Admitting: Ophthalmology

## 2017-04-30 DIAGNOSIS — H348122 Central retinal vein occlusion, left eye, stable: Secondary | ICD-10-CM | POA: Diagnosis not present

## 2017-04-30 DIAGNOSIS — I10 Essential (primary) hypertension: Secondary | ICD-10-CM

## 2017-04-30 DIAGNOSIS — E11319 Type 2 diabetes mellitus with unspecified diabetic retinopathy without macular edema: Secondary | ICD-10-CM | POA: Diagnosis not present

## 2017-04-30 DIAGNOSIS — H2513 Age-related nuclear cataract, bilateral: Secondary | ICD-10-CM | POA: Diagnosis not present

## 2017-04-30 DIAGNOSIS — H35033 Hypertensive retinopathy, bilateral: Secondary | ICD-10-CM

## 2017-04-30 DIAGNOSIS — H43813 Vitreous degeneration, bilateral: Secondary | ICD-10-CM

## 2017-04-30 DIAGNOSIS — E113392 Type 2 diabetes mellitus with moderate nonproliferative diabetic retinopathy without macular edema, left eye: Secondary | ICD-10-CM

## 2017-05-07 ENCOUNTER — Ambulatory Visit (INDEPENDENT_AMBULATORY_CARE_PROVIDER_SITE_OTHER): Payer: Medicare Other | Admitting: *Deleted

## 2017-05-07 DIAGNOSIS — I6350 Cerebral infarction due to unspecified occlusion or stenosis of unspecified cerebral artery: Secondary | ICD-10-CM | POA: Diagnosis not present

## 2017-05-07 DIAGNOSIS — I4891 Unspecified atrial fibrillation: Secondary | ICD-10-CM

## 2017-05-07 DIAGNOSIS — Z5181 Encounter for therapeutic drug level monitoring: Secondary | ICD-10-CM

## 2017-05-07 DIAGNOSIS — I4819 Other persistent atrial fibrillation: Secondary | ICD-10-CM

## 2017-05-07 LAB — POCT INR: INR: 1.8

## 2017-06-26 ENCOUNTER — Other Ambulatory Visit: Payer: Self-pay | Admitting: Medical

## 2017-07-02 NOTE — Telephone Encounter (Signed)
P.A. Approved per Cover my meds 

## 2017-07-09 ENCOUNTER — Other Ambulatory Visit: Payer: Self-pay | Admitting: Medical

## 2017-07-09 NOTE — Telephone Encounter (Signed)
Is this ok to refill?  

## 2017-07-09 NOTE — Telephone Encounter (Signed)
Pt left message needs refill insulin and 32g Novofine+ needle for flexpen.  Called pt and left message.   [facility] because pt has refills already in his chart. Called [facility] & they state [name] was transferred to other [facility] they think was because they didn't have in stock because he has had several other Rx's filled there at this [facility] this month already.  So I called in needles to this [facility] and they have the refills for the [place].

## 2017-07-22 ENCOUNTER — Other Ambulatory Visit: Payer: Self-pay | Admitting: Medical

## 2017-07-22 ENCOUNTER — Other Ambulatory Visit: Payer: Self-pay | Admitting: Internal Medicine

## 2017-07-27 ENCOUNTER — Telehealth: Payer: Self-pay | Admitting: Medical

## 2017-07-27 NOTE — Telephone Encounter (Signed)
Recv'd renewal for P.A. KOMBIGLYZE, reviewed chart and this was discontinued 03/27/17

## 2017-07-30 ENCOUNTER — Encounter: Payer: Self-pay | Admitting: Medical

## 2017-07-30 ENCOUNTER — Ambulatory Visit (INDEPENDENT_AMBULATORY_CARE_PROVIDER_SITE_OTHER): Payer: Medicare Other | Admitting: Medical

## 2017-07-30 VITALS — BP 136/80 | HR 84 | Wt 281.0 lb

## 2017-07-30 DIAGNOSIS — E782 Mixed hyperlipidemia: Secondary | ICD-10-CM | POA: Diagnosis not present

## 2017-07-30 DIAGNOSIS — D126 Benign neoplasm of colon, unspecified: Secondary | ICD-10-CM

## 2017-07-30 DIAGNOSIS — I10 Essential (primary) hypertension: Secondary | ICD-10-CM

## 2017-07-30 DIAGNOSIS — G4733 Obstructive sleep apnea (adult) (pediatric): Secondary | ICD-10-CM | POA: Diagnosis not present

## 2017-07-30 DIAGNOSIS — E119 Type 2 diabetes mellitus without complications: Secondary | ICD-10-CM | POA: Diagnosis not present

## 2017-07-30 DIAGNOSIS — Z125 Encounter for screening for malignant neoplasm of prostate: Secondary | ICD-10-CM | POA: Diagnosis not present

## 2017-07-30 DIAGNOSIS — E118 Type 2 diabetes mellitus with unspecified complications: Secondary | ICD-10-CM | POA: Diagnosis not present

## 2017-07-30 DIAGNOSIS — N529 Male erectile dysfunction, unspecified: Secondary | ICD-10-CM

## 2017-07-30 DIAGNOSIS — E559 Vitamin D deficiency, unspecified: Secondary | ICD-10-CM | POA: Diagnosis not present

## 2017-07-30 DIAGNOSIS — I5032 Chronic diastolic (congestive) heart failure: Secondary | ICD-10-CM | POA: Diagnosis not present

## 2017-07-30 DIAGNOSIS — E291 Testicular hypofunction: Secondary | ICD-10-CM | POA: Diagnosis not present

## 2017-07-30 DIAGNOSIS — I2510 Atherosclerotic heart disease of native coronary artery without angina pectoris: Secondary | ICD-10-CM | POA: Diagnosis not present

## 2017-07-30 LAB — COMPREHENSIVE METABOLIC PANEL
AG Ratio: 1.7 (calc) (ref 1.0–2.5)
ALT: 26 U/L (ref 9–46)
AST: 22 U/L (ref 10–35)
Albumin: 4.8 g/dL (ref 3.6–5.1)
Alkaline phosphatase (APISO): 80 U/L (ref 40–115)
BUN: 16 mg/dL (ref 7–25)
CO2: 29 mmol/L (ref 20–32)
Calcium: 9.7 mg/dL (ref 8.6–10.3)
Chloride: 100 mmol/L (ref 98–110)
Creat: 0.95 mg/dL (ref 0.70–1.25)
Globulin: 2.9 g/dL (calc) (ref 1.9–3.7)
Glucose, Bld: 124 mg/dL — ABNORMAL HIGH (ref 65–99)
Potassium: 4.7 mmol/L (ref 3.5–5.3)
Sodium: 137 mmol/L (ref 135–146)
Total Bilirubin: 0.5 mg/dL (ref 0.2–1.2)
Total Protein: 7.7 g/dL (ref 6.1–8.1)

## 2017-07-30 LAB — CBC
HCT: 45.8 % (ref 38.5–50.0)
Hemoglobin: 15.4 g/dL (ref 13.2–17.1)
MCH: 29.2 pg (ref 27.0–33.0)
MCHC: 33.6 g/dL (ref 32.0–36.0)
MCV: 86.7 fL (ref 80.0–100.0)
MPV: 11.2 fL (ref 7.5–12.5)
Platelets: 253 10*3/uL (ref 140–400)
RBC: 5.28 10*6/uL (ref 4.20–5.80)
RDW: 13.6 % (ref 11.0–15.0)
WBC: 8.3 10*3/uL (ref 3.8–10.8)

## 2017-07-30 LAB — LIPID PANEL
Cholesterol: 147 mg/dL (ref <200)
HDL: 45 mg/dL (ref >40)
LDL Cholesterol (Calc): 65 mg/dL (calc)
Non-HDL Cholesterol (Calc): 102 mg/dL (calc) (ref <130)
Total CHOL/HDL Ratio: 3.3 (calc) (ref <5.0)
Triglycerides: 278 mg/dL — ABNORMAL HIGH (ref <150)

## 2017-07-30 LAB — POCT GLYCOSYLATED HEMOGLOBIN (HGB A1C): Hemoglobin A1C: 7.3

## 2017-07-30 LAB — PSA: PSA: 0.4 ng/mL (ref <=4.0)

## 2017-07-30 NOTE — Progress Notes (Signed)
Subjective: Chief Complaint  Patient presents with  . Diabetes    dm check    Medical Team: Dr. Golden Hurter, cardiology Dr. [name], cardiology Dr. Estill Dooms, Urology Dr. Zigmund Daniel, dentist Dentist Dr. Carol Ada, GI Alcus Bradly, Camelia Eng, PA-C  Here for routine med check.  had colonoscopy this past year in 08/2016 with Dr. Benson Norway.  Had tubular adenoma per results reviewed.  Diabetes-Checking sugars, logging them but didn't bring his numbers.   Getting 120-140s fasting. Exercising - very little.  He continues metformin XR 500 mg 2 tablets daily and Tresiba 10 u daily and Bydureon B-cise 2 mg weekly  Mixed dyslipidemia-she reports compliance with Lipitor 80, fish oil, and Praluent injections started a few months ago by cardiology  OSA-he notes compliance with CPAP  Hypertension- compliant with amlodipine 10 mg, losartan 100 mg, metoprolol 50mg  twice a day  He is on AndroGel testosterone through his urologist  His Coumadin is managed by the Coumadin clinic  Past Medical History:  Diagnosis Date  . Atrial flutter ([place])    a. atypical atrial flutter.  . Bell's palsy   . CAD (coronary artery disease)    a. 07/2009 Cath: LM nl, LAD nl, LCX nl w/ L->R collats, chronically occluded RCA s/p failed PCI;  b. 07/2012 MV: basal inf mild ischemia.  . Central retinal artery occlusion, left eye    diagnosed in 2008  . Cerebrovascular accident [facility])    a. 03/2008 secondary to cardioembolic event;  b. chronic coumadin.  . Chronic diastolic CHF (congestive heart failure) ([place])    a. 12/2008 Echo: nl EF.  . Diabetes mellitus 11/14/2010   TYPE II  . Hematuria 05/2013   Urology, Dr. Estill Dooms  . Hyperlipidemia   . Hypertension   . Hypogonadism, male 05/2013   Dr. Estill Dooms, Urology  . Morbid obesity ([place])   . Obesity   . Obstructive sleep apnea   . Persistent atrial fibrillation ([place])    a. s/p afib ablation by [facility] 08/01/08  . Recommendation refused by patient    multiple recommended vaccines  refused over the years (flu/pneumovax)  . Skin infection    chronic left leg herpetic   Current Outpatient Medications on File Prior to Visit  Medication Sig Dispense Refill  . ACCU-CHEK FASTCLIX LANCETS MISC Test blood sugar bid. Dx code E11.9 102 each 1  . Alirocumab (PRALUENT) 75 MG/ML SOPN Inject 1 pen into the skin every 14 (fourteen) days. 2 pen 11  . amLODipine (NORVASC) 10 MG tablet Take 1 tablet (10 mg total) by mouth daily. 90 tablet 3  . atorvastatin (LIPITOR) 80 MG tablet Take 1 tablet (80 mg total) by mouth daily. 90 tablet 3  . b complex vitamins capsule Take 1 capsule by mouth every other day.     [name] BYDUREON BCISE 2 MG/0.85ML AUIJ INJECT 2 MG INTO THE SKIN ONCE A WEEK. 3.4 mL 1  . Cholecalciferol (VITAMIN D3) 10000 UNITS capsule Take 1 capsule (10,000 Units total) by mouth daily. 4 capsule 0  . glucose blood (ACCU-CHEK AVIVA PLUS) test strip 1 each by Other route 2 (two) times daily. Test blood glucose twice daily. Dx Code E11.9 50 each 10  . isosorbide mononitrate (IMDUR) 30 MG 24 hr tablet take 1 tablet by mouth once daily 30 tablet 9  . losartan (COZAAR) 100 MG tablet take 1 tablet by mouth once daily 30 tablet 6  . metFORMIN (GLUCOPHAGE XR) 500 MG 24 hr tablet Take 2 tablets (1,000 mg total) by mouth  daily with breakfast. 180 tablet 1  . metoprolol tartrate (LOPRESSOR) 50 MG tablet take 1 tablet by mouth twice a day 60 tablet 9  . omega-3 acid ethyl esters (LOVAZA) 1 g capsule Take 2 capsules (2 g total) by mouth 2 (two) times daily. 360 capsule 3  . Testosterone (ANDROGEL PUMP) 12.5 MG/ACT (1%) GEL Place 2 application onto the skin daily.     . TRESIBA FLEXTOUCH 100 UNIT/ML SOPN FlexTouch Pen INJECT 10 UNITS INTO THE SKIN DAILY AT 10 PM 3 mL 2  . valACYclovir (VALTREX) 500 MG tablet take 1 tablet by mouth once daily 90 tablet 1  . warfarin (COUMADIN) 5 MG tablet TAKE AS DIRECTED 45 tablet 3   No current facility-administered medications on file prior to visit.    ROS as  in subjective    Objective: BP 136/80   Pulse 84   Wt 281 lb (127.5 kg)   SpO2 96%   BMI 39.75 kg/m   Wt Readings from Last 3 Encounters:  07/30/17 281 lb (127.5 kg)  03/27/17 283 lb 6.4 oz (128.5 kg)  01/07/17 287 lb 3.2 oz (130.3 kg)     General appearance: alert, no distress, WD/WN, white obese male Neck: supple, no lymphadenopathy, no thyromegaly, no masses, no bruits Heart: RRR, normal S1, S2, no murmurs Lungs: CTA bilaterally, no wheezes, rhonchi, or rales Abdomen: +bs, soft, non tender, non distended, no masses, no hepatomegaly, no splenomegaly Pulses: 2+ symmetric, upper and lower extremities, normal cap refill Ext: no edema  Diabetic Foot Exam - Simple   Simple Foot Form Diabetic Foot exam was performed with the following findings:  Yes 07/31/2017  8:14 AM  Visual Inspection No deformities, no ulcerations, no other skin breakdown bilaterally:  Yes Sensation Testing Intact to touch and monofilament testing bilaterally:  Yes Pulse Check See comments:  Yes Comments 1+ pedal pulses , normal cap refill       Assessment: Encounter Diagnoses  Name Primary?  . Diabetes mellitus with complication ([place]) Yes  . Diabetes mellitus without complication ([place])   . Essential hypertension, benign   . Tubular adenoma of colon   . OSA (obstructive sleep apnea)   . Coronary artery disease involving native coronary artery of native heart without angina pectoris   . Chronic diastolic heart failure ([place])   . Hypogonadism male   . Impotence of organic origin   . Mixed dyslipidemia   . Morbid obesity ([place])   . Vitamin D deficiency   . Screening for prostate cancer      Plan: Diabetes with complications including mixed dyslipidemia heart disease, hypertension-routine labs today, hemoglobin A1c 7.3%.  Advised daily foot checks, routine follow-up as usual, continue glucose monitoring but needs to be better about bringing in his readings  I am happy he has lost some weight  but he could do a lot more in terms of diet and exercise as discussed  He was started on Praluent a few months ago, fasting lab today  Sleep apnea-continue CPAP  Update PSA lab today.  He declines prostate exam.  I reviewed his 2018 colonoscopy report showing tubular adenoma  Follow-up with specialist as usual  Telvin was seen today for diabetes.  Diagnoses and all orders for this visit:  Diabetes mellitus with complication ([place]) -     Comprehensive metabolic panel -     CBC -     HM DIABETES FOOT EXAM -     HM DIABETES EYE EXAM  Diabetes mellitus without complication (  [place]) Comments: error Orders: -     HgB A1c  Essential hypertension, benign -     Lipid panel -     CBC  Tubular adenoma of colon  OSA (obstructive sleep apnea)  Coronary artery disease involving native coronary artery of native heart without angina pectoris  Chronic diastolic heart failure ([place])  Hypogonadism male  Impotence of organic origin  Mixed dyslipidemia -     Lipid panel  Morbid obesity (H[facility])  Vitamin D deficiency  Screening for prostate cancer -     CBC -     PSA

## 2017-07-31 ENCOUNTER — Other Ambulatory Visit: Payer: Self-pay | Admitting: Medical

## 2017-07-31 MED ORDER — ATORVASTATIN CALCIUM 80 MG PO TABS: 80.0000 mg | ORAL_TABLET | Freq: Every day | ORAL | 3 refills | Status: DC

## 2017-07-31 MED ORDER — METFORMIN HCL ER 500 MG PO TB24: 1000.0000 mg | ORAL_TABLET | Freq: Every day | ORAL | 1 refills | Status: DC

## 2017-07-31 MED ORDER — EXENATIDE ER 2 MG/0.85ML ~~LOC~~ AUIJ: 2.0000 mg | AUTO-INJECTOR | SUBCUTANEOUS | 5 refills | Status: DC

## 2017-08-01 ENCOUNTER — Ambulatory Visit (INDEPENDENT_AMBULATORY_CARE_PROVIDER_SITE_OTHER): Payer: Medicare Other | Admitting: *Deleted

## 2017-08-01 DIAGNOSIS — I481 Persistent atrial fibrillation: Secondary | ICD-10-CM | POA: Diagnosis not present

## 2017-08-01 DIAGNOSIS — I4891 Unspecified atrial fibrillation: Secondary | ICD-10-CM

## 2017-08-01 DIAGNOSIS — I6350 Cerebral infarction due to unspecified occlusion or stenosis of unspecified cerebral artery: Secondary | ICD-10-CM | POA: Diagnosis not present

## 2017-08-01 DIAGNOSIS — I4819 Other persistent atrial fibrillation: Secondary | ICD-10-CM

## 2017-08-01 DIAGNOSIS — Z5181 Encounter for therapeutic drug level monitoring: Secondary | ICD-10-CM

## 2017-08-01 LAB — POCT INR: INR: 1.9

## 2017-08-01 NOTE — Progress Notes (Signed)
Cardiology Office Note:    Date:  08/02/2017   ID:  [name], DOB Mar 15, 1951, MRN [id number]  PCP:  Carlena Hurl, PA-C  Cardiologist:  No primary care provider on file.    Referring MD: Carlena Hurl, PA-C   Chief Complaint  Patient presents with  . Coronary Artery Disease  . Hypertension  . Sleep Apnea  . Atrial Fibrillation    History of Present Illness:    [name] is a 67 y.o. male with a hx of with a history of ASCAD with chronically occluded RCA s/p failed PCI with L>>R collaterals, HTN, OSA on CPAP, morbid obesity, 2persistent AF s/p afib ablation but unable to ablate atrial flutter and dyslipidemia.   He is here today for followup and is doing well.  He denies any chest pain or pressure, PND, orthopnea, LE edema, dizziness, palpitations or syncope. He has chronic DOE which is stable.   He is compliant with his meds and is tolerating meds with no SE.  He is doing well with his CPAP device.  HE tolerates the nasal mask and feels the pressure is adequate.  Since going on CPAP he feels rested in the am and has no significant daytime sleepiness.  He denies any significant mouth or nasal dryness or nasal congestion.  He does not think that he snores.     Past Medical History:  Diagnosis Date  . Atrial flutter ([place])    a. atypical atrial flutter.  . Bell's palsy   . CAD (coronary artery disease)    a. 07/2009 Cath: LM nl, LAD nl, LCX nl w/ L->R collats, chronically occluded RCA s/p failed PCI;  b. 07/2012 MV: basal inf mild ischemia.  . Central retinal artery occlusion, left eye    diagnosed in 2008  . Cerebrovascular accident [facility])    a. 03/2008 secondary to cardioembolic event;  b. chronic coumadin.  . Chronic diastolic CHF (congestive heart failure) ([place])    a. 12/2008 Echo: nl EF.  . Diabetes mellitus 11/14/2010   TYPE II  . Hematuria 05/2013   Urology, Dr. Estill Dooms  . Hyperlipidemia   . Hypertension   . Hypogonadism, male 05/2013   Dr. Estill Dooms, Urology  .  Morbid obesity ([place])   . Obesity   . Obstructive sleep apnea   . Persistent atrial fibrillation ([place])    a. s/p afib ablation by [facility] 08/01/08  . Recommendation refused by patient    multiple recommended vaccines refused over the years (flu/pneumovax)  . Skin infection    chronic left leg herpetic    Past Surgical History:  Procedure Laterality Date  . ATRIAL ABLATION SURGERY  08/01/08   CTI and PVI ablation by [facility]  . COLONOSCOPY     2012 per patient  . ELECTROPHYSIOLOGIC STUDY N/A 11/30/2014   Procedure: Atrial Fibrillation Ablation;  Surgeon: [name], MD;  Location: [facility];  Service: Cardiovascular;  Laterality: N/A;  . TEE WITHOUT CARDIOVERSION N/A 11/30/2014   Procedure: TRANSESOPHAGEAL ECHOCARDIOGRAM (TEE);  Surgeon: Josue Hector, MD;  Location: [facility] ENDOSCOPY;  Service: Cardiovascular;  Laterality: N/A;    Current Medications: Current Meds  Medication Sig  . ACCU-CHEK FASTCLIX LANCETS MISC Test blood sugar bid. Dx code E11.9  . Alirocumab (PRALUENT) 75 MG/ML SOPN Inject 1 pen into the skin every 14 (fourteen) days.  [name] amLODipine (NORVASC) 10 MG tablet Take 1 tablet (10 mg total) by mouth daily.  [name] atorvastatin (LIPITOR) 80 MG tablet Take 1 tablet (80 mg  total) by mouth daily.  [name] b complex vitamins capsule Take 1 capsule by mouth every other day.   . Cholecalciferol (VITAMIN D3) 10000 UNITS capsule Take 1 capsule (10,000 Units total) by mouth daily.  . Exenatide ER (BYDUREON BCISE) 2 MG/0.85ML AUIJ Inject 2 mg into the skin once a week.  [name] glucose blood (ACCU-CHEK AVIVA PLUS) test strip 1 each by Other route 2 (two) times daily. Test blood glucose twice daily. Dx Code E11.9  . isosorbide mononitrate (IMDUR) 30 MG 24 hr tablet take 1 tablet by mouth once daily  . losartan (COZAAR) 100 MG tablet take 1 tablet by mouth once daily  . metFORMIN (GLUCOPHAGE XR) 500 MG 24 hr tablet Take 2 tablets (1,000 mg total) by mouth daily with breakfast.  . metoprolol tartrate  (LOPRESSOR) 50 MG tablet take 1 tablet by mouth twice a day  . omega-3 acid ethyl esters (LOVAZA) 1 g capsule Take 2 capsules (2 g total) by mouth 2 (two) times daily.  . Testosterone (ANDROGEL PUMP) 12.5 MG/ACT (1%) GEL Place 2 application onto the skin daily.   . TRESIBA FLEXTOUCH 100 UNIT/ML SOPN FlexTouch Pen INJECT 10 UNITS INTO THE SKIN DAILY AT 10 PM  . valACYclovir (VALTREX) 500 MG tablet take 1 tablet by mouth once daily  . warfarin (COUMADIN) 5 MG tablet TAKE AS DIRECTED     Allergies:   Crestor [rosuvastatin]; Zetia [ezetimibe]; Lisinopril; and [place] [dapagliflozin]   Social History   Socioeconomic History  . Marital status: Married    Spouse name: None  . Number of children: None  . Years of education: None  . Highest education level: None  Social Needs  . Financial resource strain: None  . Food insecurity - worry: None  . Food insecurity - inability: None  . Transportation needs - medical: None  . Transportation needs - non-medical: None  Occupational History  . None  Tobacco Use  . Smoking status: Former Smoker    Last attempt to quit: 07/24/1979    Years since quitting: 38.0  . Smokeless tobacco: Never Used  Substance and Sexual Activity  . Alcohol use: No    Comment: former  . Drug use: No    Comment: former  . Sexual activity: None  Other Topics Concern  . None  Social History Narrative   Lives in [place], [place]. With his spouse and son. Has remote history of tobacco, but quit 30 years ago. Has heavy history of alcohol consumption but quit 30 years ago. Previously used marijuana and cocaine, but denies use over the past 30 years. 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      Family History: The patient's family history includes Heart disease in his father and mother; Stroke in his father.  ROS:   Please see the history of present illness.    ROS  All other systems reviewed and negative.   EKGs/Labs/Other Studies Reviewed:    The following  studies were reviewed today: CPAP download  EKG:  EKG is not ordered today.    Recent Labs: 07/30/2017: ALT 26; BUN 16; Creat 0.95; Hemoglobin 15.4; Platelets 253; Potassium 4.7; Sodium 137   Recent Lipid Panel    Component Value Date/Time   CHOL 147 07/30/2017 1050   CHOL 106 02/19/2017 0945   TRIG 278 (H) 07/30/2017 1050   HDL 45 07/30/2017 1050   HDL 42 02/19/2017 0945   CHOLHDL 3.3 07/30/2017 1050   VLDL 44 (H) 09/06/2016 1020   LDLCALC 35 02/19/2017 0945  LDLDIRECT 87.7 05/08/2013 0837    Physical Exam:    VS:  BP 130/80   Pulse 80   Ht 5' 10.5" (1.791 m)   Wt 280 lb 1.9 oz (127.1 kg)   BMI 39.62 kg/m     Wt Readings from Last 3 Encounters:  08/02/17 280 lb 1.9 oz (127.1 kg)  07/30/17 281 lb (127.5 kg)  03/27/17 283 lb 6.4 oz (128.5 kg)     GEN:  Well nourished, well developed in no acute distress HEENT: Normal NECK: No JVD; No carotid bruits LYMPHATICS: No lymphadenopathy CARDIAC: RRR, no murmurs, rubs, gallops RESPIRATORY:  Clear to auscultation without rales, wheezing or rhonchi  ABDOMEN: Soft, non-tender, non-distended MUSCULOSKELETAL:  No edema; No deformity  SKIN: Warm and dry NEUROLOGIC:  Alert and oriented x 3 PSYCHIATRIC:  Normal affect   ASSESSMENT:    1. Persistent atrial fibrillation ([place])   2. Essential hypertension, benign   3. Coronary artery disease involving native coronary artery of native heart without angina pectoris   4. Chronic diastolic heart failure ([place])   5. OSA (obstructive sleep apnea)   6. Mixed dyslipidemia    PLAN:    In order of problems listed above:  1.  Persistent atrial fibrillation - he is s/p afib ablation but unable to ablate atypical flutter.  He is maintaining NSR of AAD therapy.  He will continue on Lopressor 50mg  BID and warfarin.    2.  HTN - BP is well controlled on current meds.  He will continue on amlodipine 10mg  daily, Metoprolol 50mg  BID and Losartan 100mg  daily.    3.  ASCAD - cath with  chronically occluded RCA s/p failed PCI with L>>R collaterals.  He denies any anginal symptoms. He will continue on BB, long acting nitrates and statin.  He is not on ASA due to warfarin.    4.  Chronic diastolic CHF - he appears euvolemic on exam today.  His weight is stable.  He will continue on BB and low sodium diet.  5.  OSA - the patient is tolerating PAP therapy well without any problems. The PAP download was reviewed today and showed an AHI of 0.5/hr on 16 cm H2O with 100% compliance in using more than 4 hours nightly.  The patient has been using and benefiting from PAP use and will continue to benefit from therapy.   6.  Hyperlipidemia - LDL goal is < 70.  His LDL earlier this week was 65.  He will continue on statin.    Medication Adjustments/Labs and Tests Ordered: Current medicines are reviewed at length with the patient today.  Concerns regarding medicines are outlined above.  No orders of the defined types were placed in this encounter.  No orders of the defined types were placed in this encounter.   Signed, Fransico Him, MD  08/02/2017 11:44 AM    [place]

## 2017-08-01 NOTE — Patient Instructions (Signed)
Description   Today take when you get home take another 1/2 tablet, then continue on same dosage 1.5 tablets every day except 1 tablet on Sundays and Thursdays.  Recheck INR in 4 weeks.  Call coumadin clinic at [phone]

## 2017-08-02 ENCOUNTER — Ambulatory Visit (INDEPENDENT_AMBULATORY_CARE_PROVIDER_SITE_OTHER): Payer: Medicare Other | Admitting: Cardiology

## 2017-08-02 ENCOUNTER — Other Ambulatory Visit: Payer: Self-pay

## 2017-08-02 ENCOUNTER — Encounter: Payer: Self-pay | Admitting: Cardiology

## 2017-08-02 VITALS — BP 130/80 | HR 80 | Ht 70.5 in | Wt 280.1 lb

## 2017-08-02 DIAGNOSIS — I5032 Chronic diastolic (congestive) heart failure: Secondary | ICD-10-CM

## 2017-08-02 DIAGNOSIS — I481 Persistent atrial fibrillation: Secondary | ICD-10-CM | POA: Diagnosis not present

## 2017-08-02 DIAGNOSIS — I2510 Atherosclerotic heart disease of native coronary artery without angina pectoris: Secondary | ICD-10-CM

## 2017-08-02 DIAGNOSIS — I10 Essential (primary) hypertension: Secondary | ICD-10-CM

## 2017-08-02 DIAGNOSIS — G4733 Obstructive sleep apnea (adult) (pediatric): Secondary | ICD-10-CM

## 2017-08-02 DIAGNOSIS — I6350 Cerebral infarction due to unspecified occlusion or stenosis of unspecified cerebral artery: Secondary | ICD-10-CM | POA: Diagnosis not present

## 2017-08-02 DIAGNOSIS — E782 Mixed hyperlipidemia: Secondary | ICD-10-CM | POA: Diagnosis not present

## 2017-08-02 DIAGNOSIS — I4819 Other persistent atrial fibrillation: Secondary | ICD-10-CM

## 2017-08-02 NOTE — Patient Instructions (Signed)

## 2017-08-09 ENCOUNTER — Other Ambulatory Visit: Payer: Self-pay | Admitting: *Deleted

## 2017-08-09 MED ORDER — WARFARIN SODIUM 5 MG PO TABS: ORAL_TABLET | ORAL | 3 refills | Status: DC

## 2017-08-09 MED ORDER — WARFARIN SODIUM 5 MG PO TABS: ORAL_TABLET | ORAL | 1 refills | Status: DC

## 2017-08-09 NOTE — Telephone Encounter (Signed)
Spoke with pt and he wants coumadin refill to go to [facility] in [place] so order that had been sent to [facility] in [place] was cancelled

## 2017-08-13 DIAGNOSIS — N5201 Erectile dysfunction due to arterial insufficiency: Secondary | ICD-10-CM | POA: Diagnosis not present

## 2017-08-23 ENCOUNTER — Other Ambulatory Visit: Payer: Self-pay

## 2017-08-23 MED ORDER — INSULIN DEGLUDEC 100 UNIT/ML ~~LOC~~ SOPN: PEN_INJECTOR | SUBCUTANEOUS | 2 refills | Status: DC

## 2017-08-23 MED ORDER — EXENATIDE ER 2 MG/0.85ML ~~LOC~~ AUIJ: 2.0000 mg | AUTO-INJECTOR | SUBCUTANEOUS | 5 refills | Status: DC

## 2017-08-25 ENCOUNTER — Telehealth: Payer: Self-pay | Admitting: Medical

## 2017-08-25 NOTE — Telephone Encounter (Signed)
Recv'd P.A. FARXIGA per chart this was discontinued 2/18

## 2017-08-29 ENCOUNTER — Ambulatory Visit (INDEPENDENT_AMBULATORY_CARE_PROVIDER_SITE_OTHER): Payer: Medicare Other | Admitting: Pharmacist

## 2017-08-29 DIAGNOSIS — I481 Persistent atrial fibrillation: Secondary | ICD-10-CM

## 2017-08-29 DIAGNOSIS — I4891 Unspecified atrial fibrillation: Secondary | ICD-10-CM | POA: Diagnosis not present

## 2017-08-29 DIAGNOSIS — I6350 Cerebral infarction due to unspecified occlusion or stenosis of unspecified cerebral artery: Secondary | ICD-10-CM | POA: Diagnosis not present

## 2017-08-29 DIAGNOSIS — I4819 Other persistent atrial fibrillation: Secondary | ICD-10-CM

## 2017-08-29 DIAGNOSIS — Z5181 Encounter for therapeutic drug level monitoring: Secondary | ICD-10-CM

## 2017-08-29 LAB — POCT INR: INR: 2

## 2017-08-29 NOTE — Patient Instructions (Signed)
Description   Continue on same dosage 1.5 tablets every day except 1 tablet on Sundays and Thursdays.  Recheck INR in 4 weeks.  Call coumadin clinic at [phone]

## 2017-09-12 ENCOUNTER — Other Ambulatory Visit: Payer: Self-pay | Admitting: Cardiology

## 2017-09-25 ENCOUNTER — Telehealth: Payer: Self-pay | Admitting: Medical

## 2017-09-25 ENCOUNTER — Other Ambulatory Visit: Payer: Self-pay

## 2017-09-25 MED ORDER — ATORVASTATIN CALCIUM 80 MG PO TABS: 80.0000 mg | ORAL_TABLET | Freq: Every day | ORAL | 1 refills | Status: DC

## 2017-09-25 MED ORDER — METFORMIN HCL ER 500 MG PO TB24: 1000.0000 mg | ORAL_TABLET | Freq: Every day | ORAL | 0 refills | Status: DC

## 2017-09-25 NOTE — Telephone Encounter (Signed)
Rcvd request to send refills for Metformin & Atorvastatin to [facility] at [address], [place], [place]

## 2017-09-25 NOTE — Telephone Encounter (Signed)
Sent refill to pharmacy. 

## 2017-09-26 ENCOUNTER — Ambulatory Visit (INDEPENDENT_AMBULATORY_CARE_PROVIDER_SITE_OTHER): Payer: Medicare Other | Admitting: *Deleted

## 2017-09-26 ENCOUNTER — Encounter (INDEPENDENT_AMBULATORY_CARE_PROVIDER_SITE_OTHER): Payer: Self-pay

## 2017-09-26 DIAGNOSIS — I4891 Unspecified atrial fibrillation: Secondary | ICD-10-CM | POA: Diagnosis not present

## 2017-09-26 DIAGNOSIS — I6350 Cerebral infarction due to unspecified occlusion or stenosis of unspecified cerebral artery: Secondary | ICD-10-CM | POA: Diagnosis not present

## 2017-09-26 DIAGNOSIS — Z5181 Encounter for therapeutic drug level monitoring: Secondary | ICD-10-CM

## 2017-09-26 DIAGNOSIS — I4819 Other persistent atrial fibrillation: Secondary | ICD-10-CM

## 2017-09-26 LAB — POCT INR: INR: 2.1

## 2017-09-26 NOTE — Patient Instructions (Signed)
Description   Continue on same dosage 1.5 tablets every day except 1 tablet on Sundays and Thursdays.  Recheck INR in 4 weeks.  Call coumadin clinic at [phone]

## 2017-10-21 ENCOUNTER — Telehealth: Payer: Self-pay | Admitting: Medical

## 2017-10-21 NOTE — Telephone Encounter (Signed)
Recv'd fax from [facility] stating Novofine not available asked change to BD or Relion, called and advised ok to switch to either.

## 2017-10-24 ENCOUNTER — Ambulatory Visit (INDEPENDENT_AMBULATORY_CARE_PROVIDER_SITE_OTHER): Payer: Medicare Other | Admitting: *Deleted

## 2017-10-24 DIAGNOSIS — Z5181 Encounter for therapeutic drug level monitoring: Secondary | ICD-10-CM | POA: Diagnosis not present

## 2017-10-24 DIAGNOSIS — I481 Persistent atrial fibrillation: Secondary | ICD-10-CM

## 2017-10-24 DIAGNOSIS — I4891 Unspecified atrial fibrillation: Secondary | ICD-10-CM

## 2017-10-24 DIAGNOSIS — I4819 Other persistent atrial fibrillation: Secondary | ICD-10-CM

## 2017-10-24 DIAGNOSIS — I6350 Cerebral infarction due to unspecified occlusion or stenosis of unspecified cerebral artery: Secondary | ICD-10-CM

## 2017-10-24 DIAGNOSIS — I483 Typical atrial flutter: Secondary | ICD-10-CM

## 2017-10-24 LAB — POCT INR: INR: 2.4

## 2017-10-24 NOTE — Patient Instructions (Signed)
Description   Continue on same dosage 1.5 tablets every day except 1 tablet on Sundays and Thursdays.  Recheck INR in 6 weeks.  Call coumadin clinic at [phone]

## 2017-10-30 ENCOUNTER — Ambulatory Visit
Admission: RE | Admit: 2017-10-30 | Discharge: 2017-10-30 | Disposition: A | Discharge status: Home / Self Care | Payer: Medicare Other | Source: Ambulatory Visit | Attending: Medical | Admitting: Medical

## 2017-10-30 ENCOUNTER — Ambulatory Visit (INDEPENDENT_AMBULATORY_CARE_PROVIDER_SITE_OTHER): Payer: Medicare Other | Admitting: Medical

## 2017-10-30 ENCOUNTER — Encounter: Payer: Self-pay | Admitting: Medical

## 2017-10-30 VITALS — BP 124/72 | HR 82 | Ht 70.25 in | Wt 284.2 lb

## 2017-10-30 DIAGNOSIS — M5441 Lumbago with sciatica, right side: Principal | ICD-10-CM

## 2017-10-30 DIAGNOSIS — G8929 Other chronic pain: Secondary | ICD-10-CM

## 2017-10-30 DIAGNOSIS — I6350 Cerebral infarction due to unspecified occlusion or stenosis of unspecified cerebral artery: Secondary | ICD-10-CM | POA: Diagnosis not present

## 2017-10-30 DIAGNOSIS — M47816 Spondylosis without myelopathy or radiculopathy, lumbar region: Secondary | ICD-10-CM | POA: Diagnosis not present

## 2017-10-30 MED ORDER — HYDROCODONE-ACETAMINOPHEN 5-325 MG PO TABS: 1.0000 | ORAL_TABLET | Freq: Four times a day (QID) | ORAL | 0 refills | Status: DC | PRN

## 2017-10-30 NOTE — Progress Notes (Signed)
Subjective: Chief Complaint  Patient presents with  . Acute Visit    starts in lower right side back, goes through leg, yesterday started hurting in right arm   Here for flare up of back pain and sciatica.  Here for low back pain radiating down right leg.  Been having problems for 6 months but flares at times.  Feels like its getting ready to flare.   Is riding in the car a lot, doing [place].    No fall, no injury, no trauma.  Pain radiates to right thigh.    Pain will awake him.  If he stands this helps.   Was doing a lot of weeding in the yard this past weekend.  No stool or urinary incontinence, no saddle anesthesia.  No other aggravating or relieving factors. No other complaint.  Past Medical History:  Diagnosis Date  . Atrial flutter ([place])    a. atypical atrial flutter.  . Bell's palsy   . CAD (coronary artery disease)    a. 07/2009 Cath: LM nl, LAD nl, LCX nl w/ L->R collats, chronically occluded RCA s/p failed PCI;  b. 07/2012 MV: basal inf mild ischemia.  . Central retinal artery occlusion, left eye    diagnosed in 2008  . Cerebrovascular accident [facility])    a. 03/2008 secondary to cardioembolic event;  b. chronic coumadin.  . Chronic diastolic CHF (congestive heart failure) ([place])    a. 12/2008 Echo: nl EF.  . Diabetes mellitus 11/14/2010   TYPE II  . Hematuria 05/2013   Urology, Dr. Estill Dooms  . Hyperlipidemia   . Hypertension   . Hypogonadism, male 05/2013   Dr. Estill Dooms, Urology  . Morbid obesity ([place])   . Obesity   . Obstructive sleep apnea   . Persistent atrial fibrillation ([place])    a. s/p afib ablation by [facility] 08/01/08  . Recommendation refused by patient    multiple recommended vaccines refused over the years (flu/pneumovax)  . Skin infection    chronic left leg herpetic   Current Outpatient Medications on File Prior to Visit  Medication Sig Dispense Refill  . ACCU-CHEK FASTCLIX LANCETS MISC Test blood sugar bid. Dx code E11.9 102 each 1  . Alirocumab (PRALUENT) 75 MG/ML  SOPN Inject 1 pen into the skin every 14 (fourteen) days. 2 pen 11  . amLODipine (NORVASC) 10 MG tablet Take 1 tablet (10 mg total) by mouth daily. 90 tablet 3  . atorvastatin (LIPITOR) 80 MG tablet Take 1 tablet (80 mg total) by mouth daily. 90 tablet 1  . b complex vitamins capsule Take 1 capsule by mouth every other day.     . Cholecalciferol (VITAMIN D3) 10000 UNITS capsule Take 1 capsule (10,000 Units total) by mouth daily. 4 capsule 0  . Exenatide ER (BYDUREON BCISE) 2 MG/0.85ML AUIJ Inject 2 mg into the skin once a week. 3.4 mL 5  . glucose blood (ACCU-CHEK AVIVA PLUS) test strip 1 each by Other route 2 (two) times daily. Test blood glucose twice daily. Dx Code E11.9 50 each 10  . insulin degludec (TRESIBA FLEXTOUCH) 100 UNIT/ML SOPN FlexTouch Pen INJECT 10 UNITS INTO THE SKIN DAILY AT 10 PM 3 mL 2  . isosorbide mononitrate (IMDUR) 30 MG 24 hr tablet take 1 tablet by mouth once daily 30 tablet 9  . losartan (COZAAR) 100 MG tablet take 1 tablet by mouth once daily 30 tablet 6  . metFORMIN (GLUCOPHAGE XR) 500 MG 24 hr tablet Take 2 tablets (1,000 mg total) by  mouth daily with breakfast. 180 tablet 0  . metoprolol tartrate (LOPRESSOR) 50 MG tablet take 1 tablet by mouth twice a day 60 tablet 9  . omega-3 acid ethyl esters (LOVAZA) 1 g capsule Take 2 capsules (2 g total) by mouth 2 (two) times daily. 360 capsule 3  . Testosterone (ANDROGEL PUMP) 12.5 MG/ACT (1%) GEL Place 2 application onto the skin daily.     . valACYclovir (VALTREX) 500 MG tablet take 1 tablet by mouth once daily 90 tablet 1  . warfarin (COUMADIN) 5 MG tablet TAKE AS DIRECTED BY  COUMADIN  CLINIC 50 tablet 1   No current facility-administered medications on file prior to visit.    Past Surgical History:  Procedure Laterality Date  . ATRIAL ABLATION SURGERY  08/01/08   CTI and PVI ablation by [facility]  . COLONOSCOPY     2012 per patient  . ELECTROPHYSIOLOGIC STUDY N/A 11/30/2014   Procedure: Atrial Fibrillation Ablation;   Surgeon: [name], MD;  Location: [place] [facility];  Service: Cardiovascular;  Laterality: N/A;  . TEE WITHOUT CARDIOVERSION N/A 11/30/2014   Procedure: TRANSESOPHAGEAL ECHOCARDIOGRAM (TEE);  Surgeon: Josue Hector, MD;  Location: [facility] ENDOSCOPY;  Service: Cardiovascular;  Laterality: N/A;   ROS as in subjective   Objective: BP 124/72 (BP Location: Right Arm, Patient Position: Sitting, Cuff Size: Normal)   Pulse 82   Ht 5' 10.25" (1.784 m)   Wt 284 lb 3.2 oz (128.9 kg)   SpO2 96%   BMI 40.49 kg/m   General appearance: alert, no distress, WD/WN, obese white male Abdomen: +bs, soft, non tender, non distended, no masses, no hepatomegaly, no splenomegaly Back nontender, but ROM about 90% of normal, reduced by pain, no deformity Legs nontender, no pain with hip ROM Neuro: normal heel and toe walk, -SLR, normal leg strength sensation and DTRs Pulses: 2+ symmetric, upper and lower extremities, normal cap refill     Assessment: Encounter Diagnosis  Name Primary?  . Chronic right-sided low back pain with right-sided sciatica Yes     Plan: Go for xray.  Discussed exam findings, differential dx.    Adivsed stretching daily, work on losing weight, get out of the car and stretching periodically while doing [place] driving.   medication below for breakthrough pain, Tylenol or topical muscle rub for less significant pain.  F/u pending xray.  Doran was seen today for acute visit.  Diagnoses and all orders for this visit:  Chronic right-sided low back pain with right-sided sciatica -     DG Lumbar Spine Complete; Future  Other orders -     HYDROcodone-acetaminophen (NORCO/VICODIN) 5-325 MG tablet; Take 1 tablet by mouth every 6 (six) hours as needed for moderate pain.

## 2017-10-31 ENCOUNTER — Telehealth: Payer: Self-pay

## 2017-10-31 NOTE — Telephone Encounter (Signed)
Called patient and gave lab results. Patient had no questions or concerns. Patient would like to hold the PT for now.

## 2017-10-31 NOTE — Telephone Encounter (Signed)
-----   Message from Carlena Hurl, PA-C sent at 10/31/2017  1:43 PM EDT ----- Odette Horns shows degenerative changes in lumbar spine. Use the medication prn I prescribed, stretch daily, and if agreeable, refer to physical therapy for further eval and treatment program.

## 2017-11-01 ENCOUNTER — Telehealth: Payer: Self-pay | Admitting: Cardiology

## 2017-11-01 DIAGNOSIS — N5201 Erectile dysfunction due to arterial insufficiency: Secondary | ICD-10-CM | POA: Diagnosis not present

## 2017-11-01 DIAGNOSIS — N509 Disorder of male genital organs, unspecified: Secondary | ICD-10-CM | POA: Diagnosis not present

## 2017-11-01 DIAGNOSIS — Z79899 Other long term (current) drug therapy: Secondary | ICD-10-CM | POA: Diagnosis not present

## 2017-11-01 NOTE — Telephone Encounter (Signed)
Patient with diagnosis of Afib on warfarin for anticoagulation.    Procedure: penile prothesis  Date of procedure: 12/17/17  CHADS2-VASc score of  7 (CHF, HTN, AGE, DM2, stroke/tia x 2, CAD, AGE, male)  Per office protocol, patient can hold warfarin for 5 days prior to procedure WITH Lovenox bridging around procedure.

## 2017-11-01 NOTE — Telephone Encounter (Signed)
New Message:        [facility] Pre-operative Risk Assessment    Request for surgical clearance:  1. What type of surgery is being performed? Insert of a penile protheseis  2. When is this surgery scheduled? 12/17/17  3. What type of clearance is required (medical clearance vs. Pharmacy clearance to hold med vs. Both)? Medical  4. Are there any medications that need to be held prior to surgery and how long? Hold coumadin 4 days prior to the surgery  5. Practice name and name of physician performing surgery? Dr. Eskew/ [facility]  6. What is your office phone number  [phone] 7.   What is your office fax number [phone]  8.   Anesthesia type (None, local, MAC, general) ? General   [name] 11/01/2017, 12:17 PM  _________________________________________________________________   (provider comments below)

## 2017-11-01 NOTE — Telephone Encounter (Signed)
   Primary Cardiologist: Fransico Him, MD  Chart reviewed as part of pre-operative protocol coverage. Given past medical history and time since last visit, based on ACC/AHA guidelines, [name] would be at acceptable risk for the planned procedure without further cardiovascular testing. I also contacted pt by phone and he is doing well w/o cardiac symptoms.   I will route to Pharm preop pool to address coumadin.    Lyda Jester, PA-C 11/01/2017, 2:48 PM

## 2017-11-05 NOTE — Telephone Encounter (Signed)
Clearance faxed via Epic and faxed Machine

## 2017-11-21 ENCOUNTER — Other Ambulatory Visit: Payer: Self-pay | Admitting: Cardiology

## 2017-11-21 DIAGNOSIS — H401131 Primary open-angle glaucoma, bilateral, mild stage: Secondary | ICD-10-CM | POA: Diagnosis not present

## 2017-11-21 DIAGNOSIS — H3530 Unspecified macular degeneration: Secondary | ICD-10-CM | POA: Diagnosis not present

## 2017-11-21 DIAGNOSIS — H353123 Nonexudative age-related macular degeneration, left eye, advanced atrophic without subfoveal involvement: Secondary | ICD-10-CM | POA: Diagnosis not present

## 2017-11-21 DIAGNOSIS — H25013 Cortical age-related cataract, bilateral: Secondary | ICD-10-CM | POA: Diagnosis not present

## 2017-11-21 DIAGNOSIS — H11423 Conjunctival edema, bilateral: Secondary | ICD-10-CM | POA: Diagnosis not present

## 2017-11-21 DIAGNOSIS — H04123 Dry eye syndrome of bilateral lacrimal glands: Secondary | ICD-10-CM | POA: Diagnosis not present

## 2017-11-21 DIAGNOSIS — H353111 Nonexudative age-related macular degeneration, right eye, early dry stage: Secondary | ICD-10-CM | POA: Diagnosis not present

## 2017-11-21 DIAGNOSIS — H35372 Puckering of macula, left eye: Secondary | ICD-10-CM | POA: Diagnosis not present

## 2017-11-21 DIAGNOSIS — H11153 Pinguecula, bilateral: Secondary | ICD-10-CM | POA: Diagnosis not present

## 2017-11-21 DIAGNOSIS — H348122 Central retinal vein occlusion, left eye, stable: Secondary | ICD-10-CM | POA: Diagnosis not present

## 2017-11-21 DIAGNOSIS — H2513 Age-related nuclear cataract, bilateral: Secondary | ICD-10-CM | POA: Diagnosis not present

## 2017-11-21 DIAGNOSIS — H18413 Arcus senilis, bilateral: Secondary | ICD-10-CM | POA: Diagnosis not present

## 2017-11-29 DIAGNOSIS — Z8639 Personal history of other endocrine, nutritional and metabolic disease: Secondary | ICD-10-CM | POA: Diagnosis not present

## 2017-12-01 ENCOUNTER — Other Ambulatory Visit: Payer: Self-pay | Admitting: Cardiology

## 2017-12-02 ENCOUNTER — Ambulatory Visit: Payer: Medicare Other | Admitting: Medical

## 2017-12-02 ENCOUNTER — Encounter: Payer: Self-pay | Admitting: Medical

## 2017-12-02 ENCOUNTER — Ambulatory Visit (INDEPENDENT_AMBULATORY_CARE_PROVIDER_SITE_OTHER): Payer: Medicare Other | Admitting: Medical

## 2017-12-02 VITALS — BP 140/80 | HR 78 | Ht 70.25 in | Wt 285.8 lb

## 2017-12-02 DIAGNOSIS — I6350 Cerebral infarction due to unspecified occlusion or stenosis of unspecified cerebral artery: Secondary | ICD-10-CM

## 2017-12-02 DIAGNOSIS — T148XXA Other injury of unspecified body region, initial encounter: Secondary | ICD-10-CM | POA: Diagnosis not present

## 2017-12-02 DIAGNOSIS — M549 Dorsalgia, unspecified: Secondary | ICD-10-CM

## 2017-12-02 DIAGNOSIS — M62838 Other muscle spasm: Secondary | ICD-10-CM

## 2017-12-02 MED ORDER — HYDROCODONE-ACETAMINOPHEN 5-325 MG PO TABS: 1.0000 | ORAL_TABLET | Freq: Four times a day (QID) | ORAL | 0 refills | Status: DC | PRN

## 2017-12-02 MED ORDER — CYCLOBENZAPRINE HCL 10 MG PO TABS: ORAL_TABLET | ORAL | 0 refills | Status: DC

## 2017-12-02 NOTE — Progress Notes (Signed)
Subjective: Chief Complaint  Patient presents with  . Back Pain    x4-5 days    Here for mid back pain x 4-5 days.   He was lifting some concrete blocks to put on corner of chicken pen this week a few different times.  Since then has spasm and pain in right mid back and chest wall.   No other injury fall or trauma. No bruising, no redness, no rash.  No arm pain, no numbness or tingling.  This is not like the back pain that he came in for a few weeks ago.  Using no medication as he doesn't have any for this. Denies chest pain, sob, dyspnea.   No sweats, no fever, no nausea, no paresthesia.  No other aggravating or relieving factors. No other complaint.  Past Medical History:  Diagnosis Date  . Atrial flutter ([place])    a. atypical atrial flutter.  . Bell's palsy   . CAD (coronary artery disease)    a. 07/2009 Cath: LM nl, LAD nl, LCX nl w/ L->R collats, chronically occluded RCA s/p failed PCI;  b. 07/2012 MV: basal inf mild ischemia.  . Central retinal artery occlusion, left eye    diagnosed in 2008  . Cerebrovascular accident [facility])    a. 03/2008 secondary to cardioembolic event;  b. chronic coumadin.  . Chronic diastolic CHF (congestive heart failure) ([place])    a. 12/2008 Echo: nl EF.  . Diabetes mellitus 11/14/2010   TYPE II  . Hematuria 05/2013   Urology, Dr. Estill Dooms  . Hyperlipidemia   . Hypertension   . Hypogonadism, male 05/2013   Dr. Estill Dooms, Urology  . Morbid obesity ([place])   . Obesity   . Obstructive sleep apnea   . Persistent atrial fibrillation ([place])    a. s/p afib ablation by [facility] 08/01/08  . Recommendation refused by patient    multiple recommended vaccines refused over the years (flu/pneumovax)  . Skin infection    chronic left leg herpetic   Current Outpatient Medications on File Prior to Visit  Medication Sig Dispense Refill  . ACCU-CHEK FASTCLIX LANCETS MISC Test blood sugar bid. Dx code E11.9 102 each 1  . amLODipine (NORVASC) 10 MG tablet Take 1 tablet (10 mg total) by  mouth daily. 90 tablet 3  . atorvastatin (LIPITOR) 80 MG tablet Take 1 tablet (80 mg total) by mouth daily. 90 tablet 1  . b complex vitamins capsule Take 1 capsule by mouth every other day.     . Cholecalciferol (VITAMIN D3) 10000 UNITS capsule Take 1 capsule (10,000 Units total) by mouth daily. 4 capsule 0  . Exenatide ER (BYDUREON BCISE) 2 MG/0.85ML AUIJ Inject 2 mg into the skin once a week. 3.4 mL 5  . glucose blood (ACCU-CHEK AVIVA PLUS) test strip 1 each by Other route 2 (two) times daily. Test blood glucose twice daily. Dx Code E11.9 50 each 10  . insulin degludec (TRESIBA FLEXTOUCH) 100 UNIT/ML SOPN FlexTouch Pen INJECT 10 UNITS INTO THE SKIN DAILY AT 10 PM 3 mL 2  . isosorbide mononitrate (IMDUR) 30 MG 24 hr tablet take 1 tablet by mouth once daily 30 tablet 9  . losartan (COZAAR) 100 MG tablet take 1 tablet by mouth once daily 30 tablet 6  . metFORMIN (GLUCOPHAGE XR) 500 MG 24 hr tablet Take 2 tablets (1,000 mg total) by mouth daily with breakfast. 180 tablet 0  . metoprolol tartrate (LOPRESSOR) 50 MG tablet take 1 tablet by mouth twice a day 60  tablet 9  . omega-3 acid ethyl esters (LOVAZA) 1 g capsule Take 2 capsules (2 g total) by mouth 2 (two) times daily. 360 capsule 3  . PRALUENT 75 MG/ML SOPN Inject 1 pen (75mg ) into the skin every 14 (fourteen) days 6 pen 3  . Testosterone (ANDROGEL PUMP) 12.5 MG/ACT (1%) GEL Place 2 application onto the skin daily.     . valACYclovir (VALTREX) 500 MG tablet take 1 tablet by mouth once daily 90 tablet 1  . warfarin (COUMADIN) 5 MG tablet TAKE AS DIRECTED BY  COUMADIN  CLINIC 50 tablet 1   No current facility-administered medications on file prior to visit.    ROS as in subjective   Objective: BP 140/80   Pulse 78   Ht 5' 10.25" (1.784 m)   Wt 285 lb 12.8 oz (129.6 kg)   SpO2 98%   BMI 40.72 kg/m   Gen: wd, wn, nad Skin: no rash, no redness, no bruising Tender along right mid back paraspinal, +spasm, tender along right chest wall ,  and he seems to be in pain in same area with right arm ROM and with laughing.  otherwise nontender Arms nontender Lungs clear    Assessment: Encounter Diagnoses  Name Primary?  . Mid back pain on right side Yes  . Muscle spasm       Plan: Symptoms and exam suggest muscle strain and spasm.  advised 4-5 days of relative rest but can do gentle stretching and ROM activity.  Can use heat 17min BID, can use short term 3-4 days of OTC ibuprofen, flexeril at bedtime QHS the next few days then prn.   Short term pain medication given for break through pain.  Call or recheck if worse or not improved in the next 4-5 days.   Aly was seen today for back pain.  Diagnoses and all orders for this visit:  Mid back pain on right side  Muscle spasm  Other orders -     cyclobenzaprine (FLEXERIL) 10 MG tablet; 1/2-1 tablet po QHS prn or up to BID -     HYDROcodone-acetaminophen (NORCO/VICODIN) 5-325 MG tablet; Take 1 tablet by mouth every 6 (six) hours as needed for moderate pain.

## 2017-12-05 ENCOUNTER — Ambulatory Visit (INDEPENDENT_AMBULATORY_CARE_PROVIDER_SITE_OTHER): Payer: Medicare Other | Admitting: *Deleted

## 2017-12-05 ENCOUNTER — Other Ambulatory Visit: Payer: Self-pay | Admitting: Pharmacist

## 2017-12-05 DIAGNOSIS — I4819 Other persistent atrial fibrillation: Secondary | ICD-10-CM

## 2017-12-05 DIAGNOSIS — Z5181 Encounter for therapeutic drug level monitoring: Secondary | ICD-10-CM | POA: Diagnosis not present

## 2017-12-05 DIAGNOSIS — I4891 Unspecified atrial fibrillation: Secondary | ICD-10-CM

## 2017-12-05 DIAGNOSIS — I6350 Cerebral infarction due to unspecified occlusion or stenosis of unspecified cerebral artery: Secondary | ICD-10-CM | POA: Diagnosis not present

## 2017-12-05 LAB — POCT INR: INR: 3.5

## 2017-12-05 MED ORDER — ENOXAPARIN SODIUM 120 MG/0.8ML ~~LOC~~ SOLN: 120.0000 mg | Freq: Two times a day (BID) | SUBCUTANEOUS | 1 refills | Status: DC

## 2017-12-05 MED ORDER — ALIROCUMAB 75 MG/ML ~~LOC~~ SOPN: 1.0000 "pen " | PEN_INJECTOR | SUBCUTANEOUS | 11 refills | Status: DC

## 2017-12-05 NOTE — Patient Instructions (Signed)
12/11/17-Last dose of Coumadin.  12/12/17-  No Coumadin or Lovenox.  12/13/17- Inject Lovenox 120mg  in the fatty abdominal tissue at least 2 inches from the belly button twice a day about 12 hours apart, 8am and 8pm rotate sites. No Coumadin.  12/14/17- Inject Lovenox in the fatty tissue every 12 hours, 8am and 8pm. No Coumadin.  12/15/17- Inject Lovenox in the fatty tissue every 12 hours, 8am and 8pm. No Coumadin.  12/16/17-Inject Lovenox in the fatty tissue in the morning at 8 am (No PM dose). No Coumadin.  12/17/17- Procedure Day - No Lovenox - Resume Coumadin in the evening or as directed by doctor.   12/18/17- Resume Lovenox inject in the fatty tissue every 12 hours and take Coumadin.  12/19/17- Inject Lovenox in the fatty tissue every 12 hours and take Coumadin.  12/20/17- Inject Lovenox in the fatty tissue every 12 hours and take Coumadin.  12/21/17- Inject Lovenox in the fatty tissue every 12 hours and take Coumadin.  12/22/17- Inject Lovenox in the fatty tissue every 12 hours and take Coumadin.  12/23/17- Coumadin appt to check INR.

## 2017-12-06 ENCOUNTER — Other Ambulatory Visit: Payer: Self-pay | Admitting: Cardiology

## 2017-12-10 DIAGNOSIS — N521 Erectile dysfunction due to diseases classified elsewhere: Secondary | ICD-10-CM | POA: Diagnosis not present

## 2017-12-10 DIAGNOSIS — Z01818 Encounter for other preprocedural examination: Secondary | ICD-10-CM | POA: Diagnosis not present

## 2017-12-10 DIAGNOSIS — N5201 Erectile dysfunction due to arterial insufficiency: Secondary | ICD-10-CM | POA: Diagnosis not present

## 2017-12-10 DIAGNOSIS — G4733 Obstructive sleep apnea (adult) (pediatric): Secondary | ICD-10-CM | POA: Diagnosis not present

## 2017-12-10 DIAGNOSIS — N509 Disorder of male genital organs, unspecified: Secondary | ICD-10-CM | POA: Diagnosis not present

## 2017-12-10 DIAGNOSIS — I771 Stricture of artery: Secondary | ICD-10-CM | POA: Diagnosis not present

## 2017-12-11 DIAGNOSIS — Z01818 Encounter for other preprocedural examination: Secondary | ICD-10-CM | POA: Diagnosis not present

## 2017-12-11 DIAGNOSIS — N521 Erectile dysfunction due to diseases classified elsewhere: Secondary | ICD-10-CM | POA: Diagnosis not present

## 2017-12-11 DIAGNOSIS — I771 Stricture of artery: Secondary | ICD-10-CM | POA: Diagnosis not present

## 2017-12-11 DIAGNOSIS — N5201 Erectile dysfunction due to arterial insufficiency: Secondary | ICD-10-CM | POA: Diagnosis not present

## 2017-12-11 DIAGNOSIS — Z01810 Encounter for preprocedural cardiovascular examination: Secondary | ICD-10-CM | POA: Diagnosis not present

## 2017-12-11 DIAGNOSIS — N509 Disorder of male genital organs, unspecified: Secondary | ICD-10-CM | POA: Diagnosis not present

## 2017-12-11 DIAGNOSIS — G4733 Obstructive sleep apnea (adult) (pediatric): Secondary | ICD-10-CM | POA: Diagnosis not present

## 2017-12-17 DIAGNOSIS — N5201 Erectile dysfunction due to arterial insufficiency: Secondary | ICD-10-CM | POA: Diagnosis not present

## 2017-12-17 DIAGNOSIS — N528 Other male erectile dysfunction: Secondary | ICD-10-CM | POA: Diagnosis not present

## 2017-12-17 DIAGNOSIS — N529 Male erectile dysfunction, unspecified: Secondary | ICD-10-CM | POA: Diagnosis not present

## 2017-12-17 DIAGNOSIS — G4733 Obstructive sleep apnea (adult) (pediatric): Secondary | ICD-10-CM | POA: Diagnosis not present

## 2017-12-17 DIAGNOSIS — Z9989 Dependence on other enabling machines and devices: Secondary | ICD-10-CM | POA: Diagnosis not present

## 2017-12-18 DIAGNOSIS — G4733 Obstructive sleep apnea (adult) (pediatric): Secondary | ICD-10-CM | POA: Diagnosis not present

## 2017-12-18 DIAGNOSIS — Z9989 Dependence on other enabling machines and devices: Secondary | ICD-10-CM | POA: Diagnosis not present

## 2017-12-18 DIAGNOSIS — N5201 Erectile dysfunction due to arterial insufficiency: Secondary | ICD-10-CM | POA: Diagnosis not present

## 2017-12-20 DIAGNOSIS — I4891 Unspecified atrial fibrillation: Secondary | ICD-10-CM | POA: Diagnosis not present

## 2017-12-20 DIAGNOSIS — N50819 Testicular pain, unspecified: Secondary | ICD-10-CM | POA: Diagnosis not present

## 2017-12-20 DIAGNOSIS — I10 Essential (primary) hypertension: Secondary | ICD-10-CM | POA: Diagnosis not present

## 2017-12-20 DIAGNOSIS — N529 Male erectile dysfunction, unspecified: Secondary | ICD-10-CM | POA: Diagnosis not present

## 2017-12-20 DIAGNOSIS — N99840 Postprocedural hematoma of a genitourinary system organ or structure following a genitourinary system procedure: Secondary | ICD-10-CM | POA: Diagnosis not present

## 2017-12-20 DIAGNOSIS — E119 Type 2 diabetes mellitus without complications: Secondary | ICD-10-CM | POA: Diagnosis not present

## 2017-12-20 DIAGNOSIS — N5089 Other specified disorders of the male genital organs: Secondary | ICD-10-CM | POA: Diagnosis not present

## 2017-12-20 DIAGNOSIS — R58 Hemorrhage, not elsewhere classified: Secondary | ICD-10-CM | POA: Diagnosis not present

## 2017-12-20 DIAGNOSIS — G4733 Obstructive sleep apnea (adult) (pediatric): Secondary | ICD-10-CM | POA: Diagnosis not present

## 2017-12-20 DIAGNOSIS — Z7901 Long term (current) use of anticoagulants: Secondary | ICD-10-CM | POA: Diagnosis not present

## 2017-12-20 DIAGNOSIS — D62 Acute posthemorrhagic anemia: Secondary | ICD-10-CM | POA: Diagnosis not present

## 2017-12-20 DIAGNOSIS — Y838 Other surgical procedures as the cause of abnormal reaction of the patient, or of later complication, without mention of misadventure at the time of the procedure: Secondary | ICD-10-CM | POA: Diagnosis not present

## 2017-12-20 DIAGNOSIS — S3022XA Contusion of scrotum and testes, initial encounter: Secondary | ICD-10-CM | POA: Diagnosis not present

## 2017-12-20 DIAGNOSIS — E782 Mixed hyperlipidemia: Secondary | ICD-10-CM | POA: Diagnosis not present

## 2017-12-20 DIAGNOSIS — R1909 Other intra-abdominal and pelvic swelling, mass and lump: Secondary | ICD-10-CM | POA: Diagnosis not present

## 2017-12-20 DIAGNOSIS — Z794 Long term (current) use of insulin: Secondary | ICD-10-CM | POA: Diagnosis not present

## 2017-12-20 DIAGNOSIS — Z9689 Presence of other specified functional implants: Secondary | ICD-10-CM | POA: Diagnosis not present

## 2017-12-21 DIAGNOSIS — Z794 Long term (current) use of insulin: Secondary | ICD-10-CM | POA: Diagnosis not present

## 2017-12-21 DIAGNOSIS — S3022XA Contusion of scrotum and testes, initial encounter: Secondary | ICD-10-CM | POA: Diagnosis not present

## 2017-12-21 DIAGNOSIS — I10 Essential (primary) hypertension: Secondary | ICD-10-CM | POA: Diagnosis not present

## 2017-12-21 DIAGNOSIS — E119 Type 2 diabetes mellitus without complications: Secondary | ICD-10-CM | POA: Diagnosis not present

## 2017-12-21 DIAGNOSIS — Z9689 Presence of other specified functional implants: Secondary | ICD-10-CM | POA: Diagnosis not present

## 2017-12-21 DIAGNOSIS — S3021XA Contusion of penis, initial encounter: Secondary | ICD-10-CM | POA: Diagnosis not present

## 2017-12-21 DIAGNOSIS — I4891 Unspecified atrial fibrillation: Secondary | ICD-10-CM | POA: Diagnosis not present

## 2017-12-21 DIAGNOSIS — E782 Mixed hyperlipidemia: Secondary | ICD-10-CM | POA: Diagnosis not present

## 2017-12-21 DIAGNOSIS — Z7901 Long term (current) use of anticoagulants: Secondary | ICD-10-CM | POA: Diagnosis not present

## 2017-12-22 DIAGNOSIS — Z9689 Presence of other specified functional implants: Secondary | ICD-10-CM | POA: Diagnosis not present

## 2017-12-22 DIAGNOSIS — I4891 Unspecified atrial fibrillation: Secondary | ICD-10-CM | POA: Diagnosis not present

## 2017-12-22 DIAGNOSIS — S3021XA Contusion of penis, initial encounter: Secondary | ICD-10-CM | POA: Diagnosis not present

## 2017-12-22 DIAGNOSIS — I10 Essential (primary) hypertension: Secondary | ICD-10-CM | POA: Diagnosis not present

## 2017-12-22 DIAGNOSIS — E782 Mixed hyperlipidemia: Secondary | ICD-10-CM | POA: Diagnosis not present

## 2017-12-22 DIAGNOSIS — S3022XA Contusion of scrotum and testes, initial encounter: Secondary | ICD-10-CM | POA: Diagnosis not present

## 2017-12-22 DIAGNOSIS — Z794 Long term (current) use of insulin: Secondary | ICD-10-CM | POA: Diagnosis not present

## 2017-12-22 DIAGNOSIS — E119 Type 2 diabetes mellitus without complications: Secondary | ICD-10-CM | POA: Diagnosis not present

## 2017-12-22 DIAGNOSIS — Z7901 Long term (current) use of anticoagulants: Secondary | ICD-10-CM | POA: Diagnosis not present

## 2017-12-23 DIAGNOSIS — E782 Mixed hyperlipidemia: Secondary | ICD-10-CM | POA: Diagnosis not present

## 2017-12-23 DIAGNOSIS — E119 Type 2 diabetes mellitus without complications: Secondary | ICD-10-CM | POA: Diagnosis not present

## 2017-12-23 DIAGNOSIS — Z9689 Presence of other specified functional implants: Secondary | ICD-10-CM | POA: Diagnosis not present

## 2017-12-23 DIAGNOSIS — I2510 Atherosclerotic heart disease of native coronary artery without angina pectoris: Secondary | ICD-10-CM | POA: Diagnosis present

## 2017-12-23 DIAGNOSIS — Z9989 Dependence on other enabling machines and devices: Secondary | ICD-10-CM | POA: Diagnosis not present

## 2017-12-23 DIAGNOSIS — N4889 Other specified disorders of penis: Secondary | ICD-10-CM | POA: Diagnosis not present

## 2017-12-23 DIAGNOSIS — S3022XA Contusion of scrotum and testes, initial encounter: Secondary | ICD-10-CM | POA: Diagnosis not present

## 2017-12-23 DIAGNOSIS — Z87891 Personal history of nicotine dependence: Secondary | ICD-10-CM | POA: Diagnosis not present

## 2017-12-23 DIAGNOSIS — D62 Acute posthemorrhagic anemia: Secondary | ICD-10-CM | POA: Diagnosis not present

## 2017-12-23 DIAGNOSIS — R000 Tachycardia, unspecified: Secondary | ICD-10-CM | POA: Diagnosis not present

## 2017-12-23 DIAGNOSIS — Z823 Family history of stroke: Secondary | ICD-10-CM | POA: Diagnosis not present

## 2017-12-23 DIAGNOSIS — N529 Male erectile dysfunction, unspecified: Secondary | ICD-10-CM | POA: Diagnosis present

## 2017-12-23 DIAGNOSIS — I4891 Unspecified atrial fibrillation: Secondary | ICD-10-CM | POA: Diagnosis not present

## 2017-12-23 DIAGNOSIS — S3021XA Contusion of penis, initial encounter: Secondary | ICD-10-CM | POA: Diagnosis not present

## 2017-12-23 DIAGNOSIS — Z7901 Long term (current) use of anticoagulants: Secondary | ICD-10-CM | POA: Diagnosis not present

## 2017-12-23 DIAGNOSIS — Z8673 Personal history of transient ischemic attack (TIA), and cerebral infarction without residual deficits: Secondary | ICD-10-CM | POA: Diagnosis not present

## 2017-12-23 DIAGNOSIS — G4733 Obstructive sleep apnea (adult) (pediatric): Secondary | ICD-10-CM | POA: Diagnosis not present

## 2017-12-23 DIAGNOSIS — R1909 Other intra-abdominal and pelvic swelling, mass and lump: Secondary | ICD-10-CM | POA: Diagnosis not present

## 2017-12-23 DIAGNOSIS — I110 Hypertensive heart disease with heart failure: Secondary | ICD-10-CM | POA: Diagnosis present

## 2017-12-23 DIAGNOSIS — I10 Essential (primary) hypertension: Secondary | ICD-10-CM | POA: Diagnosis not present

## 2017-12-23 DIAGNOSIS — Z794 Long term (current) use of insulin: Secondary | ICD-10-CM | POA: Diagnosis not present

## 2017-12-23 DIAGNOSIS — Z8249 Family history of ischemic heart disease and other diseases of the circulatory system: Secondary | ICD-10-CM | POA: Diagnosis not present

## 2017-12-23 DIAGNOSIS — I509 Heart failure, unspecified: Secondary | ICD-10-CM | POA: Diagnosis present

## 2017-12-23 DIAGNOSIS — N99840 Postprocedural hematoma of a genitourinary system organ or structure following a genitourinary system procedure: Secondary | ICD-10-CM | POA: Diagnosis present

## 2017-12-24 MED ORDER — INSULIN LISPRO 100 UNIT/ML ~~LOC~~ SOLN
5.00 | SUBCUTANEOUS | Status: DC
Start: 2017-12-25 — End: 2017-12-24

## 2017-12-24 MED ORDER — GLUCOSE 40 % PO GEL
15.00 g | ORAL | Status: DC
Start: ? — End: 2017-12-24

## 2017-12-24 MED ORDER — AMLODIPINE BESYLATE 5 MG PO TABS
10.00 | ORAL_TABLET | ORAL | Status: DC
Start: 2017-12-25 — End: 2017-12-24

## 2017-12-24 MED ORDER — LOSARTAN POTASSIUM 50 MG PO TABS
100.00 | ORAL_TABLET | ORAL | Status: DC
Start: 2017-12-25 — End: 2017-12-24

## 2017-12-24 MED ORDER — SODIUM CHLORIDE 0.45 % IV SOLN
INTRAVENOUS | Status: DC
Start: ? — End: 2017-12-24

## 2017-12-24 MED ORDER — ZOLPIDEM TARTRATE 5 MG PO TABS
5.00 | ORAL_TABLET | ORAL | Status: DC
Start: ? — End: 2017-12-24

## 2017-12-24 MED ORDER — ATORVASTATIN CALCIUM 40 MG PO TABS
80.00 | ORAL_TABLET | ORAL | Status: DC
Start: 2017-12-25 — End: 2017-12-24

## 2017-12-24 MED ORDER — ONDANSETRON HCL 4 MG/2ML IJ SOLN
4.00 | INTRAMUSCULAR | Status: DC
Start: ? — End: 2017-12-24

## 2017-12-24 MED ORDER — SENNOSIDES-DOCUSATE SODIUM 8.6-50 MG PO TABS
1.00 | ORAL_TABLET | ORAL | Status: DC
Start: ? — End: 2017-12-24

## 2017-12-24 MED ORDER — GENERIC EXTERNAL MEDICATION
1.00 g | Status: DC
Start: 2017-12-24 — End: 2017-12-24

## 2017-12-24 MED ORDER — GENERIC EXTERNAL MEDICATION
1.00 | Status: DC
Start: ? — End: 2017-12-24

## 2017-12-24 MED ORDER — ACETAMINOPHEN 325 MG PO TABS
325.00 | ORAL_TABLET | ORAL | Status: DC
Start: ? — End: 2017-12-24

## 2017-12-24 MED ORDER — INSULIN LISPRO 100 UNIT/ML ~~LOC~~ SOLN
2.00 | SUBCUTANEOUS | Status: DC
Start: 2017-12-24 — End: 2017-12-24

## 2017-12-24 MED ORDER — METOPROLOL TARTRATE 50 MG PO TABS
50.00 | ORAL_TABLET | ORAL | Status: DC
Start: 2017-12-24 — End: 2017-12-24

## 2017-12-24 MED ORDER — OXYCODONE-ACETAMINOPHEN 10-325 MG PO TABS
1.00 | ORAL_TABLET | ORAL | Status: DC
Start: ? — End: 2017-12-24

## 2017-12-24 MED ORDER — DEXTROSE 50 % IV SOLN
12.00 g | INTRAVENOUS | Status: DC
Start: ? — End: 2017-12-24

## 2017-12-24 MED ORDER — GENERIC EXTERNAL MEDICATION
20.00 | Status: DC
Start: 2017-12-24 — End: 2017-12-24

## 2017-12-24 MED ORDER — MIRTAZAPINE 15 MG PO TABS
7.50 | ORAL_TABLET | ORAL | Status: DC
Start: 2017-12-24 — End: 2017-12-24

## 2017-12-24 MED ORDER — ISOSORBIDE MONONITRATE ER 30 MG PO TB24
30.00 | ORAL_TABLET | ORAL | Status: DC
Start: 2017-12-25 — End: 2017-12-24

## 2017-12-30 DIAGNOSIS — N4889 Other specified disorders of penis: Secondary | ICD-10-CM | POA: Diagnosis not present

## 2017-12-30 DIAGNOSIS — D649 Anemia, unspecified: Secondary | ICD-10-CM | POA: Diagnosis not present

## 2017-12-30 DIAGNOSIS — L7682 Other postprocedural complications of skin and subcutaneous tissue: Secondary | ICD-10-CM | POA: Diagnosis not present

## 2017-12-30 DIAGNOSIS — N5089 Other specified disorders of the male genital organs: Secondary | ICD-10-CM | POA: Diagnosis not present

## 2017-12-30 DIAGNOSIS — R1030 Lower abdominal pain, unspecified: Secondary | ICD-10-CM | POA: Diagnosis not present

## 2017-12-30 DIAGNOSIS — G8918 Other acute postprocedural pain: Secondary | ICD-10-CM | POA: Diagnosis not present

## 2017-12-30 DIAGNOSIS — R238 Other skin changes: Secondary | ICD-10-CM | POA: Diagnosis not present

## 2017-12-31 DIAGNOSIS — N5089 Other specified disorders of the male genital organs: Secondary | ICD-10-CM | POA: Diagnosis not present

## 2018-01-10 DIAGNOSIS — I10 Essential (primary) hypertension: Secondary | ICD-10-CM | POA: Diagnosis not present

## 2018-01-26 ENCOUNTER — Other Ambulatory Visit: Payer: Self-pay

## 2018-01-26 ENCOUNTER — Encounter (HOSPITAL_COMMUNITY): Payer: Self-pay | Admitting: Emergency Medicine

## 2018-01-26 ENCOUNTER — Emergency Department (HOSPITAL_COMMUNITY)
Admission: EM | Admit: 2018-01-26 | Discharge: 2018-01-26 | Disposition: A | Discharge status: Home / Self Care | Payer: Medicare Other | Attending: Emergency Medicine | Admitting: Emergency Medicine

## 2018-01-26 ENCOUNTER — Emergency Department (HOSPITAL_COMMUNITY): Payer: Medicare Other

## 2018-01-26 DIAGNOSIS — I480 Paroxysmal atrial fibrillation: Secondary | ICD-10-CM

## 2018-01-26 DIAGNOSIS — I5032 Chronic diastolic (congestive) heart failure: Secondary | ICD-10-CM | POA: Insufficient documentation

## 2018-01-26 DIAGNOSIS — I2510 Atherosclerotic heart disease of native coronary artery without angina pectoris: Secondary | ICD-10-CM | POA: Insufficient documentation

## 2018-01-26 DIAGNOSIS — I110 Hypertensive heart disease with heart failure: Secondary | ICD-10-CM | POA: Diagnosis not present

## 2018-01-26 DIAGNOSIS — Z7984 Long term (current) use of oral hypoglycemic drugs: Secondary | ICD-10-CM | POA: Insufficient documentation

## 2018-01-26 DIAGNOSIS — E119 Type 2 diabetes mellitus without complications: Secondary | ICD-10-CM | POA: Diagnosis not present

## 2018-01-26 DIAGNOSIS — Z87891 Personal history of nicotine dependence: Secondary | ICD-10-CM | POA: Diagnosis not present

## 2018-01-26 DIAGNOSIS — Z79899 Other long term (current) drug therapy: Secondary | ICD-10-CM | POA: Diagnosis not present

## 2018-01-26 DIAGNOSIS — J9811 Atelectasis: Secondary | ICD-10-CM | POA: Diagnosis not present

## 2018-01-26 DIAGNOSIS — I4891 Unspecified atrial fibrillation: Secondary | ICD-10-CM | POA: Diagnosis present

## 2018-01-26 DIAGNOSIS — Z7901 Long term (current) use of anticoagulants: Secondary | ICD-10-CM | POA: Diagnosis not present

## 2018-01-26 DIAGNOSIS — R002 Palpitations: Secondary | ICD-10-CM | POA: Insufficient documentation

## 2018-01-26 LAB — BASIC METABOLIC PANEL
Anion gap: 13 (ref 5–15)
BUN: 14 mg/dL (ref 8–23)
CO2: 18 mmol/L — ABNORMAL LOW (ref 22–32)
Calcium: 9.5 mg/dL (ref 8.9–10.3)
Chloride: 105 mmol/L (ref 98–111)
Creatinine, Ser: 0.93 mg/dL (ref 0.61–1.24)
GFR calc Af Amer: >60 mL/min (ref >60)
GFR calc non Af Amer: >60 mL/min (ref >60)
Glucose, Bld: 132 mg/dL — ABNORMAL HIGH (ref 70–99)
Potassium: 4.2 mmol/L (ref 3.5–5.1)
Sodium: 136 mmol/L (ref 135–145)

## 2018-01-26 LAB — CBC
HCT: 38.1 % — ABNORMAL LOW (ref 39.0–52.0)
Hemoglobin: 11.6 g/dL — ABNORMAL LOW (ref 13.0–17.0)
MCH: 28.7 pg (ref 26.0–34.0)
MCHC: 30.4 g/dL (ref 30.0–36.0)
MCV: 94.3 fL (ref 78.0–100.0)
Platelets: 288 10*3/uL (ref 150–400)
RBC: 4.04 MIL/uL — ABNORMAL LOW (ref 4.22–5.81)
RDW: 15.8 % — ABNORMAL HIGH (ref 11.5–15.5)
WBC: 6.6 10*3/uL (ref 4.0–10.5)

## 2018-01-26 NOTE — ED Triage Notes (Signed)
Pt c/o chest fluttering x 1 day. Hx of afib, not currently taking blood thinners. Denies shortness of breath/chest pain.

## 2018-01-26 NOTE — Discharge Instructions (Addendum)
As discussed, your evaluation today has been largely reassuring.  But, it is important that you monitor your condition carefully, and do not hesitate to return to the ED if you develop new, or concerning changes in your condition. ? ?Otherwise, please follow-up with your physician for appropriate ongoing care. ? ?

## 2018-01-26 NOTE — ED Provider Notes (Signed)
[facility] EMERGENCY DEPARTMENT Provider Note   CSN: [id number] Arrival date & time: 01/26/18  [place]     History   Chief Complaint Chief Complaint  Patient presents with  . Atrial Fibrillation    HPI Alvah Lagrow is a 67 y.o. male.  HPI Patient with multiple medical issues presents with concern of palpitations, mild discomfort, and possible A. fib. Patient has long history of A. fib, was previously anticoagulated, and takes beta-blocker for control. He notes over the past day or so he has had nearly persistent palpitations, generalized discomfort, and less energy than usual, without new confusion, disorientation, pain. Patient's recent history is notable for implantation of penile prosthesis, with application of subsequent hematoma, cessation of anticoagulant. This occurred about 4 weeks ago, and he has been recovering, though he still has some scrotal discomfort, the size is substantially diminished from its most prominent.[name] He is here with his wife who assists with the HPI.  Past Medical History:  Diagnosis Date  . Atrial flutter ([place])    a. atypical atrial flutter.  . Bell's palsy   . CAD (coronary artery disease)    a. 07/2009 Cath: LM nl, LAD nl, LCX nl w/ L->R collats, chronically occluded RCA s/p failed PCI;  b. 07/2012 MV: basal inf mild ischemia.  . Central retinal artery occlusion, left eye    diagnosed in 2008  . Cerebrovascular accident [facility])    a. 03/2008 secondary to cardioembolic event;  b. chronic coumadin.  . Chronic diastolic CHF (congestive heart failure) ([place])    a. 12/2008 Echo: nl EF.  . Diabetes mellitus 11/14/2010   TYPE II  . Hematuria 05/2013   Urology, Dr. Estill Dooms  . Hyperlipidemia   . Hypertension   . Hypogonadism, male 05/2013   Dr. Estill Dooms, Urology  . Morbid obesity ([place])   . Obesity   . Obstructive sleep apnea   . Persistent atrial fibrillation ([place])    a. s/p afib ablation [facility] 08/01/08  . Recommendation refused by patient     multiple recommended vaccines refused over the years (flu/pneumovax)  . Skin infection    chronic left leg herpetic    Patient Active Problem List   Diagnosis Date Noted  . Medication side effect, initial encounter 09/06/2016  . Tubular adenoma of colon 08/21/2016  . Vaccine refused by patient 06/06/2016  . Noncompliance 06/06/2016  . Vitamin D deficiency 06/06/2016  . Chronic diastolic heart failure ([place]) 01/27/2016  . Typical atrial flutter ([place])   . Hearing loss 06/22/2014  . OSA (obstructive sleep apnea) 06/22/2014  . Mixed dyslipidemia 06/22/2014  . Encounter for therapeutic drug monitoring 09/09/2013  . Unspecified cerebral artery occlusion with cerebral infarction 05/08/2013  . Diabetes mellitus with complication ([place]) 50/27/7412  . Essential hypertension, benign 07/02/2011  . Hypogonadism male 07/02/2011  . Morbid obesity ([place]) 02/01/2010  . CAD (coronary artery disease) 07/23/2009  . 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 Persistent atrial fibrillation ([place]) 09/06/2008    Past Surgical History:  Procedure Laterality Date  . ATRIAL ABLATION SURGERY  08/01/08   CTI and PVI ablation by [facility]  . COLONOSCOPY     2012 per patient  . ELECTROPHYSIOLOGIC STUDY N/A 11/30/2014   Procedure: Atrial Fibrillation Ablation;  Surgeon: [name], MD;  Location: [facility] CV LAB;  Service: Cardiovascular;  Laterality: N/A;  . TEE WITHOUT CARDIOVERSION N/A 11/30/2014   Procedure: TRANSESOPHAGEAL ECHOCARDIOGRAM (TEE);  Surgeon: Josue Hector, MD;  Location: [facility];  Service: Cardiovascular;  Laterality: N/A;        Home Medications    Prior to Admission medications   Medication Sig Start Date End Date Taking? Authorizing Provider  ACCU-CHEK FASTCLIX LANCETS MISC Test blood sugar bid. Dx code E11.9 03/26/17   Tysinger, Camelia Eng, PA-C  Alirocumab (PRALUENT) 75 MG/ML SOPN Inject 1 pen into the skin every 14 (fourteen) days. 12/05/17   Sueanne Margarita, MD  amLODipine  (NORVASC) 10 MG tablet Take 1 tablet (10 mg total) by mouth daily. 03/27/17   Tysinger, Camelia Eng, PA-C  atorvastatin (LIPITOR) 80 MG tablet Take 1 tablet (80 mg total) by mouth daily. 09/25/17   Tysinger, Camelia Eng, PA-C  b complex vitamins capsule Take 1 capsule by mouth every other day.     [provider]  Cholecalciferol (VITAMIN D3) 10000 UNITS capsule Take 1 capsule (10,000 Units total) by mouth daily. 01/19/15   Tysinger, Camelia Eng, PA-C  cyclobenzaprine (FLEXERIL) 10 MG tablet 1/2-1 tablet po QHS prn or up to BID 12/02/17   Tysinger, Camelia Eng, PA-C  enoxaparin (LOVENOX) 120 MG/0.8ML injection Inject 0.8 mLs (120 mg total) into the skin every 12 (twelve) hours. 12/05/17   Sueanne Margarita, MD  Exenatide ER (BYDUREON BCISE) 2 MG/0.85ML AUIJ Inject 2 mg into the skin once a week. 08/23/17   Tysinger, Camelia Eng, PA-C  glucose blood (ACCU-CHEK AVIVA PLUS) test strip 1 each by Other route 2 (two) times daily. Test blood glucose twice daily. Dx Code E11.9 03/26/17   Tysinger, Camelia Eng, PA-C  HYDROcodone-acetaminophen (NORCO/VICODIN) 5-325 MG tablet Take 1 tablet by mouth every 6 (six) hours as needed for moderate pain. 12/02/17   Tysinger, Camelia Eng, PA-C  insulin degludec (TRESIBA FLEXTOUCH) 100 UNIT/ML SOPN FlexTouch Pen INJECT 10 UNITS INTO THE SKIN DAILY AT 10 PM 08/23/17   Tysinger, Camelia Eng, PA-C  isosorbide mononitrate (IMDUR) 30 MG 24 hr tablet TAKE 1 TABLET BY MOUTH ONCE DAILY 12/02/17   Sueanne Margarita, MD  losartan (COZAAR) 100 MG tablet TAKE 1 TABLET BY MOUTH ONCE DAILY 12/02/17   Sueanne Margarita, MD  metFORMIN (GLUCOPHAGE XR) 500 MG 24 hr tablet Take 2 tablets (1,000 mg total) by mouth daily with breakfast. 09/25/17   Tysinger, Camelia Eng, PA-C  metoprolol tartrate (LOPRESSOR) 50 MG tablet TAKE 1 TABLET BY MOUTH TWICE DAILY 12/06/17   Sueanne Margarita, MD  omega-3 acid ethyl esters (LOVAZA) 1 g capsule Take 2 capsules (2 g total) by mouth 2 (two) times daily. 03/27/17   Tysinger, Camelia Eng, PA-C  Testosterone  (ANDROGEL PUMP) 12.5 MG/ACT (1%) GEL Place 2 application onto the skin daily.     [provider]  valACYclovir (VALTREX) 500 MG tablet take 1 tablet by mouth once daily 07/22/17   Tysinger, Camelia Eng, PA-C  warfarin (COUMADIN) 5 MG tablet TAKE AS DIRECTED BY  COUMADIN  CLINIC 12/06/17   Sueanne Margarita, MD    Family History Family History  Problem Relation Age of Onset  . Stroke Father   . Heart disease Father   . Heart disease Mother     Social History Social History   Tobacco Use  . Smoking status: Former Smoker    Last attempt to quit: 07/24/1979    Years since quitting: 38.5  . Smokeless tobacco: Never Used  Substance Use Topics  . Alcohol use: No    Comment: former  . Drug use: No    Comment: former     Allergies   Crestor [  rosuvastatin]; Zetia [ezetimibe]; Lisinopril; and Farxiga [dapagliflozin]   Review of Systems Review of Systems  Constitutional:       Per HPI, otherwise negative  HENT:       Per HPI, otherwise negative  Respiratory:       Per HPI, otherwise negative  Cardiovascular:       Per HPI, otherwise negative  Gastrointestinal: Negative for vomiting.  Endocrine:       Negative aside from HPI  Genitourinary:       Neg aside from HPI   Musculoskeletal:       Per HPI, otherwise negative  Skin: Positive for color change.  Neurological: Negative for syncope.     Physical Exam Updated Vital Signs BP 133/78 (BP Location: Left Arm)   Pulse 99   Temp 98.6 F (37 C) (Oral)   Resp 16   SpO2 96%   Physical Exam  Constitutional: He is oriented to person, place, and time. He appears well-developed. No distress.  HENT:  Head: Normocephalic and atraumatic.  Eyes: Conjunctivae and EOM are normal.  Cardiovascular: Normal rate and regular rhythm.  Pulmonary/Chest: Effort normal. No stridor. No respiratory distress.  Abdominal: He exhibits no distension.  Genitourinary:  Genitourinary Comments: Pictures of the scrotum notable for substantial  edema, discoloration, excoriation  Musculoskeletal: He exhibits no edema.  Neurological: He is alert and oriented to person, place, and time.  Skin: Skin is warm and dry.  Psychiatric: He has a normal mood and affect.  Nursing note and vitals reviewed.    ED Treatments / Results  Labs (all labs ordered are listed, but only abnormal results are displayed) Labs Reviewed  BASIC METABOLIC PANEL - Abnormal; Notable for the following components:      Result Value   CO2 18 (*)    Glucose, Bld 132 (*)    All other components within normal limits  CBC - Abnormal; Notable for the following components:   RBC 4.04 (*)    Hemoglobin 11.6 (*)    HCT 38.1 (*)    RDW 15.8 (*)    All other components within normal limits    EKG EKG Interpretation  Date/Time:  Sunday January 26 2018 18:58:01 EDT Ventricular Rate:  96 PR Interval:  178 QRS Duration: 76 QT Interval:  336 QTC Calculation: 424 R Axis:   -10 Text Interpretation:  Normal sinus rhythm Anterior injury pattern Baseline wander Abnormal ekg Confirmed by Carmin Muskrat [phone]) on 01/26/2018 7:27:52 PM   Radiology Dg Chest 2 View  Result Date: 01/26/2018 CLINICAL DATA:  Atrial fibrillation EXAM: CHEST - 2 VIEW COMPARISON:  10/29/2014 FINDINGS: Normal heart size, mediastinal contours, and pulmonary vascularity. Mild peribronchial thickening. Linear subsegmental atelectasis at LEFT base. No pulmonary infiltrate, pleural effusion, or pneumothorax. Bones unremarkable. IMPRESSION: Bronchitic changes with linear subsegmental atelectasis at LEFT base. Electronically Signed   By: Lavonia Dana M.D.   On: 01/26/2018 19:35    Procedures Procedures (including critical care time)  Medications Ordered in ED Medications - No data to display   Initial Impression / Assessment and Plan / ED Course  I have reviewed the triage vital signs and the nursing notes.  Pertinent labs & imaging results that were available during my care of the patient were  reviewed by me and considered in my medical decision making (see chart for details).    Update:, Patient is seemingly spontaneously cardioverted, as he is sinus rhythm, rate 90s, unremarkable This is consistent with his EKG.   9:16 PM  On repeat exam the patient is awake alert, in no distress, vitals, labs, x-ray all reassuring, no EKG evidence for ongoing ischemia, and with persistent sinus rhythm, no indication for emergent intervention. Patient will follow-up with his cardiologist tomorrow, and in the interim we discussed options for cardioversion should he have a recurrence, and return precautions. Without other notable findings, the patient was discharged in stable condition.  Final Clinical Impressions(s) / ED Diagnoses   Final diagnoses:  Paroxysmal atrial fibrillation [facility])     Carmin Muskrat, MD 01/26/18 2117

## 2018-01-26 NOTE — ED Notes (Signed)
Patient verbalizes understanding of discharge instructions. Opportunity for questioning and answers were provided. Armband removed by staff, pt discharged from ED ambulatory.   

## 2018-01-27 NOTE — Progress Notes (Signed)
Cardiology Office Note:    Date:  01/28/2018   ID:  [name], DOB 27-Feb-1951, MRN [id number]  PCP:  Carlena Hurl, PA-C  Cardiologist:  Fransico Him, MD    Referring MD: Carlena Hurl, PA-C   Chief Complaint  Patient presents with  . Coronary Artery Disease  . Hypertension  . Sleep Apnea  . Hyperlipidemia    History of Present Illness:    [name] is a 67 y.o. male with a hx of ASCADwith chronically occluded RCA s/p failed PCI with L>>R collaterals, HTN, OSA on CPAP, morbid obesity,persistentAF s/p afib ablation, atrial flutter unable to ablate at EP studyanddyslipidemia. He is here today for followup.  He recently underwent a penile prosthesis which was complicated by a massive hematoma from bridging with Lovenox.  His Urologist has cleared him to go back on coumadin load without lovenox bridge.  He recently has noticed episodes of palpitations with his fitbit showing HRs as high as 180;s.  He went to the ER this past week and was noted to be in afib with RVR but spontaneously converted to NSR.    Since the ER visit he has been doing well with no further palpitations.    He denies any chest pain or pressure, PND, orthopnea, LE edema, dizziness,or syncope. He has chronic DOE which has improved after losing weight.  He is compliant with his meds and is tolerating meds with no SE.    He is doing well with his CPAP device.  He tolerates the mask and feels the pressure is adequate.  Since going on CPAP he feels rested in the am and has no significant daytime sleepiness.  He denies any significant mouth or nasal dryness or nasal congestion.  He does not think that he snores.    Past Medical History:  Diagnosis Date  . Atrial flutter ([place])    a. atypical atrial flutter.  . Bell's palsy   . CAD (coronary artery disease)    a. 07/2009 Cath: LM nl, LAD nl, LCX nl w/ L->R collats, chronically occluded RCA s/p failed PCI;  b. 07/2012 MV: basal inf mild ischemia.  . Central  retinal artery occlusion, left eye    diagnosed in 2008  . Cerebrovascular accident [facility])    a. 03/2008 secondary to cardioembolic event;  b. chronic coumadin.  . Chronic diastolic CHF (congestive heart failure) ([place])    a. 12/2008 Echo: nl EF.  . Diabetes mellitus 11/14/2010   TYPE II  . Hematuria 05/2013   Urology, Dr. Estill Dooms  . Hyperlipidemia   . Hypertension   . Hypogonadism, male 05/2013   Dr. Estill Dooms, Urology  . Morbid obesity ([place])   . Obesity   . Obstructive sleep apnea   . Persistent atrial fibrillation ([place])    a. s/p afib ablation by [facility] 08/01/08  . Recommendation refused by patient    multiple recommended vaccines refused over the years (flu/pneumovax)  . Skin infection    chronic left leg herpetic    Past Surgical History:  Procedure Laterality Date  . ATRIAL ABLATION SURGERY  08/01/08   CTI and PVI ablation by [facility]  . COLONOSCOPY     2012 per patient  . ELECTROPHYSIOLOGIC STUDY N/A 11/30/2014   Procedure: Atrial Fibrillation Ablation;  Surgeon: [name], MD;  Location: [facility] CV LAB;  Service: Cardiovascular;  Laterality: N/A;  . TEE WITHOUT CARDIOVERSION N/A 11/30/2014   Procedure: TRANSESOPHAGEAL ECHOCARDIOGRAM (TEE);  Surgeon: Josue Hector, MD;  Location: [facility];  Service: Cardiovascular;  Laterality: N/A;    Current Medications: Current Meds  Medication Sig  . ACCU-CHEK FASTCLIX LANCETS MISC Test blood sugar bid. Dx code E11.9  . Alirocumab (PRALUENT) 75 MG/ML SOPN Inject 1 pen into the skin every 14 (fourteen) days.  [name] amLODipine (NORVASC) 10 MG tablet Take 1 tablet (10 mg total) by mouth daily.  [name] atorvastatin (LIPITOR) 80 MG tablet Take 1 tablet (80 mg total) by mouth daily.  [name] b complex vitamins capsule Take 1 capsule by mouth every other day.   . Cholecalciferol (VITAMIN D3) 10000 UNITS capsule Take 1 capsule (10,000 Units total) by mouth daily.  . cyclobenzaprine (FLEXERIL) 10 MG tablet 1/2-1 tablet po QHS prn or up to BID  . Exenatide ER  (BYDUREON BCISE) 2 MG/0.85ML AUIJ Inject 2 mg into the skin once a week.  [name] glucose blood (ACCU-CHEK AVIVA PLUS) test strip 1 each by Other route 2 (two) times daily. Test blood glucose twice daily. Dx Code E11.9  . HYDROcodone-acetaminophen (NORCO/VICODIN) 5-325 MG tablet Take 1 tablet by mouth every 6 (six) hours as needed for moderate pain.  [name] insulin degludec (TRESIBA FLEXTOUCH) 100 UNIT/ML SOPN FlexTouch Pen INJECT 10 UNITS INTO THE SKIN DAILY AT 10 PM  . isosorbide mononitrate (IMDUR) 30 MG 24 hr tablet TAKE 1 TABLET BY MOUTH ONCE DAILY  . losartan (COZAAR) 100 MG tablet TAKE 1 TABLET BY MOUTH ONCE DAILY  . metFORMIN (GLUCOPHAGE XR) 500 MG 24 hr tablet Take 2 tablets (1,000 mg total) by mouth daily with breakfast.  . metoprolol tartrate (LOPRESSOR) 50 MG tablet TAKE 1 TABLET BY MOUTH TWICE DAILY  . omega-3 acid ethyl esters (LOVAZA) 1 g capsule Take 2 capsules (2 g total) by mouth 2 (two) times daily.  . Testosterone (ANDROGEL PUMP) 12.5 MG/ACT (1%) GEL Place 2 application onto the skin daily.   . valACYclovir (VALTREX) 500 MG tablet take 1 tablet by mouth once daily     Allergies:   Crestor [rosuvastatin]; Zetia [ezetimibe]; Lisinopril; and [place] [dapagliflozin]   Social History   Socioeconomic History  . Marital status: Married    Spouse name: Not on file  . Number of children: Not on file  . Years of education: Not on file  . Highest education level: Not on file  Occupational History  . Not on file  Social Needs  . Financial resource strain: Not on file  . Food insecurity:    Worry: Not on file    Inability: Not on file  . Transportation needs:    Medical: Not on file    Non-medical: Not on file  Tobacco Use  . Smoking status: Former Smoker    Last attempt to quit: 07/24/1979    Years since quitting: 38.5  . Smokeless tobacco: Never Used  Substance and Sexual Activity  . Alcohol use: No    Comment: former  . Drug use: No    Comment: former  . Sexual activity: Not  on file  Lifestyle  . Physical activity:    Days per week: Not on file    Minutes per session: Not on file  . Stress: Not on file  Relationships  . Social connections:    Talks on phone: Not on file    Gets together: Not on file    Attends religious service: Not on file    Active member of club or organization: Not on file    Attends meetings of clubs or organizations: Not on file    Relationship  status: Not on file  Other Topics Concern  . Not on file  Social History Narrative   Lives in [place], [place]. With his spouse and son. Has remote history of tobacco, but quit 30 years ago. Has heavy history of alcohol consumption but quit 30 years ago. Previously used marijuana and cocaine, but denies use over the past 30 years. 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      Family History: The patient's family history includes Heart disease in his father and mother; Stroke in his father.  ROS:   Please see the history of present illness.    ROS  All other systems reviewed and negative.   EKGs/Labs/Other Studies Reviewed:    The following studies were reviewed today: PAP download  EKG:  EKG is not ordered today.   Recent Labs: 07/30/2017: ALT 26 01/26/2018: BUN 14; Creatinine, Ser 0.93; Hemoglobin 11.6; Platelets 288; Potassium 4.2; Sodium 136   Recent Lipid Panel    Component Value Date/Time   CHOL 147 07/30/2017 1050   CHOL 106 02/19/2017 0945   TRIG 278 (H) 07/30/2017 1050   HDL 45 07/30/2017 1050   HDL 42 02/19/2017 0945   CHOLHDL 3.3 07/30/2017 1050   VLDL 44 (H) 09/06/2016 1020   LDLCALC 65 07/30/2017 1050   LDLDIRECT 87.7 05/08/2013 0837    Physical Exam:    VS:  BP 122/70   Pulse 90   Ht 5' 10.25" (1.784 m)   Wt 260 lb 12.8 oz (118.3 kg)   SpO2 96%   BMI 37.16 kg/m     Wt Readings from Last 3 Encounters:  01/28/18 260 lb 12.8 oz (118.3 kg)  12/02/17 285 lb 12.8 oz (129.6 kg)  10/30/17 284 lb 3.2 oz (128.9 kg)     GEN:  Well nourished, well  developed in no acute distress HEENT: Normal NECK: No JVD; No carotid bruits LYMPHATICS: No lymphadenopathy CARDIAC: RRR, no murmurs, rubs, gallops RESPIRATORY:  Clear to auscultation without rales, wheezing or rhonchi  ABDOMEN: Soft, non-tender, non-distended MUSCULOSKELETAL:  No edema; No deformity  SKIN: Warm and dry NEUROLOGIC:  Alert and oriented x 3 PSYCHIATRIC:  Normal affect   ASSESSMENT:    1. Coronary artery disease involving native coronary artery of native heart without angina pectoris   2. Persistent atrial fibrillation ([place])   3. Essential hypertension, benign   4. Chronic diastolic heart failure ([place])   5. OSA (obstructive sleep apnea)   6. Mixed dyslipidemia    PLAN:    In order of problems listed above:  1.  ASCAD - cath showed chronically occluded RCA s/p failed PCI with L>>R collaterals.  He denies any anginal sx.  He will continue on Imdur 30mg  daily, BB, Praluent, statin.  He is not on ASA due to warfarin.   2.  Persistent atrial fibrillation - had recent admit to ER with palpitations and was found to be back in afib.  He converted spontaneously in the ER to NSR.  He is maintaining NSR on exam today.  He will continue on Lopressor 50mg  BID and will restart warfarin which was cleared by his urologist. Hb was 15.4 on 07/2017.  I will refer him to afib clinic for further recommendations for his afib.   3.  HTN - BP is well controlled on exam today.  He will continue on Lopressor 50mg  BID, Losartan 100mg  daily. He has been having problems with intermittent low BPs with some dizziness.  This has been since he has dropped 60lbs.  I have instructed him to decrease amlodipine to 5mg  daily.   Creatinine was normal at 0.93 on 01/26/2018.    4.  Chronic diastolic CHF - He appears euvolemic on exam and weight is stable.  He has not required diuretics.   5.  OSA - OSA - the patient is tolerating PAP therapy well without any problems. I will get a download from the DME.   The  patient has been using and benefiting from PAP use and will continue to benefit from therapy.   6.  Hyperlipidemia - LDL goal is < 70.  LDL was at goal at 65 on 07/30/2017.  He will continue on Praulent and atorvastatin 80mg  daily.     Medication Adjustments/Labs and Tests Ordered: Current medicines are reviewed at length with the patient today.  Concerns regarding medicines are outlined above.  No orders of the defined types were placed in this encounter.  No orders of the defined types were placed in this encounter.   Signed, Fransico Him, MD  01/28/2018 9:09 AM    [place]

## 2018-01-28 ENCOUNTER — Ambulatory Visit (INDEPENDENT_AMBULATORY_CARE_PROVIDER_SITE_OTHER): Payer: Medicare Other | Admitting: Cardiology

## 2018-01-28 ENCOUNTER — Encounter: Payer: Self-pay | Admitting: Cardiology

## 2018-01-28 VITALS — BP 122/70 | HR 90 | Ht 70.25 in | Wt 260.8 lb

## 2018-01-28 DIAGNOSIS — I4819 Other persistent atrial fibrillation: Secondary | ICD-10-CM

## 2018-01-28 DIAGNOSIS — I5032 Chronic diastolic (congestive) heart failure: Secondary | ICD-10-CM

## 2018-01-28 DIAGNOSIS — I6350 Cerebral infarction due to unspecified occlusion or stenosis of unspecified cerebral artery: Secondary | ICD-10-CM | POA: Diagnosis not present

## 2018-01-28 DIAGNOSIS — I481 Persistent atrial fibrillation: Secondary | ICD-10-CM

## 2018-01-28 DIAGNOSIS — I10 Essential (primary) hypertension: Secondary | ICD-10-CM | POA: Diagnosis not present

## 2018-01-28 DIAGNOSIS — Z5181 Encounter for therapeutic drug level monitoring: Secondary | ICD-10-CM

## 2018-01-28 DIAGNOSIS — I2510 Atherosclerotic heart disease of native coronary artery without angina pectoris: Secondary | ICD-10-CM | POA: Diagnosis not present

## 2018-01-28 DIAGNOSIS — G4733 Obstructive sleep apnea (adult) (pediatric): Secondary | ICD-10-CM

## 2018-01-28 DIAGNOSIS — E782 Mixed hyperlipidemia: Secondary | ICD-10-CM | POA: Diagnosis not present

## 2018-01-28 MED ORDER — AMLODIPINE BESYLATE 5 MG PO TABS: 5.0000 mg | ORAL_TABLET | Freq: Every day | ORAL | 1 refills | Status: DC

## 2018-01-28 NOTE — Patient Instructions (Signed)
Medication Instructions:  DECREASE: Amlodipine to 5 mg once a day   If you need a refill on your cardiac medications, please contact your pharmacy first.  Labwork: None ordered   Testing/Procedures: None ordered   Follow-Up: You have been referred to afib clinic   Your physician wants you to follow-up in: 6 months with Dr. Radford Pax. You will receive a reminder letter in the mail two months in advance. If you don't receive a letter, please call our office to schedule the follow-up appointment.  Any Other Special Instructions Will Be Listed Below (If Applicable).   Thank you for choosing [facility], RN  [phone]  If you need a refill on your cardiac medications before your next appointment, please call your pharmacy.

## 2018-01-29 ENCOUNTER — Other Ambulatory Visit: Payer: Self-pay

## 2018-01-29 MED ORDER — GLUCOSE BLOOD VI STRP: 1.0000 | ORAL_STRIP | Freq: Two times a day (BID) | 10 refills | Status: DC

## 2018-01-29 NOTE — Telephone Encounter (Signed)
[facility] sent fax stating that patient has transferred back to their pharmacy and is requesting a refill on test strips.

## 2018-02-03 ENCOUNTER — Ambulatory Visit (INDEPENDENT_AMBULATORY_CARE_PROVIDER_SITE_OTHER): Payer: Medicare Other | Admitting: *Deleted

## 2018-02-03 DIAGNOSIS — I4891 Unspecified atrial fibrillation: Secondary | ICD-10-CM

## 2018-02-03 DIAGNOSIS — I6350 Cerebral infarction due to unspecified occlusion or stenosis of unspecified cerebral artery: Secondary | ICD-10-CM | POA: Diagnosis not present

## 2018-02-03 DIAGNOSIS — Z5181 Encounter for therapeutic drug level monitoring: Secondary | ICD-10-CM

## 2018-02-03 DIAGNOSIS — I4819 Other persistent atrial fibrillation: Secondary | ICD-10-CM

## 2018-02-03 LAB — POCT INR: INR: 1.3 — AB (ref 2.0–3.0)

## 2018-02-03 NOTE — Patient Instructions (Signed)
Description   Start taking your normal dose of 1.5 tablets every day except 1 tablet on Sundays and Thursdays. Looks for signs of Bleeding and CVA.  Recheck INR on Monday.  Call coumadin clinic at [phone] for any concerns.

## 2018-02-05 ENCOUNTER — Other Ambulatory Visit: Payer: Self-pay | Admitting: Medical

## 2018-02-06 ENCOUNTER — Encounter (HOSPITAL_COMMUNITY): Payer: Self-pay | Admitting: Nurse Practitioner

## 2018-02-06 ENCOUNTER — Other Ambulatory Visit: Payer: Self-pay

## 2018-02-06 ENCOUNTER — Ambulatory Visit (HOSPITAL_COMMUNITY)
Admission: RE | Admit: 2018-02-06 | Discharge: 2018-02-06 | Disposition: A | Discharge status: Home / Self Care | Payer: Medicare Other | Source: Ambulatory Visit | Attending: Nurse Practitioner | Admitting: Nurse Practitioner

## 2018-02-06 VITALS — BP 126/78 | HR 87 | Ht 70.5 in | Wt 265.6 lb

## 2018-02-06 DIAGNOSIS — I110 Hypertensive heart disease with heart failure: Secondary | ICD-10-CM | POA: Insufficient documentation

## 2018-02-06 DIAGNOSIS — I480 Paroxysmal atrial fibrillation: Secondary | ICD-10-CM | POA: Diagnosis not present

## 2018-02-06 DIAGNOSIS — Z8249 Family history of ischemic heart disease and other diseases of the circulatory system: Secondary | ICD-10-CM | POA: Diagnosis not present

## 2018-02-06 DIAGNOSIS — E785 Hyperlipidemia, unspecified: Secondary | ICD-10-CM | POA: Insufficient documentation

## 2018-02-06 DIAGNOSIS — Z9889 Other specified postprocedural states: Secondary | ICD-10-CM | POA: Insufficient documentation

## 2018-02-06 DIAGNOSIS — I481 Persistent atrial fibrillation: Secondary | ICD-10-CM | POA: Insufficient documentation

## 2018-02-06 DIAGNOSIS — I4891 Unspecified atrial fibrillation: Secondary | ICD-10-CM | POA: Diagnosis present

## 2018-02-06 DIAGNOSIS — Z87891 Personal history of nicotine dependence: Secondary | ICD-10-CM | POA: Diagnosis not present

## 2018-02-06 DIAGNOSIS — Z79899 Other long term (current) drug therapy: Secondary | ICD-10-CM | POA: Insufficient documentation

## 2018-02-06 DIAGNOSIS — Z8673 Personal history of transient ischemic attack (TIA), and cerebral infarction without residual deficits: Secondary | ICD-10-CM | POA: Insufficient documentation

## 2018-02-06 DIAGNOSIS — G4733 Obstructive sleep apnea (adult) (pediatric): Secondary | ICD-10-CM | POA: Insufficient documentation

## 2018-02-06 DIAGNOSIS — Z7901 Long term (current) use of anticoagulants: Secondary | ICD-10-CM | POA: Insufficient documentation

## 2018-02-06 DIAGNOSIS — E119 Type 2 diabetes mellitus without complications: Secondary | ICD-10-CM | POA: Insufficient documentation

## 2018-02-06 DIAGNOSIS — Z823 Family history of stroke: Secondary | ICD-10-CM | POA: Diagnosis not present

## 2018-02-06 DIAGNOSIS — I5032 Chronic diastolic (congestive) heart failure: Secondary | ICD-10-CM | POA: Diagnosis not present

## 2018-02-06 DIAGNOSIS — I2510 Atherosclerotic heart disease of native coronary artery without angina pectoris: Secondary | ICD-10-CM | POA: Insufficient documentation

## 2018-02-06 DIAGNOSIS — Z794 Long term (current) use of insulin: Secondary | ICD-10-CM | POA: Diagnosis not present

## 2018-02-06 DIAGNOSIS — I484 Atypical atrial flutter: Secondary | ICD-10-CM | POA: Insufficient documentation

## 2018-02-06 DIAGNOSIS — Z888 Allergy status to other drugs, medicaments and biological substances status: Secondary | ICD-10-CM | POA: Diagnosis not present

## 2018-02-06 NOTE — Telephone Encounter (Signed)
Is this ok to refill?  

## 2018-02-06 NOTE — Telephone Encounter (Signed)
Send 30 day supply and make med check appt

## 2018-02-06 NOTE — Progress Notes (Signed)
Primary Care Physician: Carlena Hurl, PA-C Referring Physician: Dr. Loistine Chance Kassabian is a 67 y.o. male with a h/o CAD, CVA, DM II,atial flutter/fib with ablations in 2010/2016. He recently had a penile implant and was on Lovenox as a bridge and when coumadin was restarted, he had bleeding into his scrotum with significant enlargement and pain. During this time he went back into afib and presented to the ER but self converted. He recently saw Dr. Radford Pax and she sent him here to discuss means to control afib. He was in SR then and has not felt any more afib. The pt states that unless his afib burden increases, he does not want to make changes. He feels the afib was 2/2 the stress and pain that he was in with the scrotal bleeding/ pain.  Today, he denies symptoms of palpitations, chest pain, shortness of breath, orthopnea, PND, lower extremity edema, dizziness, presyncope, syncope, or neurologic sequela. The patient is tolerating medications without difficulties and is otherwise without complaint today.   Past Medical History:  Diagnosis Date  . Atrial flutter ([place])    a. atypical atrial flutter.  . Bell's palsy   . CAD (coronary artery disease)    a. 07/2009 Cath: LM nl, LAD nl, LCX nl w/ L->R collats, chronically occluded RCA s/p failed PCI;  b. 07/2012 MV: basal inf mild ischemia.  . Central retinal artery occlusion, left eye    diagnosed in 2008  . Cerebrovascular accident [facility])    a. 03/2008 secondary to cardioembolic event;  b. chronic coumadin.  . Chronic diastolic CHF (congestive heart failure) ([place])    a. 12/2008 Echo: nl EF.  . Diabetes mellitus 11/14/2010   TYPE II  . Hematuria 05/2013   Urology, Dr. Estill Dooms  . Hyperlipidemia   . Hypertension   . Hypogonadism, male 05/2013   Dr. Estill Dooms, Urology  . Morbid obesity ([place])   . Obesity   . Obstructive sleep apnea   . Persistent atrial fibrillation ([place])    a. s/p afib ablation by [facility] 08/01/08  . Recommendation refused by  patient    multiple recommended vaccines refused over the years (flu/pneumovax)  . Skin infection    chronic left leg herpetic   Past Surgical History:  Procedure Laterality Date  . ATRIAL ABLATION SURGERY  08/01/08   CTI and PVI ablation by [facility]  . COLONOSCOPY     2012 per patient  . ELECTROPHYSIOLOGIC STUDY N/A 11/30/2014   Procedure: Atrial Fibrillation Ablation;  Surgeon: [name], MD;  Location: [facility] CV LAB;  Service: Cardiovascular;  Laterality: N/A;  . TEE WITHOUT CARDIOVERSION N/A 11/30/2014   Procedure: TRANSESOPHAGEAL ECHOCARDIOGRAM (TEE);  Surgeon: Josue Hector, MD;  Location: [facility] ENDOSCOPY;  Service: Cardiovascular;  Laterality: N/A;    Current Outpatient Medications  Medication Sig Dispense Refill  . ACCU-CHEK FASTCLIX LANCETS MISC Test blood sugar bid. Dx code E11.9 102 each 1  . Alirocumab (PRALUENT) 75 MG/ML SOPN Inject 1 pen into the skin every 14 (fourteen) days. 2 pen 11  . amLODipine (NORVASC) 5 MG tablet Take 1 tablet (5 mg total) by mouth daily. 90 tablet 1  . atorvastatin (LIPITOR) 80 MG tablet Take 1 tablet (80 mg total) by mouth daily. 90 tablet 1  . b complex vitamins capsule Take 1 capsule by mouth every other day.     [name] BYDUREON BCISE 2 MG/0.85ML AUIJ INJECT 2 MG INTO THE SKIN ONCE A WEEK 4 pen 0  . Cholecalciferol (VITAMIN  D3) 10000 UNITS capsule Take 1 capsule (10,000 Units total) by mouth daily. 4 capsule 0  . glucose blood (ACCU-CHEK AVIVA PLUS) test strip 1 each by Other route 2 (two) times daily. Test blood glucose twice daily. Dx Code E11.9 50 each 10  . insulin degludec (TRESIBA FLEXTOUCH) 100 UNIT/ML SOPN FlexTouch Pen INJECT 10 UNITS INTO THE SKIN DAILY AT 10 PM 3 mL 2  . isosorbide mononitrate (IMDUR) 30 MG 24 hr tablet TAKE 1 TABLET BY MOUTH ONCE DAILY 90 tablet 3  . losartan (COZAAR) 100 MG tablet TAKE 1 TABLET BY MOUTH ONCE DAILY 90 tablet 3  . metFORMIN (GLUCOPHAGE XR) 500 MG 24 hr tablet Take 2 tablets (1,000 mg total) by mouth daily  with breakfast. 180 tablet 0  . metoprolol tartrate (LOPRESSOR) 50 MG tablet TAKE 1 TABLET BY MOUTH TWICE DAILY 240 tablet 0  . omega-3 acid ethyl esters (LOVAZA) 1 g capsule Take 2 capsules (2 g total) by mouth 2 (two) times daily. 360 capsule 3  . Testosterone (ANDROGEL PUMP) 12.5 MG/ACT (1%) GEL Place 2 application onto the skin daily.     . valACYclovir (VALTREX) 500 MG tablet take 1 tablet by mouth once daily 90 tablet 1  . warfarin (COUMADIN) 5 MG tablet TAKE AS DIRECTED BY  COUMADIN  CLINIC 50 tablet 2   No current facility-administered medications for this encounter.     Allergies  Allergen Reactions  . Crestor [Rosuvastatin] Other (See Comments)    Made him feel crazy  . Zetia [Ezetimibe] Other (See Comments)    Made him feel crazy  . Lisinopril Cough  . Wilder Glade [Dapagliflozin]     Made heart race    Social History   Socioeconomic History  . Marital status: Married    Spouse name: Not on file  . Number of children: Not on file  . Years of education: Not on file  . Highest education level: Not on file  Occupational History  . Not on file  Social Needs  . Financial resource strain: Not on file  . Food insecurity:    Worry: Not on file    Inability: Not on file  . Transportation needs:    Medical: Not on file    Non-medical: Not on file  Tobacco Use  . Smoking status: Former Smoker    Last attempt to quit: 07/24/1979    Years since quitting: 38.5  . Smokeless tobacco: Never Used  Substance and Sexual Activity  . Alcohol use: No    Comment: former  . Drug use: No    Comment: former  . Sexual activity: Not on file  Lifestyle  . Physical activity:    Days per week: Not on file    Minutes per session: Not on file  . Stress: Not on file  Relationships  . Social connections:    Talks on phone: Not on file    Gets together: Not on file    Attends religious service: Not on file    Active member of club or organization: Not on file    Attends meetings of clubs  or organizations: Not on file    Relationship status: Not on file  . Intimate partner violence:    Fear of current or ex partner: Not on file    Emotionally abused: Not on file    Physically abused: Not on file    Forced sexual activity: Not on file  Other Topics Concern  . Not on file  Social History  Narrative   Lives in [place], [place]. With his spouse and son. Has remote history of tobacco, but quit 30 years ago. Has heavy history of alcohol consumption but quit 30 years ago. Previously used marijuana and cocaine, but denies use over the past 30 years. 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     Family History  Problem Relation Age of Onset  . Stroke Father   . Heart disease Father   . Heart disease Mother     ROS- All systems are reviewed and negative except as per the HPI above  Physical Exam: Vitals:   02/06/18 1500  BP: 126/78  Pulse: 87  SpO2: 95%  Weight: 265 lb 9.6 oz (120.5 kg)  Height: 5' 10.5" (1.791 m)   Wt Readings from Last 3 Encounters:  02/06/18 265 lb 9.6 oz (120.5 kg)  01/28/18 260 lb 12.8 oz (118.3 kg)  12/02/17 285 lb 12.8 oz (129.6 kg)    Labs: Lab Results  Component Value Date   NA 136 01/26/2018   K 4.2 01/26/2018   CL 105 01/26/2018   CO2 18 (L) 01/26/2018   GLUCOSE 132 (H) 01/26/2018   BUN 14 01/26/2018   CREATININE 0.93 01/26/2018   CALCIUM 9.5 01/26/2018   MG 2.1 05/14/2008   Lab Results  Component Value Date   INR 1.3 (A) 02/03/2018   Lab Results  Component Value Date   CHOL 147 07/30/2017   HDL 45 07/30/2017   LDLCALC 65 07/30/2017   TRIG 278 (H) 07/30/2017     GEN- The patient is well appearing, alert and oriented x 3 today.   Head- normocephalic, atraumatic Eyes-  Sclera clear, conjunctiva pink Ears- hearing intact Oropharynx- clear Neck- supple, no JVP Lymph- no cervical lymphadenopathy Lungs- Clear to ausculation bilaterally, normal work of breathing Heart- Regular rate and rhythm, no murmurs, rubs or  gallops, PMI not laterally displaced GI- soft, NT, ND, + BS Extremities- no clubbing, cyanosis, or edema MS- no significant deformity or atrophy Skin- no rash or lesion Psych- euthymic mood, full affect Neuro- strength and sensation are intact  EKG- NSR at 81 bpm Echo-Study Conclusions  - Left ventricle: The cavity size was normal. There was moderate   focal basal hypertrophy of the septum. Systolic function was   normal. The estimated ejection fraction was in the range of 60%   to 65%. Wall motion was normal; there were no regional wall   motion abnormalities. Doppler parameters are consistent with   pseudonormal left ventricular relaxation (grade 2 diastolic   dysfunction). The E/A ratio is 1.8, the E/e&' ratio is >15,   suggesting elevated LV filling pressure. - Aortic valve: Trileaflet; mildly calcified leaflets. - Mitral valve: Calcified annulus. There was trivial regurgitation. - Left atrium: The atrium was at the upper limits of normal in   size. - Tricuspid valve: There was moderate regurgitation. - Pulmonary arteries: PA peak pressure: 48 mm Hg (S). - Inferior vena cava: The vessel was normal in size. The   respirophasic diameter changes were in the normal range (= 50%),   consistent with normal central venous pressure.  Impressions:  - LVEF 60-65%, moderate focal basal septal hypertrophy without LVOT   obstruction, normal wall motion, Stage 2 diastolic dysfunction,   upper normal LV size, MAC, trivial MR, moderate TR, RVSP 48 mmHg,   normal IVC size.   Assessment and Plan: 1. Paroxysmal afib  S/p 2 ablations Recent episode of afib that self converted in the setting of scrotal swelling/bleeding /  pain s/p penile implant He is in SR and has not had any further issues Options to ensure maintenance for SR include Tikosyn, on young side for amiodarone or repeat ablation The pt is clear that he does not want any changes unless his afib burden significantly  increases  afib clinic as needed  Butch Penny C. Skarlette Lattner, [facility] [address] [place], [place] [address] [phone]

## 2018-02-06 NOTE — Telephone Encounter (Signed)
Left message for patient to call back to schedule med check appointment within 30 days.   I refilled RX for 30 days only.

## 2018-02-11 ENCOUNTER — Ambulatory Visit (INDEPENDENT_AMBULATORY_CARE_PROVIDER_SITE_OTHER): Payer: Medicare Other | Admitting: Pharmacist

## 2018-02-11 ENCOUNTER — Ambulatory Visit (INDEPENDENT_AMBULATORY_CARE_PROVIDER_SITE_OTHER): Payer: Medicare Other | Admitting: Medical

## 2018-02-11 VITALS — BP 110/70 | HR 77 | Temp 97.7°F | Resp 16 | Ht 71.0 in | Wt 268.2 lb

## 2018-02-11 DIAGNOSIS — G4733 Obstructive sleep apnea (adult) (pediatric): Secondary | ICD-10-CM | POA: Diagnosis not present

## 2018-02-11 DIAGNOSIS — Z5181 Encounter for therapeutic drug level monitoring: Secondary | ICD-10-CM

## 2018-02-11 DIAGNOSIS — I2510 Atherosclerotic heart disease of native coronary artery without angina pectoris: Secondary | ICD-10-CM

## 2018-02-11 DIAGNOSIS — I4819 Other persistent atrial fibrillation: Secondary | ICD-10-CM

## 2018-02-11 DIAGNOSIS — E118 Type 2 diabetes mellitus with unspecified complications: Secondary | ICD-10-CM

## 2018-02-11 DIAGNOSIS — I10 Essential (primary) hypertension: Secondary | ICD-10-CM

## 2018-02-11 DIAGNOSIS — I5032 Chronic diastolic (congestive) heart failure: Secondary | ICD-10-CM

## 2018-02-11 DIAGNOSIS — I481 Persistent atrial fibrillation: Secondary | ICD-10-CM | POA: Diagnosis not present

## 2018-02-11 LAB — POCT INR: INR: 1.9 — AB (ref 2.0–3.0)

## 2018-02-11 NOTE — Patient Instructions (Signed)
Description   Continue same dose of 1.5 tablets every day except 1 tablet on Sundays and Thursdays. Recheck INR on 1 week.  Call coumadin clinic at [phone] for any concerns.

## 2018-02-11 NOTE — Progress Notes (Signed)
Subjective: Chief Complaint  Patient presents with  . med check   Here for med check mainly on diabetes.  He has had significant health issues since I last saw him.  He ended up having surgery to urology at [facility] back in May but had complications and bleeding.  During this time ended up having an episode of atrial fibrillation, has had multiple emergency department visits in recent months with these issues  Regarding diabetes he has no major concerns right now, sugars looking okay, compliant with medication  He is taking his Bcise Bydureon pen weekly, Tresiba 10u daily, metformin 500 daily  He is healing and slowly recovering from his urologic surgery.  He has follow-up with urology on 02/27/2018.  He has no new concerns today.  Past Medical History:  Diagnosis Date  . Atrial flutter ([place])    a. atypical atrial flutter.  . Bell's palsy   . CAD (coronary artery disease)    a. 07/2009 Cath: LM nl, LAD nl, LCX nl w/ L->R collats, chronically occluded RCA s/p failed PCI;  b. 07/2012 MV: basal inf mild ischemia.  . Central retinal artery occlusion, left eye    diagnosed in 2008  . Cerebrovascular accident [facility])    a. 03/2008 secondary to cardioembolic event;  b. chronic coumadin.  . Chronic diastolic CHF (congestive heart failure) ([place])    a. 12/2008 Echo: nl EF.  . Diabetes mellitus 11/14/2010   TYPE II  . Hematuria 05/2013   Urology, Dr. Estill Dooms  . Hyperlipidemia   . Hypertension   . Hypogonadism, male 05/2013   Dr. Estill Dooms, Urology  . Morbid obesity ([place])   . Obesity   . Obstructive sleep apnea   . Persistent atrial fibrillation ([place])    a. s/p afib ablation by [facility] 08/01/08  . Recommendation refused by patient    multiple recommended vaccines refused over the years (flu/pneumovax)  . Skin infection    chronic left leg herpetic   Current Outpatient Medications on File Prior to Visit  Medication Sig Dispense Refill  . ACCU-CHEK FASTCLIX LANCETS MISC Test blood  sugar bid. Dx code E11.9 102 each 1  . Alirocumab (PRALUENT) 75 MG/ML SOPN Inject 1 pen into the skin every 14 (fourteen) days. 2 pen 11  . amLODipine (NORVASC) 5 MG tablet Take 1 tablet (5 mg total) by mouth daily. 90 tablet 1  . atorvastatin (LIPITOR) 80 MG tablet Take 1 tablet (80 mg total) by mouth daily. 90 tablet 1  . b complex vitamins capsule Take 1 capsule by mouth every other day.     [name] BYDUREON BCISE 2 MG/0.85ML AUIJ INJECT 2 MG INTO THE SKIN ONCE A WEEK 4 pen 0  . Cholecalciferol (VITAMIN D3) 10000 UNITS capsule Take 1 capsule (10,000 Units total) by mouth daily. 4 capsule 0  . glucose blood (ACCU-CHEK AVIVA PLUS) test strip 1 each by Other route 2 (two) times daily. Test blood glucose twice daily. Dx Code E11.9 50 each 10  . insulin degludec (TRESIBA FLEXTOUCH) 100 UNIT/ML SOPN FlexTouch Pen INJECT 10 UNITS INTO THE SKIN DAILY AT 10 PM 3 mL 2  . isosorbide mononitrate (IMDUR) 30 MG 24 hr tablet TAKE 1 TABLET BY MOUTH ONCE DAILY 90 tablet 3  . losartan (COZAAR) 100 MG tablet TAKE 1 TABLET BY MOUTH ONCE DAILY 90 tablet 3  . metFORMIN (GLUCOPHAGE XR) 500 MG 24 hr tablet Take 2 tablets (1,000 mg total) by mouth daily with breakfast. 180 tablet 0  .  metoprolol tartrate (LOPRESSOR) 50 MG tablet TAKE 1 TABLET BY MOUTH TWICE DAILY 240 tablet 0  . omega-3 acid ethyl esters (LOVAZA) 1 g capsule Take 2 capsules (2 g total) by mouth 2 (two) times daily. 360 capsule 3  . Testosterone (ANDROGEL PUMP) 12.5 MG/ACT (1%) GEL Place 2 application onto the skin daily.     . valACYclovir (VALTREX) 500 MG tablet take 1 tablet by mouth once daily 90 tablet 1  . warfarin (COUMADIN) 5 MG tablet TAKE AS DIRECTED BY  COUMADIN  CLINIC 50 tablet 2   No current facility-administered medications on file prior to visit.    ROS as in subjective    Objective: BP 110/70   Pulse 77   Temp 97.7 F (36.5 C) (Oral)   Resp 16   Ht 5\' 11"  (1.803 m)   Wt 268 lb 3.2 oz (121.7 kg)   SpO2 95%   BMI 37.41 kg/m    Wt Readings from Last 3 Encounters:  02/11/18 268 lb 3.2 oz (121.7 kg)  02/06/18 265 lb 9.6 oz (120.5 kg)  01/28/18 260 lb 12.8 oz (118.3 kg)   Of note, prior weights: 12/19/2016 with a weight of 284 pounds 07/28/2015 with a weight of 284 pounds 06/22/2014 with a weight of 269 pounds  Gen: wd, wn, nad Lungs clear Heart rrr, normal s1, s2, no murmurs Ext: no edema Pulses WNL     Assessment: Encounter Diagnoses  Name Primary?  . Diabetes mellitus with complication ([place]) Yes  . Morbid obesity ([place])   . OSA (obstructive sleep apnea)   . Chronic diastolic heart failure ([place])   . Coronary artery disease involving native coronary artery of native heart without angina pectoris   . Essential hypertension, benign       Plan: Diabetes- continue glucose monitoring.  Hemoglobin A1c 5.9% today.    Current medications including Metformin XR 500 mg daily, Bcise Bydureon pen weekly, Tresiba 10 units daily.    Obesity- looking back he is remained fairly stable with his current weight, and he was more in the 280 pound range a year ago and for the last few years.  I recommend referral to weight management clinic but he declines.  At this point I offered to change him from Bydureon to either Ozempic or Victoza or Saxenda for better help with weight loss and diabetes control.  he will check his insurance and let me know  Counseled on diet and exercise, but after numerous visits with him for the same I doubt he will make any real changes with his diet and exercise.  He usually gives the same reply that he gets his exercise chasing on his adolescent son.  Continue daily foot checks, yearly eye doctor follow-up  I reviewed his cardiology note with Dr. Radford Pax from 01/28/2018 and her notes showed the following: Hypertension-he was continued on Lopressor 50mg  twice daily,Losartan 100 mg daily, and Amlodipine was decreased to 5 mg due to some low blood pressure readings  Chronic CHF- his weight was stable  was at that visit and was not requiring diuretics.  He was also compliant with CPAP.  Regarding atherosclerotic heart disease was continued on Imdur daily beta-blocker, Praluent and statin    Persistent atrial fibrillation- at that time was normal sinus rhythm, converted spontaneously in the emergency department prior to that visit, continued on beta-blocker and warfarin.    Hyperlipidemia-Continued on atorvastatin 80 mg daily and Praluent   I also reviewed his 02/06/2018 A. fib clinic notes.  There are some options discussed for long-term maintenance of sinus rhythm but at this time things were kept the same   Nikko was seen today for med check.  Diagnoses and all orders for this visit:  Diabetes mellitus with complication ([place]) -     HgB A1c  Morbid obesity ([facility])  OSA (obstructive sleep apnea)  Chronic diastolic heart failure ([facility])  Coronary artery disease involving native coronary artery of native heart without angina pectoris  Essential hypertension, benign

## 2018-02-12 LAB — POCT GLYCOSYLATED HEMOGLOBIN (HGB A1C): Hemoglobin A1C: 5.9 % — AB (ref 4.0–5.6)

## 2018-02-14 ENCOUNTER — Telehealth: Payer: Self-pay | Admitting: *Deleted

## 2018-02-14 NOTE — Telephone Encounter (Signed)
-----   Message from Traci R Turner, MD sent at 02/09/2018 12:25 PM EDT ----- Good AHI and compliance.  Continue current PAP settings. 

## 2018-02-14 NOTE — Telephone Encounter (Signed)
Informed patient of compliance results and patient understanding was verbalized. Patient is aware and agreeable to AHI being within range at 0.3. Patient is aware and agreeable to being in compliance with machine usage. Patient is aware and agreeable to no change in current pressures.

## 2018-02-18 ENCOUNTER — Telehealth: Payer: Self-pay | Admitting: Medical

## 2018-02-18 ENCOUNTER — Ambulatory Visit (INDEPENDENT_AMBULATORY_CARE_PROVIDER_SITE_OTHER): Payer: Medicare Other

## 2018-02-18 DIAGNOSIS — Z5181 Encounter for therapeutic drug level monitoring: Secondary | ICD-10-CM

## 2018-02-18 DIAGNOSIS — I481 Persistent atrial fibrillation: Secondary | ICD-10-CM

## 2018-02-18 DIAGNOSIS — I4819 Other persistent atrial fibrillation: Secondary | ICD-10-CM

## 2018-02-18 LAB — POCT INR: INR: 2.2 (ref 2.0–3.0)

## 2018-02-18 NOTE — Telephone Encounter (Signed)
Patient states that he is on 5mg  Amlodipine daily and he was at Dr Theodosia Blender office and he would verify this with them.  I also told him to verify with his bottles when he gets home.

## 2018-02-18 NOTE — Patient Instructions (Signed)
Description   Continue same dose of 1.5 tablets every day except 1 tablet on Sundays and Thursdays. Recheck INR on 2 weeks.  Call coumadin clinic at [phone] for any concerns.

## 2018-02-18 NOTE — Telephone Encounter (Signed)
Please call and verify he is taking amlodipine 5 mg and not 10 mg daily.  I got a notice from pharmacy that he had picked up the 10 mg dose from June that our office apparently refilled but then his dose was changed by his cardiologist and his new dose of 5 mg was picked up on 01/28/2018.  Thus I want to make sure he is not getting 2 different doses from the pharmacy at this point.

## 2018-02-20 ENCOUNTER — Other Ambulatory Visit: Payer: Self-pay | Admitting: Medical

## 2018-03-04 ENCOUNTER — Ambulatory Visit (INDEPENDENT_AMBULATORY_CARE_PROVIDER_SITE_OTHER): Payer: Medicare Other

## 2018-03-04 ENCOUNTER — Encounter (INDEPENDENT_AMBULATORY_CARE_PROVIDER_SITE_OTHER): Payer: Self-pay

## 2018-03-04 DIAGNOSIS — Z5181 Encounter for therapeutic drug level monitoring: Secondary | ICD-10-CM | POA: Diagnosis not present

## 2018-03-04 DIAGNOSIS — I481 Persistent atrial fibrillation: Secondary | ICD-10-CM | POA: Diagnosis not present

## 2018-03-04 DIAGNOSIS — I4819 Other persistent atrial fibrillation: Secondary | ICD-10-CM

## 2018-03-04 LAB — POCT INR: INR: 2.4 (ref 2.0–3.0)

## 2018-03-04 NOTE — Patient Instructions (Signed)
Description   Continue same dose of 1.5 tablets every day except 1 tablet on Sundays and Thursdays. Recheck INR on 4 weeks.  Call coumadin clinic at [phone] for any concerns.

## 2018-03-05 ENCOUNTER — Telehealth: Payer: Self-pay | Admitting: Medical

## 2018-03-05 ENCOUNTER — Other Ambulatory Visit: Payer: Self-pay | Admitting: Medical

## 2018-03-05 NOTE — Telephone Encounter (Signed)
Find out what the issue is.  Is it too painful, too large of a needle, or what is going on?   We could possibly change to Ozempic weekly.  Kirke Shaggy is daily and likely not covered by his insurance.

## 2018-03-05 NOTE — Telephone Encounter (Signed)
Tried calling patient. There was no answer

## 2018-03-05 NOTE — Telephone Encounter (Signed)
Pt come in and states that the bydueron is not working it can not get the needle to stick in to his stomach, he is wanting to switch to the [place] pt can be reached at  [phone] and pt uses [facility] [place] - [address] [place]

## 2018-03-05 NOTE — Telephone Encounter (Signed)
forwarding to you  °

## 2018-03-06 NOTE — Telephone Encounter (Signed)
Pt stated when trying to use Bydureon, he put it to his skin and press the button but nothing happens. When he take it from his skin and do the same thing it all squirts out on the table. Patient stated this has been happening for months now. I offered to have patient come in and so I could make sure we're using the pen right but he said he don't have anymore left and he don't want to use the Bydureon anymore. Pt now wants to try Ozempic. Please advise.

## 2018-03-06 NOTE — Telephone Encounter (Signed)
lmom asking patient to call the office about possible changing a medication.

## 2018-03-06 NOTE — Telephone Encounter (Signed)
Have him come in for nurse visit for nurse to inject dose of Ozempic into skin to make sure it works properly.  We will start Ozempic 0.25mg  weekly, assuming we have a sample.  Reserve a sample in refrigerator for him

## 2018-03-10 ENCOUNTER — Other Ambulatory Visit: Payer: Self-pay | Admitting: Medical

## 2018-03-10 MED ORDER — LIRAGLUTIDE -WEIGHT MANAGEMENT 18 MG/3ML ~~LOC~~ SOPN: 1.8000 mg | PEN_INJECTOR | Freq: Every day | SUBCUTANEOUS | 5 refills | Status: DC

## 2018-03-10 NOTE — Telephone Encounter (Signed)
[name] please send in a new RX for Saxenda first, patient wants to try to see if insurance co will cover it and then if it doesn't he will try the ozempic.   Thank you

## 2018-03-10 NOTE — Telephone Encounter (Signed)
Please review message below.

## 2018-03-18 ENCOUNTER — Telehealth: Payer: Self-pay | Admitting: Medical

## 2018-03-18 NOTE — Telephone Encounter (Signed)
Recv'd fax P.A. SAXENDA, called [facility] [place] [facility] t# [phone], Kirke Shaggy is plan exlcusion also state meds must all go thru Med part D before going thru Medicaid to be paid.  Called pharmacy and states not covered by Medicare either.   Do you want to switch to Victoza and see if this is covered and if not it would be a lot easier to get a P.A. Approved for Victoza.

## 2018-03-19 NOTE — Telephone Encounter (Signed)
I had previously wanted to try weekly Ozempic instead of daily Victoza.  Do think we have as good of chance trying to get Ozempic approved?

## 2018-03-21 ENCOUNTER — Other Ambulatory Visit: Payer: Self-pay | Admitting: Medical

## 2018-03-21 MED ORDER — SEMAGLUTIDE(0.25 OR 0.5MG/DOS) 2 MG/1.5ML ~~LOC~~ SOPN: 0.2500 mg | PEN_INJECTOR | SUBCUTANEOUS | 2 refills | Status: DC

## 2018-03-21 NOTE — Telephone Encounter (Signed)
Will you send in order for Ozempic to [facility]

## 2018-03-21 NOTE — Telephone Encounter (Signed)
done

## 2018-03-29 NOTE — Telephone Encounter (Signed)
[facility] went thru ins for $0, called wife & informed

## 2018-03-31 DIAGNOSIS — N5089 Other specified disorders of the male genital organs: Secondary | ICD-10-CM | POA: Diagnosis not present

## 2018-03-31 DIAGNOSIS — N481 Balanitis: Secondary | ICD-10-CM | POA: Diagnosis not present

## 2018-03-31 DIAGNOSIS — N5201 Erectile dysfunction due to arterial insufficiency: Secondary | ICD-10-CM | POA: Diagnosis not present

## 2018-04-01 ENCOUNTER — Ambulatory Visit (INDEPENDENT_AMBULATORY_CARE_PROVIDER_SITE_OTHER): Payer: Medicare Other | Admitting: *Deleted

## 2018-04-01 ENCOUNTER — Other Ambulatory Visit: Payer: Self-pay | Admitting: Pharmacist

## 2018-04-01 DIAGNOSIS — I4819 Other persistent atrial fibrillation: Secondary | ICD-10-CM

## 2018-04-01 DIAGNOSIS — I481 Persistent atrial fibrillation: Secondary | ICD-10-CM | POA: Diagnosis not present

## 2018-04-01 DIAGNOSIS — Z5181 Encounter for therapeutic drug level monitoring: Secondary | ICD-10-CM | POA: Diagnosis not present

## 2018-04-01 LAB — POCT INR: INR: 2.1 (ref 2.0–3.0)

## 2018-04-01 MED ORDER — ALIROCUMAB 75 MG/ML ~~LOC~~ SOPN: 1.0000 "pen " | PEN_INJECTOR | SUBCUTANEOUS | 11 refills | Status: DC

## 2018-04-01 NOTE — Patient Instructions (Signed)
Description   Continue same dose of 1.5 tablets every day except 1 tablet on Sundays and Thursdays. Recheck INR on 6 weeks.  Call coumadin clinic at [phone] for any concerns.     

## 2018-04-08 DIAGNOSIS — N528 Other male erectile dysfunction: Secondary | ICD-10-CM | POA: Diagnosis not present

## 2018-04-30 ENCOUNTER — Encounter (INDEPENDENT_AMBULATORY_CARE_PROVIDER_SITE_OTHER): Payer: Medicare Other | Admitting: Ophthalmology

## 2018-04-30 DIAGNOSIS — H35033 Hypertensive retinopathy, bilateral: Secondary | ICD-10-CM | POA: Diagnosis not present

## 2018-04-30 DIAGNOSIS — E113393 Type 2 diabetes mellitus with moderate nonproliferative diabetic retinopathy without macular edema, bilateral: Secondary | ICD-10-CM | POA: Diagnosis not present

## 2018-04-30 DIAGNOSIS — H43813 Vitreous degeneration, bilateral: Secondary | ICD-10-CM

## 2018-04-30 DIAGNOSIS — E11319 Type 2 diabetes mellitus with unspecified diabetic retinopathy without macular edema: Secondary | ICD-10-CM | POA: Diagnosis not present

## 2018-04-30 DIAGNOSIS — I10 Essential (primary) hypertension: Secondary | ICD-10-CM

## 2018-04-30 DIAGNOSIS — H348122 Central retinal vein occlusion, left eye, stable: Secondary | ICD-10-CM | POA: Diagnosis not present

## 2018-05-07 ENCOUNTER — Other Ambulatory Visit: Payer: Self-pay | Admitting: Medical

## 2018-05-19 ENCOUNTER — Ambulatory Visit (INDEPENDENT_AMBULATORY_CARE_PROVIDER_SITE_OTHER): Payer: Medicare Other | Admitting: Medical

## 2018-05-19 ENCOUNTER — Encounter: Payer: Self-pay | Admitting: Medical

## 2018-05-19 VITALS — BP 120/70 | HR 81 | Temp 98.2°F | Resp 16 | Ht 71.0 in | Wt 271.0 lb

## 2018-05-19 DIAGNOSIS — R059 Cough, unspecified: Secondary | ICD-10-CM

## 2018-05-19 DIAGNOSIS — J0110 Acute frontal sinusitis, unspecified: Secondary | ICD-10-CM

## 2018-05-19 DIAGNOSIS — I6350 Cerebral infarction due to unspecified occlusion or stenosis of unspecified cerebral artery: Secondary | ICD-10-CM

## 2018-05-19 DIAGNOSIS — R05 Cough: Secondary | ICD-10-CM

## 2018-05-19 MED ORDER — ALBUTEROL SULFATE HFA 108 (90 BASE) MCG/ACT IN AERS: 2.0000 | INHALATION_SPRAY | Freq: Four times a day (QID) | RESPIRATORY_TRACT | 0 refills | Status: DC | PRN

## 2018-05-19 MED ORDER — AMOXICILLIN 500 MG PO TABS: ORAL_TABLET | ORAL | 0 refills | Status: DC

## 2018-05-19 NOTE — Progress Notes (Signed)
Subjective:  [name] is a 67 y.o. male who presents for  Chief Complaint  Patient presents with  . cough    cough, congestion, snot is gray green X 2 weeks   Symptoms include 2 weeks of sinus pressure, congestion, mucous out nose.   Some blood tinged mucous.  Few days felt feverish.   Using some Coricidin HBP.   Wife has been sick with similar.  Not feeling well. No other aggravating or relieving factors.  No other complaint.    Past Medical History:  Diagnosis Date  . Atrial flutter ([place])    a. atypical atrial flutter.  . Bell's palsy   . CAD (coronary artery disease)    a. 07/2009 Cath: LM nl, LAD nl, LCX nl w/ L->R collats, chronically occluded RCA s/p failed PCI;  b. 07/2012 MV: basal inf mild ischemia.  . Central retinal artery occlusion, left eye    diagnosed in 2008  . Cerebrovascular accident [facility])    a. 03/2008 secondary to cardioembolic event;  b. chronic coumadin.  . Chronic diastolic CHF (congestive heart failure) ([place])    a. 12/2008 Echo: nl EF.  . Diabetes mellitus 11/14/2010   TYPE II  . Hematuria 05/2013   Urology, Dr. Estill Dooms  . Hyperlipidemia   . Hypertension   . Hypogonadism, male 05/2013   Dr. Estill Dooms, Urology  . Morbid obesity ([place])   . Obesity   . Obstructive sleep apnea   . Persistent atrial fibrillation    a. s/p afib ablation by [facility] 08/01/08  . Recommendation refused by patient    multiple recommended vaccines refused over the years (flu/pneumovax)  . Skin infection    chronic left leg herpetic    Current Outpatient Medications on File Prior to Visit  Medication Sig Dispense Refill  . ACCU-CHEK FASTCLIX LANCETS MISC Test blood sugar bid. Dx code E11.9 102 each 1  . Alirocumab (PRALUENT) 75 MG/ML SOPN Inject 1 pen into the skin every 14 (fourteen) days. 2 pen 11  . amLODipine (NORVASC) 5 MG tablet Take 1 tablet (5 mg total) by mouth daily. 90 tablet 1  . atorvastatin (LIPITOR) 80 MG tablet TAKE 1 TABLET BY MOUTH ONCE DAILY 90 tablet 0  . b complex  vitamins capsule Take 1 capsule by mouth every other day.     . Cholecalciferol (VITAMIN D3) 10000 UNITS capsule Take 1 capsule (10,000 Units total) by mouth daily. 4 capsule 0  . glucose blood (ACCU-CHEK AVIVA PLUS) test strip 1 each by Other route 2 (two) times daily. Test blood glucose twice daily. Dx Code E11.9 50 each 10  . insulin degludec (TRESIBA FLEXTOUCH) 100 UNIT/ML SOPN FlexTouch Pen INJECT 10 UNITS INTO THE SKIN DAILY AT 10 PM 3 mL 2  . isosorbide mononitrate (IMDUR) 30 MG 24 hr tablet TAKE 1 TABLET BY MOUTH ONCE DAILY 90 tablet 3  . losartan (COZAAR) 100 MG tablet TAKE 1 TABLET BY MOUTH ONCE DAILY 90 tablet 3  . metFORMIN (GLUCOPHAGE-XR) 500 MG 24 hr tablet TAKE 2 TABLETS BY MOUTH ONCE DAILY WITH BREAKFAST 180 tablet 0  . metoprolol tartrate (LOPRESSOR) 50 MG tablet TAKE 1 TABLET BY MOUTH TWICE DAILY 240 tablet 0  . omega-3 acid ethyl esters (LOVAZA) 1 g capsule Take 2 capsules (2 g total) by mouth 2 (two) times daily. 360 capsule 3  . Semaglutide (OZEMPIC) 0.25 or 0.5 MG/DOSE SOPN Inject 0.25 mg into the skin once a week. 1.5 mL 2  . Testosterone (ANDROGEL PUMP) 12.5 MG/ACT (1%)  GEL Place 2 application onto the skin daily.     . valACYclovir (VALTREX) 500 MG tablet TAKE 1 TABLET BY MOUTH ONCE DAILY 90 tablet 0  . warfarin (COUMADIN) 5 MG tablet TAKE AS DIRECTED BY  COUMADIN  CLINIC 50 tablet 2   No current facility-administered medications on file prior to visit.     ROS as in subjective   Objective: BP 120/70   Pulse 81   Temp 98.2 F (36.8 C) (Oral)   Resp 16   Ht 5\' 11"  (1.803 m)   Wt 271 lb (122.9 kg)   SpO2 96%   BMI 37.80 kg/m   Wt Readings from Last 3 Encounters:  05/19/18 271 lb (122.9 kg)  02/11/18 268 lb 3.2 oz (121.7 kg)  02/06/18 265 lb 9.6 oz (120.5 kg)    General appearance: Alert, well developed, well nourished, no distress                             Skin: warm, no rash                           Head: +frontal sinus tenderness,                             Eyes: conjunctiva pink, corneas clear                            Ears: flat left tympanic membrane, flat right tympanic membrane, external ear canals normal                          Nose: septum midline, turbinates swollen, with erythema and clear discharge             Mouth/throat: MMM, tongue normal, mild pharyngeal erythema                           Neck: supple, no adenopathy, no thyromegaly, non tender                         Lungs: coarse breath sounds, no wheezes, no rales, no rhonchi        Assessment  Encounter Diagnoses  Name Primary?  . Acute non-recurrent frontal sinusitis Yes  . Cough       Plan: Discussed diagnosis of sinusitis.   Discussed usual time frame to see improvement. Discussed possible complications or symptoms that would prompt call back or recheck within the next few days.     Medications prescribed:  Complete the course of amoxicillin antibiotic prescribed today.   Continue Coricidin HBP for cough suppression , can use Albuterol for cough, chest tightness.  Patient was advised to call or return if worse or not improving in the next few days.    Patient voiced understanding of diagnosis, recommendations, and treatment plan.  Chanz was seen today for cough.  Diagnoses and all orders for this visit:  Acute non-recurrent frontal sinusitis  Cough  Other orders -     amoxicillin (AMOXIL) 500 MG tablet; 2 tablets po BID x 10 days -     albuterol (PROVENTIL HFA;VENTOLIN HFA) 108 (90 Base) MCG/ACT inhaler; Inhale 2 puffs into the lungs every 6 (six) hours as needed for wheezing  or shortness of breath.

## 2018-05-26 ENCOUNTER — Other Ambulatory Visit: Payer: Self-pay | Admitting: Medical

## 2018-05-30 DIAGNOSIS — N5201 Erectile dysfunction due to arterial insufficiency: Secondary | ICD-10-CM | POA: Diagnosis not present

## 2018-05-30 DIAGNOSIS — E291 Testicular hypofunction: Secondary | ICD-10-CM | POA: Diagnosis not present

## 2018-06-03 ENCOUNTER — Other Ambulatory Visit: Payer: Self-pay | Admitting: Cardiology

## 2018-06-05 ENCOUNTER — Ambulatory Visit (INDEPENDENT_AMBULATORY_CARE_PROVIDER_SITE_OTHER): Payer: Medicare Other | Admitting: *Deleted

## 2018-06-05 DIAGNOSIS — Z5181 Encounter for therapeutic drug level monitoring: Secondary | ICD-10-CM | POA: Diagnosis not present

## 2018-06-05 DIAGNOSIS — I4819 Other persistent atrial fibrillation: Secondary | ICD-10-CM

## 2018-06-05 LAB — POCT INR: INR: 3 (ref 2.0–3.0)

## 2018-06-05 NOTE — Patient Instructions (Signed)
Description   Continue same dose of 1.5 tablets every day except 1 tablet on Sundays and Thursdays. Recheck INR on 6 weeks.  Call coumadin clinic at [phone] for any concerns.

## 2018-07-21 ENCOUNTER — Ambulatory Visit (INDEPENDENT_AMBULATORY_CARE_PROVIDER_SITE_OTHER): Payer: Medicare Other

## 2018-07-21 DIAGNOSIS — Z5181 Encounter for therapeutic drug level monitoring: Secondary | ICD-10-CM

## 2018-07-21 DIAGNOSIS — I4819 Other persistent atrial fibrillation: Secondary | ICD-10-CM | POA: Diagnosis not present

## 2018-07-21 LAB — POCT INR: INR: 2.6 (ref 2.0–3.0)

## 2018-07-21 NOTE — Patient Instructions (Signed)
Description   Continue same dose of 1.5 tablets every day except 1 tablet on Sundays and Thursdays. Recheck INR on 6 weeks.  Call coumadin clinic at [phone] for any concerns.

## 2018-07-26 ENCOUNTER — Other Ambulatory Visit: Payer: Self-pay | Admitting: Cardiology

## 2018-07-26 ENCOUNTER — Other Ambulatory Visit: Payer: Self-pay | Admitting: Medical

## 2018-08-12 ENCOUNTER — Telehealth: Payer: Self-pay

## 2018-08-12 NOTE — Telephone Encounter (Signed)
Left message on voicemail for patient to call back and schedule AWV.

## 2018-08-14 ENCOUNTER — Other Ambulatory Visit: Payer: Self-pay | Admitting: Cardiology

## 2018-08-14 ENCOUNTER — Other Ambulatory Visit: Payer: Self-pay | Admitting: Medical

## 2018-08-14 NOTE — Telephone Encounter (Signed)
Is this ok to refill?  

## 2018-08-14 NOTE — Telephone Encounter (Signed)
Schedule for 2mo med check or medicare well physical, then refill medications

## 2018-08-14 NOTE — Telephone Encounter (Signed)
Pt said he was not coming in for a medicare well physical he would only come in for an med check

## 2018-08-20 ENCOUNTER — Other Ambulatory Visit: Payer: Self-pay | Admitting: Medical

## 2018-08-22 ENCOUNTER — Encounter: Payer: Self-pay | Admitting: Internal Medicine

## 2018-08-22 ENCOUNTER — Encounter: Payer: Self-pay | Admitting: Medical

## 2018-08-22 ENCOUNTER — Ambulatory Visit (INDEPENDENT_AMBULATORY_CARE_PROVIDER_SITE_OTHER): Payer: Medicare Other | Admitting: Medical

## 2018-08-22 VITALS — BP 120/70 | HR 75 | Temp 97.6°F | Resp 16 | Ht 71.0 in | Wt 278.2 lb

## 2018-08-22 DIAGNOSIS — I10 Essential (primary) hypertension: Secondary | ICD-10-CM | POA: Diagnosis not present

## 2018-08-22 DIAGNOSIS — E559 Vitamin D deficiency, unspecified: Secondary | ICD-10-CM

## 2018-08-22 DIAGNOSIS — I2510 Atherosclerotic heart disease of native coronary artery without angina pectoris: Secondary | ICD-10-CM | POA: Diagnosis not present

## 2018-08-22 DIAGNOSIS — E118 Type 2 diabetes mellitus with unspecified complications: Secondary | ICD-10-CM | POA: Diagnosis not present

## 2018-08-22 DIAGNOSIS — E782 Mixed hyperlipidemia: Secondary | ICD-10-CM | POA: Diagnosis not present

## 2018-08-22 DIAGNOSIS — I5032 Chronic diastolic (congestive) heart failure: Secondary | ICD-10-CM

## 2018-08-22 DIAGNOSIS — E291 Testicular hypofunction: Secondary | ICD-10-CM

## 2018-08-22 DIAGNOSIS — G4733 Obstructive sleep apnea (adult) (pediatric): Secondary | ICD-10-CM | POA: Diagnosis not present

## 2018-08-22 DIAGNOSIS — I4819 Other persistent atrial fibrillation: Secondary | ICD-10-CM | POA: Diagnosis not present

## 2018-08-22 NOTE — Progress Notes (Signed)
Subjective: Chief Complaint  Patient presents with  . med check    med check    Here for med check on diabetes.  Diabetes He is checking his sugars and seeing 120-145 fasting readings.  He has no foot concerns. Exercise - a little, walking some.  Still drives for [employer], so driving most of the town. Diet - in November and December, sitting in the chair a lot as holiday [place], as well as in the car sitting all day He is compliant with Tresiba injection 10 units daily, compliant with metformin XR 500 mg 2 tablets daily, Ozempic injection weekly 0.25 mg.  Has been on B-cise prior.   He has not tolerated Farxiga in the past due to making his heart race.  Hyperlipidemia, coronary artery disease-she is currently taking Lipitor 80 mg 1 tablet daily, and Praluent injection every 2 weeks.  Is also taking Lovaza 2 capsules twice daily, but sometimes misses this due to all of the capsules.   tolerating these okay.  He has not tolerated Crestor and Zetia in the past, felt like it made him crazy  Cardiology manages his heart and BP medications  He has no new concerns today.  Past Medical History:  Diagnosis Date  . Atrial flutter ([place])    a. atypical atrial flutter.  . Bell's palsy   . CAD (coronary artery disease)    a. 07/2009 Cath: LM nl, LAD nl, LCX nl w/ L->R collats, chronically occluded RCA s/p failed PCI;  b. 07/2012 MV: basal inf mild ischemia.  . Central retinal artery occlusion, left eye    diagnosed in 2008  . Cerebrovascular accident [facility])    a. 03/2008 secondary to cardioembolic event;  b. chronic coumadin.  . Chronic diastolic CHF (congestive heart failure) ([place])    a. 12/2008 Echo: nl EF.  . Diabetes mellitus 11/14/2010   TYPE II  . Hematuria 05/2013   Urology, Dr. Estill Dooms  . Hyperlipidemia   . Hypertension   . Hypogonadism, male 05/2013   Dr. Estill Dooms, Urology  . Morbid obesity ([place])   . Obesity   . Obstructive sleep apnea   . Persistent atrial fibrillation    a. s/p afib  ablation by [facility] 08/01/08  . Recommendation refused by patient    multiple recommended vaccines refused over the years (flu/pneumovax)  . Skin infection    chronic left leg herpetic   Current Outpatient Medications on File Prior to Visit  Medication Sig Dispense Refill  . ACCU-CHEK FASTCLIX LANCETS MISC Test blood sugar bid. Dx code E11.9 102 each 1  . Alirocumab (PRALUENT) 75 MG/ML SOPN Inject 1 pen into the skin every 14 (fourteen) days. 2 pen 11  . amLODipine (NORVASC) 5 MG tablet TAKE 1 TABLET BY MOUTH ONCE DAILY 90 tablet 1  . atorvastatin (LIPITOR) 80 MG tablet TAKE 1 TABLET BY MOUTH ONCE DAILY 90 tablet 0  . b complex vitamins capsule Take 1 capsule by mouth every other day.     . Cholecalciferol (VITAMIN D3) 10000 UNITS capsule Take 1 capsule (10,000 Units total) by mouth daily. 4 capsule 0  . glucose blood (ACCU-CHEK AVIVA PLUS) test strip 1 each by Other route 2 (two) times daily. Test blood glucose twice daily. Dx Code E11.9 50 each 10  . insulin degludec (TRESIBA FLEXTOUCH) 100 UNIT/ML SOPN FlexTouch Pen INJECT 10 UNITS INTO THE SKIN DAILY AT 10 PM 3 mL 2  . isosorbide mononitrate (IMDUR) 30 MG 24 hr tablet TAKE 1 TABLET BY MOUTH ONCE  DAILY 90 tablet 1  . losartan (COZAAR) 100 MG tablet TAKE 1 TABLET BY MOUTH ONCE DAILY 90 tablet 3  . metFORMIN (GLUCOPHAGE-XR) 500 MG 24 hr tablet TAKE 2 TABLETS BY MOUTH ONCE DAILY WITH BREAKFAST 180 tablet 0  . metoprolol tartrate (LOPRESSOR) 50 MG tablet TAKE 1 TABLET BY MOUTH TWICE DAILY 180 tablet 1  . omega-3 acid ethyl esters (LOVAZA) 1 g capsule Take 2 capsules (2 g total) by mouth 2 (two) times daily. 360 capsule 3  . OZEMPIC, 0.25 OR 0.5 MG/DOSE, 2 MG/1.5ML SOPN INJECT  0.25 MG SUBCUTANEOUSLY ONCE A WEEK 2 mL 0  . Testosterone (ANDROGEL PUMP) 12.5 MG/ACT (1%) GEL Place 2 application onto the skin daily.     . valACYclovir (VALTREX) 500 MG tablet TAKE 1 TABLET BY MOUTH ONCE DAILY 90 tablet 0  . warfarin (COUMADIN) 5 MG tablet TAKE AS DIRECTED  BY COUMADIN CLINC 50 tablet 2  . albuterol (PROVENTIL HFA;VENTOLIN HFA) 108 (90 Base) MCG/ACT inhaler Inhale 2 puffs into the lungs every 6 (six) hours as needed for wheezing or shortness of breath. (Patient not taking: Reported on 08/22/2018) 1 Inhaler 0   No current facility-administered medications on file prior to visit.    ROS as in subjective    Objective: BP 120/70   Pulse 75   Temp 97.6 F (36.4 C) (Oral)   Resp 16   Ht 5\' 11"  (1.803 m)   Wt 278 lb 3.2 oz (126.2 kg)   SpO2 95%   BMI 38.80 kg/m   Wt Readings from Last 3 Encounters:  08/22/18 278 lb 3.2 oz (126.2 kg)  05/19/18 271 lb (122.9 kg)  02/11/18 268 lb 3.2 oz (121.7 kg)   Of note, prior weights: 12/19/2016 with a weight of 284 pounds 07/28/2015 with a weight of 284 pounds 06/22/2014 with a weight of 269 pounds  Gen: wd, wn, nad Lungs clear Heart rrr, normal s1, s2, no murmurs Ext: no edema Pulses WNL     Diabetic Foot Exam - Simple   Simple Foot Form Diabetic Foot exam was performed with the following findings:  Yes 08/22/2018  4:06 PM  Visual Inspection No deformities, no ulcerations, no other skin breakdown bilaterally:  Yes Sensation Testing Intact to touch and monofilament testing bilaterally:  Yes Pulse Check Posterior Tibialis and Dorsalis pulse intact bilaterally:  Yes Comments      Assessment: Encounter Diagnoses  Name Primary?  . Diabetes mellitus with complication ([place]) Yes  . OSA (obstructive sleep apnea)   . Essential hypertension, benign   . Persistent atrial fibrillation   . Coronary artery disease involving native coronary artery of native heart without angina pectoris   . Chronic diastolic heart failure ([place])   . Mixed dyslipidemia   . Vitamin D deficiency   . Morbid obesity ([place])   . Hypogonadism male       Plan: Diabetes- continue glucose monitoring.  Current medications including Metformin XR 500 mg daily, Ozempic pen weekly added last visit, Tresiba 10 units daily.    Continue daily foot checks, yearly eye doctor follow-up  Obesity- I do not believe he has put forth any serious effort to lose weight.  We even started him on a GLP-1 medication last visit and he has actually gained weight.  Counseled on diet and exercise, strongly encouraged him to get a fit bit and to try to set an initial goal of 5000 steps daily and work-up from there  I reviewed his cardiology note with Dr. Radford Pax  from 01/28/2018 and her notes showed the following: Hypertension-he was continued on Lopressor 50mg  twice daily,Losartan 100 mg daily, and Amlodipine was decreased to 5 mg due to some low blood pressure readings  Chronic CHF- his weight was stable was at that visit and was not requiring diuretics.  He was also compliant with CPAP.  Regarding atherosclerotic heart disease was continued on Imdur daily beta-blocker, Praluent and statin.  Persistent atrial fibrillation- at that time was normal sinus rhythm, converted spontaneously in the emergency department prior to that visit, continued on beta-blocker and warfarin.    Hyperlipidemia-Continued on atorvastatin 80 mg daily and Praluent   He will return next week for fasting labs as he was nonfasting today  He continues to decline vaccines   [name] was seen today for med check.  Diagnoses and all orders for this visit:  Diabetes mellitus with complication ([place]) -     Comprehensive metabolic panel; Future -     CBC; Future -     Hemoglobin A1c; Future -     HM DIABETES EYE EXAM -     HM DIABETES FOOT EXAM -     Microalbumin / creatinine urine ratio; Future -     Lipid panel; Future  OSA (obstructive sleep apnea)  Essential hypertension, benign -     Comprehensive metabolic panel; Future -     CBC; Future  Persistent atrial fibrillation -     Comprehensive metabolic panel; Future -     CBC; Future  Coronary artery disease involving native coronary artery of native heart without angina pectoris  Chronic diastolic heart  failure ([facility])  Mixed dyslipidemia -     Lipid panel; Future  Vitamin D deficiency  Morbid obesity ([place])  Hypogonadism male

## 2018-08-25 ENCOUNTER — Other Ambulatory Visit: Payer: Medicare Other

## 2018-08-25 ENCOUNTER — Encounter: Payer: Self-pay | Admitting: Medical

## 2018-08-25 DIAGNOSIS — E118 Type 2 diabetes mellitus with unspecified complications: Secondary | ICD-10-CM | POA: Diagnosis not present

## 2018-08-25 DIAGNOSIS — I10 Essential (primary) hypertension: Secondary | ICD-10-CM | POA: Diagnosis not present

## 2018-08-25 DIAGNOSIS — E782 Mixed hyperlipidemia: Secondary | ICD-10-CM | POA: Diagnosis not present

## 2018-08-25 DIAGNOSIS — I4819 Other persistent atrial fibrillation: Secondary | ICD-10-CM

## 2018-08-26 ENCOUNTER — Other Ambulatory Visit: Payer: Self-pay | Admitting: Medical

## 2018-08-26 LAB — COMPREHENSIVE METABOLIC PANEL
ALT: 20 IU/L (ref 0–44)
AST: 16 IU/L (ref 0–40)
Albumin/Globulin Ratio: 1.8 (ref 1.2–2.2)
Albumin: 4.4 g/dL (ref 3.8–4.8)
Alkaline Phosphatase: 89 IU/L (ref 39–117)
BUN/Creatinine Ratio: 14 (ref 10–24)
BUN: 13 mg/dL (ref 8–27)
Bilirubin Total: 0.3 mg/dL (ref 0.0–1.2)
CO2: 23 mmol/L (ref 20–29)
Calcium: 9.3 mg/dL (ref 8.6–10.2)
Chloride: 102 mmol/L (ref 96–106)
Creatinine, Ser: 0.95 mg/dL (ref 0.76–1.27)
GFR calc Af Amer: 95 mL/min/{1.73_m2} (ref >59)
GFR calc non Af Amer: 82 mL/min/{1.73_m2} (ref >59)
Globulin, Total: 2.5 g/dL (ref 1.5–4.5)
Glucose: 141 mg/dL — ABNORMAL HIGH (ref 65–99)
Potassium: 5.1 mmol/L (ref 3.5–5.2)
Sodium: 139 mmol/L (ref 134–144)
Total Protein: 6.9 g/dL (ref 6.0–8.5)

## 2018-08-26 LAB — CBC
Hematocrit: 43.4 % (ref 37.5–51.0)
Hemoglobin: 13.9 g/dL (ref 13.0–17.7)
MCH: 25.7 pg — ABNORMAL LOW (ref 26.6–33.0)
MCHC: 32 g/dL (ref 31.5–35.7)
MCV: 80 fL (ref 79–97)
Platelets: 272 10*3/uL (ref 150–450)
RBC: 5.4 x10E6/uL (ref 4.14–5.80)
RDW: 15.2 % (ref 11.6–15.4)
WBC: 7.1 10*3/uL (ref 3.4–10.8)

## 2018-08-26 LAB — LIPID PANEL
Chol/HDL Ratio: 2.8 ratio (ref 0.0–5.0)
Cholesterol, Total: 119 mg/dL (ref 100–199)
HDL: 42 mg/dL (ref >39)
LDL Calculated: 39 mg/dL (ref 0–99)
Triglycerides: 189 mg/dL — ABNORMAL HIGH (ref 0–149)
VLDL Cholesterol Cal: 38 mg/dL (ref 5–40)

## 2018-08-26 LAB — HEMOGLOBIN A1C
Est. average glucose Bld gHb Est-mCnc: 157 mg/dL
Hgb A1c MFr Bld: 7.1 % — ABNORMAL HIGH (ref 4.8–5.6)

## 2018-08-26 LAB — MICROALBUMIN / CREATININE URINE RATIO
Creatinine, Urine: 135.7 mg/dL
Microalb/Creat Ratio: 51 mg/g creat — ABNORMAL HIGH (ref 0–29)
Microalbumin, Urine: 69.3 ug/mL

## 2018-08-26 MED ORDER — INSULIN PEN NEEDLE 32G X 4 MM MISC: 1.0000 | Freq: Every day | 11 refills | Status: DC

## 2018-08-26 MED ORDER — INSULIN DEGLUDEC 100 UNIT/ML ~~LOC~~ SOPN: PEN_INJECTOR | SUBCUTANEOUS | 11 refills | Status: DC

## 2018-08-26 MED ORDER — SEMAGLUTIDE (1 MG/DOSE) 2 MG/1.5ML ~~LOC~~ SOPN: 1.0000 mg | PEN_INJECTOR | SUBCUTANEOUS | 11 refills | Status: DC

## 2018-08-26 MED ORDER — OMEGA-3-ACID ETHYL ESTERS 1 G PO CAPS: 2.0000 | ORAL_CAPSULE | Freq: Two times a day (BID) | ORAL | 3 refills | Status: DC

## 2018-09-01 ENCOUNTER — Ambulatory Visit (INDEPENDENT_AMBULATORY_CARE_PROVIDER_SITE_OTHER): Payer: Medicare Other | Admitting: Pharmacist

## 2018-09-01 DIAGNOSIS — Z5181 Encounter for therapeutic drug level monitoring: Secondary | ICD-10-CM | POA: Diagnosis not present

## 2018-09-01 DIAGNOSIS — I4819 Other persistent atrial fibrillation: Secondary | ICD-10-CM

## 2018-09-01 LAB — POCT INR: INR: 2.3 (ref 2.0–3.0)

## 2018-09-01 NOTE — Patient Instructions (Signed)
Continue same dose of 1.5 tablets every day except 1 tablet on Sundays and Thursdays. Recheck INR on 6 weeks.  Call coumadin clinic at [phone] for any concerns.

## 2018-09-22 ENCOUNTER — Ambulatory Visit (INDEPENDENT_AMBULATORY_CARE_PROVIDER_SITE_OTHER): Payer: Medicare Other | Admitting: Cardiology

## 2018-09-22 ENCOUNTER — Encounter: Payer: Self-pay | Admitting: Cardiology

## 2018-09-22 VITALS — BP 128/66 | HR 74 | Ht 71.0 in | Wt 276.0 lb

## 2018-09-22 DIAGNOSIS — I10 Essential (primary) hypertension: Secondary | ICD-10-CM | POA: Diagnosis not present

## 2018-09-22 DIAGNOSIS — I4819 Other persistent atrial fibrillation: Secondary | ICD-10-CM | POA: Diagnosis not present

## 2018-09-22 DIAGNOSIS — I2510 Atherosclerotic heart disease of native coronary artery without angina pectoris: Secondary | ICD-10-CM | POA: Diagnosis not present

## 2018-09-22 DIAGNOSIS — I5032 Chronic diastolic (congestive) heart failure: Secondary | ICD-10-CM | POA: Diagnosis not present

## 2018-09-22 DIAGNOSIS — G4733 Obstructive sleep apnea (adult) (pediatric): Secondary | ICD-10-CM | POA: Diagnosis not present

## 2018-09-22 NOTE — Patient Instructions (Signed)
Medication Instructions:  Your physician recommends that you continue on your current medications as directed. Please refer to the Current Medication list given to you today.  If you need a refill on your cardiac medications before your next appointment, please call your pharmacy.   Lab work: None If you have labs (blood work) drawn today and your tests are completely normal, you will receive your results only by: . MyChart Message (if you have MyChart) OR . A paper copy in the mail If you have any lab test that is abnormal or we need to change your treatment, we will call you to review the results.  Testing/Procedures: None  Follow-Up: At [facility], you and your health needs are our priority.  As part of our continuing mission to provide you with exceptional heart care, we have created designated Provider Care Teams.  These Care Teams include your primary Cardiologist (physician) and Advanced Practice Providers (APPs -  Physician Assistants and Nurse Practitioners) who all work together to provide you with the care you need, when you need it. You will need a follow up appointment in 1 years.  Please call our office 2 months in advance to schedule this appointment.  You may see Traci Turner, MD or one of the following Advanced Practice Providers on your designated Care Team:   Brittainy Simmons, PA-C Dayna Dunn, PA-C . Michele Lenze, PA-C   

## 2018-09-22 NOTE — Progress Notes (Signed)
Cardiology Office Note:    Date:  09/22/2018   ID:  [name], DOB 14-Dec-1950, MRN [id number]  PCP:  Carlena Hurl, PA-C  Cardiologist:  Fransico Him, MD    Referring MD: Caryl Ada  [facility] Chief Complaint  Patient presents with  . Coronary Artery Disease  . Hypertension  . Atrial Fibrillation  . Sleep Apnea    History of Present Illness:    [name] is a 68 y.o. male with a hx of ASCADwith chronically occluded RCA s/p failed PCI with L>>R collaterals, HTN, OSAon CPAP, morbid obesity,persistentAFs/p afib ablation, atrial flutter unable to ablate at EP studyanddyslipidemia. He is here today for followup.  He recently underwent a penile prosthesis which was complicated by a massive hematoma from bridging with Lovenox.  His Urologist has cleared him to go back on coumadin load without lovenox bridge.  He recently has noticed episodes of palpitations with his fitbit showing HRs as high as 180;s.  He went to the ER this past week and was noted to be in afib with RVR but spontaneously converted to NSR.    He is here today for followup and is doing well.  He denies any chest pain or pressure, SOB, DOE, PND, orthopnea, LE edema, dizziness, palpitations or syncope. He is compliant with his meds and is tolerating meds with no SE.  He is doing well with his CPAP device and thinks that he has gotten used to it.  He tolerates the mask and feels the pressure is adequate.  Since going on CPAP he feels rested in the am and has no significant daytime sleepiness.  He denies any significant mouth or nasal dryness or nasal congestion.  h does not think that he snores.     Past Medical History:  Diagnosis Date  . Atrial flutter ([place])    a. atypical atrial flutter.  . Bell's palsy   . CAD (coronary artery disease)    a. 07/2009 Cath: LM nl, LAD nl, LCX nl w/ L->R collats, chronically occluded RCA s/p failed PCI;  b. 07/2012 MV: basal inf mild ischemia.  . Central retinal artery  occlusion, left eye    diagnosed in 2008  . Cerebrovascular accident [facility])    a. 03/2008 secondary to cardioembolic event;  b. chronic coumadin.  . Chronic diastolic CHF (congestive heart failure) ([place])    a. 12/2008 Echo: nl EF.  . Diabetes mellitus 11/14/2010   TYPE II  . Hematuria 05/2013   Urology, Dr. Estill Dooms  . Hyperlipidemia   . Hypertension   . Hypogonadism, male 05/2013   Dr. Estill Dooms, Urology  . Morbid obesity ([place])   . Obesity   . Obstructive sleep apnea   . Persistent atrial fibrillation    a. s/p afib ablation by [facility] 08/01/08  . Recommendation refused by patient    multiple recommended vaccines refused over the years (flu/pneumovax)  . Skin infection    chronic left leg herpetic    Past Surgical History:  Procedure Laterality Date  . ATRIAL ABLATION SURGERY  08/01/08   CTI and PVI ablation by [facility]  . COLONOSCOPY     2012 per patient  . ELECTROPHYSIOLOGIC STUDY N/A 11/30/2014   Procedure: Atrial Fibrillation Ablation;  Surgeon: [name], MD;  Location: [place] [facility];  Service: Cardiovascular;  Laterality: N/A;  . TEE WITHOUT CARDIOVERSION N/A 11/30/2014   Procedure: TRANSESOPHAGEAL ECHOCARDIOGRAM (TEE);  Surgeon: Josue Hector, MD;  Location: [place];  Service: Cardiovascular;  Laterality: N/A;  Current Medications: Current Meds  Medication Sig  . ACCU-CHEK FASTCLIX LANCETS MISC Test blood sugar bid. Dx code E11.9  . albuterol (PROVENTIL HFA;VENTOLIN HFA) 108 (90 Base) MCG/ACT inhaler Inhale 2 puffs into the lungs every 6 (six) hours as needed for wheezing or shortness of breath.  . Alirocumab (PRALUENT) 75 MG/ML SOPN Inject 1 pen into the skin every 14 (fourteen) days.  [name] amLODipine (NORVASC) 5 MG tablet TAKE 1 TABLET BY MOUTH ONCE DAILY  . atorvastatin (LIPITOR) 80 MG tablet TAKE 1 TABLET BY MOUTH ONCE DAILY  . b complex vitamins capsule Take 1 capsule by mouth every other day.   . Cholecalciferol (VITAMIN D3) 10000 UNITS capsule Take 1 capsule (10,000  Units total) by mouth daily.  [name] glucose blood (ACCU-CHEK AVIVA PLUS) test strip 1 each by Other route 2 (two) times daily. Test blood glucose twice daily. Dx Code E11.9  . insulin degludec (TRESIBA FLEXTOUCH) 100 UNIT/ML SOPN FlexTouch Pen INJECT 10 UNITS INTO THE SKIN DAILY AT 10 PM  . Insulin Pen Needle (BD PEN NEEDLE NANO U/F) 32G X 4 MM MISC 1 each by Does not apply route at bedtime.  . isosorbide mononitrate (IMDUR) 30 MG 24 hr tablet TAKE 1 TABLET BY MOUTH ONCE DAILY  . losartan (COZAAR) 100 MG tablet TAKE 1 TABLET BY MOUTH ONCE DAILY  . metFORMIN (GLUCOPHAGE-XR) 500 MG 24 hr tablet TAKE 2 TABLETS BY MOUTH ONCE DAILY WITH BREAKFAST  . metoprolol tartrate (LOPRESSOR) 50 MG tablet TAKE 1 TABLET BY MOUTH TWICE DAILY  . omega-3 acid ethyl esters (LOVAZA) 1 g capsule Take 2 capsules (2 g total) by mouth 2 (two) times daily.  . Semaglutide, 1 MG/DOSE, (OZEMPIC, 1 MG/DOSE,) 2 MG/1.5ML SOPN Inject 1 mg into the skin once a week.  . Testosterone (ANDROGEL PUMP) 12.5 MG/ACT (1%) GEL Place 2 application onto the skin daily.   . valACYclovir (VALTREX) 500 MG tablet TAKE 1 TABLET BY MOUTH ONCE DAILY  . warfarin (COUMADIN) 5 MG tablet TAKE AS DIRECTED BY COUMADIN [facility]     Allergies:   Crestor [rosuvastatin]; Zetia [ezetimibe]; Lisinopril; and [place] [dapagliflozin]   Social History   Socioeconomic History  . Marital status: Married    Spouse name: Not on file  . Number of children: Not on file  . Years of education: Not on file  . Highest education level: Not on file  Occupational History  . Not on file  Social Needs  . Financial resource strain: Not on file  . Food insecurity:    Worry: Not on file    Inability: Not on file  . Transportation needs:    Medical: Not on file    Non-medical: Not on file  Tobacco Use  . Smoking status: Former Smoker    Last attempt to quit: 07/24/1979    Years since quitting: 39.1  . Smokeless tobacco: Never Used  Substance and Sexual Activity  . Alcohol  use: No    Comment: former  . Drug use: No    Comment: former  . Sexual activity: Not on file  Lifestyle  . Physical activity:    Days per week: Not on file    Minutes per session: Not on file  . Stress: Not on file  Relationships  . Social connections:    Talks on phone: Not on file    Gets together: Not on file    Attends religious service: Not on file    Active member of club or organization: Not on file  Attends meetings of clubs or organizations: Not on file    Relationship status: Not on file  Other Topics Concern  . Not on file  Social History Narrative   Lives in [place], [place]. With his spouse and son. Has remote history of tobacco, but quit 30 years ago. Has heavy history of alcohol consumption but quit 30 years ago. Previously used marijuana and cocaine, but denies use over the past 30 years. 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      Family History: The patient's family history includes Heart disease in his father and mother; Stroke in his father.  ROS:  Please see the history of present illness.    ROS  All other systems reviewed and negative.   EKGs/Labs/Other Studies Reviewed:    The following studies were reviewed today: PAP download  EKG:  EKG is not ordered today.    Recent Labs: 08/25/2018: ALT 20; BUN 13; Creatinine, Ser 0.95; Hemoglobin 13.9; Platelets 272; Potassium 5.1; Sodium 139   Recent Lipid Panel    Component Value Date/Time   CHOL 119 08/25/2018 0900   TRIG 189 (H) 08/25/2018 0900   HDL 42 08/25/2018 0900   CHOLHDL 2.8 08/25/2018 0900   CHOLHDL 3.3 07/30/2017 1050   VLDL 44 (H) 09/06/2016 1020   LDLCALC 39 08/25/2018 0900   LDLCALC 65 07/30/2017 1050   LDLDIRECT 87.7 05/08/2013 0837    Physical Exam:    VS:  BP 128/66   Pulse 74   Ht 5\' 11"  (1.803 m)   Wt 276 lb (125.2 kg)   SpO2 96%   BMI 38.49 kg/m     Wt Readings from Last 3 Encounters:  09/22/18 276 lb (125.2 kg)  08/22/18 278 lb 3.2 oz (126.2 kg)    05/19/18 271 lb (122.9 kg)     GEN:  Well nourished, well developed in no acute distress HEENT: Normal NECK: No JVD; No carotid bruits LYMPHATICS: No lymphadenopathy CARDIAC: RRR, no murmurs, rubs, gallops RESPIRATORY:  Clear to auscultation without rales, wheezing or rhonchi  ABDOMEN: Soft, non-tender, non-distended MUSCULOSKELETAL:  No edema; No deformity  SKIN: Warm and dry NEUROLOGIC:  Alert and oriented x 3 PSYCHIATRIC:  Normal affect   ASSESSMENT:    1. Persistent atrial fibrillation   2. Essential hypertension, benign   3. Coronary artery disease involving native coronary artery of native heart without angina pectoris   4. Chronic diastolic heart failure ([place])   5. OSA (obstructive sleep apnea)   6. Morbid obesity ([place])    PLAN:    In order of problems listed above:  1.  Persistent atrial fibrillation -he is maintaining normal sinus rhythm on exam.  He will continue on pressure 50 mg twice daily and warfarin.  He has not had any bleeding problems.  His hemoglobin was stable at 11.6 last summer.  2.  Hypertension -BP is well controlled on exam today.  He will continue on amlodipine 5 mg daily, losartan 100 mg daily and Lopressor 50 mg twice daily.  Creatinine was stable at 0.95 and potassium 5.1 on 08/25/2018.  3.  ASCAD -  chronically occluded RCA s/p failed PCI with L>>R collaterals.  Not had any anginal symptoms.  He is not on aspirin due to warfarin.  He will continue on beta-blocker, long-acting nitrate and statin.  4.  Chronic diastolic CHF - he appears euvolemic on exam today.  His weight is stable.  He has not required any diuretic therapy recently.  5. OSA - the patient is  tolerating PAP therapy well without any problems. The PAP download was reviewed today and showed an AHI of 0.3/hr on 16 cm H2O with 100% compliance in using more than 4 hours nightly.  The patient has been using and benefiting from PAP use and will continue to benefit from therapy.   6.  Morbid  Obesity - I have encouraged him to get into a routine exercise program and cut back on carbs and portions.   7.  Hyperlipidemia -LDL goal is less than 70.  His LDL was at goal at 39 and ALT normal at 20 on 08/25/2018.  He will continue on atorvastatin 80 mg daily.   Medication Adjustments/Labs and Tests Ordered: Current medicines are reviewed at length with the patient today.  Concerns regarding medicines are outlined above.  No orders of the defined types were placed in this encounter.  No orders of the defined types were placed in this encounter.   Signed, Fransico Him, MD  09/22/2018 9:18 AM    [place]

## 2018-10-10 ENCOUNTER — Telehealth: Payer: Self-pay

## 2018-10-10 ENCOUNTER — Telehealth: Payer: Self-pay | Admitting: *Deleted

## 2018-10-10 NOTE — Telephone Encounter (Signed)
lmom for prescreen  

## 2018-10-10 NOTE — Telephone Encounter (Signed)

## 2018-10-13 ENCOUNTER — Ambulatory Visit (INDEPENDENT_AMBULATORY_CARE_PROVIDER_SITE_OTHER): Payer: Medicare Other | Admitting: Pharmacist

## 2018-10-13 ENCOUNTER — Other Ambulatory Visit: Payer: Self-pay

## 2018-10-13 DIAGNOSIS — Z5181 Encounter for therapeutic drug level monitoring: Secondary | ICD-10-CM

## 2018-10-13 DIAGNOSIS — I4819 Other persistent atrial fibrillation: Secondary | ICD-10-CM

## 2018-10-13 LAB — POCT INR: INR: 2.8 (ref 2.0–3.0)

## 2018-10-13 MED ORDER — APIXABAN 5 MG PO TABS: 5.0000 mg | ORAL_TABLET | Freq: Two times a day (BID) | ORAL | 5 refills | Status: DC

## 2018-10-13 NOTE — Patient Instructions (Signed)
Description   Pt already took warfarin today. Will not take warfarin tomorrow, then start Eliquis 5mg  twice daily on Wednesday, 3/25.

## 2018-10-20 ENCOUNTER — Other Ambulatory Visit: Payer: Self-pay | Admitting: Medical

## 2018-10-22 ENCOUNTER — Other Ambulatory Visit: Payer: Self-pay | Admitting: Medical

## 2018-11-11 ENCOUNTER — Encounter: Payer: Self-pay | Admitting: Medical

## 2018-11-13 ENCOUNTER — Telehealth: Payer: Self-pay | Admitting: Medical

## 2018-11-13 NOTE — Telephone Encounter (Signed)
Left message on voicemail for patient to call back to schedule virtual AWV and DM follow up in May 2020.

## 2018-11-13 NOTE — Telephone Encounter (Signed)
Patient scheduled for Dec 11 2018 for AWV and DM check.

## 2018-11-13 NOTE — Telephone Encounter (Signed)
Lets set up for May for 29min appt virtual for diabetes and medicare annual wellness.  Medicare allows a yearly annual wellness visit as a chart review with recommendations and care plan.   I would like him to do both in May, so please schedule.

## 2018-12-07 ENCOUNTER — Other Ambulatory Visit: Payer: Self-pay | Admitting: Cardiology

## 2018-12-07 ENCOUNTER — Other Ambulatory Visit: Payer: Self-pay | Admitting: Medical

## 2018-12-11 ENCOUNTER — Ambulatory Visit (INDEPENDENT_AMBULATORY_CARE_PROVIDER_SITE_OTHER): Payer: Medicare Other | Admitting: Medical

## 2018-12-11 ENCOUNTER — Other Ambulatory Visit: Payer: Self-pay

## 2018-12-11 DIAGNOSIS — G4733 Obstructive sleep apnea (adult) (pediatric): Secondary | ICD-10-CM | POA: Diagnosis not present

## 2018-12-11 DIAGNOSIS — E118 Type 2 diabetes mellitus with unspecified complications: Secondary | ICD-10-CM

## 2018-12-11 DIAGNOSIS — I4819 Other persistent atrial fibrillation: Secondary | ICD-10-CM

## 2018-12-11 DIAGNOSIS — E782 Mixed hyperlipidemia: Secondary | ICD-10-CM

## 2018-12-11 DIAGNOSIS — I10 Essential (primary) hypertension: Secondary | ICD-10-CM

## 2018-12-11 DIAGNOSIS — E559 Vitamin D deficiency, unspecified: Secondary | ICD-10-CM

## 2018-12-11 DIAGNOSIS — I5032 Chronic diastolic (congestive) heart failure: Secondary | ICD-10-CM | POA: Diagnosis not present

## 2018-12-11 DIAGNOSIS — N529 Male erectile dysfunction, unspecified: Secondary | ICD-10-CM

## 2018-12-11 DIAGNOSIS — E291 Testicular hypofunction: Secondary | ICD-10-CM

## 2018-12-11 DIAGNOSIS — Z0000 Encounter for general adult medical examination without abnormal findings: Secondary | ICD-10-CM | POA: Diagnosis not present

## 2018-12-11 DIAGNOSIS — H9190 Unspecified hearing loss, unspecified ear: Secondary | ICD-10-CM

## 2018-12-11 DIAGNOSIS — Z7189 Other specified counseling: Secondary | ICD-10-CM

## 2018-12-11 DIAGNOSIS — Z2820 Immunization not carried out because of patient decision for unspecified reason: Secondary | ICD-10-CM | POA: Diagnosis not present

## 2018-12-11 DIAGNOSIS — D126 Benign neoplasm of colon, unspecified: Secondary | ICD-10-CM

## 2018-12-11 DIAGNOSIS — I483 Typical atrial flutter: Secondary | ICD-10-CM

## 2018-12-11 DIAGNOSIS — I2510 Atherosclerotic heart disease of native coronary artery without angina pectoris: Secondary | ICD-10-CM

## 2018-12-11 NOTE — Progress Notes (Addendum)
Subjective:    [name] is a 68 y.o. male who presents for Preventative Services visit and chronic medical problems/med check visit.    This visit type was conducted due to national recommendations for restrictions regarding the COVID-19 Pandemic (e.g. social distancing) in an effort to limit this patient's exposure and mitigate transmission in our community.  Due to their co-morbid illnesses, this patient is at least at moderate risk for complications without adequate follow up.  This format is felt to be most appropriate for this patient at this time.    Documentation for virtual audio and video telecommunications through Zoom encounter:  The patient was located at home. The provider was located in the office. The patient did consent to this visit and is aware of possible charges through their insurance for this visit.  The other persons participating in this telemedicine service were none. Time spent on call was 45  minutes and in review of previous records >45 minutes total.  This virtual service is not related to other E/M service within previous 7 days.   Primary Care Provider Tysinger, Camelia Eng, PA-C here for primary care  Current Health Care Team:  Dentist, Dr. Lem[facility]   Eye doctor, Dr. Quay Burow  Cardiology, Dr. Fransico Him  Electrophysiology   Urology, Dr. Denyce Robert  Medical Services you may have received from other than [facility] providers in the past year (date may be approximate) [facility]rology at [facility]   Exercise Current exercise habits: none    Nutrition/Diet Current diet: normal  Depression Screen Depression screen [facility] 2/9 12/11/2018  Decreased Interest 0  Down, Depressed, Hopeless 0  PHQ - 2 Score 0  Some recent data might be hidden    Activities of Daily Living Screen/Functional Status Survey Is the patient deaf or have difficulty hearing?: Yes Does the patient have difficulty seeing, even when wearing glasses/contacts?: No Does the  patient have difficulty concentrating, remembering, or making decisions?: No Does the patient have difficulty walking or climbing stairs?: No Does the patient have difficulty dressing or bathing?: No Does the patient have difficulty doing errands alone such as visiting a doctor's office or shopping?: No  Can patient draw a clock face showing 3:15 o'clock, yes   Fall Risk Screen Fall Risk  12/11/2018 08/22/2018 02/11/2018 12/19/2016 11/19/2013  Falls in the past year? 0 0 No No No  Follow up Falls evaluation completed - - - -    Gait Assessment: Normal gait observed yes  Advanced directives Does patient have a [place]? No  Does patient have a Living Will? No   Past Medical History:  Diagnosis Date   Atrial flutter ([place])    a. atypical atrial flutter.   Bell's palsy    CAD (coronary artery disease)    a. 07/2009 Cath: LM nl, LAD nl, LCX nl w/ L->R collats, chronically occluded RCA s/p failed PCI;  b. 07/2012 MV: basal inf mild ischemia.   Central retinal artery occlusion, left eye    diagnosed in 2008   Cerebrovascular accident [facility])    a. 03/2008 secondary to cardioembolic event;  b. chronic coumadin.   Chronic diastolic CHF (congestive heart failure) ([place])    a. 12/2008 Echo: nl EF.   Diabetes mellitus 11/14/2010   TYPE II   Hematuria 05/2013   Urology, Dr. Estill Dooms   Hyperlipidemia    Hypertension    Hypogonadism, male 05/2013   Dr. Estill Dooms, Urology   Morbid obesity [facility])    Obesity  Obstructive sleep apnea    Persistent atrial fibrillation    a. s/p afib ablation by [facility] 08/01/08   Recommendation refused by patient    multiple recommended vaccines refused over the years (flu/pneumovax)   Skin infection    chronic left leg herpetic    Past Surgical History:  Procedure Laterality Date   ATRIAL ABLATION SURGERY  08/01/08   CTI and PVI ablation by [facility]   COLONOSCOPY     2012 per patient   ELECTROPHYSIOLOGIC STUDY N/A 11/30/2014    Procedure: Atrial Fibrillation Ablation;  Surgeon: [name], MD;  Location: [facility];  Service: Cardiovascular;  Laterality: N/A;   TEE WITHOUT CARDIOVERSION N/A 11/30/2014   Procedure: TRANSESOPHAGEAL ECHOCARDIOGRAM (TEE);  Surgeon: Josue Hector, MD;  Location: [facility] ENDOSCOPY;  Service: Cardiovascular;  Laterality: N/A;    Social History   Socioeconomic History   Marital status: Married    Spouse name: Not on file   Number of children: Not on file   Years of education: Not on file   Highest education level: Not on file  Occupational History   Not on file  Social Needs   Financial resource strain: Not on file   Food insecurity:    Worry: Not on file    Inability: Not on file   Transportation needs:    Medical: Not on file    Non-medical: Not on file  Tobacco Use   Smoking status: Former Smoker    Last attempt to quit: 07/24/1979    Years since quitting: 39.4   Smokeless tobacco: Never Used  Substance and Sexual Activity   Alcohol use: No    Comment: former   Drug use: No    Comment: former   Sexual activity: Not on file  Lifestyle   Physical activity:    Days per week: Not on file    Minutes per session: Not on file   Stress: Not on file  Relationships   Social connections:    Talks on phone: Not on file    Gets together: Not on file    Attends religious service: Not on file    Active member of club or organization: Not on file    Attends meetings of clubs or organizations: Not on file    Relationship status: Not on file   Intimate partner violence:    Fear of current or ex partner: Not on file    Emotionally abused: Not on file    Physically abused: Not on file    Forced sexual activity: Not on file  Other Topics Concern   Not on file  Social History Narrative   Lives in [place], [place]. With his spouse and son. Has remote history of tobacco, but quit 30 years ago. Has heavy history of alcohol consumption but quit 30 years ago.  Previously used marijuana and cocaine, but denies use over the past 30 years. 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     Family History  Problem Relation Age of Onset   Stroke Father    Heart disease Father    Heart disease Mother      Current Outpatient Medications:    ACCU-CHEK FASTCLIX LANCETS MISC, Test blood sugar bid. Dx code E11.9, Disp: 102 each, Rfl: 1   Alirocumab (PRALUENT) 75 MG/ML SOPN, Inject 1 pen into the skin every 14 (fourteen) days., Disp: 2 pen, Rfl: 11   amLODipine (NORVASC) 5 MG tablet, TAKE 1 TABLET BY MOUTH ONCE DAILY, Disp: 90 tablet, Rfl:  1   apixaban (ELIQUIS) 5 MG TABS tablet, Take 1 tablet (5 mg total) by mouth 2 (two) times daily., Disp: 60 tablet, Rfl: 5   atorvastatin (LIPITOR) 80 MG tablet, Take 1 tablet by mouth once daily, Disp: 90 tablet, Rfl: 0   b complex vitamins capsule, Take 1 capsule by mouth every other day. , Disp: , Rfl:    Cholecalciferol (VITAMIN D3) 10000 UNITS capsule, Take 1 capsule (10,000 Units total) by mouth daily., Disp: 4 capsule, Rfl: 0   glucose blood (ACCU-CHEK AVIVA PLUS) test strip, 1 each by Other route 2 (two) times daily. Test blood glucose twice daily. Dx Code E11.9, Disp: 50 each, Rfl: 10   Insulin Pen Needle (BD PEN NEEDLE NANO U/F) 32G X 4 MM MISC, 1 each by Does not apply route at bedtime., Disp: 100 each, Rfl: 11   isosorbide mononitrate (IMDUR) 30 MG 24 hr tablet, TAKE 1 TABLET BY MOUTH ONCE DAILY, Disp: 90 tablet, Rfl: 1   losartan (COZAAR) 100 MG tablet, Take 1 tablet by mouth once daily, Disp: 90 tablet, Rfl: 2   metFORMIN (GLUCOPHAGE-XR) 500 MG 24 hr tablet, TAKE 2 TABLETS BY MOUTH ONCE DAILY WITH BREAKFAST, Disp: 180 tablet, Rfl: 0   metoprolol tartrate (LOPRESSOR) 50 MG tablet, TAKE 1 TABLET BY MOUTH TWICE DAILY, Disp: 180 tablet, Rfl: 1   omega-3 acid ethyl esters (LOVAZA) 1 g capsule, Take 2 capsules (2 g total) by mouth 2 (two) times daily., Disp: 360 capsule, Rfl: 3   Semaglutide, 1  MG/DOSE, (OZEMPIC, 1 MG/DOSE,) 2 MG/1.5ML SOPN, Inject 1 mg into the skin once a week., Disp: 1.5 mL, Rfl: 11   Testosterone (ANDROGEL PUMP) 12.5 MG/ACT (1%) GEL, Place 2 application onto the skin daily. , Disp: , Rfl:    TRESIBA FLEXTOUCH 100 UNIT/ML SOPN FlexTouch Pen, INJECT 10 UNITS SUBCUTANEOUSLY ONCE DAILY AT  10  PM  AS  DIRECTED, Disp: 15 mL, Rfl: 2   valACYclovir (VALTREX) 500 MG tablet, Take 1 tablet by mouth once daily, Disp: 90 tablet, Rfl: 0   albuterol (PROVENTIL HFA;VENTOLIN HFA) 108 (90 Base) MCG/ACT inhaler, Inhale 2 puffs into the lungs every 6 (six) hours as needed for wheezing or shortness of breath. (Patient not taking: Reported on 12/11/2018), Disp: 1 Inhaler, Rfl: 0  Allergies  Allergen Reactions   Crestor [Rosuvastatin] Other (See Comments)    Made him feel crazy   Zetia [Ezetimibe] Other (See Comments)    Made him feel crazy   Lisinopril Cough   Farxiga [Dapagliflozin]     Made heart race    History reviewed: allergies, current medications, past family history, past medical history, past social history, past surgical history and problem list  Chronic issues discussed: Diabetes-checking sugars, running less than 130 fasting, trying to eat relatively healthy, getting some walking in.  Trying to stay away from the pelvic given his risk factors and risk for coronavirus  A. fib, recently was changed off of Coumadin and put on Liquids which he is compliant with  He is compliant with his statin and Praluent  Acute issues discussed: none  Review of Systems Constitutional: -fever, -chills, -sweats, -unexpected weight change,-fatigue ENT: -runny nose, -ear pain, -sore throat Cardiology:  -chest pain, -palpitations, -edema Respiratory: -cough, -shortness of breath, -wheezing Gastroenterology: -abdominal pain, -nausea, -vomiting, -diarrhea, -constipation Hematology: -bleeding or bruising problems Musculoskeletal: -arthralgias, -myalgias, -joint swelling, -back  pain Ophthalmology: -vision changes Urology: -dysuria, -difficulty urinating, -hematuria, -urinary frequency, -urgency Neurology: -headache, -weakness, -tingling, -numbness  Objective:     Not examined due to being at home for the stay at home measures for COVID-19 Well-developed, well-nourished, no acute distress  Cognitive Testing  Alert? Yes  Normal Appearance?Yes  Oriented to person? Yes  Place? Yes   Time? Yes  Recall of three objects?  Yes  Can perform simple calculations? Yes  Displays appropriate judgment?Yes  Can read the correct time from a watch face?Yes  Assessment:   Encounter Diagnoses  Name Primary?   Typical atrial flutter (HCC) Yes   Persistent atrial fibrillation    Coronary artery disease involving native coronary artery of native heart without angina pectoris    Chronic diastolic heart failure (HCC)    Essential hypertension, benign    OSA (obstructive sleep apnea)    Tubular adenoma of colon    Diabetes mellitus with complication ([place])    Hypogonadism male    Hearing loss, unspecified hearing loss type, unspecified laterality    Vitamin D deficiency    Vaccine refused by patient    Morbid obesity ([place])    Mixed dyslipidemia    Impotence of organic origin    Advanced directives, counseling/discussion      Plan:   A preventative services visit was completed today.  During the course of the visit today, we discussed and counseled about appropriate screening and preventive services.  A health risk assessment was established today that included a review of current medications, allergies, social history, family history, medical and preventative health history, biometrics, and preventative screenings to identify potential safety concerns or impairments.  A personalized plan was printed today for your records and use.   Personalized health advice and education was given today to reduce health risks and promote self management and  wellness.  Information regarding end of life planning was discussed today.  Conditions/risks identified: No new conditions   Chronic problems discussed today: Diabetes-continue current medications, continue glucose monitoring, continue daily foot checks, plan to come in next week as we discussed for hemoglobin A1c check.  Atrial fibrillation, chronic heart failure, heart disease-reviewed recent cardiology notes.  Recently Coumadin was stopped and Eliquis added.  Hyperlipidemia-continue Praluent and atorvastatin  Continue current blood pressure medications  OSA-continue CPAP   Acute problems discussed today: None  Recommendations:  I recommend a yearly ophthalmology/optometry visit for glaucoma screening and eye checkup  I recommended a yearly dental visit for hygiene and checkup  Advanced directives - discussed nature and purpose of Advanced Directives, encouraged them to complete them if they have not done so and/or encouraged them to get [place] a copy if they have done this already.   Referrals today: none  Immunizations: In general he declines vaccines.  Offered vaccines once again today    Medicare Attestation A preventative services visit was completed today.  During the course of the visit the patient was educated and counseled about appropriate screening and preventive services.  A health risk assessment was established with the patient that included a review of current medications, allergies, social history, family history, medical and preventative health history, biometrics, and preventative screenings to identify potential safety concerns or impairments.  A personalized plan was printed today for the patient's records and use.   Personalized health advice and education was given today to reduce health risks and promote self management and wellness.  Information regarding end of life planning was discussed today.  Dorothea Ogle, PA-C   12/11/2018

## 2018-12-12 ENCOUNTER — Other Ambulatory Visit: Payer: Self-pay | Admitting: Medical

## 2018-12-16 ENCOUNTER — Other Ambulatory Visit: Payer: Self-pay | Admitting: Medical

## 2018-12-16 ENCOUNTER — Other Ambulatory Visit: Payer: Self-pay

## 2018-12-16 ENCOUNTER — Other Ambulatory Visit (INDEPENDENT_AMBULATORY_CARE_PROVIDER_SITE_OTHER): Payer: Medicare Other

## 2018-12-16 DIAGNOSIS — E118 Type 2 diabetes mellitus with unspecified complications: Secondary | ICD-10-CM

## 2018-12-16 LAB — POCT GLYCOSYLATED HEMOGLOBIN (HGB A1C): Hemoglobin A1C: 6.6 % — AB (ref 4.0–5.6)

## 2018-12-16 MED ORDER — INSULIN DEGLUDEC 100 UNIT/ML ~~LOC~~ SOPN: PEN_INJECTOR | SUBCUTANEOUS | 5 refills | Status: DC

## 2018-12-16 MED ORDER — SEMAGLUTIDE (1 MG/DOSE) 2 MG/1.5ML ~~LOC~~ SOPN: 1.0000 mg | PEN_INJECTOR | SUBCUTANEOUS | 5 refills | Status: DC

## 2018-12-31 ENCOUNTER — Other Ambulatory Visit: Payer: Self-pay | Admitting: Cardiology

## 2019-01-23 ENCOUNTER — Other Ambulatory Visit: Payer: Self-pay | Admitting: Cardiology

## 2019-02-05 ENCOUNTER — Other Ambulatory Visit: Payer: Self-pay | Admitting: Medical

## 2019-03-06 ENCOUNTER — Other Ambulatory Visit: Payer: Self-pay | Admitting: Medical

## 2019-03-06 ENCOUNTER — Other Ambulatory Visit: Payer: Self-pay

## 2019-03-06 DIAGNOSIS — E118 Type 2 diabetes mellitus with unspecified complications: Secondary | ICD-10-CM

## 2019-03-06 MED ORDER — ACCU-CHEK AVIVA PLUS VI STRP: ORAL_STRIP | 6 refills | Status: DC

## 2019-03-16 ENCOUNTER — Other Ambulatory Visit: Payer: Self-pay | Admitting: Cardiology

## 2019-04-20 ENCOUNTER — Other Ambulatory Visit: Payer: Self-pay | Admitting: Cardiology

## 2019-04-26 ENCOUNTER — Other Ambulatory Visit: Payer: Self-pay | Admitting: Cardiology

## 2019-05-01 ENCOUNTER — Encounter (INDEPENDENT_AMBULATORY_CARE_PROVIDER_SITE_OTHER): Payer: Medicare Other | Admitting: Ophthalmology

## 2019-05-01 DIAGNOSIS — E113393 Type 2 diabetes mellitus with moderate nonproliferative diabetic retinopathy without macular edema, bilateral: Secondary | ICD-10-CM

## 2019-05-01 DIAGNOSIS — E11319 Type 2 diabetes mellitus with unspecified diabetic retinopathy without macular edema: Secondary | ICD-10-CM | POA: Diagnosis not present

## 2019-05-01 DIAGNOSIS — H35033 Hypertensive retinopathy, bilateral: Secondary | ICD-10-CM | POA: Diagnosis not present

## 2019-05-01 DIAGNOSIS — H34812 Central retinal vein occlusion, left eye, with macular edema: Secondary | ICD-10-CM | POA: Diagnosis not present

## 2019-05-01 DIAGNOSIS — I10 Essential (primary) hypertension: Secondary | ICD-10-CM

## 2019-05-01 DIAGNOSIS — H43813 Vitreous degeneration, bilateral: Secondary | ICD-10-CM | POA: Diagnosis not present

## 2019-05-04 ENCOUNTER — Other Ambulatory Visit: Payer: Self-pay | Admitting: Medical

## 2019-07-02 NOTE — Progress Notes (Signed)
Cardiology Office Note    Date:  07/03/2019   ID:  [name], DOB 05-29-1951, MRN [id number]  PCP:  Travis Hurl, PA-C  Cardiologist:  Dr. Radford Peterson  EP: Dr. Rayann Peterson   Chief Complaint: Palpitations   History of Present Illness:   [name] is a 68 y.o. male  with a hx of ASCADwith chronically occluded RCA s/p failed PCI with L>>R collaterals, HTN, OSAon CPAP, chronic diastolic CHF, morbid obesity,PAFs/p afib ablation, atrial flutterunable to ablateat EP studyanddyslipidemia seen for palpitations.   Added to my schedule for palpitations.  Last week patient had 3-4 episodes of atrial fibrillation.  Longest episode lasted for 3 days straight.  He did not check his pulse.  He can tell he is in A. fib by fluttering sensation and some shortness of breath.  No chest pain, dizziness, orthopnea, PND, syncope, lower extremity edema or melena.  No Covid symptoms or exposure.  No recent weight gain.  Again took extra metoprolol.  He denies irregular heart rate since last 4 days.  Past Medical History:  Diagnosis Date  . Atrial flutter ([place])    a. atypical atrial flutter.  . Bell's palsy   . CAD (coronary artery disease)    a. 07/2009 Cath: LM nl, LAD nl, LCX nl w/ L->R collats, chronically occluded RCA s/p failed PCI;  b. 07/2012 MV: basal inf mild ischemia.  . Central retinal artery occlusion, left eye    diagnosed in 2008  . Cerebrovascular accident [facility])    a. 03/2008 secondary to cardioembolic event;  b. chronic coumadin.  . Chronic diastolic CHF (congestive heart failure) ([place])    a. 12/2008 Echo: nl EF.  . Diabetes mellitus 11/14/2010   TYPE II  . Hematuria 05/2013   Urology, Dr. Estill Dooms  . Hyperlipidemia   . Hypertension   . Hypogonadism, male 05/2013   Dr. Estill Dooms, Urology  . Morbid obesity ([place])   . Obesity   . Obstructive sleep apnea   . Persistent atrial fibrillation ([place])    a. s/p afib ablation by [facility] 08/01/08  . Recommendation refused by patient    multiple  recommended vaccines refused over the years (flu/pneumovax)  . Skin infection    chronic left leg herpetic    Past Surgical History:  Procedure Laterality Date  . ATRIAL ABLATION SURGERY  08/01/08   CTI and PVI ablation by [facility]  . COLONOSCOPY     2012 per patient  . ELECTROPHYSIOLOGIC STUDY N/A 11/30/2014   Procedure: Atrial Fibrillation Ablation;  Surgeon: [name], MD;  Location: [place] CV LAB;  Service: Cardiovascular;  Laterality: N/A;  . TEE WITHOUT CARDIOVERSION N/A 11/30/2014   Procedure: TRANSESOPHAGEAL ECHOCARDIOGRAM (TEE);  Surgeon: Travis Hector, MD;  Location: [facility] ENDOSCOPY;  Service: Cardiovascular;  Laterality: N/A;    Current Medications: Prior to Admission medications   Medication Sig Start Date End Date Taking? Authorizing Provider  ACCU-CHEK FASTCLIX LANCETS MISC Test blood sugar bid. Dx code E11.9 03/26/17   Peterson, Travis Eng, PA-C  albuterol (PROVENTIL HFA;VENTOLIN HFA) 108 (90 Base) MCG/ACT inhaler Inhale 2 puffs into the lungs every 6 (six) hours as needed for wheezing or shortness of breath. Patient not taking: Reported on 12/11/2018 05/19/18   Peterson, Travis Eng, PA-C  amLODipine (NORVASC) 5 MG tablet Take 1 tablet by mouth once daily 01/26/19   Travis Margarita, MD  apixaban (ELIQUIS) 5 MG TABS tablet Take 1 tablet (5 mg total) by mouth 2 (two) times daily. APPOINTMENT NEEDED WITH CARDIOLOGIST  FOR FURTHER REFILLS 04/20/19   Travis Margarita, MD  atorvastatin (LIPITOR) 80 MG tablet Take 1 tablet by mouth once daily 12/16/18   Peterson, Travis Eng, PA-C  b complex vitamins capsule Take 1 capsule by mouth every other day.     [provider]  Cholecalciferol (VITAMIN D3) 10000 UNITS capsule Take 1 capsule (10,000 Units total) by mouth daily. 01/19/15   Peterson, Travis Eng, PA-C  glucose blood (ACCU-CHEK AVIVA PLUS) test strip USE 1 STRIP TO CHECK GLUCOSE TWICE DAILY 03/06/19   Peterson, Travis Eng, PA-C  insulin degludec (TRESIBA FLEXTOUCH) 100 UNIT/ML SOPN FlexTouch Pen  INJECT 10 UNITS SUBCUTANEOUSLY ONCE DAILY AT  10  PM  AS  DIRECTED 12/16/18   Peterson, Travis Eng, PA-C  Insulin Pen Needle (BD PEN NEEDLE NANO U/F) 32G X 4 MM MISC 1 each by Does not apply route at bedtime. 08/26/18   Peterson, Travis Eng, PA-C  isosorbide mononitrate (IMDUR) 30 MG 24 hr tablet Take 1 tablet by mouth once daily 03/17/19   Travis Margarita, MD  losartan (COZAAR) 100 MG tablet Take 1 tablet by mouth once daily 12/08/18   Travis Margarita, MD  metFORMIN (GLUCOPHAGE-XR) 500 MG 24 hr tablet TAKE 2 TABLETS BY MOUTH ONCE DAILY WITH BREAKFAST 12/08/18   Peterson, Travis Eng, PA-C  metoprolol tartrate (LOPRESSOR) 50 MG tablet Take 1 tablet by mouth twice daily 12/31/18   Travis Margarita, MD  omega-3 acid ethyl esters (LOVAZA) 1 g capsule Take 2 capsules (2 g total) by mouth 2 (two) times daily. 08/26/18   Peterson, Travis Eng, PA-C  PRALUENT 75 MG/ML SOAJ INJECT 1 PEN INTO THE SKIN EVERY 14 DAYS 04/27/19   Turner, Eber Hong, MD  Semaglutide, 1 MG/DOSE, (OZEMPIC, 1 MG/DOSE,) 2 MG/1.5ML SOPN Inject 1 mg into the skin once a week. 12/16/18   Peterson, Travis Eng, PA-C  Testosterone (ANDROGEL PUMP) 12.5 MG/ACT (1%) GEL Place 2 application onto the skin daily.     [provider]  valACYclovir (VALTREX) 500 MG tablet Take 1 tablet by mouth once daily 05/04/19   Peterson, Travis Eng, PA-C    Allergies:   Crestor [rosuvastatin], Zetia [ezetimibe], Lisinopril, and Farxiga [dapagliflozin]   Social History   Socioeconomic History  . Marital status: Married    Spouse name: Not on file  . Number of children: Not on file  . Years of education: Not on file  . Highest education level: Not on file  Occupational History  . Not on file  Tobacco Use  . Smoking status: Former Smoker    Quit date: 07/24/1979    Years since quitting: 39.9  . Smokeless tobacco: Never Used  Substance and Sexual Activity  . Alcohol use: No    Comment: former  . Drug use: No    Comment: former  . Sexual activity: Not on file  Other Topics  Concern  . Not on file  Social History Narrative   Lives in [place], [place]. With his spouse and son. Has remote history of tobacco, but quit 30 years ago. Has heavy history of alcohol consumption but quit 30 years ago. Previously used marijuana and cocaine, but denies use over the past 30 years. 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    Social Determinants of Health   Financial Resource Strain:   . Difficulty of Paying Living Expenses: Not on file  Food Insecurity:   . Worried About Charity fundraiser in the Last Year: Not on file  .  Ran Out of Food in the Last Year: Not on file  Transportation Needs:   . Lack of Transportation (Medical): Not on file  . Lack of Transportation (Non-Medical): Not on file  Physical Activity:   . Days of Exercise per Week: Not on file  . Minutes of Exercise per Session: Not on file  Stress:   . Feeling of Stress : Not on file  Social Connections:   . Frequency of Communication with Friends and Family: Not on file  . Frequency of Social Gatherings with Friends and Family: Not on file  . Attends Religious Services: Not on file  . Active Member of Clubs or Organizations: Not on file  . Attends Archivist Meetings: Not on file  . Marital Status: Not on file     Family History:  The patient's family history includes Heart disease in his father and mother; Stroke in his father.   ROS:   Please see the history of present illness.    ROS All other systems reviewed and are negative.   PHYSICAL EXAM:   VS:  BP 128/72   Pulse 75   Ht _0  (1.803 m)   Wt 257 lb (116.6 kg)   SpO2 96%   BMI 35.84 kg/m    GEN: Well nourished, well developed, in no acute distress  HEENT: normal  Neck: no JVD, carotid bruits, or masses Cardiac: RRR; no murmurs, rubs, or gallops,no edema  Respiratory:  clear to auscultation bilaterally, normal work of breathing GI: soft, nontender, nondistended, + BS MS: no deformity or atrophy  Skin: warm and dry,  no rash Neuro:  Alert and Oriented x 3, Strength and sensation are intact Psych: euthymic mood, full affect  Wt Readings from Last 3 Encounters:  07/03/19 257 lb (116.6 kg)  09/22/18 276 lb (125.2 kg)  08/22/18 278 lb 3.2 oz (126.2 kg)      Studies/Labs Reviewed:   EKG:  EKG is ordered today.  The ekg ordered today demonstrates NSR at rate of 75bpm  Recent Labs: 08/25/2018: ALT 20; BUN 13; Creatinine, Ser 0.95; Hemoglobin 13.9; Platelets 272; Potassium 5.1; Sodium 139   Lipid Panel    Component Value Date/Time   CHOL 119 08/25/2018 0900   TRIG 189 (H) 08/25/2018 0900   HDL 42 08/25/2018 0900   CHOLHDL 2.8 08/25/2018 0900   CHOLHDL 3.3 07/30/2017 1050   VLDL 44 (H) 09/06/2016 1020   LDLCALC 39 08/25/2018 0900   LDLCALC 65 07/30/2017 1050   LDLDIRECT 87.7 05/08/2013 0837    Additional studies/ records that were reviewed today include:   Echocardiogram: 10/2014 Study Conclusions  - Left ventricle: The cavity size was normal. There was moderate   focal basal hypertrophy of the septum. Systolic function was   normal. The estimated ejection fraction was in the range of 60%   to 65%. Wall motion was normal; there were no regional wall   motion abnormalities. Doppler parameters are consistent with   pseudonormal left ventricular relaxation (grade 2 diastolic   dysfunction). The E/A ratio is 1.8, the E/e&' ratio is >15,   suggesting elevated LV filling pressure. - Aortic valve: Trileaflet; mildly calcified leaflets. - Mitral valve: Calcified annulus. There was trivial regurgitation. - Left atrium: The atrium was at the upper limits of normal in   size. - Tricuspid valve: There was moderate regurgitation. - Pulmonary arteries: PA peak pressure: 48 mm Hg (S). - Inferior vena cava: The vessel was normal in size. The   respirophasic  diameter changes were in the normal range (= 50%),   consistent with normal central venous pressure.  Impressions:  - LVEF 60-65%, moderate  focal basal septal hypertrophy without LVOT   obstruction, normal wall motion, Stage 2 diastolic dysfunction,   upper normal LV size, MAC, trivial MR, moderate TR, RVSP 48 mmHg,   normal IVC size.   ASSESSMENT & PLAN:   1.  Paroxysmal atrial fibrillation s/p ablation Patient had a few episodes of A. fib last week or so, longest lasting 3 days.  Extra metoprolol helped.  He feels fluttering sensation and shortness of breath when he goes out of rhythm.  He is in a sinus rhythm for past 3 to 4 days.  Compliant with his medication.  No bleeding issue.  I have recommended continuing to monitor the symptoms.  Take extra as needed metoprolol for heart rate above 110.  If persistent recurrence he will give [place] a call.  2. CAD with chronically occluded RCA s/p failed PCI with L>>R collaterals.  -No angina.  Continue statin, Imdur and beta-blocker.  Not on aspirin due to need of anticoagulation.  3.  Chronic diastolic CHF -Euvolemic.  No change in therapy.  4.  HTN -Blood pressure stable and well-controlled on current medication.  5. OSA -Compliant with CPAP.  Encouraged weight loss.    Medication Adjustments/Labs and Tests Ordered: Current medicines are reviewed at length with the patient today.  Concerns regarding medicines are outlined above.  Medication changes, Labs and Tests ordered today are listed in the Patient Instructions below. Patient Instructions  Medication Instructions:  1. CONTINUE ON CURRENT MEDICATIONS 2. NEW RX WAS SENT IN WITH ADDITIONAL INSTRUCTIONS OK TO TAKE 1 EXTRA METOPROLOL 50 MG IF YOUR HEART RATE IS 110 OR HIGHER  *If you need a refill on your cardiac medications before your next appointment, please call your pharmacy*  Lab Work: NONE ORDERED TODAY If you have labs (blood work) drawn today and your tests are completely normal, you will receive your results only by: [name] MyChart Message (if you have MyChart) OR . A paper copy in the mail If you have any lab test  that is abnormal or we need to change your treatment, we will call you to review the results.  Testing/Procedures: NONE ORDERED TODAY  Follow-Up: At [facility], you and your health needs are our priority.  As part of our continuing mission to provide you with exceptional heart care, we have created designated Provider Care Teams.  These Care Teams include your primary Cardiologist (physician) and Advanced Practice Providers (APPs -  Physician Assistants and Nurse Practitioners) who all work together to provide you with the care you need, when you need it.  Your next appointment:   WE WILL SEND OUT A REMINDER LETTER FOR YOU TO MAKE AN APPT WITH DR. Radford Peterson FOR APPT IN MARCH 2021  The format for your next appointment:   IN PERSON   Provider:   DR. Rayann Peterson IN 1-2 MONTHS   Other Instructions OUR EP SCHEDULER [facility] WILL CALL YOU WITH AN APPT FOR DR. Rema Jasmine, PA  07/03/2019 10:33 AM    [facility] [address], [place], [place]  [address] Phone: [phone]; Fax: [phone]

## 2019-07-03 ENCOUNTER — Ambulatory Visit (INDEPENDENT_AMBULATORY_CARE_PROVIDER_SITE_OTHER): Payer: Medicare Other | Admitting: Physician Assistant

## 2019-07-03 ENCOUNTER — Encounter: Payer: Self-pay | Admitting: Physician Assistant

## 2019-07-03 ENCOUNTER — Other Ambulatory Visit: Payer: Self-pay

## 2019-07-03 VITALS — BP 128/72 | HR 75 | Ht 71.0 in | Wt 257.0 lb

## 2019-07-03 DIAGNOSIS — G4733 Obstructive sleep apnea (adult) (pediatric): Secondary | ICD-10-CM

## 2019-07-03 DIAGNOSIS — I4819 Other persistent atrial fibrillation: Secondary | ICD-10-CM

## 2019-07-03 DIAGNOSIS — I10 Essential (primary) hypertension: Secondary | ICD-10-CM

## 2019-07-03 DIAGNOSIS — I2510 Atherosclerotic heart disease of native coronary artery without angina pectoris: Secondary | ICD-10-CM | POA: Diagnosis not present

## 2019-07-03 MED ORDER — METOPROLOL TARTRATE 50 MG PO TABS: 50.0000 mg | ORAL_TABLET | Freq: Two times a day (BID) | ORAL | 3 refills | Status: DC

## 2019-07-03 NOTE — Patient Instructions (Signed)
Medication Instructions:  1. CONTINUE ON CURRENT MEDICATIONS 2. NEW RX WAS SENT IN WITH ADDITIONAL INSTRUCTIONS OK TO TAKE 1 EXTRA METOPROLOL 50 MG IF YOUR HEART RATE IS 110 OR HIGHER  *If you need a refill on your cardiac medications before your next appointment, please call your pharmacy*  Lab Work: NONE ORDERED TODAY If you have labs (blood work) drawn today and your tests are completely normal, you will receive your results only by: [name] MyChart Message (if you have MyChart) OR . A paper copy in the mail If you have any lab test that is abnormal or we need to change your treatment, we will call you to review the results.  Testing/Procedures: NONE ORDERED TODAY  Follow-Up: At [facility], you and your health needs are our priority.  As part of our continuing mission to provide you with exceptional heart care, we have created designated Provider Care Teams.  These Care Teams include your primary Cardiologist (physician) and Advanced Practice Providers (APPs -  Physician Assistants and Nurse Practitioners) who all work together to provide you with the care you need, when you need it.  Your next appointment:   WE WILL SEND OUT A REMINDER LETTER FOR YOU TO MAKE AN APPT WITH DR. Radford Pax FOR APPT IN MARCH 2021  The format for your next appointment:   IN PERSON   Provider:   DR. Rayann Heman IN 1-2 MONTHS   Other Instructions OUR EP SCHEDULER ASHLAND WILL CALL YOU WITH AN APPT FOR DR. ALLRED

## 2019-07-06 ENCOUNTER — Telehealth (INDEPENDENT_AMBULATORY_CARE_PROVIDER_SITE_OTHER): Payer: Medicare Other | Admitting: Internal Medicine

## 2019-07-06 ENCOUNTER — Other Ambulatory Visit: Payer: Self-pay

## 2019-07-06 ENCOUNTER — Encounter: Payer: Self-pay | Admitting: Internal Medicine

## 2019-07-06 VITALS — Ht 71.0 in | Wt 257.0 lb

## 2019-07-06 DIAGNOSIS — I10 Essential (primary) hypertension: Secondary | ICD-10-CM | POA: Diagnosis not present

## 2019-07-06 DIAGNOSIS — I480 Paroxysmal atrial fibrillation: Secondary | ICD-10-CM | POA: Diagnosis not present

## 2019-07-06 DIAGNOSIS — I2510 Atherosclerotic heart disease of native coronary artery without angina pectoris: Secondary | ICD-10-CM

## 2019-07-06 NOTE — Progress Notes (Signed)
Electrophysiology TeleHealth Note  Due to national recommendations of social distancing due to [place] 19, an audio telehealth visit is felt to be most appropriate for this patient at this time.  Verbal consent was obtained by me for the telehealth visit today.  The patient does not have capability for a virtual visit.  A phone visit is therefore required today.   Date:  07/06/2019   ID:  [name], DOB 09-20-1950, MRN [id number]  Location: patient's home  Provider location:  [facility]  Evaluation Performed: Follow-up visit  PCP:  Carlena Hurl, PA-C   Electrophysiologist:  Dr Rayann Heman  Chief Complaint:  palpitations  History of Present Illness:    [name] is a 68 y.o. male who presents via telehealth conferencing today.  Since last being seen in our clinic, the patient reports doing very well.  He reports having his first AF episode in several years last week.  The episode lasted about 3 days and spontaneously terminated.  He thinks that this episode was due to financial stress.  He is not able to work right now. He is back in sinus rhythm.  He has had more fatigue than usual.  Mild SOB which is improving.  Today, he denies symptoms of palpitations, chest pain,  lower extremity edema, dizziness, presyncope, or syncope.  The patient is otherwise without complaint today.  The patient denies symptoms of fevers, chills, cough, or new SOB worrisome for COVID 19.  Past Medical History:  Diagnosis Date  . Atrial flutter ([place])    a. atypical atrial flutter.  . Bell's palsy   . CAD (coronary artery disease)    a. 07/2009 Cath: LM nl, LAD nl, LCX nl w/ L->R collats, chronically occluded RCA s/p failed PCI;  b. 07/2012 MV: basal inf mild ischemia.  . Central retinal artery occlusion, left eye    diagnosed in 2008  . Cerebrovascular accident [facility])    a. 03/2008 secondary to cardioembolic event;  b. chronic coumadin.  . Chronic diastolic CHF (congestive heart failure) ([place])    a. 12/2008 Echo: nl EF.  . Diabetes mellitus 11/14/2010   TYPE II  . Hematuria 05/2013   Urology, Dr. Estill Dooms  . Hyperlipidemia   . Hypertension   . Hypogonadism, male 05/2013   Dr. Estill Dooms, Urology  . Morbid obesity ([place])   . Obesity   . Obstructive sleep apnea   . Persistent atrial fibrillation ([place])    a. s/p afib ablation by [facility] 08/01/08  . Recommendation refused by patient    multiple recommended vaccines refused over the years (flu/pneumovax)  . Skin infection    chronic left leg herpetic    Past Surgical History:  Procedure Laterality Date  . ATRIAL ABLATION SURGERY  08/01/08   CTI and PVI ablation by [facility]  . COLONOSCOPY     2012 per patient  . ELECTROPHYSIOLOGIC STUDY N/A 11/30/2014   Procedure: Atrial Fibrillation Ablation;  Surgeon: [name], MD;  Location: [place] CV LAB;  Service: Cardiovascular;  Laterality: N/A;  . TEE WITHOUT CARDIOVERSION N/A 11/30/2014   Procedure: TRANSESOPHAGEAL ECHOCARDIOGRAM (TEE);  Surgeon: Josue Hector, MD;  Location: [facility] ENDOSCOPY;  Service: Cardiovascular;  Laterality: N/A;    Current Outpatient Medications  Medication Sig Dispense Refill  . ACCU-CHEK FASTCLIX LANCETS MISC Test blood sugar bid. Dx code E11.9 102 each 1  . amLODipine (NORVASC) 5 MG tablet Take 1 tablet by mouth once daily 90 tablet 2  . apixaban (ELIQUIS) 5 MG TABS  tablet Take 1 tablet (5 mg total) by mouth 2 (two) times daily. APPOINTMENT NEEDED WITH CARDIOLOGIST FOR FURTHER REFILLS 180 tablet 0  . atorvastatin (LIPITOR) 80 MG tablet Take 1 tablet by mouth once daily 90 tablet 1  . b complex vitamins capsule Take 1 capsule by mouth every other day.     . Cholecalciferol (VITAMIN D3) 10000 UNITS capsule Take 1 capsule (10,000 Units total) by mouth daily. 4 capsule 0  . glucose blood (ACCU-CHEK AVIVA PLUS) test strip USE 1 STRIP TO CHECK GLUCOSE TWICE DAILY 50 each 6  . insulin degludec (TRESIBA FLEXTOUCH) 100 UNIT/ML SOPN FlexTouch Pen INJECT 10 UNITS SUBCUTANEOUSLY ONCE  DAILY AT  10  PM  AS  DIRECTED 15 mL 5  . Insulin Pen Needle (BD PEN NEEDLE NANO U/F) 32G X 4 MM MISC 1 each by Does not apply route at bedtime. 100 each 11  . isosorbide mononitrate (IMDUR) 30 MG 24 hr tablet Take 1 tablet by mouth once daily 90 tablet 2  . losartan (COZAAR) 100 MG tablet Take 1 tablet by mouth once daily 90 tablet 2  . metoprolol tartrate (LOPRESSOR) 50 MG tablet Take 1 tablet (50 mg total) by mouth 2 (two) times daily. MAY TAKE 1 EXTRA METOPROLOL IF YOUR HEART RATE IS 110 OR HIGHER 200 tablet 3  . omega-3 acid ethyl esters (LOVAZA) 1 g capsule Take 2 capsules (2 g total) by mouth 2 (two) times daily. 360 capsule 3  . PRALUENT 75 MG/ML SOAJ INJECT 1 PEN INTO THE SKIN EVERY 14 DAYS 2 pen 11  . Semaglutide, 1 MG/DOSE, (OZEMPIC, 1 MG/DOSE,) 2 MG/1.5ML SOPN Inject 1 mg into the skin once a week. 1.5 mL 5  . Testosterone (ANDROGEL PUMP) 12.5 MG/ACT (1%) GEL Place 2 application onto the skin daily.     . valACYclovir (VALTREX) 500 MG tablet Take 1 tablet by mouth once daily 90 tablet 0   No current facility-administered medications for this visit.    Allergies:   Crestor [rosuvastatin], Zetia [ezetimibe], Lisinopril, and Farxiga [dapagliflozin]   Social History:  The patient  reports that he quit smoking about 39 years ago. He has never used smokeless tobacco. He reports that he does not drink alcohol or use drugs.   Family History:  The patient's  family history includes Heart disease in his father and mother; Stroke in his father.   ROS:  Please see the history of present illness.   All other systems are personally reviewed and negative.    Exam:    Vital Signs:  Ht 5\' 11"  (1.803 m)   Wt 257 lb (116.6 kg)   BMI 35.84 kg/m   Well sounding, alert and conversant   Labs/Other Tests and Data Reviewed:    Recent Labs: 08/25/2018: ALT 20; BUN 13; Creatinine, Ser 0.95; Hemoglobin 13.9; Platelets 272; Potassium 5.1; Sodium 139   Wt Readings from Last 3 Encounters:  07/06/19  257 lb (116.6 kg)  07/03/19 257 lb (116.6 kg)  09/22/18 276 lb (125.2 kg)    ASSESSMENT & PLAN:    1.  Paroxysmal afib/ atypical atrial flutter He has done great for several years post ablation despite inability to achieve appropriate lifestyle change. He is on eliquis He has not required AAD therapy for several years Hopefully, he will continue to maintain sinus rhythm  We discussed lifestyle modification at length today.  2. HTN Stable No change required today  3. Overweight Lifestyle modification discussed at length today  4. Cad No  ischemic symptoms Followed by Dr Radford Pax  5. Chronic diastolic dysfunction Sodium restriction is advised  6. OSA Compliance with CPAP encouraged  Follow-up:  AF clinic in 2 months   Patient Risk:  after full review of this patients clinical status, I feel that they are at moderate risk at this time.  Today, I have spent 15 minutes with the patient with telehealth technology discussing arrhythmia management .    Army Fossa, MD  07/06/2019 10:48 AM     [facility] [address] [place] [address] [phone] (office) [phone] (fax)

## 2019-07-15 IMAGING — CR DG LUMBAR SPINE COMPLETE 4+V
5 series · 5 of 5 positions shown · non-contrast
Comparison: Chest x-ray 10/29/2014.  Ultrasound 08/16/2008.

CLINICAL DATA: Chronic low back pain.

EXAM:
LUMBAR SPINE - COMPLETE 4+ VIEW

[t l-spine a.p.]
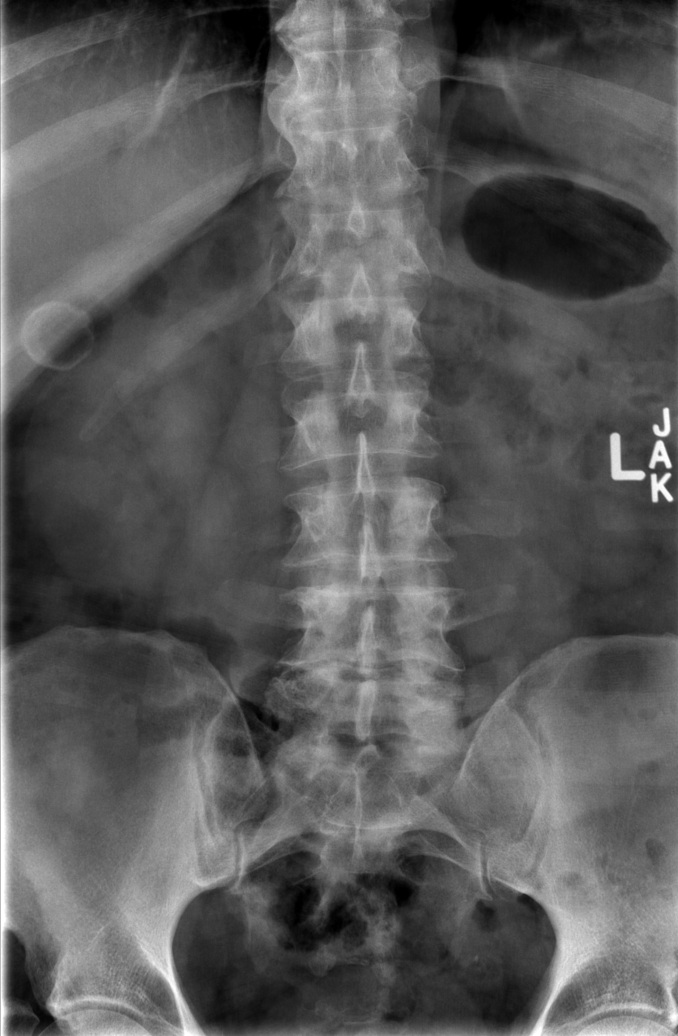

[t l-spine oblique exposure (1 of 2)]
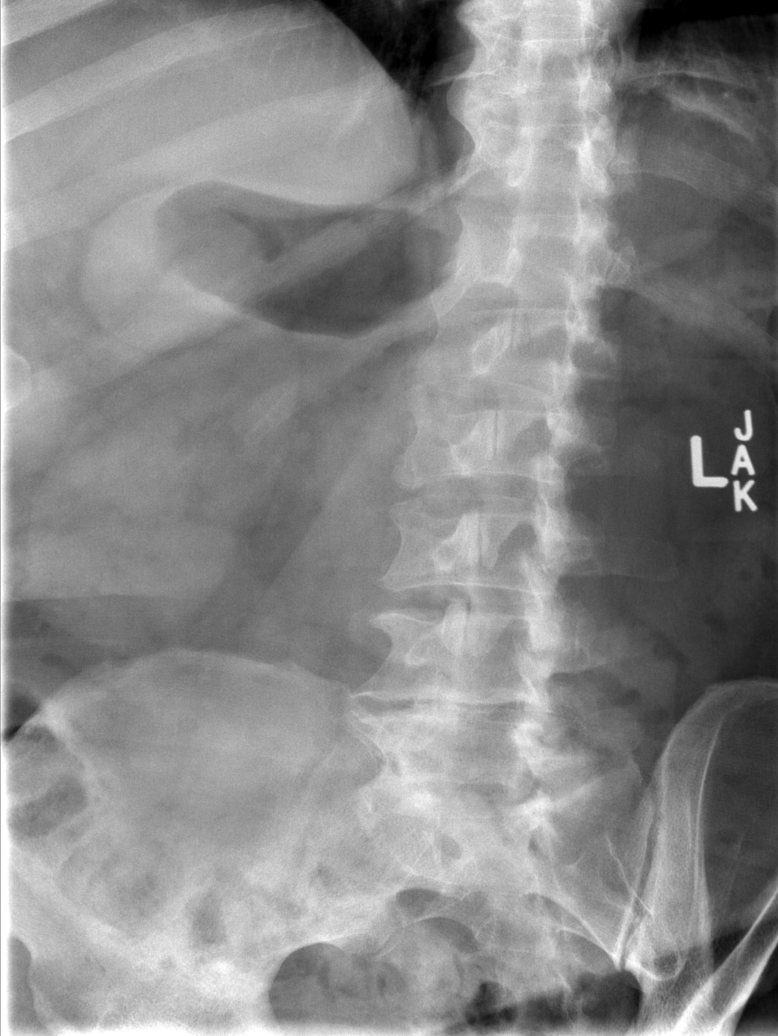

[t l-spine oblique exposure (2 of 2)]
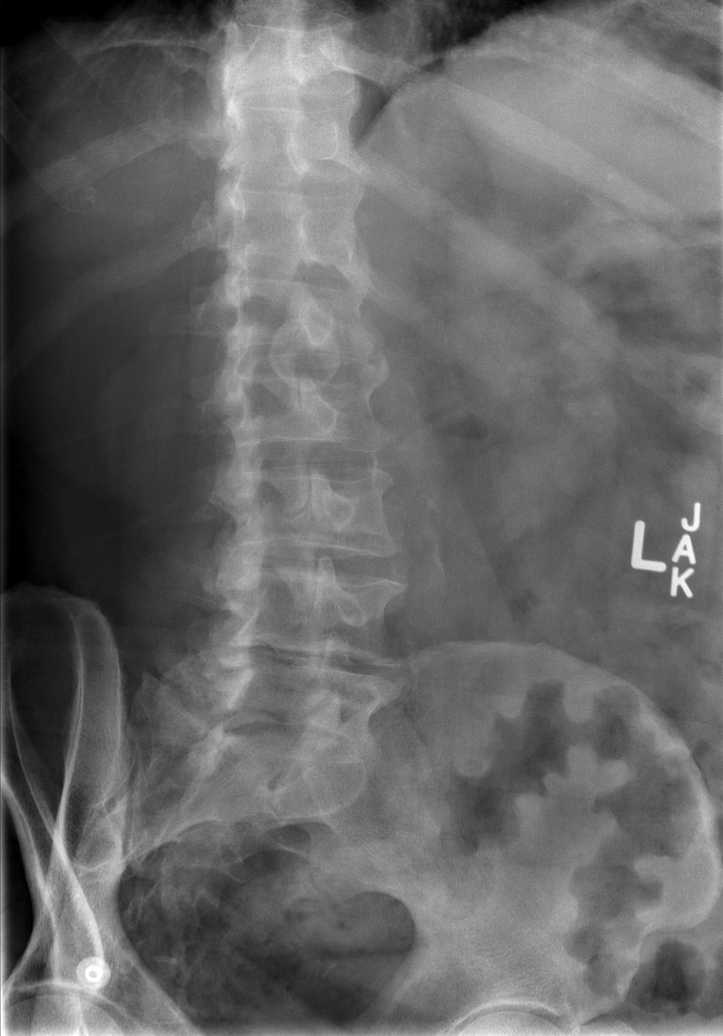

[t l-spine lat]
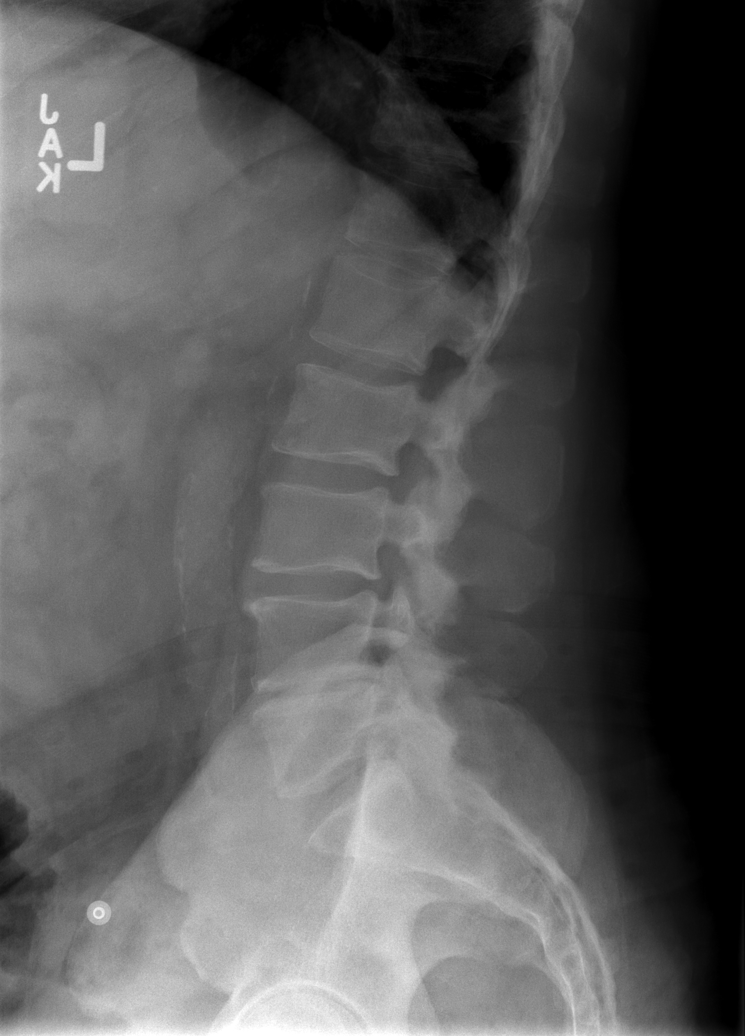

[t l-spine l5-s1 spot]
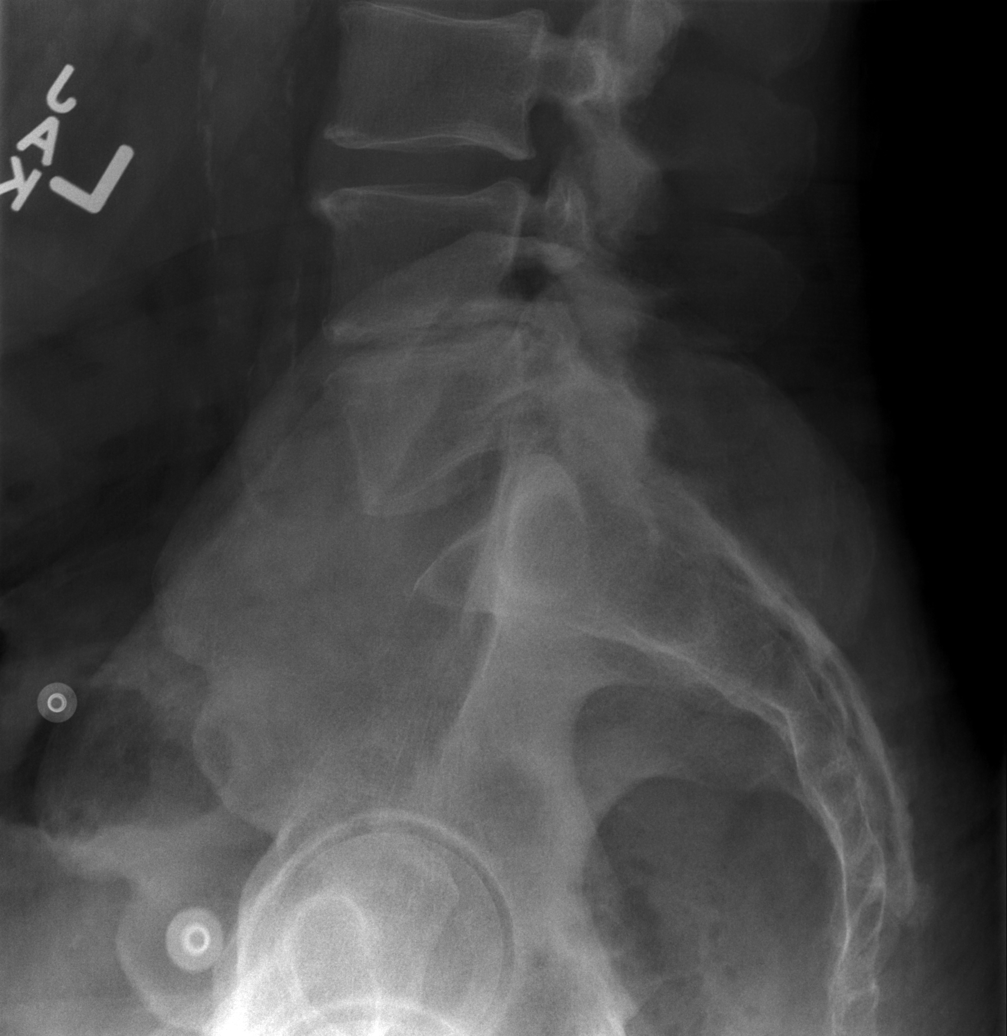

[5 of 5 positions shown; findings below may reference images not displayed]

FINDINGS: Lumbar spine numbered with the lowest segmented appearing lumbar
shaped vertebrae as L5. Diffuse multilevel degenerative change. No
acute bony abnormality. No evidence of fracture. Aortic
atherosclerotic vascular calcification. Rounded calcification noted
over the right upper quadrant consistent with known gallstone.
IMPRESSION: 1. Diffuse multilevel degenerative change, particularly prominent at
L4-L5. No acute bony abnormality.

2. Rounded calcification right upper quadrant consistent with known
gallstone.

## 2019-08-01 ENCOUNTER — Other Ambulatory Visit: Payer: Self-pay | Admitting: Medical

## 2019-08-01 ENCOUNTER — Other Ambulatory Visit: Payer: Self-pay | Admitting: Cardiology

## 2019-08-03 NOTE — Telephone Encounter (Signed)
Eliquis 5mg  refill request received, pt is 69yrs old, weight-116.6kg, Crea-0.95 on 08/25/2018, Diagnosis-Afib, and last seen by Dr. Rayann Heman on 07/06/2019 via Telemedicine. Dose is appropriate based on dosing criteria. Will send in refill to requested pharmacy.

## 2019-08-17 DIAGNOSIS — E291 Testicular hypofunction: Secondary | ICD-10-CM | POA: Diagnosis not present

## 2019-08-17 DIAGNOSIS — N5201 Erectile dysfunction due to arterial insufficiency: Secondary | ICD-10-CM | POA: Diagnosis not present

## 2019-08-17 DIAGNOSIS — Z960 Presence of urogenital implants: Secondary | ICD-10-CM | POA: Diagnosis not present

## 2019-08-17 DIAGNOSIS — N509 Disorder of male genital organs, unspecified: Secondary | ICD-10-CM | POA: Diagnosis not present

## 2019-09-07 ENCOUNTER — Ambulatory Visit (HOSPITAL_COMMUNITY)
Admission: RE | Admit: 2019-09-07 | Discharge: 2019-09-07 | Disposition: A | Discharge status: Home / Self Care | Payer: Medicare Other | Source: Ambulatory Visit | Attending: Nurse Practitioner | Admitting: Nurse Practitioner

## 2019-09-07 ENCOUNTER — Other Ambulatory Visit: Payer: Self-pay | Admitting: Cardiology

## 2019-09-07 ENCOUNTER — Other Ambulatory Visit: Payer: Self-pay

## 2019-09-07 ENCOUNTER — Encounter (HOSPITAL_COMMUNITY): Payer: Self-pay | Admitting: Nurse Practitioner

## 2019-09-07 VITALS — BP 126/94 | HR 150 | Ht 71.0 in | Wt 280.2 lb

## 2019-09-07 DIAGNOSIS — Z7901 Long term (current) use of anticoagulants: Secondary | ICD-10-CM | POA: Diagnosis not present

## 2019-09-07 DIAGNOSIS — Z87891 Personal history of nicotine dependence: Secondary | ICD-10-CM | POA: Diagnosis not present

## 2019-09-07 DIAGNOSIS — I4819 Other persistent atrial fibrillation: Secondary | ICD-10-CM | POA: Insufficient documentation

## 2019-09-07 DIAGNOSIS — Z79899 Other long term (current) drug therapy: Secondary | ICD-10-CM | POA: Diagnosis not present

## 2019-09-07 DIAGNOSIS — E785 Hyperlipidemia, unspecified: Secondary | ICD-10-CM | POA: Insufficient documentation

## 2019-09-07 DIAGNOSIS — I4892 Unspecified atrial flutter: Secondary | ICD-10-CM | POA: Insufficient documentation

## 2019-09-07 DIAGNOSIS — D6869 Other thrombophilia: Secondary | ICD-10-CM | POA: Diagnosis not present

## 2019-09-07 DIAGNOSIS — E118 Type 2 diabetes mellitus with unspecified complications: Secondary | ICD-10-CM | POA: Insufficient documentation

## 2019-09-07 DIAGNOSIS — Z794 Long term (current) use of insulin: Secondary | ICD-10-CM | POA: Diagnosis not present

## 2019-09-07 DIAGNOSIS — I110 Hypertensive heart disease with heart failure: Secondary | ICD-10-CM | POA: Insufficient documentation

## 2019-09-07 DIAGNOSIS — I2510 Atherosclerotic heart disease of native coronary artery without angina pectoris: Secondary | ICD-10-CM | POA: Insufficient documentation

## 2019-09-07 DIAGNOSIS — Z8673 Personal history of transient ischemic attack (TIA), and cerebral infarction without residual deficits: Secondary | ICD-10-CM | POA: Diagnosis not present

## 2019-09-07 DIAGNOSIS — I5032 Chronic diastolic (congestive) heart failure: Secondary | ICD-10-CM | POA: Diagnosis not present

## 2019-09-07 LAB — BASIC METABOLIC PANEL
Anion gap: 13 (ref 5–15)
BUN: 12 mg/dL (ref 8–23)
CO2: 24 mmol/L (ref 22–32)
Calcium: 9.2 mg/dL (ref 8.9–10.3)
Chloride: 99 mmol/L (ref 98–111)
Creatinine, Ser: 0.95 mg/dL (ref 0.61–1.24)
GFR calc Af Amer: >60 mL/min (ref >60)
GFR calc non Af Amer: >60 mL/min (ref >60)
Glucose, Bld: 176 mg/dL — ABNORMAL HIGH (ref 70–99)
Potassium: 4.4 mmol/L (ref 3.5–5.1)
Sodium: 136 mmol/L (ref 135–145)

## 2019-09-07 LAB — CBC
HCT: 49.8 % (ref 39.0–52.0)
Hemoglobin: 15.8 g/dL (ref 13.0–17.0)
MCH: 28.4 pg (ref 26.0–34.0)
MCHC: 31.7 g/dL (ref 30.0–36.0)
MCV: 89.4 fL (ref 80.0–100.0)
Platelets: 249 10*3/uL (ref 150–400)
RBC: 5.57 MIL/uL (ref 4.22–5.81)
RDW: 17.2 % — ABNORMAL HIGH (ref 11.5–15.5)
WBC: 8.1 10*3/uL (ref 4.0–10.5)
nRBC: 0 % (ref 0.0–0.2)

## 2019-09-07 NOTE — Progress Notes (Addendum)
Primary Care Physician: Carlena Hurl, PA-C Referring Physician: Dr. Loistine Chance Haydon is a 69 y.o. male with a h/o CAD, CVA, DM II,atrial  flutter/fib with ablations in 2010/2016. He is here today as a matter of a 2 month f/u from  Dr. Rayann Heman but states that he went out of rhythm since last Friday and not having power over the weekend, and not being able to use his CPAP, has contributed to him being out of rhythm since then. He usually will be out of rhythm  just a short period of time and will return on his on. EKG shows atrial flutter at 150 bpm and he is tolerating well so far. He has to go get a generator today as he still does not have power.He  continues on eliquis with a CHA2DS2VASc score of 7.  Today, he denies symptoms of palpitations, chest pain, shortness of breath, orthopnea, PND, lower extremity edema, dizziness, presyncope, syncope, or neurologic sequela. The patient is tolerating medications without difficulties and is otherwise without complaint today.   Past Medical History:  Diagnosis Date  . Atrial flutter ([place])    a. atypical atrial flutter.  . Bell's palsy   . CAD (coronary artery disease)    a. 07/2009 Cath: LM nl, LAD nl, LCX nl w/ L->R collats, chronically occluded RCA s/p failed PCI;  b. 07/2012 MV: basal inf mild ischemia.  . Central retinal artery occlusion, left eye    diagnosed in 2008  . Cerebrovascular accident [facility])    a. 03/2008 secondary to cardioembolic event;  b. chronic coumadin.  . Chronic diastolic CHF (congestive heart failure) ([place])    a. 12/2008 Echo: nl EF.  . Diabetes mellitus 11/14/2010   TYPE II  . Hematuria 05/2013   Urology, Dr. Estill Dooms  . Hyperlipidemia   . Hypertension   . Hypogonadism, male 05/2013   Dr. Estill Dooms, Urology  . Morbid obesity ([place])   . Obesity   . Obstructive sleep apnea   . Persistent atrial fibrillation ([place])    a. s/p afib ablation by [facility] 08/01/08  . Recommendation refused by patient    multiple recommended  vaccines refused over the years (flu/pneumovax)  . Skin infection    chronic left leg herpetic   Past Surgical History:  Procedure Laterality Date  . ATRIAL ABLATION SURGERY  08/01/08   CTI and PVI ablation by [facility]  . COLONOSCOPY     2012 per patient  . ELECTROPHYSIOLOGIC STUDY N/A 11/30/2014   Procedure: Atrial Fibrillation Ablation;  Surgeon: [name], MD;  Location: [place] CV LAB;  Service: Cardiovascular;  Laterality: N/A;  . TEE WITHOUT CARDIOVERSION N/A 11/30/2014   Procedure: TRANSESOPHAGEAL ECHOCARDIOGRAM (TEE);  Surgeon: Josue Hector, MD;  Location: [facility] ENDOSCOPY;  Service: Cardiovascular;  Laterality: N/A;    Current Outpatient Medications  Medication Sig Dispense Refill  . ACCU-CHEK FASTCLIX LANCETS MISC Test blood sugar bid. Dx code E11.9 102 each 1  . amLODipine (NORVASC) 5 MG tablet Take 1 tablet by mouth once daily 90 tablet 2  . atorvastatin (LIPITOR) 80 MG tablet Take 1 tablet by mouth once daily 90 tablet 0  . b complex vitamins capsule Take 1 capsule by mouth every other day.     . Cholecalciferol (VITAMIN D3) 10000 UNITS capsule Take 1 capsule (10,000 Units total) by mouth daily. 4 capsule 0  . ELIQUIS 5 MG TABS tablet TAKE 1 TABLET BY MOUTH TWICE DAILY (APPOINTMENT  NEEDED  WITH  CARDIOLOGIST  FOR  FURTHER  REFILLS) 180 tablet 0  . glucose blood (ACCU-CHEK AVIVA PLUS) test strip USE 1 STRIP TO CHECK GLUCOSE TWICE DAILY 50 each 6  . insulin degludec (TRESIBA FLEXTOUCH) 100 UNIT/ML SOPN FlexTouch Pen INJECT 10 UNITS SUBCUTANEOUSLY ONCE DAILY AT  10  PM  AS  DIRECTED 15 mL 5  . Insulin Pen Needle (BD PEN NEEDLE NANO U/F) 32G X 4 MM MISC 1 each by Does not apply route at bedtime. 100 each 11  . isosorbide mononitrate (IMDUR) 30 MG 24 hr tablet Take 1 tablet by mouth once daily 90 tablet 2  . losartan (COZAAR) 100 MG tablet Take 1 tablet by mouth once daily 90 tablet 2  . metoprolol tartrate (LOPRESSOR) 50 MG tablet Take 1 tablet (50 mg total) by mouth 2 (two) times  daily. MAY TAKE 1 EXTRA METOPROLOL IF YOUR HEART RATE IS 110 OR HIGHER 200 tablet 3  . omega-3 acid ethyl esters (LOVAZA) 1 g capsule Take 2 capsules (2 g total) by mouth 2 (two) times daily. 360 capsule 3  . PRALUENT 75 MG/ML SOAJ INJECT 1 PEN INTO THE SKIN EVERY 14 DAYS 2 pen 11  . Semaglutide, 1 MG/DOSE, (OZEMPIC, 1 MG/DOSE,) 2 MG/1.5ML SOPN Inject 1 mg into the skin once a week. 1.5 mL 5  . Testosterone (ANDROGEL PUMP) 12.5 MG/ACT (1%) GEL Place 2 application onto the skin daily.     [name] testosterone (ANDROGEL) 50 MG/5GM (1%) GEL Place onto the skin.    . valACYclovir (VALTREX) 500 MG tablet Take 1 tablet by mouth once daily 90 tablet 0   No current facility-administered medications for this encounter.    Allergies  Allergen Reactions  . Crestor [Rosuvastatin] Other (See Comments)    Made him feel crazy  . Zetia [Ezetimibe] Other (See Comments)    Made him feel crazy  . Lisinopril Cough  . Wilder Glade [Dapagliflozin]     Made heart race    Social History   Socioeconomic History  . Marital status: Married    Spouse name: Not on file  . Number of children: Not on file  . Years of education: Not on file  . Highest education level: Not on file  Occupational History  . Not on file  Tobacco Use  . Smoking status: Former Smoker    Quit date: 07/24/1979    Years since quitting: 40.1  . Smokeless tobacco: Never Used  Substance and Sexual Activity  . Alcohol use: No    Comment: former  . Drug use: No    Comment: former  . Sexual activity: Not on file  Other Topics Concern  . Not on file  Social History Narrative   Lives in [place], [place]. With his spouse and son. Has remote history of tobacco, but quit 30 years ago. Has heavy history of alcohol consumption but quit 30 years ago. Previously used marijuana and cocaine, but denies use over the past 30 years. 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    Social Determinants of Health   Financial Resource Strain:   . Difficulty  of Paying Living Expenses: Not on file  Food Insecurity:   . Worried About Charity fundraiser in the Last Year: Not on file  . Ran Out of Food in the Last Year: Not on file  Transportation Needs:   . Lack of Transportation (Medical): Not on file  . Lack of Transportation (Non-Medical): Not on file  Physical Activity:   . Days of Exercise per Week: Not on  file  . Minutes of Exercise per Session: Not on file  Stress:   . Feeling of Stress : Not on file  Social Connections:   . Frequency of Communication with Friends and Family: Not on file  . Frequency of Social Gatherings with Friends and Family: Not on file  . Attends Religious Services: Not on file  . Active Member of Clubs or Organizations: Not on file  . Attends Archivist Meetings: Not on file  . Marital Status: Not on file  Intimate Partner Violence:   . Fear of Current or Ex-Partner: Not on file  . Emotionally Abused: Not on file  . Physically Abused: Not on file  . Sexually Abused: Not on file    Family History  Problem Relation Age of Onset  . Stroke Father   . Heart disease Father   . Heart disease Mother     ROS- All systems are reviewed and negative except as per the HPI above  Physical Exam: Vitals:   09/07/19 0846  BP: (!) 126/94  Pulse: (!) 150  Weight: 127.1 kg  Height: _0  (1.803 m)   Wt Readings from Last 3 Encounters:  09/07/19 127.1 kg  07/06/19 116.6 kg  07/03/19 116.6 kg    Labs: Lab Results  Component Value Date   NA 139 08/25/2018   K 5.1 08/25/2018   CL 102 08/25/2018   CO2 23 08/25/2018   GLUCOSE 141 (H) 08/25/2018   BUN 13 08/25/2018   CREATININE 0.95 08/25/2018   CALCIUM 9.3 08/25/2018   MG 2.1 05/14/2008   Lab Results  Component Value Date   INR 2.8 10/13/2018   Lab Results  Component Value Date   CHOL 119 08/25/2018   HDL 42 08/25/2018   LDLCALC 39 08/25/2018   TRIG 189 (H) 08/25/2018     GEN- The patient is well appearing, alert and oriented x 3  today.   Head- normocephalic, atraumatic Eyes-  Sclera clear, conjunctiva pink Ears- hearing intact Oropharynx- clear Neck- supple, no JVP Lymph- no cervical lymphadenopathy Lungs- Clear to ausculation bilaterally, normal work of breathing Heart- Rapid regular rate and rhythm, no murmurs, rubs or gallops, PMI not laterally displaced GI- soft, NT, ND, + BS Extremities- no clubbing, cyanosis, or edema MS- no significant deformity or atrophy Skin- no rash or lesion Psych- euthymic mood, full affect Neuro- strength and sensation are intact  EKG- atrial flutter at 150 bpm, qrs int 74 ms, qtc 382 ms Echo-Study Conclusions  - Left ventricle: The cavity size was normal. There was moderate   focal basal hypertrophy of the septum. Systolic function was   normal. The estimated ejection fraction was in the range of 60%   to 65%. Wall motion was normal; there were no regional wall   motion abnormalities. Doppler parameters are consistent with   pseudonormal left ventricular relaxation (grade 2 diastolic   dysfunction). The E/A ratio is 1.8, the E/e&' ratio is >15,   suggesting elevated LV filling pressure. - Aortic valve: Trileaflet; mildly calcified leaflets. - Mitral valve: Calcified annulus. There was trivial regurgitation. - Left atrium: The atrium was at the upper limits of normal in   size. - Tricuspid valve: There was moderate regurgitation. - Pulmonary arteries: PA peak pressure: 48 mm Hg (S). - Inferior vena cava: The vessel was normal in size. The   respirophasic diameter changes were in the normal range (= 50%),   consistent with normal central venous pressure.  Impressions:  - LVEF 60-65%,  moderate focal basal septal hypertrophy without LVOT   obstruction, normal wall motion, Stage 2 diastolic dysfunction,   upper normal LV size, MAC, trivial MR, moderate TR, RVSP 48 mmHg,   normal IVC size.   Assessment and Plan: 1. Atrial  flutter  Out of rhythm since Friday, lack  of CPAP due to power loss may be contributing to not self converting  He plans to get a generator today and get back on CPAP  Increase metoprolol to 1 1/2 tab bid but go back to usual dose am of cardioversion The pt was offered a sooner cardioversion this week  but he wanted to delay until more convenient for him   2. CHA2DS2VASc score of 7 No missed doses of eliquis 5 mg bid x at least 3 weeks   afib clinic f/u one week after cardioversion  Butch Penny C. Kimari Lienhard, [facility] [address] [place], [place] [address] [phone]

## 2019-09-07 NOTE — Patient Instructions (Addendum)
Cardioversion scheduled for Wednesday, February 24th  - Arrive at the [facility] and go to admitting at 8:00 AM  -Do not eat or drink anything after midnight the night prior to your procedure.  - Take all your morning medication (except diabetic medications) with a sip of water prior to arrival.  - You will not be able to drive home after your procedure.   Increase metoprolol to 1 and 1/2 tablets twice a day until day of cardioversion then reduce back to 1 tablet twice a day

## 2019-09-11 ENCOUNTER — Other Ambulatory Visit: Payer: Self-pay

## 2019-09-11 ENCOUNTER — Ambulatory Visit (HOSPITAL_COMMUNITY)
Admission: RE | Admit: 2019-09-11 | Discharge: 2019-09-11 | Disposition: A | Discharge status: Home / Self Care | Payer: Medicare Other | Source: Ambulatory Visit | Attending: Nurse Practitioner | Admitting: Nurse Practitioner

## 2019-09-11 VITALS — BP 150/86 | HR 80

## 2019-09-11 DIAGNOSIS — I480 Paroxysmal atrial fibrillation: Secondary | ICD-10-CM | POA: Insufficient documentation

## 2019-09-11 NOTE — Progress Notes (Signed)
Pt in to confirm that he was back in rhythm as he was set up for cardioversion. EKG shows SR at 80 bpm.  He is back taking 50 mg metoprolol bid instead of 1 1/2 bid. Back on Cpap as being off this 2/2 power failure  may have been the trigger for afib. Cardioversion cancelled.

## 2019-09-12 ENCOUNTER — Other Ambulatory Visit (HOSPITAL_COMMUNITY): Payer: Medicare Other

## 2019-09-16 ENCOUNTER — Encounter (HOSPITAL_COMMUNITY): Admission: RE | Payer: Self-pay | Source: Home / Self Care

## 2019-09-16 ENCOUNTER — Ambulatory Visit (HOSPITAL_COMMUNITY): Admission: RE | Admit: 2019-09-16 | Payer: Medicare Other | Source: Home / Self Care | Admitting: Internal Medicine

## 2019-09-16 SURGERY — CARDIOVERSION
Anesthesia: General

## 2019-09-17 ENCOUNTER — Ambulatory Visit (HOSPITAL_COMMUNITY): Payer: Medicare Other | Admitting: Nurse Practitioner

## 2019-09-21 ENCOUNTER — Other Ambulatory Visit: Payer: Self-pay | Admitting: Medical

## 2019-09-21 DIAGNOSIS — E118 Type 2 diabetes mellitus with unspecified complications: Secondary | ICD-10-CM

## 2019-09-22 NOTE — Progress Notes (Signed)
Virtual Visit via Telephone Note   This visit type was conducted due to national recommendations for restrictions regarding the COVID-19 Pandemic (e.g. social distancing) in an effort to limit this patient's exposure and mitigate transmission in our community.  Due to his co-morbid illnesses, this patient is at least at moderate risk for complications without adequate follow up.  This format is felt to be most appropriate for this patient at this time.  All issues noted in this document were discussed and addressed.  A limited physical exam was performed with this format.  Please refer to the patient's chart for his consent to telehealth for [facility].   Evaluation Performed:  Follow-up visit  This visit type was conducted due to national recommendations for restrictions regarding the COVID-19 Pandemic (e.g. social distancing).  This format is felt to be most appropriate for this patient at this time.  All issues noted in this document were discussed and addressed.  No physical exam was performed (except for noted visual exam findings with Video Visits).  Please refer to the patient's chart (MyChart message for video visits and phone note for telephone visits) for the patient's consent to telehealth for [facility].  Date:  09/23/2019   ID:  [name], DOB Dec 10, 1950, MRN [id number]  Patient Location:  Home  Provider location:   [place]  PCP:  Carlena Hurl, PA-C  Cardiologist:  Fransico Him, MD  Electrophysiologist:  None   Chief Complaint:  CAD, HTN, PAF, OSA  History of Present Illness:    [name] is a 69 y.o. male who presents via audio/video conferencing for a telehealth visit today.    [name] is a 69y.o. male with a hx of ASCADwith chronically occluded RCA s/p failed PCI with L>>R collaterals, HTN, OSAon CPAP, morbid obesity,persistentAFs/p afib ablation, atrial flutterunable to ablateat EP studyanddyslipidemia. He has had problems with  recurrent afib and is now followed by afib clinic.  He was seen in afib clinic 09/07/2019 due to having recurrent afib after electric went out and he could not use his PAP device.  At that [place] he was in atrial flutter at 150bpm.  His metoprolol was increased and he was back in NSR by EKG 09/11/2019.  He is now here for followup.    He is here today for followup and is doing well.  He denies any chest pain or pressure,  PND, orthopnea, LE edema, dizziness, palpitations or syncope. He has chronic DOE due to morbid obesity which is stable. He is compliant with his meds and is tolerating meds with no SE.  He is doing well with his CPAP device and thinks that he has gotten used to it.  He tolerates the mask and feels the pressure is adequate.  Since going on CPAP he feels rested in the am and has no significant daytime sleepiness.  He denies any significant mouth or nasal dryness or nasal congestion.  He does not think that he snores.    The patient does not have symptoms concerning for COVID-19 infection (fever, chills, cough, or new shortness of breath).   Prior CV studies:   The following studies were reviewed today:  PAP compliance download from [place], outside labs from PCP  Past Medical History:  Diagnosis Date  . Atrial flutter ([place])    a. atypical atrial flutter.  . Bell's palsy   . CAD (coronary artery disease)    a. 07/2009 Cath: LM nl, LAD nl, LCX nl w/ L->R collats, chronically occluded  RCA s/p failed PCI;  b. 07/2012 MV: basal inf mild ischemia.  . Central retinal artery occlusion, left eye    diagnosed in 2008  . Cerebrovascular accident [facility])    a. 03/2008 secondary to cardioembolic event;  b. chronic coumadin.  . Chronic diastolic CHF (congestive heart failure) ([place])    a. 12/2008 Echo: nl EF.  . Diabetes mellitus 11/14/2010   TYPE II  . Hematuria 05/2013   Urology, Dr. Estill Dooms  . Hyperlipidemia   . Hypertension   . Hypogonadism, male 05/2013   Dr. Estill Dooms, Urology  . Morbid obesity  ([place])   . Obesity   . Obstructive sleep apnea   . Persistent atrial fibrillation ([place])    a. s/p afib ablation by [facility] 08/01/08  . Recommendation refused by patient    multiple recommended vaccines refused over the years (flu/pneumovax)  . Skin infection    chronic left leg herpetic   Past Surgical History:  Procedure Laterality Date  . ATRIAL ABLATION SURGERY  08/01/08   CTI and PVI ablation by [facility]  . COLONOSCOPY     2012 per patient  . ELECTROPHYSIOLOGIC STUDY N/A 11/30/2014   Procedure: Atrial Fibrillation Ablation;  Surgeon: [name], MD;  Location: [place] CV LAB;  Service: Cardiovascular;  Laterality: N/A;  . TEE WITHOUT CARDIOVERSION N/A 11/30/2014   Procedure: TRANSESOPHAGEAL ECHOCARDIOGRAM (TEE);  Surgeon: Josue Hector, MD;  Location: [facility] ENDOSCOPY;  Service: Cardiovascular;  Laterality: N/A;     Current Meds  Medication Sig  . ACCU-CHEK AVIVA PLUS test strip USE 1 STRIP TO CHECK GLUCOSE TWICE DAILY  . ACCU-CHEK FASTCLIX LANCETS MISC Test blood sugar bid. Dx code E11.9  . amLODipine (NORVASC) 5 MG tablet Take 1 tablet by mouth once daily  . atorvastatin (LIPITOR) 80 MG tablet Take 1 tablet by mouth once daily  . b complex vitamins capsule Take 1 capsule by mouth every other day.   . Cholecalciferol (VITAMIN D3) 10000 UNITS capsule Take 1 capsule (10,000 Units total) by mouth daily.  [name] ELIQUIS 5 MG TABS tablet TAKE 1 TABLET BY MOUTH TWICE DAILY (APPOINTMENT  NEEDED  WITH  CARDIOLOGIST  FOR  FURTHER  REFILLS)  . insulin degludec (TRESIBA FLEXTOUCH) 100 UNIT/ML SOPN FlexTouch Pen INJECT 10 UNITS SUBCUTANEOUSLY ONCE DAILY AT  10  PM  AS  DIRECTED  . Insulin Pen Needle (BD PEN NEEDLE NANO U/F) 32G X 4 MM MISC 1 each by Does not apply route at bedtime.  . isosorbide mononitrate (IMDUR) 30 MG 24 hr tablet Take 1 tablet by mouth once daily  . losartan (COZAAR) 100 MG tablet Take 1 tablet by mouth once daily  . metoprolol tartrate (LOPRESSOR) 50 MG tablet Take 1 tablet (50 mg  total) by mouth 2 (two) times daily. MAY TAKE 1 EXTRA METOPROLOL IF YOUR HEART RATE IS 110 OR HIGHER  . omega-3 acid ethyl esters (LOVAZA) 1 g capsule Take 2 capsules (2 g total) by mouth 2 (two) times daily.  [name] PRALUENT 75 MG/ML SOAJ INJECT 1 PEN INTO THE SKIN EVERY 14 DAYS  . Semaglutide, 1 MG/DOSE, (OZEMPIC, 1 MG/DOSE,) 2 MG/1.5ML SOPN Inject 1 mg into the skin once a week.  . Testosterone (ANDROGEL PUMP) 12.5 MG/ACT (1%) GEL Place 2 application onto the skin daily.   . valACYclovir (VALTREX) 500 MG tablet Take 1 tablet by mouth once daily     Allergies:   Crestor [rosuvastatin], Zetia [ezetimibe], Lisinopril, and [place] [dapagliflozin]   Social History   Tobacco Use  .  Smoking status: Former Smoker    Quit date: 07/24/1979    Years since quitting: 40.1  . Smokeless tobacco: Never Used  Substance Use Topics  . Alcohol use: No    Comment: former  . Drug use: No    Comment: former     Family Hx: The patient's family history includes Heart disease in his father and mother; Stroke in his father.  ROS:   Please see the history of present illness.     All other systems reviewed and are negative.   Labs/Other Tests and Data Reviewed:    Recent Labs: 09/07/2019: BUN 12; Creatinine, Ser 0.95; Hemoglobin 15.8; Platelets 249; Potassium 4.4; Sodium 136   Recent Lipid Panel Lab Results  Component Value Date/Time   CHOL 119 08/25/2018 09:00 AM   TRIG 189 (H) 08/25/2018 09:00 AM   HDL 42 08/25/2018 09:00 AM   CHOLHDL 2.8 08/25/2018 09:00 AM   CHOLHDL 3.3 07/30/2017 10:50 AM   LDLCALC 39 08/25/2018 09:00 AM   LDLCALC 65 07/30/2017 10:50 AM   LDLDIRECT 87.7 05/08/2013 08:37 AM    Wt Readings from Last 3 Encounters:  09/23/19 281 lb (127.5 kg)  09/07/19 280 lb 3.2 oz (127.1 kg)  07/06/19 257 lb (116.6 kg)     Objective:    Vital Signs:  BP (!) 149/84   Pulse 82   Ht 5\' 11"  (1.803 m)   Wt 281 lb (127.5 kg)   BMI 39.19 kg/m     ASSESSMENT & PLAN:    1.  Persistent  atrial fibrillation -maintaining NSR on exam -denies any further palpitations -continue Lopressor 50mg  BID and Eliquis 5mg  BID -he denies any bleeding problems -outside labs reviewed and showed a Hbg of 15.8 and creatinine of 0.950 in Feb 2021   2.  Hypertension -BP borderline controlled - he just took his BP meds -continue amlodipine 5mg  daily, Losartan 100mg  daily and increase Lopressor 50mg  BID   3.  ASCAD  -hx of-chronically occluded RCA s/p failed PCI with L>>R collaterals.   -he denies any anginal sx -continue on Imdur 30mg  daily, statin and BB -no ASA due to DOAC  4.  Chronic diastolic CHF -he appears euvolemic on exam -he denies any LE edema and SOB is stable -he has not required any diuretics   5. OSA -   The patient is tolerating PAP therapy well without any problems. The PAP download was reviewed today and showed an AHI of 0.4/hr on 16 cm H2O with 93% compliance in using more than 4 hours nightly.  The patient has been using and benefiting from PAP use and will continue to benefit from therapy.   6.  Morbid Obesity  -I have encouraged him to get into a routine exercise program and cut back on carbs and portions.   7.  Hyperlipidemia  -his LDL goal is < 70 -his last LDL was 39 and ALT 20 on review of outside labs from PCP on [facility] in Feb 2020 -repeat FLP and ALT -continue on atorvastatin 80mg  daily  COVID-19 Education: The signs and symptoms of COVID-19 were discussed with the patient and how to seek care for testing (follow up with PCP or arrange E-visit).  The importance of social distancing was discussed today.  Patient Risk:   After full review of this patient's clinical status, I feel that they are at least moderate risk at this time.  Time:   Today, I have spent 20 minutes on telemedicine discussing medical problems including CAD, HLD,  HTN, OSA, afib, CHF and reviewing patient's chart including outside labs from PCP on [facility], PAP compliance download on  [facility].  Medication Adjustments/Labs and Tests Ordered: Current medicines are reviewed at length with the patient today.  Concerns regarding medicines are outlined above.  Tests Ordered: No orders of the defined types were placed in this encounter.  Medication Changes: No orders of the defined types were placed in this encounter.   Disposition:  Follow up in 6 month(s)  Signed, Fransico Him, MD  09/23/2019 9:09 AM    [facility]

## 2019-09-23 ENCOUNTER — Telehealth (INDEPENDENT_AMBULATORY_CARE_PROVIDER_SITE_OTHER): Payer: Medicare Other | Admitting: Cardiology

## 2019-09-23 ENCOUNTER — Encounter: Payer: Self-pay | Admitting: Cardiology

## 2019-09-23 ENCOUNTER — Ambulatory Visit (HOSPITAL_COMMUNITY): Payer: Medicare Other | Admitting: Nurse Practitioner

## 2019-09-23 ENCOUNTER — Other Ambulatory Visit: Payer: Self-pay

## 2019-09-23 VITALS — BP 149/84 | HR 82 | Ht 71.0 in | Wt 281.0 lb

## 2019-09-23 DIAGNOSIS — I5032 Chronic diastolic (congestive) heart failure: Secondary | ICD-10-CM

## 2019-09-23 DIAGNOSIS — Z87891 Personal history of nicotine dependence: Secondary | ICD-10-CM

## 2019-09-23 DIAGNOSIS — E782 Mixed hyperlipidemia: Secondary | ICD-10-CM | POA: Diagnosis not present

## 2019-09-23 DIAGNOSIS — Z6839 Body mass index (BMI) 39.0-39.9, adult: Secondary | ICD-10-CM | POA: Diagnosis not present

## 2019-09-23 DIAGNOSIS — I110 Hypertensive heart disease with heart failure: Secondary | ICD-10-CM | POA: Diagnosis not present

## 2019-09-23 DIAGNOSIS — G4733 Obstructive sleep apnea (adult) (pediatric): Secondary | ICD-10-CM

## 2019-09-23 DIAGNOSIS — I2510 Atherosclerotic heart disease of native coronary artery without angina pectoris: Secondary | ICD-10-CM | POA: Diagnosis not present

## 2019-09-23 DIAGNOSIS — I10 Essential (primary) hypertension: Secondary | ICD-10-CM

## 2019-09-23 DIAGNOSIS — I4819 Other persistent atrial fibrillation: Secondary | ICD-10-CM

## 2019-09-23 NOTE — Patient Instructions (Signed)
Medication Instructions:  Your physician recommends that you continue on your current medications as directed. Please refer to the Current Medication list given to you today.  *If you need a refill on your cardiac medications before your next appointment, please call your pharmacy*   Lab Work: Fasting lipids and ALT.  If you have labs (blood work) drawn today and your tests are completely normal, you will receive your results only by: [name] MyChart Message (if you have MyChart) OR . A paper copy in the mail If you have any lab test that is abnormal or we need to change your treatment, we will call you to review the results.  Follow-Up: At [facility], you and your health needs are our priority.  As part of our continuing mission to provide you with exceptional heart care, we have created designated Provider Care Teams.  These Care Teams include your primary Cardiologist (physician) and Advanced Practice Providers (APPs -  Physician Assistants and Nurse Practitioners) who all work together to provide you with the care you need, when you need it.  We recommend signing up for the patient portal called "MyChart".  Sign up information is provided on this After Visit Summary.  MyChart is used to connect with patients for Virtual Visits (Telemedicine).  Patients are able to view lab/test results, encounter notes, upcoming appointments, etc.  Non-urgent messages can be sent to your provider as well.   To learn more about what you can do with MyChart, go to [email or web address].    Your next appointment:   6 month(s)  The format for your next appointment:   Either In Person or Virtual  Provider:   Fransico Him, MD

## 2019-09-24 ENCOUNTER — Other Ambulatory Visit: Payer: Medicare Other | Admitting: *Deleted

## 2019-09-24 ENCOUNTER — Other Ambulatory Visit: Payer: Self-pay

## 2019-09-24 DIAGNOSIS — E782 Mixed hyperlipidemia: Secondary | ICD-10-CM

## 2019-09-24 LAB — LIPID PANEL
Chol/HDL Ratio: 2.5 ratio (ref 0.0–5.0)
Cholesterol, Total: 99 mg/dL — ABNORMAL LOW (ref 100–199)
HDL: 40 mg/dL (ref >39)
LDL Chol Calc (NIH): 33 mg/dL (ref 0–99)
Triglycerides: 158 mg/dL — ABNORMAL HIGH (ref 0–149)
VLDL Cholesterol Cal: 26 mg/dL (ref 5–40)

## 2019-09-24 LAB — ALT: ALT: 19 IU/L (ref 0–44)

## 2019-10-08 ENCOUNTER — Other Ambulatory Visit: Payer: Self-pay | Admitting: Cardiology

## 2019-10-11 IMAGING — CR DG CHEST 2V
2 series · 2 of 2 positions shown · non-contrast
Comparison: 10/29/2014

CLINICAL DATA: Atrial fibrillation

EXAM:
CHEST - 2 VIEW

[chest pa]
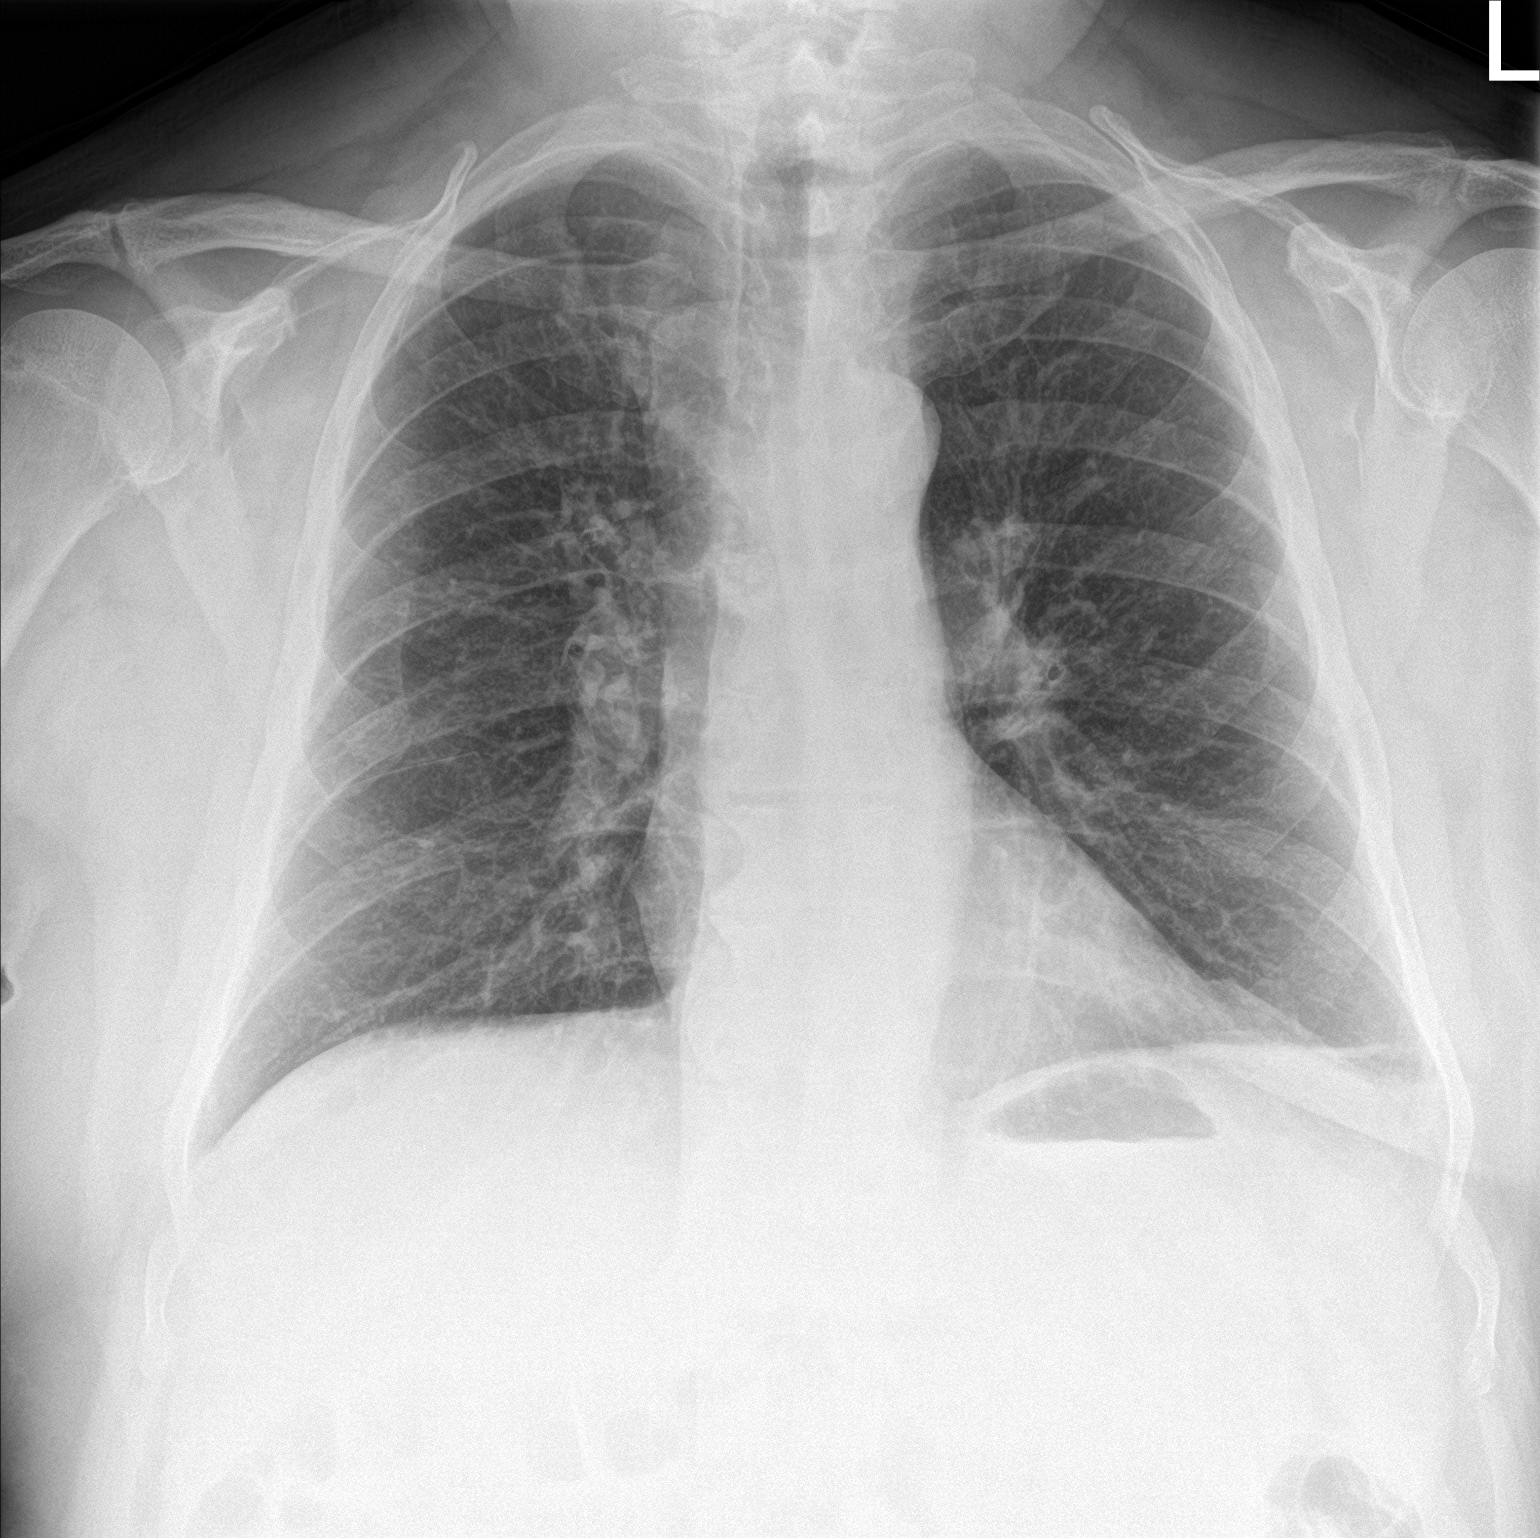

[chest lat]
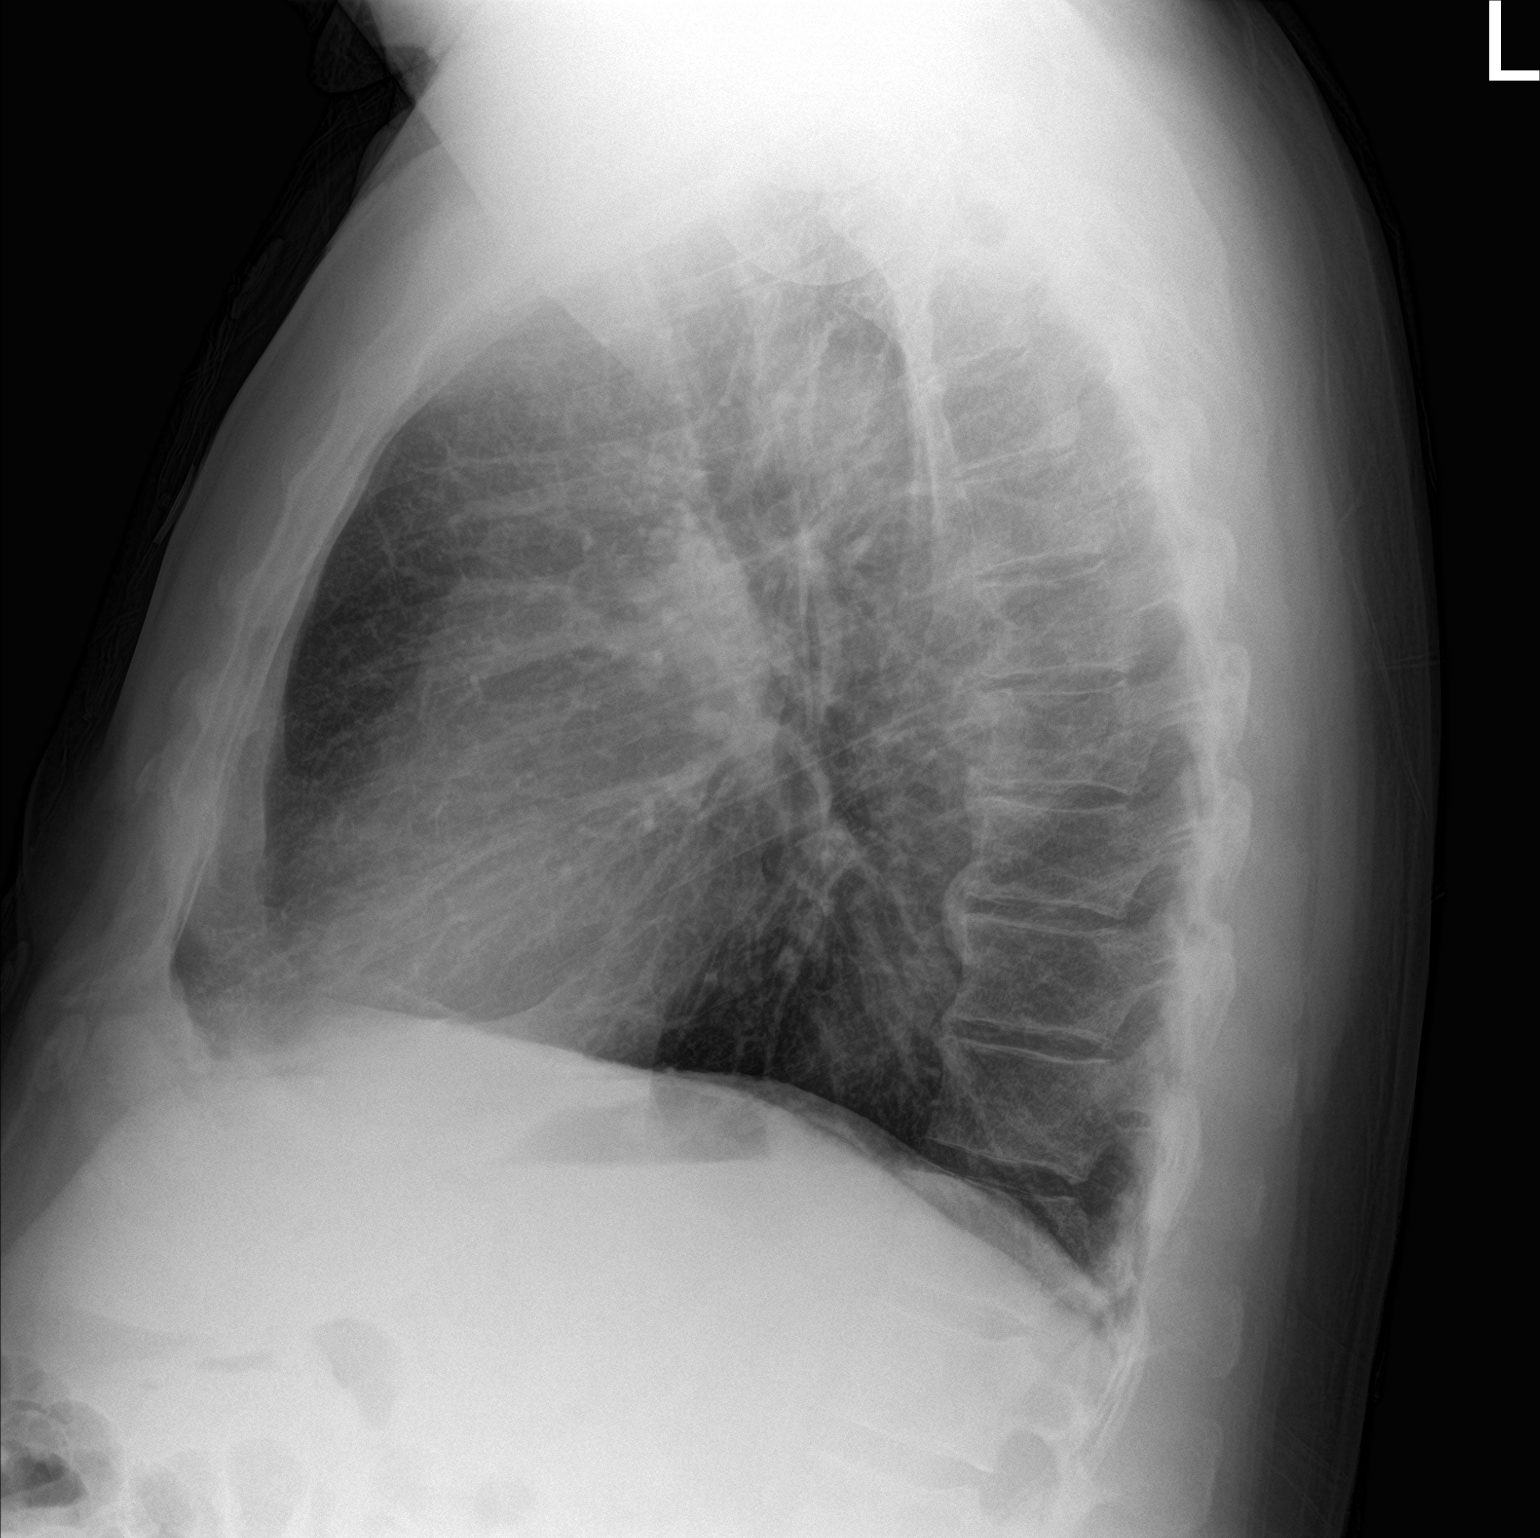

[2 of 2 positions shown; findings below may reference images not displayed]

FINDINGS: Normal heart size, mediastinal contours, and pulmonary vascularity.

Mild peribronchial thickening.

Linear subsegmental atelectasis at LEFT base.

No pulmonary infiltrate, pleural effusion, or pneumothorax.

Bones unremarkable.
IMPRESSION: Bronchitic changes with linear subsegmental atelectasis at LEFT
base.

## 2019-10-18 ENCOUNTER — Other Ambulatory Visit: Payer: Self-pay | Admitting: Medical

## 2019-10-20 ENCOUNTER — Other Ambulatory Visit: Payer: Self-pay

## 2019-10-20 ENCOUNTER — Other Ambulatory Visit: Payer: Medicare Other

## 2019-10-20 DIAGNOSIS — E291 Testicular hypofunction: Secondary | ICD-10-CM

## 2019-10-21 LAB — TESTOSTERONE: Testosterone: 826 ng/dL (ref 264–916)

## 2019-10-27 ENCOUNTER — Other Ambulatory Visit: Payer: Self-pay

## 2019-10-27 ENCOUNTER — Ambulatory Visit (INDEPENDENT_AMBULATORY_CARE_PROVIDER_SITE_OTHER): Payer: Medicare Other | Admitting: Medical

## 2019-10-27 ENCOUNTER — Encounter: Payer: Self-pay | Admitting: Medical

## 2019-10-27 VITALS — BP 120/76 | HR 97 | Temp 98.0°F | Ht 71.0 in | Wt 280.0 lb

## 2019-10-27 DIAGNOSIS — E118 Type 2 diabetes mellitus with unspecified complications: Secondary | ICD-10-CM | POA: Diagnosis not present

## 2019-10-27 DIAGNOSIS — E291 Testicular hypofunction: Secondary | ICD-10-CM | POA: Diagnosis not present

## 2019-10-27 DIAGNOSIS — I2510 Atherosclerotic heart disease of native coronary artery without angina pectoris: Secondary | ICD-10-CM

## 2019-10-27 DIAGNOSIS — L989 Disorder of the skin and subcutaneous tissue, unspecified: Secondary | ICD-10-CM

## 2019-10-27 DIAGNOSIS — E782 Mixed hyperlipidemia: Secondary | ICD-10-CM

## 2019-10-27 DIAGNOSIS — I10 Essential (primary) hypertension: Secondary | ICD-10-CM

## 2019-10-27 NOTE — Progress Notes (Addendum)
Subjective: Chief Complaint  Patient presents with  . Diabetes   Diabetes - doing ok, but numbers sometimes elevated with his sweet tea.  Using Tresiba 10u nightly.   Using weekly Ozempic.  Usually seeing 140-155 fasting glucose.   Drinks some starbucks sweet coffee.  Drinks some water.   Exercise - with some walking.   No foot concerns  Driving [employer] and [employer], and sometimes has to walk up 4 flights of stairs.     Has some dry skin spots on back he wants looked at.  Compliant with BP and cholesterol medicaiton per cardiology.  just had recent labs through them.    Past Medical History:  Diagnosis Date  . Atrial flutter ([place])    a. atypical atrial flutter.  . Bell's palsy   . CAD (coronary artery disease)    a. 07/2009 Cath: LM nl, LAD nl, LCX nl w/ L->R collats, chronically occluded RCA s/p failed PCI;  b. 07/2012 MV: basal inf mild ischemia.  . Central retinal artery occlusion, left eye    diagnosed in 2008  . Cerebrovascular accident [facility])    a. 03/2008 secondary to cardioembolic event;  b. chronic coumadin.  . Chronic diastolic CHF (congestive heart failure) ([place])    a. 12/2008 Echo: nl EF.  . Diabetes mellitus 11/14/2010   TYPE II  . Hematuria 05/2013   Urology, Dr. Estill Dooms  . Hyperlipidemia   . Hypertension   . Hypogonadism, male 05/2013   Dr. Estill Dooms, Urology  . Morbid obesity ([place])   . Obesity   . Obstructive sleep apnea   . Persistent atrial fibrillation ([place])    a. s/p afib ablation by [facility] 08/01/08  . Recommendation refused by patient    multiple recommended vaccines refused over the years (flu/pneumovax)  . Skin infection    chronic left leg herpetic   Current Outpatient Medications on File Prior to Visit  Medication Sig Dispense Refill  . ACCU-CHEK AVIVA PLUS test strip USE 1 STRIP TO CHECK GLUCOSE TWICE DAILY 50 each 0  . ACCU-CHEK FASTCLIX LANCETS MISC Test blood sugar bid. Dx code E11.9 102 each 1  . amLODipine (NORVASC) 5 MG tablet Take 1 tablet by mouth once  daily 90 tablet 2  . atorvastatin (LIPITOR) 80 MG tablet Take 1 tablet by mouth once daily 90 tablet 0  . b complex vitamins capsule Take 1 capsule by mouth every other day.     . Cholecalciferol (VITAMIN D3) 10000 UNITS capsule Take 1 capsule (10,000 Units total) by mouth daily. 4 capsule 0  . ELIQUIS 5 MG TABS tablet TAKE 1 TABLET BY MOUTH TWICE DAILY (APPOINTMENT  NEEDED  WITH  CARDIOLOGIST  FOR  FURTHER  REFILLS) 180 tablet 0  . insulin degludec (TRESIBA FLEXTOUCH) 100 UNIT/ML SOPN FlexTouch Pen INJECT 10 UNITS SUBCUTANEOUSLY ONCE DAILY AT  10  PM  AS  DIRECTED 15 mL 5  . Insulin Pen Needle (BD PEN NEEDLE NANO U/F) 32G X 4 MM MISC 1 each by Does not apply route at bedtime. 100 each 11  . isosorbide mononitrate (IMDUR) 30 MG 24 hr tablet Take 1 tablet by mouth once daily 90 tablet 2  . losartan (COZAAR) 100 MG tablet Take 1 tablet by mouth once daily 90 tablet 2  . metoprolol tartrate (LOPRESSOR) 50 MG tablet Take 1 tablet (50 mg total) by mouth 2 (two) times daily. MAY TAKE 1 EXTRA METOPROLOL IF YOUR HEART RATE IS 110 OR HIGHER 200 tablet 3  . omega-3 acid  ethyl esters (LOVAZA) 1 g capsule Take 2 capsules by mouth twice daily 360 capsule 0  . PRALUENT 75 MG/ML SOAJ INJECT 1 PEN INTO THE SKIN EVERY 14 DAYS 2 pen 11  . Semaglutide, 1 MG/DOSE, (OZEMPIC, 1 MG/DOSE,) 2 MG/1.5ML SOPN Inject 1 mg into the skin once a week. 1.5 mL 5  . Testosterone (ANDROGEL PUMP) 12.5 MG/ACT (1%) GEL Place 2 application onto the skin daily.     . valACYclovir (VALTREX) 500 MG tablet Take 1 tablet by mouth once daily 90 tablet 0   No current facility-administered medications on file prior to visit.   ROS as in subjective     Objective: BP 120/76   Pulse 97   Temp 98 F (36.7 C)   Ht 5\' 11"  (1.803 m)   Wt 280 lb (127 kg)   SpO2 98%   BMI 39.05 kg/m   Wt Readings from Last 3 Encounters:  10/27/19 280 lb (127 kg)  09/23/19 281 lb (127.5 kg)  09/07/19 280 lb 3.2 oz (127.1 kg)   General appearence:  alert, no distress, WD/WN Neck: supple, no lymphadenopathy, no thyromegaly, no masses, no bruits Heart: RRR, normal S1, S2, no murmurs Lungs: CTA bilaterally, no wheezes, rhonchi, or rales Abdomen: +bs, soft, non tender, non distended, no masses, no hepatomegaly, no splenomegaly Pulses: 2+ symmetric, upper and lower extremities, normal cap refill Skin: Several small slightly raised rough 2 to 3 mm diameter lesions scattered on back, forearms suggestive of actinic keratoses   Diabetic Foot Exam - Simple   Simple Foot Form Diabetic Foot exam was performed with the following findings: Yes 10/27/2019  1:01 PM  Visual Inspection No deformities, no ulcerations, no other skin breakdown bilaterally: Yes Sensation Testing Intact to touch and monofilament testing bilaterally: Yes Pulse Check Posterior Tibialis and Dorsalis pulse intact bilaterally: Yes Comments      Assessment: Encounter Diagnoses  Name Primary?  . Diabetes mellitus with complication ([place]) Yes  . Essential hypertension, benign   . Coronary artery disease involving native coronary artery of native heart without angina pectoris   . Hypogonadism male   . Mixed dyslipidemia   . Morbid obesity ([place])   . Skin lesion       Plan: Diabetes-labs as below.  Continue current medications.  Continue glucose monitoring.  Counseled on diet and exercise.  Counseled on need to lose weight.   Offered referral to [employer] study for weight loss and diabetes but he declines.  Hypertension, dyslipidemia-managed by cardiology.  I reviewed recent labs from March 2021 in the chart record  Hypogonadism-recent testosterone in goal range.  Continue current therapy.  PSA screen today.  Unfortunately we didn't draw enough blood before he left. Plan to check when available.   Obesity-counseled on the need to lose weight  Skin lesions-likely AK's.  Referral to dermatology for surveillance and evaluation  Kailen was seen today for  diabetes.  Diagnoses and all orders for this visit:  Diabetes mellitus with complication ([place]) -     Hemoglobin A1c -     Microalbumin/Creatinine Ratio, Urine  Essential hypertension, benign -     Microalbumin/Creatinine Ratio, Urine  Coronary artery disease involving native coronary artery of native heart without angina pectoris  Hypogonadism male -     Cancel: PSA  Mixed dyslipidemia  Morbid obesity ([place])  Skin lesion -     Ambulatory referral to Dermatology   Follow-up pending labs, 6 months

## 2019-10-27 NOTE — Addendum Note (Signed)
Addended by: Carlena Hurl on: 10/27/2019 01:14 PM   Modules accepted: Orders

## 2019-10-27 NOTE — Patient Instructions (Signed)
Try 90/10 sweet tea, 90% Unsweet!!  Walk regularly  We will refer to dermatology

## 2019-10-28 ENCOUNTER — Other Ambulatory Visit: Payer: Self-pay | Admitting: Medical

## 2019-10-28 LAB — HEMOGLOBIN A1C
Est. average glucose Bld gHb Est-mCnc: 169 mg/dL
Hgb A1c MFr Bld: 7.5 % — ABNORMAL HIGH (ref 4.8–5.6)

## 2019-10-28 LAB — MICROALBUMIN / CREATININE URINE RATIO
Creatinine, Urine: 219.4 mg/dL
Microalb/Creat Ratio: 25 mg/g creat (ref 0–29)
Microalbumin, Urine: 54.1 ug/mL

## 2019-10-28 MED ORDER — BD PEN NEEDLE NANO U/F 32G X 4 MM MISC: 1.0000 | Freq: Every day | 11 refills | Status: DC

## 2019-10-28 MED ORDER — ATORVASTATIN CALCIUM 80 MG PO TABS: 80.0000 mg | ORAL_TABLET | Freq: Every day | ORAL | 3 refills | Status: DC

## 2019-10-28 MED ORDER — OZEMPIC (1 MG/DOSE) 2 MG/1.5ML ~~LOC~~ SOPN: 1.0000 mg | PEN_INJECTOR | SUBCUTANEOUS | 5 refills | Status: DC

## 2019-10-28 MED ORDER — TRESIBA FLEXTOUCH 100 UNIT/ML ~~LOC~~ SOPN: 15.0000 [IU] | PEN_INJECTOR | Freq: Every day | SUBCUTANEOUS | 5 refills | Status: DC

## 2019-10-30 ENCOUNTER — Other Ambulatory Visit: Payer: Self-pay | Admitting: Medical

## 2019-10-30 MED ORDER — TRESIBA FLEXTOUCH 100 UNIT/ML ~~LOC~~ SOPN: 15.0000 [IU] | PEN_INJECTOR | Freq: Every day | SUBCUTANEOUS | 5 refills | Status: DC

## 2019-11-02 ENCOUNTER — Other Ambulatory Visit: Payer: Self-pay | Admitting: Cardiology

## 2019-11-02 NOTE — Telephone Encounter (Signed)
Pt last saw Dr Radford Pax 09/23/19 telemedicine Covid-19, last labs 09/07/19 Creat 0.95, age 69, weight 127kg, based on specified criteria pt is on appropriate dosage of Eliquis 5mg  BID.  Will refill rx.

## 2019-11-03 ENCOUNTER — Other Ambulatory Visit: Payer: Self-pay | Admitting: Medical

## 2019-12-15 DIAGNOSIS — L821 Other seborrheic keratosis: Secondary | ICD-10-CM | POA: Diagnosis not present

## 2019-12-15 DIAGNOSIS — L570 Actinic keratosis: Secondary | ICD-10-CM | POA: Diagnosis not present

## 2019-12-15 DIAGNOSIS — R202 Paresthesia of skin: Secondary | ICD-10-CM | POA: Diagnosis not present

## 2019-12-15 DIAGNOSIS — D485 Neoplasm of uncertain behavior of skin: Secondary | ICD-10-CM | POA: Diagnosis not present

## 2019-12-15 DIAGNOSIS — L708 Other acne: Secondary | ICD-10-CM | POA: Diagnosis not present

## 2019-12-15 DIAGNOSIS — L814 Other melanin hyperpigmentation: Secondary | ICD-10-CM | POA: Diagnosis not present

## 2020-01-04 ENCOUNTER — Encounter: Payer: Self-pay | Admitting: Medical

## 2020-01-04 ENCOUNTER — Other Ambulatory Visit: Payer: Self-pay | Admitting: Medical

## 2020-01-04 DIAGNOSIS — E118 Type 2 diabetes mellitus with unspecified complications: Secondary | ICD-10-CM

## 2020-01-28 ENCOUNTER — Other Ambulatory Visit: Payer: Self-pay | Admitting: Medical

## 2020-02-02 ENCOUNTER — Other Ambulatory Visit: Payer: Self-pay | Admitting: Medical

## 2020-02-29 DIAGNOSIS — L578 Other skin changes due to chronic exposure to nonionizing radiation: Secondary | ICD-10-CM | POA: Diagnosis not present

## 2020-02-29 DIAGNOSIS — L820 Inflamed seborrheic keratosis: Secondary | ICD-10-CM | POA: Diagnosis not present

## 2020-02-29 DIAGNOSIS — L280 Lichen simplex chronicus: Secondary | ICD-10-CM | POA: Diagnosis not present

## 2020-02-29 DIAGNOSIS — L720 Epidermal cyst: Secondary | ICD-10-CM | POA: Diagnosis not present

## 2020-03-19 ENCOUNTER — Other Ambulatory Visit: Payer: Self-pay | Admitting: Cardiology

## 2020-03-29 ENCOUNTER — Other Ambulatory Visit: Payer: Self-pay | Admitting: Pharmacist

## 2020-03-29 MED ORDER — PRALUENT 75 MG/ML ~~LOC~~ SOAJ: SUBCUTANEOUS | 11 refills | Status: DC

## 2020-05-03 ENCOUNTER — Other Ambulatory Visit: Payer: Self-pay | Admitting: Medical

## 2020-05-03 DIAGNOSIS — E118 Type 2 diabetes mellitus with unspecified complications: Secondary | ICD-10-CM

## 2020-05-04 ENCOUNTER — Other Ambulatory Visit: Payer: Self-pay

## 2020-05-04 ENCOUNTER — Encounter (INDEPENDENT_AMBULATORY_CARE_PROVIDER_SITE_OTHER): Payer: Medicare Other | Admitting: Ophthalmology

## 2020-05-04 ENCOUNTER — Other Ambulatory Visit: Payer: Self-pay | Admitting: Medical

## 2020-05-04 ENCOUNTER — Other Ambulatory Visit: Payer: Self-pay | Admitting: Cardiology

## 2020-05-04 DIAGNOSIS — I10 Essential (primary) hypertension: Secondary | ICD-10-CM | POA: Diagnosis not present

## 2020-05-04 DIAGNOSIS — H43813 Vitreous degeneration, bilateral: Secondary | ICD-10-CM

## 2020-05-04 DIAGNOSIS — H35033 Hypertensive retinopathy, bilateral: Secondary | ICD-10-CM

## 2020-05-04 DIAGNOSIS — H353122 Nonexudative age-related macular degeneration, left eye, intermediate dry stage: Secondary | ICD-10-CM

## 2020-05-04 DIAGNOSIS — E113291 Type 2 diabetes mellitus with mild nonproliferative diabetic retinopathy without macular edema, right eye: Secondary | ICD-10-CM

## 2020-05-04 DIAGNOSIS — E11319 Type 2 diabetes mellitus with unspecified diabetic retinopathy without macular edema: Secondary | ICD-10-CM

## 2020-05-04 DIAGNOSIS — H34812 Central retinal vein occlusion, left eye, with macular edema: Secondary | ICD-10-CM

## 2020-05-04 LAB — HM DIABETES EYE EXAM

## 2020-05-04 NOTE — Telephone Encounter (Signed)
Prescription refill request for Eliquis received.  Last office visit: [place], 09/23/2019 Scr: 0.95, 09/07/2019 Age: 69 y.o. Weight: 127 kg   Prescription refill sent.

## 2020-05-18 ENCOUNTER — Other Ambulatory Visit: Payer: Self-pay

## 2020-05-18 DIAGNOSIS — E118 Type 2 diabetes mellitus with unspecified complications: Secondary | ICD-10-CM

## 2020-05-18 MED ORDER — ACCU-CHEK AVIVA PLUS VI STRP: ORAL_STRIP | 1 refills | Status: DC

## 2020-06-05 ENCOUNTER — Other Ambulatory Visit: Payer: Self-pay | Admitting: Cardiology

## 2020-06-29 ENCOUNTER — Other Ambulatory Visit: Payer: Self-pay | Admitting: Medical

## 2020-06-29 NOTE — Telephone Encounter (Signed)
Is this appropriate?  

## 2020-06-29 NOTE — Telephone Encounter (Signed)
Due for appt.  Make sure on schedule soon , fasting.   Medicare welness if due  Can refill 30 day supply to get in

## 2020-07-11 DIAGNOSIS — D485 Neoplasm of uncertain behavior of skin: Secondary | ICD-10-CM | POA: Diagnosis not present

## 2020-07-11 DIAGNOSIS — L723 Sebaceous cyst: Secondary | ICD-10-CM | POA: Diagnosis not present

## 2020-07-11 DIAGNOSIS — L570 Actinic keratosis: Secondary | ICD-10-CM | POA: Diagnosis not present

## 2020-07-11 DIAGNOSIS — L218 Other seborrheic dermatitis: Secondary | ICD-10-CM | POA: Diagnosis not present

## 2020-07-11 DIAGNOSIS — D225 Melanocytic nevi of trunk: Secondary | ICD-10-CM | POA: Diagnosis not present

## 2020-07-21 ENCOUNTER — Other Ambulatory Visit: Payer: Self-pay | Admitting: Cardiology

## 2020-07-27 ENCOUNTER — Telehealth (INDEPENDENT_AMBULATORY_CARE_PROVIDER_SITE_OTHER): Payer: Medicare Other | Admitting: Medical

## 2020-07-27 ENCOUNTER — Other Ambulatory Visit: Payer: Self-pay

## 2020-07-27 ENCOUNTER — Encounter: Payer: Self-pay | Admitting: Medical

## 2020-07-27 ENCOUNTER — Other Ambulatory Visit (INDEPENDENT_AMBULATORY_CARE_PROVIDER_SITE_OTHER): Payer: Medicare Other

## 2020-07-27 VITALS — Ht 70.0 in | Wt 285.0 lb

## 2020-07-27 DIAGNOSIS — Z20822 Contact with and (suspected) exposure to covid-19: Secondary | ICD-10-CM

## 2020-07-27 DIAGNOSIS — R059 Cough, unspecified: Secondary | ICD-10-CM | POA: Diagnosis not present

## 2020-07-27 DIAGNOSIS — R0981 Nasal congestion: Secondary | ICD-10-CM

## 2020-07-27 LAB — POC COVID19 BINAXNOW: SARS Coronavirus 2 Ag: NEGATIVE

## 2020-07-27 NOTE — Progress Notes (Signed)
Subjective:     Patient ID: [name], male   DOB: 1951-03-12, 70 y.o.   MRN: [id number]  This visit type was conducted due to national recommendations for restrictions regarding the COVID-19 Pandemic (e.g. social distancing) in an effort to limit this patient's exposure and mitigate transmission in our community.  Due to their co-morbid illnesses, this patient is at least at moderate risk for complications without adequate follow up.  This format is felt to be most appropriate for this patient at this time.    Documentation for virtual audio and video telecommunications through [place] encounter:  The patient was located at home. The provider was located in the office. The patient did consent to this visit and is aware of possible charges through their insurance for this visit.  The other persons participating in this telemedicine service were none. Time spent on call was 20 minutes and in review of previous records 20 minutes total.  This virtual service is not related to other E/M service within previous 7 days.   HPI Chief Complaint  Patient presents with  . URI    Itchy throat, cough and low grade fever. Symptoms started 07/25/20 night. Has not been tested for COVID. COVID exposure.   Virtual consult today for illness that started 2 nights ago.  Symptoms include cough, low-grade fever, itchy throat.  No NVD, no SOB or wheezing.  Some chills.  No body aches.  No headache, no head congestion.   He was around somebody 3-4 days ago that called him yesterday and reported + covid test.  Using some nyquil, vitamins D, C, zinc.  No other aggravating or relieving factors. No other complaint.  He has not had the covid vaccine.   Past Medical History:  Diagnosis Date  . Atrial flutter ([facility])    a. atypical atrial flutter.  . Bell's palsy   . CAD (coronary artery disease)    a. 07/2009 Cath: LM nl, LAD nl, LCX nl w/ L->R collats, chronically occluded RCA s/p failed PCI;  b. 07/2012 MV:  basal inf mild ischemia.  . Central retinal artery occlusion, left eye    diagnosed in 2008  . Cerebrovascular accident [facility])    a. 03/2008 secondary to cardioembolic event;  b. chronic coumadin.  . Chronic diastolic CHF (congestive heart failure) ([facility])    a. 12/2008 Echo: nl EF.  . Diabetes mellitus 11/14/2010   TYPE II  . Hematuria 05/2013   Urology, Dr. Lindley MagnusEskew  . Hyperlipidemia   . Hypertension   . Hypogonadism, male 05/2013   Dr. Lindley MagnusEskew, Urology  . Morbid obesity ([facility])   . Obesity   . Obstructive sleep apnea   . Persistent atrial fibrillation ([facility])    a. [facility]  . Recommendation refused by patient    multiple recommended vaccines refused over the years (flu/pneumovax)  . Skin infection    chronic left leg herpetic   Current Outpatient Medications on File Prior to Visit  Medication Sig Dispense Refill  . ACCU-CHEK FASTCLIX LANCETS MISC Test blood sugar bid. Dx code E11.9 102 each 1  . Alirocumab (PRALUENT) 75 MG/ML SOAJ INJECT THE CONTENTS OF 1 PEN INTO THE SKIN EVERY 14 DAYS 2 mL 11  . amLODipine (NORVASC) 5 MG tablet Take 1 tablet by mouth once daily 90 tablet 3  . atorvastatin (LIPITOR) 80 MG tablet Take 1 tablet (80 mg total) by mouth daily. 90 tablet 3  . b complex vitamins capsule Take 1 capsule by mouth  every other day.     . BD PEN NEEDLE NANO 2ND GEN 32G X 4 MM MISC USE 1 PEN NEEDLE AT BEDTIME 100 each 0  . Cholecalciferol (VITAMIN D3) 10000 UNITS capsule Take 1 capsule (10,000 Units total) by mouth daily. 4 capsule 0  . ELIQUIS 5 MG TABS tablet Take 1 tablet by mouth twice daily 180 tablet 1  . glucose blood (ACCU-CHEK AVIVA PLUS) test strip Use as instructed 180 each 1  . insulin degludec (TRESIBA FLEXTOUCH) 100 UNIT/ML FlexTouch Pen Inject 0.15 mLs (15 Units total) into the skin at bedtime. 15 mL 5  . isosorbide mononitrate (IMDUR) 30 MG 24 hr tablet Take 1 tablet by mouth once daily 90 tablet 1  . losartan (COZAAR) 100 MG tablet Take 1 tablet  by mouth once daily 90 tablet 1  . metoprolol tartrate (LOPRESSOR) 50 MG tablet Take 1 tablet (50 mg total) by mouth 2 (two) times daily. MAY TAKE 1 EXTRA METOPROLOL IF YOUR HEART RATE IS 110 OR HIGHER 200 tablet 3  . omega-3 acid ethyl esters (LOVAZA) 1 g capsule Take 2 capsules by mouth twice daily 360 capsule 0  . Semaglutide, 1 MG/DOSE, (OZEMPIC, 1 MG/DOSE,) 2 MG/1.5ML SOPN Inject 1 mg into the skin once a week. 1.5 mL 5  . Testosterone (ANDROGEL PUMP) 12.5 MG/ACT (1%) GEL Place 2 application onto the skin daily.    . valACYclovir (VALTREX) 500 MG tablet Take 1 tablet by mouth once daily 90 tablet 0  . OZEMPIC, 1 MG/DOSE, 4 MG/3ML SOPN INJECT 1 MG INTO THE SKIN ONCE A WEEK (Patient not taking: Reported on 07/27/2020) 3 mL 0   No current facility-administered medications on file prior to visit.     Review of Systems As in subjective    Objective:   Physical Exam Due to coronavirus pandemic stay at home measures, patient visit was virtual and they were not examined in person.   Ht 5\' 10"  (1.778 m)   Wt 285 lb (129.3 kg)   BMI 40.89 kg/m   Gen: wd, wn, nad, currently mildly ill-appearing Some coughing, no obvious dyspnea or wheezing      Assessment:     Encounter Diagnoses  Name Primary?  . Cough Yes  . Close exposure to COVID-19 virus   . Head congestion        Plan:     We discussed his COVID exposure, symptoms, and he will report for testing today in our back parking lot  He has not been COVID vaccinated  We discussed supportive care, rest, hydration, the vitamins and over-the-counter cold and flu medicine he is already taking  We discussed the potential for complications  If positive we will refer for monoclonal antibody infusion therapy   [name] was seen today for uri.  Diagnoses and all orders for this visit:  Cough -     POC COVID-19 BinaxNow; Future -     Novel Coronavirus, NAA ([facility]); Future  Close exposure to COVID-19 virus -     POC COVID-19  BinaxNow; Future -     Novel Coronavirus, NAA ([facility]); Future  Head congestion -     POC COVID-19 BinaxNow; Future -     Novel Coronavirus, NAA ([facility]); Future     Follow-up in our back parking lot today for Covid testing as above

## 2020-07-29 LAB — NOVEL CORONAVIRUS, NAA: SARS-CoV-2, NAA: DETECTED — AB

## 2020-07-29 LAB — SARS-COV-2, NAA 2 DAY TAT

## 2020-07-30 ENCOUNTER — Other Ambulatory Visit: Payer: Self-pay | Admitting: Family Medicine

## 2020-07-30 MED ORDER — ALBUTEROL SULFATE HFA 108 (90 BASE) MCG/ACT IN AERS: 2.0000 | INHALATION_SPRAY | Freq: Four times a day (QID) | RESPIRATORY_TRACT | 0 refills | Status: DC | PRN

## 2020-07-30 NOTE — Progress Notes (Signed)
He was diagnosed with Covid and is now slightly wheezing. I will call in an inhaler but warned him that if he gets worse,to go to the ER

## 2020-07-31 ENCOUNTER — Other Ambulatory Visit: Payer: Self-pay | Admitting: Medical

## 2020-08-04 ENCOUNTER — Other Ambulatory Visit: Payer: Self-pay | Admitting: Medical

## 2020-08-12 ENCOUNTER — Encounter: Payer: Self-pay | Admitting: Medical

## 2020-08-17 DIAGNOSIS — N400 Enlarged prostate without lower urinary tract symptoms: Secondary | ICD-10-CM | POA: Diagnosis not present

## 2020-08-17 DIAGNOSIS — E291 Testicular hypofunction: Secondary | ICD-10-CM | POA: Diagnosis not present

## 2020-08-17 DIAGNOSIS — Z960 Presence of urogenital implants: Secondary | ICD-10-CM | POA: Diagnosis not present

## 2020-08-25 DIAGNOSIS — L570 Actinic keratosis: Secondary | ICD-10-CM | POA: Diagnosis not present

## 2020-09-06 ENCOUNTER — Other Ambulatory Visit: Payer: Self-pay | Admitting: Cardiology

## 2020-09-06 ENCOUNTER — Other Ambulatory Visit: Payer: Self-pay | Admitting: Physician Assistant

## 2020-09-06 DIAGNOSIS — I4819 Other persistent atrial fibrillation: Secondary | ICD-10-CM

## 2020-09-22 DIAGNOSIS — L578 Other skin changes due to chronic exposure to nonionizing radiation: Secondary | ICD-10-CM | POA: Diagnosis not present

## 2020-09-22 DIAGNOSIS — L821 Other seborrheic keratosis: Secondary | ICD-10-CM | POA: Diagnosis not present

## 2020-10-27 ENCOUNTER — Other Ambulatory Visit: Payer: Self-pay | Admitting: Medical

## 2020-11-02 ENCOUNTER — Other Ambulatory Visit: Payer: Self-pay | Admitting: Medical

## 2020-11-02 ENCOUNTER — Other Ambulatory Visit: Payer: Self-pay | Admitting: Cardiology

## 2020-11-02 DIAGNOSIS — I4819 Other persistent atrial fibrillation: Secondary | ICD-10-CM

## 2020-11-02 NOTE — Telephone Encounter (Signed)
Pt last saw Dr Radford Pax 09/23/19, pt is overdue for follow-up. Sent message to schedulers requesting them to call pt to schedule follow-up appt for pt.  Last labs 09/07/19 Creat 0.95, pt is overdue for labwork as well.  Age 70, weight 129.3kg, based on specified criteria pt is on appropriate dosage of Eliquis 5mg  BID.  Will await appt to be scheduled to refill rx to get pt to appt. Will place note on appt once scheduled pt will need CBC and BMP at [facility] f/u Eliquis labwork.

## 2020-11-04 NOTE — Telephone Encounter (Signed)
Attempted to call pt to notify overdue for follow-up with Dr Radford Pax.  Pt was due for 6 month f/u in 02/2020, and it has now been over 12 months since pt has last seen a provider.  LMOM for pt TCB to schedule f/u appt with Dr Radford Pax, so his refill for Eliquis can be sent to his pharmacy. Will await call back and appt to be made to refill rx.

## 2020-11-07 NOTE — Telephone Encounter (Signed)
Eliquis 5mg  refill request received. Patient is 70 years old, weight-129.3kg, Crea-0.95 on 09/07/2019-NEEDS UPDATED LABS-NOTE PLACED TO OBTAIN CBC & BMET FOR ELIQUIS F/U & ORDERED LABS, Diagnosis-Afib, and last seen by Roderic Palau on 09/07/2019 and now has a pending appt with Dr. Radford Pax on 12/13/2020. Dose is appropriate based on dosing criteria. Will send in limited refill to requested pharmacy as pt is pending labs and appt.

## 2020-11-17 ENCOUNTER — Other Ambulatory Visit: Payer: Self-pay | Admitting: Medical

## 2020-11-17 DIAGNOSIS — E118 Type 2 diabetes mellitus with unspecified complications: Secondary | ICD-10-CM

## 2020-11-24 ENCOUNTER — Other Ambulatory Visit: Payer: Self-pay | Admitting: Medical

## 2020-11-30 ENCOUNTER — Emergency Department (HOSPITAL_COMMUNITY): Payer: Medicare Other

## 2020-11-30 ENCOUNTER — Emergency Department (HOSPITAL_COMMUNITY)
Admission: EM | Admit: 2020-11-30 | Discharge: 2020-11-30 | Disposition: A | Discharge status: Home / Self Care | Payer: Medicare Other | Attending: Emergency Medicine | Admitting: Emergency Medicine

## 2020-11-30 ENCOUNTER — Encounter (HOSPITAL_COMMUNITY): Payer: Self-pay

## 2020-11-30 ENCOUNTER — Other Ambulatory Visit: Payer: Self-pay

## 2020-11-30 ENCOUNTER — Telehealth: Payer: Self-pay | Admitting: Physician Assistant

## 2020-11-30 DIAGNOSIS — Z87891 Personal history of nicotine dependence: Secondary | ICD-10-CM | POA: Diagnosis not present

## 2020-11-30 DIAGNOSIS — R55 Syncope and collapse: Secondary | ICD-10-CM | POA: Diagnosis not present

## 2020-11-30 DIAGNOSIS — Z79899 Other long term (current) drug therapy: Secondary | ICD-10-CM | POA: Diagnosis not present

## 2020-11-30 DIAGNOSIS — Z7901 Long term (current) use of anticoagulants: Secondary | ICD-10-CM | POA: Insufficient documentation

## 2020-11-30 DIAGNOSIS — I110 Hypertensive heart disease with heart failure: Secondary | ICD-10-CM | POA: Diagnosis not present

## 2020-11-30 DIAGNOSIS — E119 Type 2 diabetes mellitus without complications: Secondary | ICD-10-CM | POA: Insufficient documentation

## 2020-11-30 DIAGNOSIS — I2510 Atherosclerotic heart disease of native coronary artery without angina pectoris: Secondary | ICD-10-CM | POA: Insufficient documentation

## 2020-11-30 DIAGNOSIS — Z794 Long term (current) use of insulin: Secondary | ICD-10-CM | POA: Diagnosis not present

## 2020-11-30 DIAGNOSIS — I5032 Chronic diastolic (congestive) heart failure: Secondary | ICD-10-CM | POA: Insufficient documentation

## 2020-11-30 DIAGNOSIS — I4891 Unspecified atrial fibrillation: Secondary | ICD-10-CM | POA: Diagnosis not present

## 2020-11-30 DIAGNOSIS — R002 Palpitations: Secondary | ICD-10-CM | POA: Diagnosis present

## 2020-11-30 DIAGNOSIS — R0789 Other chest pain: Secondary | ICD-10-CM | POA: Diagnosis not present

## 2020-11-30 DIAGNOSIS — R42 Dizziness and giddiness: Secondary | ICD-10-CM | POA: Diagnosis not present

## 2020-11-30 LAB — COMPREHENSIVE METABOLIC PANEL
ALT: 21 U/L (ref 0–44)
AST: 25 U/L (ref 15–41)
Albumin: 3.7 g/dL (ref 3.5–5.0)
Alkaline Phosphatase: 76 U/L (ref 38–126)
Anion gap: 8 (ref 5–15)
BUN: 18 mg/dL (ref 8–23)
CO2: 21 mmol/L — ABNORMAL LOW (ref 22–32)
Calcium: 9.1 mg/dL (ref 8.9–10.3)
Chloride: 107 mmol/L (ref 98–111)
Creatinine, Ser: 1.13 mg/dL (ref 0.61–1.24)
GFR, Estimated: >60 mL/min (ref >60)
Glucose, Bld: 151 mg/dL — ABNORMAL HIGH (ref 70–99)
Potassium: 4 mmol/L (ref 3.5–5.1)
Sodium: 136 mmol/L (ref 135–145)
Total Bilirubin: 0.6 mg/dL (ref 0.3–1.2)
Total Protein: 6.6 g/dL (ref 6.5–8.1)

## 2020-11-30 LAB — CBC WITH DIFFERENTIAL/PLATELET
Abs Immature Granulocytes: 0.02 10*3/uL (ref 0.00–0.07)
Basophils Absolute: 0.1 10*3/uL (ref 0.0–0.1)
Basophils Relative: 1 %
Eosinophils Absolute: 0.2 10*3/uL (ref 0.0–0.5)
Eosinophils Relative: 3 %
HCT: 47.3 % (ref 39.0–52.0)
Hemoglobin: 15 g/dL (ref 13.0–17.0)
Immature Granulocytes: 0 %
Lymphocytes Relative: 43 %
Lymphs Abs: 3.1 10*3/uL (ref 0.7–4.0)
MCH: 27.8 pg (ref 26.0–34.0)
MCHC: 31.7 g/dL (ref 30.0–36.0)
MCV: 87.6 fL (ref 80.0–100.0)
Monocytes Absolute: 0.9 10*3/uL (ref 0.1–1.0)
Monocytes Relative: 12 %
Neutro Abs: 3 10*3/uL (ref 1.7–7.7)
Neutrophils Relative %: 41 %
Platelets: 244 10*3/uL (ref 150–400)
RBC: 5.4 MIL/uL (ref 4.22–5.81)
RDW: 15.9 % — ABNORMAL HIGH (ref 11.5–15.5)
WBC: 7.3 10*3/uL (ref 4.0–10.5)
nRBC: 0 % (ref 0.0–0.2)

## 2020-11-30 LAB — MAGNESIUM: Magnesium: 2.1 mg/dL (ref 1.7–2.4)

## 2020-11-30 LAB — TROPONIN I (HIGH SENSITIVITY): Troponin I (High Sensitivity): 7 ng/L (ref <18)

## 2020-11-30 MED ORDER — SODIUM CHLORIDE 0.9 % IV BOLUS
500.0000 mL | Freq: Once | INTRAVENOUS | Status: AC
  Administered 2020-11-30: 500 mL via INTRAVENOUS

## 2020-11-30 MED ORDER — ETOMIDATE 2 MG/ML IV SOLN
INTRAVENOUS | Status: AC | PRN
  Administered 2020-11-30: 10 mg via INTRAVENOUS

## 2020-11-30 MED ORDER — ETOMIDATE 2 MG/ML IV SOLN
10.0000 mg | Freq: Once | INTRAVENOUS | Status: DC
  Filled 2020-11-30: qty 10

## 2020-11-30 NOTE — ED Notes (Signed)
Consent at bedside.  

## 2020-11-30 NOTE — ED Provider Notes (Signed)
Emergency Medicine Provider Triage Evaluation Note  [name] 70 y.o. male was evaluated in triage.  Pt complains of palpitations.  He states he first noticed this this morning while he was out doing yard work.  States that he knows when he was in A. fib.  He states he had ablation a couple years ago and since then has not had any episodes of A. fib.  He states earlier he had some chest pain but denies any chest pain at this time.  Denies any shortness of breath.  He is on a blood thinner and states he has been taking it.  Denies any recent fevers, cough, chills   Review of Systems  Positive: Palpitations Negative: Fevers, cough, chills  Physical Exam  BP 134/82   Pulse 70   Temp 98.2 F (36.8 C) (Oral)   Resp 18   Ht 5\' 4"  (1.626 m)   Wt 65.8 kg   SpO2 100%   BMI 24.89 kg/m  Gen:   Awake, no distress   HEENT:  Atraumatic  Resp:  Normal effort Cardiac:  Tachycardic Abd:   Nondistended, nontender  MSK:   Moves extremities without difficulty  Neuro:  Speech clear   Other:     Medical Decision Making  Medically screening exam initiated at 7:34 PM  Appropriate orders placed.  [name] was informed that the remainder of the evaluation will be completed by another provider, this initial triage assessment does not replace that evaluation, and the importance of remaining in the ED until their evaluation is complete.  Given concerns for A. fib with RVR, patient will be moved back to the main ED  Clinical Impression  A. fib   Portions of this note were generated with Dragon dictation software. Dictation errors may occur despite best attempts at proofreading.     Volanda Napoleon, PA-C 11/30/20 1934    Noemi Chapel, MD 11/30/20 [phone]

## 2020-11-30 NOTE — ED Notes (Signed)
Pt ambulated in hallway well. No complaints noted.

## 2020-11-30 NOTE — Sedation Documentation (Signed)
Shock given at [place]

## 2020-11-30 NOTE — Telephone Encounter (Signed)
Patient called after hour answering service due to recurrent atrial fibrillation since last night.  Heart rate has been in the 140s despite extra dose of metoprolol this afternoon about 2 hours ago.  Systolic blood pressure reached as low as 88 mmHg this afternoon, current blood pressure is 149/86.  Again, even despite extra dose of metoprolol, the current heart rate is 138.  I do not think I can manage his atrial fibrillation with RVR as outpatient.  I recommend he come into the [facility], ED.  He has not missed any doses of Eliquis recently.  If he remains symptomatic, may consider ED cardioversion.  There has been several incidences today where he felt extremely dizzy feel like he is going to pass out.

## 2020-11-30 NOTE — ED Triage Notes (Signed)
Pt states that he was working on his yard today and felt himself go into afib. HR 140-150 some CP and SOB, hx of the same, took his home meds for it but it didn't work

## 2020-11-30 NOTE — ED Provider Notes (Signed)
[name] Provider Note   CSN: [id number] Arrival date & time: 11/30/20  1927     History Chief Complaint  Patient presents with  . Atrial Fibrillation  . AFIB rvr    [name] is a 70 y.o. male presenting for evaluation of chest pain and palpitations.  Patient states he noticed palpitations starting last night.  He was working in the yard today when the palpitations got worse and he got presyncopal.  He had chest pain at the time, has since resolved.  His heart rate was noted to be elevated and his blood pressure was low.  He called his cardiologist after taking a dose of metoprolol without improvement, it is recommended he come to the ER.  He denies recent fevers, chills, nausea vomiting, abd pain, urinary symptoms, abnormal bowel movements.  No recent change in medications.  He is on a blood thinner, has been taking it as prescribed. Pt states he has had multiple cardioversions, last one several years ago. Last oral intake 2 hrs PTA.  Additional history obtained per chart review.  Patient with a history of a flutter/A. fib, Bell's palsy, CAD, CVA, CHF, diabetes, hypertension, hyperlipidemia, obesity, OSA  HPI     Past Medical History:  Diagnosis Date  . Atrial flutter ([place])    a. atypical atrial flutter.  . Bell's palsy   . CAD (coronary artery disease)    a. 07/2009 Cath: LM nl, LAD nl, LCX nl w/ L->R collats, chronically occluded RCA s/p failed PCI;  b. 07/2012 MV: basal inf mild ischemia.  . Central retinal artery occlusion, left eye    diagnosed in 2008  . Cerebrovascular accident [facility])    a. 03/2008 secondary to cardioembolic event;  b. chronic coumadin.  . Chronic diastolic CHF (congestive heart failure) ([place])    a. 12/2008 Echo: nl EF.  . Diabetes mellitus 11/14/2010   TYPE II  . Hematuria 05/2013   Urology, Dr. Estill Dooms  . Hyperlipidemia   . Hypertension   . Hypogonadism, male 05/2013   Dr. Estill Dooms, Urology  . Morbid  obesity ([place])   . Obesity   . Obstructive sleep apnea   . Persistent atrial fibrillation ([place])    a. s/p afib ablation by [facility] 08/01/08  . Recommendation refused by patient    multiple recommended vaccines refused over the years (flu/pneumovax)  . Skin infection    chronic left leg herpetic    Patient Active Problem List   Diagnosis Date Noted  . Skin lesion 10/27/2019  . Tubular adenoma of colon 08/21/2016  . Vaccine refused by patient 06/06/2016  . Noncompliance 06/06/2016  . Vitamin D deficiency 06/06/2016  . Chronic diastolic heart failure ([place]) 01/27/2016  . Typical atrial flutter ([place])   . Hearing loss 06/22/2014  . OSA (obstructive sleep apnea) 06/22/2014  . Mixed dyslipidemia 06/22/2014  . Medicare annual wellness visit, subsequent 09/09/2013  . Unspecified cerebral artery occlusion with cerebral infarction 05/08/2013  . Diabetes mellitus with complication ([place]) 77/82/4235  . Essential hypertension, benign 07/02/2011  . Hypogonadism male 07/02/2011  . Morbid obesity ([place]) 02/01/2010  . CAD (coronary artery disease) 07/23/2009  . 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 Persistent atrial fibrillation ([place]) 09/06/2008    Past Surgical History:  Procedure Laterality Date  . ATRIAL ABLATION SURGERY  08/01/08   CTI and PVI ablation by [facility]  . COLONOSCOPY     2012 per patient  . ELECTROPHYSIOLOGIC STUDY N/A 11/30/2014   Procedure: Atrial Fibrillation  Ablation;  Surgeon: Hillis Range, MD;  Location: [facility] INVASIVE CV LAB;  Service: Cardiovascular;  Laterality: N/A;  . TEE WITHOUT CARDIOVERSION N/A 11/30/2014   Procedure: TRANSESOPHAGEAL ECHOCARDIOGRAM (TEE);  Surgeon: Wendall Stade, MD;  Location: [facility] ENDOSCOPY;  Service: Cardiovascular;  Laterality: N/A;       Family History  Problem Relation Age of Onset  . Stroke Father   . Heart disease Father   . Heart disease Mother     Social History   Tobacco Use  . Smoking status: Former Smoker    Quit date: 07/24/1979     Years since quitting: 41.3  . Smokeless tobacco: Never Used  Vaping Use  . Vaping Use: Never used  Substance Use Topics  . Alcohol use: No    Comment: former  . Drug use: No    Comment: former    Home Medications Prior to Admission medications   Medication Sig Start Date End Date Taking? Authorizing Provider  ACCU-CHEK FASTCLIX LANCETS MISC Test blood sugar bid. Dx code E11.9 03/26/17  Yes Tysinger, Kermit Balo, PA-C  acetaminophen (TYLENOL) 500 MG tablet Take 500 mg by mouth every 6 (six) hours as needed for mild pain or headache.   Yes [provider]  albuterol (VENTOLIN HFA) 108 (90 Base) MCG/ACT inhaler Inhale 2 puffs into the lungs every 6 (six) hours as needed for wheezing or shortness of breath. 07/30/20  Yes Ronnald Nian, MD  Alirocumab (PRALUENT) 75 MG/ML SOAJ INJECT THE CONTENTS OF 1 PEN INTO THE SKIN EVERY 14 DAYS Patient taking differently: Inject 75 mg into the skin every 14 (fourteen) days. 03/29/20  Yes Turner, Cornelious Bryant, MD  amLODipine (NORVASC) 5 MG tablet Take 1 tablet by mouth once daily Patient taking differently: Take 5 mg by mouth daily. 07/21/20  Yes Turner, Cornelious Bryant, MD  apixaban (ELIQUIS) 5 MG TABS tablet Take 1 tablet (5 mg total) by mouth 2 (two) times daily. Please adhere to upcoming appointment. 11/07/20  Yes Turner, Cornelious Bryant, MD  atorvastatin (LIPITOR) 80 MG tablet Take 1 tablet (80 mg total) by mouth daily. Patient taking differently: Take 80 mg by mouth at bedtime. 10/28/19  Yes Tysinger, Kermit Balo, PA-C  b complex vitamins capsule Take 1 capsule by mouth every other day.    Yes [provider]  BD PEN NEEDLE NANO 2ND GEN 32G X 4 MM MISC USE 1 PEN NEEDLE AT BEDTIME 01/28/20  Yes Tysinger, Kermit Balo, PA-C  Cholecalciferol (VITAMIN D3) 10000 UNITS capsule Take 1 capsule (10,000 Units total) by mouth daily. 01/19/15  Yes Tysinger, Kermit Balo, PA-C  clobetasol (TEMOVATE) 0.05 % external solution Apply 1 application topically daily as needed (rash). 09/23/20  Yes  [provider]  glucose blood (ACCU-CHEK AVIVA PLUS) test strip USE 1 STRIP TO CHECK GLUCOSE 1-2 TIMES DAILY 11/17/20  Yes Tysinger, Kermit Balo, PA-C  insulin degludec (TRESIBA FLEXTOUCH) 100 UNIT/ML FlexTouch Pen Inject 0.15 mLs (15 Units total) into the skin at bedtime. 10/30/19  Yes Tysinger, Kermit Balo, PA-C  isosorbide mononitrate (IMDUR) 30 MG 24 hr tablet Take 1 tablet by mouth once daily Patient taking differently: Take 30 mg by mouth daily. 06/06/20  Yes Turner, Cornelious Bryant, MD  ketoconazole (NIZORAL) 2 % shampoo Apply 1 application topically See admin instructions. 2-3 times per week 09/23/20  Yes [provider]  losartan (COZAAR) 100 MG tablet Take 1 tablet (100 mg total) by mouth daily. Call and schedule follow up office visit. 09/06/20  Yes Armanda Magic  R, MD  metoprolol tartrate (LOPRESSOR) 50 MG tablet Take 1 tablet by mouth twice daily (May take 1 extra tablet if heart rate is 110 or higher). Please make overdue appt with Dr. Radford Pax before anymore refills. Thank you 2nd attempt 11/02/20  Yes Turner, Eber Hong, MD  omega-3 acid ethyl esters (LOVAZA) 1 g capsule Take 2 capsules by mouth twice daily 10/19/19  Yes Denita Lung, MD  OZEMPIC, 1 MG/DOSE, 4 MG/3ML SOPN INJECT 1MG  SUBCUTANEOUSLY ONCE A WEEK Patient taking differently: Inject 1 mg into the skin every Wednesday. 11/24/20  Yes Tysinger, Camelia Eng, PA-C  Testosterone (ANDROGEL PUMP) 12.5 MG/ACT (1%) GEL Place 2 application onto the skin daily.   Yes [provider]  valACYclovir (VALTREX) 500 MG tablet Take 1 tablet by mouth once daily 11/02/20  Yes Tysinger, Camelia Eng, PA-C  Semaglutide, 1 MG/DOSE, (OZEMPIC, 1 MG/DOSE,) 2 MG/1.5ML SOPN Inject 1 mg into the skin once a week. 10/28/19   Tysinger, Camelia Eng, PA-C    Allergies    Crestor [rosuvastatin], Zetia [ezetimibe], Lisinopril, Farxiga [dapagliflozin], and Metformin and related  Review of Systems   Review of Systems  Respiratory: Positive for shortness of breath.    Cardiovascular: Positive for chest pain and palpitations.  All other systems reviewed and are negative.   Physical Exam Updated Vital Signs BP 118/73   Pulse 77   Temp 97.7 F (36.5 C) (Oral)   Resp 19   SpO2 98%   Physical Exam Vitals and nursing note reviewed.  Constitutional:      General: He is not in acute distress.    Appearance: He is well-developed. He is obese.     Comments: nontoxic  HENT:     Head: Normocephalic and atraumatic.  Eyes:     Extraocular Movements: Extraocular movements intact.     Conjunctiva/sclera: Conjunctivae normal.     Pupils: Pupils are equal, round, and reactive to light.  Cardiovascular:     Rate and Rhythm: Tachycardia present. Rhythm irregular.     Pulses: Normal pulses.     Comments: tachycardic and irregularly irregular Pulmonary:     Effort: Pulmonary effort is normal. No respiratory distress.     Breath sounds: Normal breath sounds. No wheezing.  Abdominal:     General: There is no distension.     Palpations: Abdomen is soft. There is no mass.     Tenderness: There is no abdominal tenderness. There is no guarding or rebound.  Musculoskeletal:        General: Normal range of motion.     Cervical back: Normal range of motion and neck supple.     Right lower leg: No edema.     Left lower leg: No edema.  Skin:    General: Skin is warm and dry.     Capillary Refill: Capillary refill takes less than 2 seconds.  Neurological:     Mental Status: He is alert and oriented to person, place, and time.     ED Results / Procedures / Treatments   Labs (all labs ordered are listed, but only abnormal results are displayed) Labs Reviewed  COMPREHENSIVE METABOLIC PANEL - Abnormal; Notable for the following components:      Result Value   CO2 21 (*)    Glucose, Bld 151 (*)    All other components within normal limits  CBC WITH DIFFERENTIAL/PLATELET - Abnormal; Notable for the following components:   RDW 15.9 (*)    All other components  within normal  limits  MAGNESIUM  TROPONIN I (HIGH SENSITIVITY)    EKG EKG Interpretation  Date/Time:  Wednesday Nov 30 2020 19:35:30 EDT Ventricular Rate:  132 PR Interval:    QRS Duration: 90 QT Interval:  310 QTC Calculation: 459 R Axis:   -12 Text Interpretation: Undetermined rhythm Septal infarct , age undetermined Abnormal ECG When compared to prior, now appears to show afib with RVR. No STEMI Confirmed by Antony Blackbird [id number]) on 11/30/2020 7:54:09 PM   Radiology DG Chest 2 View  Result Date: 11/30/2020 CLINICAL DATA:  Dizziness chest tightness EXAM: CHEST - 2 VIEW COMPARISON:  01/26/2018 FINDINGS: The heart size and mediastinal contours are within normal limits. Linear atelectasis or scarring at the left base. Degenerative changes of the spine. IMPRESSION: No active cardiopulmonary disease. Electronically Signed   By: Donavan Foil M.D.   On: 11/30/2020 21:06    Procedures .Critical Care Performed by: Franchot Heidelberg, PA-C Authorized by: Franchot Heidelberg, PA-C   Critical care provider statement:    Critical care time (minutes):  50   Critical care time was exclusive of:  Separately billable procedures and treating other patients and teaching time   Critical care was necessary to treat or prevent imminent or life-threatening deterioration of the following conditions:  Cardiac failure   Critical care was time spent personally by me on the following activities:  Blood draw for specimens, development of treatment plan with patient or surrogate, evaluation of patient's response to treatment, examination of patient, obtaining history from patient or surrogate, ordering and performing treatments and interventions, ordering and review of laboratory studies, ordering and review of radiographic studies, pulse oximetry, re-evaluation of patient's condition and review of old charts   I assumed direction of critical care for this patient from another provider in my specialty: no      Care discussed with: admitting provider   Comments:     Pt in a fib with rvr requiring intervention.   .Cardioversion  Date/Time: 11/30/2020 10:07 PM Performed by: Franchot Heidelberg, PA-C Authorized by: Franchot Heidelberg, PA-C   Consent:    Consent obtained:  Verbal   Consent given by:  Patient   Risks discussed:  Cutaneous burn, death, induced arrhythmia and pain Pre-procedure details:    Cardioversion basis:  Emergent   Rhythm:  Atrial fibrillation   Electrode placement:  Anterior-posterior Attempt one:    Cardioversion mode:  Synchronous   Shock (Joules):  200   Shock outcome:  Conversion to normal sinus rhythm Post-procedure details:    Patient status:  Awake   Patient tolerance of procedure:  Tolerated well, no immediate complications     Medications Ordered in ED Medications  etomidate (AMIDATE) injection 10 mg (has no administration in time range)  sodium chloride 0.9 % bolus 500 mL (0 mLs Intravenous Stopped 11/30/20 2112)  etomidate (AMIDATE) injection (10 mg Intravenous Given 11/30/20 2158)    ED Course  I have reviewed the triage vital signs and the nursing notes.  Pertinent labs & imaging results that were available during my care of the patient were reviewed by me and considered in my medical decision making (see chart for details).    MDM Rules/Calculators/A&P                          Patient presenting for evaluation of chest pain, palpitations, and shortness of breath.  On exam, patient appears nontoxic.  He is in A. fib with RVR and is mildly tachycardic.  He was symptomatic with presyncope earlier today.  He is anticoagulated.  He would be a candidate for cardioversion if needed.  Will obtain labs to ensure no underlying metabolic abnormality.  Will obtain chest x-ray and EKG.  Labs interpreted by me, overall reassuring.  No significant electrolyte abnormalities.  Chest x-ray viewed and independently interpreted by me, no pneumonia, pneumothorax, effusion.    Discussed findings with patient.  Discussed option for cardioversion, patient is agreeable to this.  I discussed risks and benefits with patient and wife, they state they understand and agree to plan.  Cardioversion performed as described above.  Please see Dr. Doreene Burke note for procedural sedation documentation.   On reevaluation after procedure, patient remains in normal sinus rhythm.  He states he is feeling well.  He has no chest pain, palpitations, shortness of breath.  Patient ambulated in the hall without difficulty.  He did not return to A. Fib.  He feels well to go home.  I encouraged him to follow-up with cardiology.  At this time, patient appears safe for discharge.  Return precautions given.  Patient states he understands and agrees to plan.   Final Clinical Impression(s) / ED Diagnoses Final diagnoses:  Atrial fibrillation with RVR [facility])    Rx / DC Orders ED Discharge Orders    None       Franchot Heidelberg, PA-C 11/30/20 2326    Tegeler, Gwenyth Allegra, MD 12/01/20 [phone]

## 2020-11-30 NOTE — Discharge Instructions (Signed)
Continue taking home medications as prescribed. Make sure you stay well-hydrated with water. Call your cardiologist to set up a follow-up appointment. Return to the emergency room if you develop any new, worsening, or concerning symptoms

## 2020-12-01 NOTE — ED Provider Notes (Signed)
.  Sedation  Date/Time: 12/01/2020 1:59 AM Performed by: Courtney Paris, MD Authorized by: Courtney Paris, MD   Consent:    Consent obtained:  Written   Consent given by:  Patient   Risks discussed:  Allergic reaction, prolonged sedation necessitating reversal, prolonged hypoxia resulting in organ damage, respiratory compromise necessitating ventilatory assistance and intubation, vomiting, nausea, dysrhythmia and inadequate sedation   Alternatives discussed:  Analgesia without sedation Universal protocol:    Procedure explained and questions answered to patient or proxy's satisfaction: yes     Immediately prior to procedure, a time out was called: yes   Indications:    Procedure performed:  Cardioversion   Procedure necessitating sedation performed by:  Physician performing sedation Pre-sedation assessment:    Time since last food or drink:  4 hours   ASA classification: class 2 - patient with mild systemic disease     Mouth opening:  3 or more finger widths   Mallampati score:  I - soft palate, uvula, fauces, pillars visible   Neck mobility: normal     Pre-sedation assessments completed and reviewed: airway patency, cardiovascular function, hydration status, mental status, nausea/vomiting, pain level and respiratory function   Immediate pre-procedure details:    Reassessment: Patient reassessed immediately prior to procedure   Procedure details (see MAR for exact dosages):    Preoxygenation:  Nasal cannula   Sedation:  Etomidate   Intended level of sedation: deep   Intra-procedure monitoring:  Blood pressure monitoring, continuous capnometry, continuous pulse oximetry, cardiac monitor, frequent LOC assessments and frequent vital sign checks   Intra-procedure events: none     Total Provider sedation time (minutes):  30 Post-procedure details:    Attendance: Constant attendance by certified staff until patient recovered     Recovery: Patient returned to pre-procedure  baseline     Patient is stable for discharge or admission: yes     Procedure completion:  Tolerated well, no immediate complications      [place] Mcmahill, Gwenyth Allegra, MD 12/01/20 0200

## 2020-12-03 ENCOUNTER — Other Ambulatory Visit: Payer: Self-pay | Admitting: Medical

## 2020-12-06 ENCOUNTER — Ambulatory Visit (HOSPITAL_COMMUNITY)
Admission: RE | Admit: 2020-12-06 | Discharge: 2020-12-06 | Disposition: A | Discharge status: Home / Self Care | Payer: Medicare Other | Source: Ambulatory Visit | Attending: Nurse Practitioner | Admitting: Nurse Practitioner

## 2020-12-06 ENCOUNTER — Encounter (HOSPITAL_COMMUNITY): Payer: Self-pay | Admitting: Nurse Practitioner

## 2020-12-06 ENCOUNTER — Other Ambulatory Visit: Payer: Self-pay

## 2020-12-06 VITALS — BP 118/68 | HR 74 | Ht 70.0 in | Wt 269.4 lb

## 2020-12-06 DIAGNOSIS — I4891 Unspecified atrial fibrillation: Secondary | ICD-10-CM | POA: Insufficient documentation

## 2020-12-06 DIAGNOSIS — I4819 Other persistent atrial fibrillation: Secondary | ICD-10-CM

## 2020-12-06 DIAGNOSIS — E119 Type 2 diabetes mellitus without complications: Secondary | ICD-10-CM | POA: Diagnosis not present

## 2020-12-06 DIAGNOSIS — Z7901 Long term (current) use of anticoagulants: Secondary | ICD-10-CM | POA: Insufficient documentation

## 2020-12-06 DIAGNOSIS — I5022 Chronic systolic (congestive) heart failure: Secondary | ICD-10-CM | POA: Diagnosis not present

## 2020-12-06 DIAGNOSIS — I4892 Unspecified atrial flutter: Secondary | ICD-10-CM | POA: Diagnosis not present

## 2020-12-06 DIAGNOSIS — Z6836 Body mass index (BMI) 36.0-36.9, adult: Secondary | ICD-10-CM | POA: Insufficient documentation

## 2020-12-06 DIAGNOSIS — Z794 Long term (current) use of insulin: Secondary | ICD-10-CM | POA: Insufficient documentation

## 2020-12-06 DIAGNOSIS — Z87891 Personal history of nicotine dependence: Secondary | ICD-10-CM | POA: Insufficient documentation

## 2020-12-06 DIAGNOSIS — D6869 Other thrombophilia: Secondary | ICD-10-CM

## 2020-12-06 DIAGNOSIS — I480 Paroxysmal atrial fibrillation: Secondary | ICD-10-CM | POA: Diagnosis not present

## 2020-12-06 DIAGNOSIS — G4733 Obstructive sleep apnea (adult) (pediatric): Secondary | ICD-10-CM | POA: Insufficient documentation

## 2020-12-06 DIAGNOSIS — I110 Hypertensive heart disease with heart failure: Secondary | ICD-10-CM | POA: Diagnosis not present

## 2020-12-06 DIAGNOSIS — I2510 Atherosclerotic heart disease of native coronary artery without angina pectoris: Secondary | ICD-10-CM | POA: Insufficient documentation

## 2020-12-06 DIAGNOSIS — Z79899 Other long term (current) drug therapy: Secondary | ICD-10-CM | POA: Insufficient documentation

## 2020-12-06 MED ORDER — METOPROLOL TARTRATE 50 MG PO TABS: ORAL_TABLET | ORAL | 6 refills | Status: DC

## 2020-12-06 NOTE — Progress Notes (Signed)
Primary Care Physician: Carlena Hurl, PA-C Referring Physician: Dr. Loistine Chance Feeny is a 70 y.o. male with a h/o CAD, CVA, DM II,atrial  flutter/fib with ablations in 2010/2016. He is here today as a matter of a 2 month f/u from  Dr. Rayann Heman but states that he went out of rhythm since last Friday and not having power over the weekend, and not being able to use his CPAP, has contributed to him being out of rhythm since then. He usually will be out of rhythm  just a short period of time and will return on his on. EKG shows atrial flutter at 150 bpm and he is tolerating well so far. He has to go get a generator today as he still does not have power.He  continues on eliquis with a CHA2DS2VASc score of 7.  F/u in afib clinic, 12/07/19. I have not seen since 08/2020. He had return to afib with RVR and had a successful cardioversion in the ER 11/30/20. EKG now shows SR. He feels the start of a new job may have triggered it. 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 He does not drink alcohol or use  tobacco. He is religious to use CPAP. Moderate coffee consumption.   Today, he denies symptoms of palpitations, chest pain, shortness of breath, orthopnea, PND, lower extremity edema, dizziness, presyncope, syncope, or neurologic sequela. The patient is tolerating medications without difficulties and is otherwise without complaint today.   Past Medical History:  Diagnosis Date  . Atrial flutter ([place])    a. atypical atrial flutter.  . Bell's palsy   . CAD (coronary artery disease)    a. 07/2009 Cath: LM nl, LAD nl, LCX nl w/ L->R collats, chronically occluded RCA s/p failed PCI;  b. 07/2012 MV: basal inf mild ischemia.  . Central retinal artery occlusion, left eye    diagnosed in 2008  . Cerebrovascular accident [facility])    a. 03/2008 secondary to cardioembolic event;  b. chronic coumadin.  . Chronic diastolic CHF (congestive heart failure) ([place])    a.  12/2008 Echo: nl EF.  . Diabetes mellitus 11/14/2010   TYPE II  . Hematuria 05/2013   Urology, Dr. Estill Dooms  . Hyperlipidemia   . Hypertension   . Hypogonadism, male 05/2013   Dr. Estill Dooms, Urology  . Morbid obesity ([place])   . Obesity   . Obstructive sleep apnea   . Persistent atrial fibrillation ([place])    a. s/p afib ablation by [facility] 08/01/08  . Recommendation refused by patient    multiple recommended vaccines refused over the years (flu/pneumovax)  . Skin infection    chronic left leg herpetic   Past Surgical History:  Procedure Laterality Date  . ATRIAL ABLATION SURGERY  08/01/08   CTI and PVI ablation by [facility]  . COLONOSCOPY     2012 per patient  . ELECTROPHYSIOLOGIC STUDY N/A 11/30/2014   Procedure: Atrial Fibrillation Ablation;  Surgeon: [name], MD;  Location: [place] CV LAB;  Service: Cardiovascular;  Laterality: N/A;  . TEE WITHOUT CARDIOVERSION N/A 11/30/2014   Procedure: TRANSESOPHAGEAL ECHOCARDIOGRAM (TEE);  Surgeon: Josue Hector, MD;  Location: [facility] ENDOSCOPY;  Service: Cardiovascular;  Laterality: N/A;    Current Outpatient Medications  Medication Sig Dispense Refill  . ACCU-CHEK FASTCLIX LANCETS MISC Test blood sugar bid. Dx code E11.9 102 each 1  . acetaminophen (TYLENOL) 500 MG tablet Take 500 mg by mouth every  6 (six) hours as needed for mild pain or headache.    . albuterol (VENTOLIN HFA) 108 (90 Base) MCG/ACT inhaler Inhale 2 puffs into the lungs every 6 (six) hours as needed for wheezing or shortness of breath. 8 g 0  . Alirocumab (PRALUENT) 75 MG/ML SOAJ INJECT THE CONTENTS OF 1 PEN INTO THE SKIN EVERY 14 DAYS (Patient taking differently: Inject 75 mg into the skin every 14 (fourteen) days.) 2 mL 11  . amLODipine (NORVASC) 5 MG tablet Take 1 tablet by mouth once daily (Patient taking differently: Take 5 mg by mouth daily.) 90 tablet 3  . apixaban (ELIQUIS) 5 MG TABS tablet Take 1 tablet (5 mg total) by mouth 2 (two) times daily. Please adhere to upcoming  appointment. 60 tablet 1  . atorvastatin (LIPITOR) 80 MG tablet Take 1 tablet by mouth once daily 90 tablet 0  . b complex vitamins capsule Take 1 capsule by mouth every other day.     . BD PEN NEEDLE NANO 2ND GEN 32G X 4 MM MISC USE 1 PEN NEEDLE AT BEDTIME 100 each 0  . Cholecalciferol (VITAMIN D3) 10000 UNITS capsule Take 1 capsule (10,000 Units total) by mouth daily. 4 capsule 0  . clobetasol (TEMOVATE) 0.05 % external solution Apply 1 application topically daily as needed (rash).    [name] glucose blood (ACCU-CHEK AVIVA PLUS) test strip USE 1 STRIP TO CHECK GLUCOSE 1-2 TIMES DAILY 100 each 0  . insulin degludec (TRESIBA FLEXTOUCH) 100 UNIT/ML FlexTouch Pen Inject 0.15 mLs (15 Units total) into the skin at bedtime. 15 mL 5  . isosorbide mononitrate (IMDUR) 30 MG 24 hr tablet Take 1 tablet by mouth once daily (Patient taking differently: Take 30 mg by mouth daily.) 90 tablet 1  . ketoconazole (NIZORAL) 2 % shampoo Apply 1 application topically See admin instructions. 2-3 times per week    . losartan (COZAAR) 100 MG tablet Take 1 tablet (100 mg total) by mouth daily. Call and schedule follow up office visit. 30 tablet 0  . metoprolol tartrate (LOPRESSOR) 50 MG tablet Take 1 tablet by mouth twice daily (May take 1 extra tablet if heart rate is 110 or higher). Please make overdue appt with Dr. Radford Pax before anymore refills. Thank you 2nd attempt 37 tablet 0  . omega-3 acid ethyl esters (LOVAZA) 1 g capsule Take 2 capsules by mouth twice daily 360 capsule 0  . OZEMPIC, 1 MG/DOSE, 4 MG/3ML SOPN INJECT 1MG SUBCUTANEOUSLY ONCE A WEEK (Patient taking differently: Inject 1 mg into the skin every Wednesday.) 3 mL 1  . Semaglutide, 1 MG/DOSE, (OZEMPIC, 1 MG/DOSE,) 2 MG/1.5ML SOPN Inject 1 mg into the skin once a week. 1.5 mL 5  . Testosterone (ANDROGEL PUMP) 12.5 MG/ACT (1%) GEL Place 2 application onto the skin daily.    . valACYclovir (VALTREX) 500 MG tablet Take 1 tablet by mouth once daily 90 tablet 0   No  current facility-administered medications for this encounter.    Allergies  Allergen Reactions  . Crestor [Rosuvastatin] Other (See Comments)    Made him feel crazy  . Zetia [Ezetimibe] Other (See Comments)    Made him feel crazy  . Lisinopril Cough  . Wilder Glade [Dapagliflozin]     Made heart race  . Metformin And Related     Mental fog    Social History   Socioeconomic History  . Marital status: Married    Spouse name: Not on file  . Number of children: Not on file  .  Years of education: Not on file  . Highest education level: Not on file  Occupational History  . Not on file  Tobacco Use  . Smoking status: Former Smoker    Quit date: 07/24/1979    Years since quitting: 41.4  . Smokeless tobacco: Never Used  Vaping Use  . Vaping Use: Never used  Substance and Sexual Activity  . Alcohol use: No    Comment: former  . Drug use: No    Comment: former  . Sexual activity: Not on file  Other Topics Concern  . Not on file  Social History Narrative   Lives in [place], [place]. With his spouse and son. Has remote history of tobacco, but quit 30 years ago. Has heavy history of alcohol consumption but quit 30 years ago. Previously used marijuana and cocaine, but denies use over the past 30 years. 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    Social Determinants of Health   Financial Resource Strain: Not on file  Food Insecurity: Not on file  Transportation Needs: Not on file  Physical Activity: Not on file  Stress: Not on file  Social Connections: Not on file  Intimate Partner Violence: Not on file    Family History  Problem Relation Age of Onset  . Stroke Father   . Heart disease Father   . Heart disease Mother     ROS- All systems are reviewed and negative except as per the HPI above  Physical Exam: There were no vitals filed for this visit. Wt Readings from Last 3 Encounters:  07/27/20 129.3 kg  10/27/19 127 kg  09/23/19 127.5 kg    Labs: Lab Results   Component Value Date   NA 136 11/30/2020   K 4.0 11/30/2020   CL 107 11/30/2020   CO2 21 (L) 11/30/2020   GLUCOSE 151 (H) 11/30/2020   BUN 18 11/30/2020   CREATININE 1.13 11/30/2020   CALCIUM 9.1 11/30/2020   MG 2.1 11/30/2020   Lab Results  Component Value Date   INR 2.8 10/13/2018   Lab Results  Component Value Date   CHOL 99 (L) 09/24/2019   HDL 40 09/24/2019   LDLCALC 33 09/24/2019   TRIG 158 (H) 09/24/2019     GEN- The patient is well appearing, alert and oriented x 3 today.   Head- normocephalic, atraumatic Eyes-  Sclera clear, conjunctiva pink Ears- hearing intact Oropharynx- clear Neck- supple, no JVP Lymph- no cervical lymphadenopathy Lungs- Clear to ausculation bilaterally, normal work of breathing Heart-   regular rate and rhythm, no murmurs, rubs or gallops, PMI not laterally displaced GI- soft, NT, ND, + BS Extremities- no clubbing, cyanosis, or edema MS- no significant deformity or atrophy Skin- no rash or lesion Psych- euthymic mood, full affect Neuro- strength and sensation are intact  EKG-NSR at 74 bpm, pr int 174 ms, qrs int 84 ms, qtc 424 ms  Echo-Study Conclusions  - Left ventricle: The cavity size was normal. There was moderate   focal basal hypertrophy of the septum. Systolic function was   normal. The estimated ejection fraction was in the range of 60%   to 65%. Wall motion was normal; there were no regional wall   motion abnormalities. Doppler parameters are consistent with   pseudonormal left ventricular relaxation (grade 2 diastolic   dysfunction). The E/A ratio is 1.8, the E/e&' ratio is >15,   suggesting elevated LV filling pressure. - Aortic valve: Trileaflet; mildly calcified leaflets. - Mitral valve: Calcified annulus. There was trivial  regurgitation. - Left atrium: The atrium was at the upper limits of normal in   size. - Tricuspid valve: There was moderate regurgitation. - Pulmonary arteries: PA peak pressure: 48 mm Hg  (S). - Inferior vena cava: The vessel was normal in size. The   respirophasic diameter changes were in the normal range (= 50%),   consistent with normal central venous pressure.  Impressions:  - LVEF 60-65%, moderate focal basal septal hypertrophy without LVOT   obstruction, normal wall motion, Stage 2 diastolic dysfunction,   upper normal LV size, MAC, trivial MR, moderate TR, RVSP 48 mmHg,   normal IVC size.   Assessment and Plan: 1. Afib/flutter  Successful cardioversion in the ER 5/11 Maintaining  SR  Feels like the stress of starting a new job triggered it  In [facility] today  Continue metoprolol tartrate 50 mg bid    2. CHA2DS2VASc score of 7 Continue  eliquis 5 mg bid   3. OSA Continue  to use cpap  Dr. Radford Pax as scheduled, afib clinic as needed   Geroge Baseman. Shakiara Lukic, [facility] [address] [place], [place] [address] [phone]

## 2020-12-13 ENCOUNTER — Encounter: Payer: Self-pay | Admitting: Cardiology

## 2020-12-13 ENCOUNTER — Ambulatory Visit (INDEPENDENT_AMBULATORY_CARE_PROVIDER_SITE_OTHER): Payer: Medicare Other | Admitting: Cardiology

## 2020-12-13 ENCOUNTER — Other Ambulatory Visit: Payer: Medicare Other | Admitting: *Deleted

## 2020-12-13 ENCOUNTER — Other Ambulatory Visit: Payer: Self-pay

## 2020-12-13 VITALS — BP 142/80 | HR 76 | Ht 70.5 in | Wt 267.0 lb

## 2020-12-13 DIAGNOSIS — G4733 Obstructive sleep apnea (adult) (pediatric): Secondary | ICD-10-CM | POA: Diagnosis not present

## 2020-12-13 DIAGNOSIS — E782 Mixed hyperlipidemia: Secondary | ICD-10-CM

## 2020-12-13 DIAGNOSIS — I4819 Other persistent atrial fibrillation: Secondary | ICD-10-CM

## 2020-12-13 DIAGNOSIS — I5032 Chronic diastolic (congestive) heart failure: Secondary | ICD-10-CM | POA: Diagnosis not present

## 2020-12-13 DIAGNOSIS — I2510 Atherosclerotic heart disease of native coronary artery without angina pectoris: Secondary | ICD-10-CM

## 2020-12-13 DIAGNOSIS — I10 Essential (primary) hypertension: Secondary | ICD-10-CM

## 2020-12-13 LAB — CBC
Hematocrit: 44.3 % (ref 37.5–51.0)
Hemoglobin: 14.8 g/dL (ref 13.0–17.7)
MCH: 27.4 pg (ref 26.6–33.0)
MCHC: 33.4 g/dL (ref 31.5–35.7)
MCV: 82 fL (ref 79–97)
Platelets: 244 10*3/uL (ref 150–450)
RBC: 5.4 x10E6/uL (ref 4.14–5.80)
RDW: 15.1 % (ref 11.6–15.4)
WBC: 6.9 10*3/uL (ref 3.4–10.8)

## 2020-12-13 LAB — BASIC METABOLIC PANEL
BUN/Creatinine Ratio: 15 (ref 10–24)
BUN: 15 mg/dL (ref 8–27)
CO2: 22 mmol/L (ref 20–29)
Calcium: 9.1 mg/dL (ref 8.6–10.2)
Chloride: 101 mmol/L (ref 96–106)
Creatinine, Ser: 0.99 mg/dL (ref 0.76–1.27)
Glucose: 130 mg/dL — ABNORMAL HIGH (ref 65–99)
Potassium: 4.6 mmol/L (ref 3.5–5.2)
Sodium: 139 mmol/L (ref 134–144)
eGFR: 82 mL/min/{1.73_m2} (ref >59)

## 2020-12-13 LAB — LIPID PANEL
Chol/HDL Ratio: 2.1 ratio (ref 0.0–5.0)
Cholesterol, Total: 92 mg/dL — ABNORMAL LOW (ref 100–199)
HDL: 44 mg/dL (ref >39)
LDL Chol Calc (NIH): 25 mg/dL (ref 0–99)
Triglycerides: 129 mg/dL (ref 0–149)
VLDL Cholesterol Cal: 23 mg/dL (ref 5–40)

## 2020-12-13 LAB — ALT: ALT: 20 IU/L (ref 0–44)

## 2020-12-13 MED ORDER — ISOSORBIDE MONONITRATE ER 30 MG PO TB24: 30.0000 mg | ORAL_TABLET | Freq: Every day | ORAL | 1 refills | Status: DC

## 2020-12-13 MED ORDER — OMEGA-3-ACID ETHYL ESTERS 1 G PO CAPS: 2.0000 | ORAL_CAPSULE | Freq: Two times a day (BID) | ORAL | 1 refills | Status: DC

## 2020-12-13 MED ORDER — AMLODIPINE BESYLATE 5 MG PO TABS: 5.0000 mg | ORAL_TABLET | Freq: Every day | ORAL | 1 refills | Status: DC

## 2020-12-13 MED ORDER — LOSARTAN POTASSIUM 100 MG PO TABS: 100.0000 mg | ORAL_TABLET | Freq: Every day | ORAL | 1 refills | Status: DC

## 2020-12-13 MED ORDER — PRALUENT 75 MG/ML ~~LOC~~ SOAJ
75.0000 mg | SUBCUTANEOUS | 1 refills | Status: DC
Start: 2020-12-13 — End: 2021-06-19

## 2020-12-13 MED ORDER — ELIQUIS 5 MG PO TABS: 1.0000 | ORAL_TABLET | Freq: Two times a day (BID) | ORAL | 1 refills | Status: DC

## 2020-12-13 MED ORDER — ATORVASTATIN CALCIUM 80 MG PO TABS: 1.0000 | ORAL_TABLET | Freq: Every day | ORAL | 1 refills | Status: DC

## 2020-12-13 MED ORDER — METOPROLOL TARTRATE 50 MG PO TABS: ORAL_TABLET | ORAL | 6 refills | Status: DC

## 2020-12-13 NOTE — Addendum Note (Signed)
Addended by: Antonieta Iba on: 12/13/2020 08:32 AM   Modules accepted: Orders

## 2020-12-13 NOTE — Progress Notes (Signed)
Date:  12/13/2020   ID:  [name], DOB 1951/03/09, MRN [id number]  PCP:  Carlena Hurl, PA-C  Cardiologist:  Fransico Him, MD  Electrophysiologist:  None   Chief Complaint:  CAD, HTN, PAF, OSA  History of Present Illness:    [name] is a 70 y.o. male with a hx of ASCADwith chronically occluded RCA s/p failed PCI with L>>R collaterals, HTN, OSAon CPAP, morbid obesity,persistentAFs/p afib ablation, atrial flutterunable to ablateat EP studyanddyslipidemia. He has had problems with recurrent afib and is now followed by afib clinic.  He was seen in afib clinic 09/07/2019 due to having recurrent afib after electric went out and he could not use his PAP device.  At that [place] he was in atrial flutter at 150bpm.  His metoprolol was increased and he was back in NSR by EKG 09/11/2019.   He was seen in ER on 11/30/2020 with dizziness and pre syncope and was found to be in afib with RVR and had successuful DCCV to NSR.  He had started a new job and though that that may have triggered it.  He uses his PAP nightly and never misses it. He was seen back in afib clinic and no med changes were made.   He is here today for followup and is doing well.  He denies any anginal chest pain or pressure, SOB, DOE, PND, orthopnea, LE edema, palpitations or syncope. He is compliant with his meds and is tolerating meds with no SE.    He is doing well with his CPAP device and thinks that he has gotten used to it.  He tolerates the mask and feels the pressure is adequate.  Since going on CPAP he feels rested in the am and has no significant daytime sleepiness.  He denies any significant mouth or nasal dryness or nasal congestion.  He does not think that he snores.     Prior CV studies:   The following studies were reviewed today:  PAP compliance download from [facility], outside labs from PCP  Past Medical History:  Diagnosis Date  . Atrial flutter ([place])    a. atypical atrial flutter.  . Bell's palsy    . CAD (coronary artery disease)    a. 07/2009 Cath: LM nl, LAD nl, LCX nl w/ L->R collats, chronically occluded RCA s/p failed PCI;  b. 07/2012 MV: basal inf mild ischemia.  . Central retinal artery occlusion, left eye    diagnosed in 2008  . Cerebrovascular accident [facility])    a. 03/2008 secondary to cardioembolic event;  b. chronic coumadin.  . Chronic diastolic CHF (congestive heart failure) ([place])    a. 12/2008 Echo: nl EF.  . Diabetes mellitus 11/14/2010   TYPE II  . Hematuria 05/2013   Urology, Dr. Estill Dooms  . Hyperlipidemia   . Hypertension   . Hypogonadism, male 05/2013   Dr. Estill Dooms, Urology  . Morbid obesity ([place])   . Obesity   . Obstructive sleep apnea   . Persistent atrial fibrillation ([place])    a. s/p afib ablation by [facility] 08/01/08  . Recommendation refused by patient    multiple recommended vaccines refused over the years (flu/pneumovax)  . Skin infection    chronic left leg herpetic   Past Surgical History:  Procedure Laterality Date  . ATRIAL ABLATION SURGERY  08/01/08   CTI and PVI ablation by [facility]  . COLONOSCOPY     2012 per patient  . ELECTROPHYSIOLOGIC STUDY N/A 11/30/2014   Procedure: Atrial Fibrillation Ablation;  Surgeon: [name], MD;  Location: [place] CV LAB;  Service: Cardiovascular;  Laterality: N/A;  . TEE WITHOUT CARDIOVERSION N/A 11/30/2014   Procedure: TRANSESOPHAGEAL ECHOCARDIOGRAM (TEE);  Surgeon: Josue Hector, MD;  Location: [facility] ENDOSCOPY;  Service: Cardiovascular;  Laterality: N/A;     Current Meds  Medication Sig  . ACCU-CHEK FASTCLIX LANCETS MISC Test blood sugar bid. Dx code E11.9  . acetaminophen (TYLENOL) 500 MG tablet Take 500 mg by mouth as needed for mild pain or headache.  . albuterol (VENTOLIN HFA) 108 (90 Base) MCG/ACT inhaler Inhale 2 puffs into the lungs every 6 (six) hours as needed for wheezing or shortness of breath.  . Alirocumab (PRALUENT) 75 MG/ML SOAJ INJECT THE CONTENTS OF 1 PEN INTO THE SKIN EVERY 14 DAYS (Patient taking  differently: Inject 75 mg into the skin every 14 (fourteen) days.)  . amLODipine (NORVASC) 5 MG tablet Take 1 tablet by mouth once daily (Patient taking differently: Take 5 mg by mouth daily.)  . apixaban (ELIQUIS) 5 MG TABS tablet Take 1 tablet (5 mg total) by mouth 2 (two) times daily. Please adhere to upcoming appointment.  [name] atorvastatin (LIPITOR) 80 MG tablet Take 1 tablet by mouth once daily  . b complex vitamins capsule Take 1 capsule by mouth every other day.   . BD PEN NEEDLE NANO 2ND GEN 32G X 4 MM MISC USE 1 PEN NEEDLE AT BEDTIME  . Cholecalciferol (VITAMIN D3) 10000 UNITS capsule Take 1 capsule (10,000 Units total) by mouth daily.  . clobetasol (TEMOVATE) 0.05 % external solution Apply 1 application topically daily as needed (rash).  [name] glucose blood (ACCU-CHEK AVIVA PLUS) test strip USE 1 STRIP TO CHECK GLUCOSE 1-2 TIMES DAILY  . insulin degludec (TRESIBA FLEXTOUCH) 100 UNIT/ML FlexTouch Pen Inject 0.15 mLs (15 Units total) into the skin at bedtime.  . isosorbide mononitrate (IMDUR) 30 MG 24 hr tablet Take 1 tablet by mouth once daily (Patient taking differently: Take 30 mg by mouth daily.)  . ketoconazole (NIZORAL) 2 % shampoo Apply 1 application topically See admin instructions. 2-3 times per week  . losartan (COZAAR) 100 MG tablet Take 1 tablet (100 mg total) by mouth daily. Call and schedule follow up office visit.  . metoprolol tartrate (LOPRESSOR) 50 MG tablet Take 1 tablet by mouth twice daily (May take 1 extra tablet if heart rate is 110 or higher).  [name] omega-3 acid ethyl esters (LOVAZA) 1 g capsule Take 2 capsules by mouth twice daily  . OZEMPIC, 1 MG/DOSE, 4 MG/3ML SOPN INJECT 1MG  SUBCUTANEOUSLY ONCE A WEEK (Patient taking differently: Inject 1 mg into the skin every Wednesday.)  . Semaglutide, 1 MG/DOSE, (OZEMPIC, 1 MG/DOSE,) 2 MG/1.5ML SOPN Inject 1 mg into the skin once a week.  . testosterone (ANDROGEL) 50 MG/5GM (1%) GEL APPLY 2 PACKETS TOPICALLY ONTO THE SKIN ONCE DAILY   . valACYclovir (VALTREX) 500 MG tablet Take 1 tablet by mouth once daily     Allergies:   Crestor [rosuvastatin], Zetia [ezetimibe], Lisinopril, Farxiga [dapagliflozin], and Metformin and related   Social History   Tobacco Use  . Smoking status: Former Smoker    Quit date: 07/24/1979    Years since quitting: 41.4  . Smokeless tobacco: Never Used  Vaping Use  . Vaping Use: Never used  Substance Use Topics  . Alcohol use: No    Comment: former  . Drug use: No    Comment: former     Family Hx: The patient's family history includes Heart disease  in his father and mother; Stroke in his father.  ROS:   Please see the history of present illness.     All other systems reviewed and are negative.   Labs/Other Tests and Data Reviewed:    Recent Labs: 11/30/2020: ALT 21; BUN 18; Creatinine, Ser 1.13; Hemoglobin 15.0; Magnesium 2.1; Platelets 244; Potassium 4.0; Sodium 136   Recent Lipid Panel Lab Results  Component Value Date/Time   CHOL 99 (L) 09/24/2019 08:58 AM   TRIG 158 (H) 09/24/2019 08:58 AM   HDL 40 09/24/2019 08:58 AM   CHOLHDL 2.5 09/24/2019 08:58 AM   CHOLHDL 3.3 07/30/2017 10:50 AM   LDLCALC 33 09/24/2019 08:58 AM   LDLCALC 65 07/30/2017 10:50 AM   LDLDIRECT 87.7 05/08/2013 08:37 AM    Wt Readings from Last 3 Encounters:  12/13/20 267 lb (121.1 kg)  12/06/20 269 lb 6.4 oz (122.2 kg)  07/27/20 285 lb (129.3 kg)     Objective:    Vital Signs:  BP (!) 142/80   Pulse 76   Ht 5' 10.5" (1.791 m)   Wt 267 lb (121.1 kg)   SpO2 96%   BMI 37.77 kg/m   GEN: Well nourished, well developed in no acute distress HEENT: Normal NECK: No JVD; No carotid bruits LYMPHATICS: No lymphadenopathy CARDIAC:RRR, no murmurs, rubs, gallops RESPIRATORY:  Clear to auscultation without rales, wheezing or rhonchi  ABDOMEN: Soft, non-tender, non-distended MUSCULOSKELETAL:  No edema; No deformity  SKIN: Warm and dry NEUROLOGIC:  Alert and oriented x 3 PSYCHIATRIC:  Normal affect     ASSESSMENT & PLAN:    1.  Persistent atrial fibrillation -he is maintaining NSR on exam but did have a breakthrough episode earlier in the month requiring DCCV but he attributes it to starting a new job -Continue prescription drug management with Lopressor 50mg  BID and Eliquis 5mg  BID>>refilled for 6 months -he has not had any bleeding problems on Eliquis -I have personally reviewed and interpreted outside labs performed by patient's PCP which showed Hbg 15 on 11/30/20   2.  Hypertension -BP is controlled on exam today -Continue prescription drug management with amlodipine 5mg  daily, Losartan 100mg  daily and Lopressor 50mg  BID>>refilled for 6 months  3.  ASCAD  -hx of-chronically occluded RCA s/p failed PCI with L>>R collaterals.   -he denies any anginal sx -continue on Imdur 30mg  daily, statin and BB -no ASA due to DOAC  4.  Chronic diastolic CHF -he has not had any SOB or LE edema recently and appears euvolemic on exam today I have personally reviewed and interpreted outside labs performed by patient's PCP which showed SCr 1.13, K+ 4 on 11/30/2020 -he has not required any diuretics   5. OSA - The patient is tolerating PAP therapy well without any problems. The PAP download performed by his DME was personally reviewed and interpreted by me today and showed an AHI of 0.4/hr on 16 cm H2O with 100% compliance in using more than 4 hours nightly.  The patient has been using and benefiting from PAP use and will continue to benefit from therapy.   6.  Morbid Obesity  -I have encouraged him to get into a routine exercise program and cut back on carbs and portions.   7.  Hyperlipidemia  -his LDL goal is < 70 -he has not had his lipids checked in over a year -I will get an FLP and ALT today -Continue prescription drug management with Atorvastatin 80mg  daily, Lovaza 2gm BID, Praluent  Medication Adjustments/Labs and  Tests Ordered: Current medicines are reviewed at length with the  patient today.  Concerns regarding medicines are outlined above.  Tests Ordered: No orders of the defined types were placed in this encounter.  Medication Changes: No orders of the defined types were placed in this encounter.   Disposition:  Follow up in 6 month(s)  Signed, Fransico Him, MD  12/13/2020 8:18 AM    [facility]

## 2020-12-13 NOTE — Patient Instructions (Signed)
Medication Instructions:  Your physician recommends that you continue on your current medications as directed. Please refer to the Current Medication list given to you today.  *If you need a refill on your cardiac medications before your next appointment, please call your pharmacy*   Lab Work: TODAY: BMET, CBC, FLP, ALT  If you have labs (blood work) drawn today and your tests are completely normal, you will receive your results only by: [name] MyChart Message (if you have MyChart) OR . A paper copy in the mail If you have any lab test that is abnormal or we need to change your treatment, we will call you to review the results.  Follow-Up: At [facility], you and your health needs are our priority.  As part of our continuing mission to provide you with exceptional heart care, we have created designated Provider Care Teams.  These Care Teams include your primary Cardiologist (physician) and Advanced Practice Providers (APPs -  Physician Assistants and Nurse Practitioners) who all work together to provide you with the care you need, when you need it.  Your next appointment:   6 month(s)  The format for your next appointment:   In Person  Provider:   You may see Fransico Him, MD or one of the following Advanced Practice Providers on your designated Care Team:    Melina Copa, PA-C  Ermalinda Barrios, PA-C

## 2021-01-11 ENCOUNTER — Other Ambulatory Visit: Payer: Self-pay

## 2021-01-11 ENCOUNTER — Encounter: Payer: Self-pay | Admitting: Medical

## 2021-01-11 ENCOUNTER — Ambulatory Visit (INDEPENDENT_AMBULATORY_CARE_PROVIDER_SITE_OTHER): Payer: Medicare Other | Admitting: Medical

## 2021-01-11 VITALS — BP 140/72 | HR 78 | Ht 71.0 in | Wt 268.2 lb

## 2021-01-11 DIAGNOSIS — E118 Type 2 diabetes mellitus with unspecified complications: Secondary | ICD-10-CM | POA: Diagnosis not present

## 2021-01-11 DIAGNOSIS — I4819 Other persistent atrial fibrillation: Secondary | ICD-10-CM

## 2021-01-11 DIAGNOSIS — R29898 Other symptoms and signs involving the musculoskeletal system: Secondary | ICD-10-CM

## 2021-01-11 DIAGNOSIS — E785 Hyperlipidemia, unspecified: Secondary | ICD-10-CM

## 2021-01-11 DIAGNOSIS — R279 Unspecified lack of coordination: Secondary | ICD-10-CM | POA: Diagnosis not present

## 2021-01-11 DIAGNOSIS — G4733 Obstructive sleep apnea (adult) (pediatric): Secondary | ICD-10-CM

## 2021-01-11 DIAGNOSIS — I2510 Atherosclerotic heart disease of native coronary artery without angina pectoris: Secondary | ICD-10-CM | POA: Diagnosis not present

## 2021-01-11 DIAGNOSIS — E782 Mixed hyperlipidemia: Secondary | ICD-10-CM | POA: Diagnosis not present

## 2021-01-11 DIAGNOSIS — E1169 Type 2 diabetes mellitus with other specified complication: Secondary | ICD-10-CM

## 2021-01-11 DIAGNOSIS — Z8679 Personal history of other diseases of the circulatory system: Secondary | ICD-10-CM | POA: Diagnosis not present

## 2021-01-11 DIAGNOSIS — Z7901 Long term (current) use of anticoagulants: Secondary | ICD-10-CM

## 2021-01-11 DIAGNOSIS — I5032 Chronic diastolic (congestive) heart failure: Secondary | ICD-10-CM | POA: Diagnosis not present

## 2021-01-11 DIAGNOSIS — E559 Vitamin D deficiency, unspecified: Secondary | ICD-10-CM | POA: Diagnosis not present

## 2021-01-11 DIAGNOSIS — E291 Testicular hypofunction: Secondary | ICD-10-CM | POA: Diagnosis not present

## 2021-01-11 DIAGNOSIS — I10 Essential (primary) hypertension: Secondary | ICD-10-CM | POA: Diagnosis not present

## 2021-01-11 DIAGNOSIS — M65332 Trigger finger, left middle finger: Secondary | ICD-10-CM | POA: Diagnosis not present

## 2021-01-11 NOTE — Progress Notes (Addendum)
Subjective:  [name] is a 70 y.o. male who presents for Chief Complaint  Patient presents with   Hand Problem    Issues closing hands    Diabetes      Medical team: Dentist, Dr. Lemmie [facility]  Eye doctor, Dr. Quay Burow Cardiology, Dr. Alexander Mt, NP at [facility]  Urology, Dr. Denyce Robert Dermatology, Dr. Gloris Manchester    Concerns: Here for concerns about left hand not wanting to close all the way for the past 6 months or so.  Has had some trigger finger as well, left middle.  Right hand is starting to do some of the same.  No injury, no trauma.  No swelling of fingers, wrist.  No pain, just can't close hands.  Doesn't necessarily feel weak, has a tightness. No numbness,s no tingling.  No new neck pain, no upper arm issues similar.  Right handed.  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  Diabetes-compliant with medication, Tresiba 15 units daily, Ozempic weekly, he does check feet and no concerns.  His wife cut his toenails yesterday.  He sees his eye doctor regularly.  No polyuria, polydipsia, vision change  He continues on testosterone therapy, managed by urology.  PSA was checked in January 2022  He continues with his routine follow-up with cardiology  Hypertension-compliant with medications without complaint  Hyperlipidemia-compliant with medication without complaint, on Praluent and atorvastatin  History of atrial fibrillation, on anticoagulation  No other aggravating or relieving factors.    No other c/o.  Past Medical History:  Diagnosis Date   Atrial flutter ([place])    a. atypical atrial flutter.   Bell's palsy    CAD (coronary artery disease)    a. 07/2009 Cath: LM nl, LAD nl, LCX nl w/ L->R collats, chronically occluded RCA s/p failed PCI;  b. 07/2012 MV: basal inf mild ischemia.   Central retinal artery occlusion, left eye    diagnosed in 2008    Cerebrovascular accident [facility])    a. 03/2008 secondary to cardioembolic event;  b. chronic coumadin.   Chronic diastolic CHF (congestive heart failure) ([place])    a. 12/2008 Echo: nl EF.   Diabetes mellitus 11/14/2010   TYPE II   Hematuria 05/2013   Urology, Dr. Estill Dooms   Hyperlipidemia    Hypertension    Hypogonadism, male 05/2013   Dr. Estill Dooms, Urology   Morbid obesity [facility])    Obesity    Obstructive sleep apnea    Persistent atrial fibrillation ([place])    a. s/p afib ablation by [facility] 08/01/08   Recommendation refused by patient    multiple recommended vaccines refused over the years (flu/pneumovax)   Skin infection    chronic left leg herpetic   Current Outpatient Medications on File Prior to Visit  Medication Sig Dispense Refill   ACCU-CHEK FASTCLIX LANCETS MISC Test blood sugar bid. Dx code E11.9 102 each 1   acetaminophen (TYLENOL) 500 MG tablet Take 500 mg by mouth as needed for mild pain or headache.     Alirocumab (PRALUENT) 75 MG/ML SOAJ Inject 75 mg into the skin every 14 (fourteen) days. 6 mL 1   amLODipine (NORVASC) 5 MG tablet Take 1 tablet (5 mg total) by mouth daily. 90 tablet 1   apixaban (ELIQUIS) 5 MG TABS tablet Take 1 tablet (5 mg total) by mouth 2 (two) times daily. Please adhere to upcoming appointment. 60 tablet 1  atorvastatin (LIPITOR) 80 MG tablet Take 1 tablet (80 mg total) by mouth daily. 90 tablet 1   b complex vitamins capsule Take 1 capsule by mouth every other day.      BD PEN NEEDLE NANO 2ND GEN 32G X 4 MM MISC USE 1 PEN NEEDLE AT BEDTIME 100 each 0   Cholecalciferol (VITAMIN D3) 10000 UNITS capsule Take 1 capsule (10,000 Units total) by mouth daily. 4 capsule 0   clobetasol (TEMOVATE) 0.05 % external solution Apply 1 application topically daily as needed (rash).     glucose blood (ACCU-CHEK AVIVA PLUS) test strip USE 1 STRIP TO CHECK GLUCOSE 1-2 TIMES DAILY 100 each 0   insulin degludec (TRESIBA FLEXTOUCH) 100 UNIT/ML FlexTouch Pen Inject 0.15 mLs (15 Units  total) into the skin at bedtime. 15 mL 5   isosorbide mononitrate (IMDUR) 30 MG 24 hr tablet Take 1 tablet (30 mg total) by mouth daily. 90 tablet 1   ketoconazole (NIZORAL) 2 % shampoo Apply 1 application topically See admin instructions. 2-3 times per week     losartan (COZAAR) 100 MG tablet Take 1 tablet (100 mg total) by mouth daily. Call and schedule follow up office visit. 90 tablet 1   metoprolol tartrate (LOPRESSOR) 50 MG tablet Take 1 tablet by mouth twice daily (May take 1 extra tablet if heart rate is 110 or higher). 75 tablet 6   omega-3 acid ethyl esters (LOVAZA) 1 g capsule Take 2 capsules (2 g total) by mouth 2 (two) times daily. 360 capsule 1   OZEMPIC, 1 MG/DOSE, 4 MG/3ML SOPN INJECT 1MG  SUBCUTANEOUSLY ONCE A WEEK (Patient taking differently: Inject 1 mg into the skin every Wednesday.) 3 mL 1   testosterone (ANDROGEL) 50 MG/5GM (1%) GEL APPLY 2 PACKETS TOPICALLY ONTO THE SKIN ONCE DAILY     No current facility-administered medications on file prior to visit.     The following portions of the patient's history were reviewed and updated as appropriate: allergies, current medications, past family history, past medical history, past social history, past surgical history and problem list.  ROS Otherwise as in subjective above    Objective: BP 140/72   Pulse 78   Ht 5\' 11"  (1.803 m)   Wt 268 lb 3.2 oz (121.7 kg)   SpO2 95%   BMI 37.41 kg/m   Wt Readings from Last 3 Encounters:  01/11/21 268 lb 3.2 oz (121.7 kg)  12/13/20 267 lb (121.1 kg)  12/06/20 269 lb 6.4 oz (122.2 kg)    General appearance: alert, no distress, well developed, well nourished Neck: supple, no lymphadenopathy, no thyromegaly, no masses Heart: RRR, normal S1, S2, no murmurs Lungs: CTA bilaterally, no wheezes, rhonchi, or rales Pulses: 2+ radial pulses, 2+ pedal pulses, normal cap refill Ext: no edema MSK: Hands bilaterally without swelling or deformity, somewhat larger hand appearance in general,  no obvious erythema or warmth or discoloration, cap refill and pulses normal both hands and fingers, negative Tinel's and Phalen's, however he is unable to fully flex his fingers all the way to the hand, however when actively close his fingers he does not have pain and I am able to flex fingers all the way inward so no obvious contracture or other tendinopathy, but overall his second through fifth fingers on the left hand seems slightly weak to fully flex the fingers compared to the right, thumbs bilaterally seen normal strength noted, extension seems a little stronger on the left compared to flexion.   Diabetic Foot Exam -  Simple   Simple Foot Form Diabetic Foot exam was performed with the following findings: Yes 01/11/2021 12:08 PM  Visual Inspection No deformities, no ulcerations, no other skin breakdown bilaterally: Yes Sensation Testing See comments: Yes Pulse Check Posterior Tibialis and Dorsalis pulse intact bilaterally: Yes Comments Decreased sensation left foot with monofilament of middle and lateral toes, metatarsal region, otherise great toe and rest of monofilament exam ok      Assessment: Encounter Diagnoses  Name Primary?   Hand weakness Yes   Trigger middle finger of left hand    Diabetes mellitus with complication (HCC)    Essential hypertension, benign    Hypogonadism male    Mixed dyslipidemia    Unspecified lack of coordination     Anticoagulant long-term use    Coronary artery disease involving native coronary artery of native heart without angina pectoris    Chronic diastolic heart failure (HCC)    History of atrial fibrillation      Plan: Hand weakness, trigger finger -I suspect neuropathy such as carpal tunnel and/or ulnar tunnel syndrome.  No obvious sign of radicular issue from the neck.  No obvious arthritis.  He is a job where he has been doing more repetitive motion in the last several months which probably have aggravated the syndrome.  Advise he begin  reinforced wrist splints at night, begin over-the-counter Aleve twice daily for the next week.  Referral to orthopedics   Diabetes -labs today, continue glucose monitoring but do this more frequently current.  Advise yearly eye doctor follow-up, daily foot checks.  Continue Tresiba 15 units daily and Ozempic weekly injection  Hypertension, CAD, history of heart failure, persistent atrial fibrillation-managed by cardiology.  I reviewed his May/24/22 cardiology consult notes.  He continues on therapy listed above medications  Hypogonadism -this is managed by urology.  He had a normal PSA in January reviewed in care everywhere  Mixed dyslipidemai associated with diabetes-I reviewed his May 2022 lipid panel.  He continues on medication with good compliance  Long term anticoagulation -on therapy without bleeding or complaint, management cardiology  Obesity-we had discussed exercise, healthy diet and efforts to lose weight in the past.  This has not really changed, and I do not think he is very motivated to make any significant attempts in this regard  Jax was seen today for hand problem and diabetes.  Diagnoses and all orders for this visit:  Hand weakness -     Vitamin B12 -     CK -     AMB referral to orthopedics  Trigger middle finger of left hand -     Vitamin B12 -     CK -     AMB referral to orthopedics  Diabetes mellitus with complication (HCC) -     Hemoglobin A1c -     Microalbumin/Creatinine Ratio, Urine  Essential hypertension, benign  Hypogonadism male  Mixed dyslipidemia  Unspecified lack of coordination  -     Vitamin B12  Anticoagulant long-term use  Coronary artery disease involving native coronary artery of native heart without angina pectoris  Chronic diastolic heart failure (HCC)  History of atrial fibrillation  Spent > 45 minutes face to face with patient in discussion of symptoms, evaluation, plan and recommendations.     Follow up: pending  labs, referral

## 2021-01-12 ENCOUNTER — Other Ambulatory Visit: Payer: Self-pay | Admitting: Medical

## 2021-01-12 LAB — HEMOGLOBIN A1C
Est. average glucose Bld gHb Est-mCnc: 160 mg/dL
Hgb A1c MFr Bld: 7.2 % — ABNORMAL HIGH (ref 4.8–5.6)

## 2021-01-12 LAB — CK: Total CK: 62 U/L (ref 41–331)

## 2021-01-12 LAB — MICROALBUMIN / CREATININE URINE RATIO
Creatinine, Urine: 61 mg/dL
Microalb/Creat Ratio: 30 mg/g creat — ABNORMAL HIGH (ref 0–29)
Microalbumin, Urine: 18.2 ug/mL

## 2021-01-12 LAB — VITAMIN B12: Vitamin B-12: 126 pg/mL — ABNORMAL LOW (ref 232–1245)

## 2021-01-12 MED ORDER — OZEMPIC (0.25 OR 0.5 MG/DOSE) 2 MG/1.5ML ~~LOC~~ SOPN: 0.5000 mg | PEN_INJECTOR | SUBCUTANEOUS | 5 refills | Status: DC

## 2021-01-12 MED ORDER — BD PEN NEEDLE NANO 2ND GEN 32G X 4 MM MISC: 5 refills | Status: DC

## 2021-01-12 MED ORDER — TRESIBA FLEXTOUCH 100 UNIT/ML ~~LOC~~ SOPN: 18.0000 [IU] | PEN_INJECTOR | Freq: Every day | SUBCUTANEOUS | 5 refills | Status: DC

## 2021-01-12 MED ORDER — VITAMIN B-12 1000 MCG PO TABS: 1000.0000 ug | ORAL_TABLET | Freq: Every day | ORAL | 0 refills | Status: DC

## 2021-01-24 DIAGNOSIS — M65332 Trigger finger, left middle finger: Secondary | ICD-10-CM | POA: Diagnosis not present

## 2021-01-26 ENCOUNTER — Ambulatory Visit (INDEPENDENT_AMBULATORY_CARE_PROVIDER_SITE_OTHER): Payer: Medicare Other | Admitting: Medical

## 2021-01-26 ENCOUNTER — Encounter: Payer: Self-pay | Admitting: Medical

## 2021-01-26 ENCOUNTER — Other Ambulatory Visit: Payer: Self-pay

## 2021-01-26 VITALS — BP 120/70 | HR 83 | Ht 70.0 in | Wt 269.2 lb

## 2021-01-26 DIAGNOSIS — I2510 Atherosclerotic heart disease of native coronary artery without angina pectoris: Secondary | ICD-10-CM

## 2021-01-26 DIAGNOSIS — Z2820 Immunization not carried out because of patient decision for unspecified reason: Secondary | ICD-10-CM

## 2021-01-26 DIAGNOSIS — Z0000 Encounter for general adult medical examination without abnormal findings: Secondary | ICD-10-CM

## 2021-01-26 DIAGNOSIS — I10 Essential (primary) hypertension: Secondary | ICD-10-CM

## 2021-01-26 DIAGNOSIS — E291 Testicular hypofunction: Secondary | ICD-10-CM

## 2021-01-26 DIAGNOSIS — M65332 Trigger finger, left middle finger: Secondary | ICD-10-CM

## 2021-01-26 DIAGNOSIS — D126 Benign neoplasm of colon, unspecified: Secondary | ICD-10-CM

## 2021-01-26 DIAGNOSIS — E782 Mixed hyperlipidemia: Secondary | ICD-10-CM

## 2021-01-26 DIAGNOSIS — I4819 Other persistent atrial fibrillation: Secondary | ICD-10-CM

## 2021-01-26 DIAGNOSIS — E538 Deficiency of other specified B group vitamins: Secondary | ICD-10-CM | POA: Insufficient documentation

## 2021-01-26 DIAGNOSIS — E559 Vitamin D deficiency, unspecified: Secondary | ICD-10-CM

## 2021-01-26 DIAGNOSIS — E118 Type 2 diabetes mellitus with unspecified complications: Secondary | ICD-10-CM

## 2021-01-26 DIAGNOSIS — I5032 Chronic diastolic (congestive) heart failure: Secondary | ICD-10-CM

## 2021-01-26 DIAGNOSIS — E785 Hyperlipidemia, unspecified: Secondary | ICD-10-CM

## 2021-01-26 DIAGNOSIS — Z8679 Personal history of other diseases of the circulatory system: Secondary | ICD-10-CM

## 2021-01-26 DIAGNOSIS — R29898 Other symptoms and signs involving the musculoskeletal system: Secondary | ICD-10-CM

## 2021-01-26 DIAGNOSIS — Z7901 Long term (current) use of anticoagulants: Secondary | ICD-10-CM

## 2021-01-26 DIAGNOSIS — G4733 Obstructive sleep apnea (adult) (pediatric): Secondary | ICD-10-CM

## 2021-01-26 DIAGNOSIS — E1169 Type 2 diabetes mellitus with other specified complication: Secondary | ICD-10-CM

## 2021-01-26 DIAGNOSIS — N529 Male erectile dysfunction, unspecified: Secondary | ICD-10-CM

## 2021-01-26 MED ORDER — ACCU-CHEK FASTCLIX LANCETS MISC: 11 refills | Status: DC

## 2021-01-26 MED ORDER — CYANOCOBALAMIN 1000 MCG/ML IJ SOLN: 1000.0000 ug | Freq: Once | INTRAMUSCULAR | 4 refills | Status: AC

## 2021-01-26 MED ORDER — OZEMPIC (2 MG/DOSE) 8 MG/3ML ~~LOC~~ SOPN: 2.0000 mg | PEN_INJECTOR | SUBCUTANEOUS | 5 refills | Status: DC

## 2021-01-26 NOTE — Progress Notes (Signed)
Subjective:    [name] is a 70 y.o. male who presents for Preventative Services visit and chronic medical problems/med check visit.    Primary Care Provider Tysinger, Camelia Eng, PA-C here for primary care  Current Health Care Team: Dentist, Dr. Lemmie Evens Dental  Cardiology, Dr. Fransico Him Dr. [name], Electrophysiology Roderic Palau, NP at [facility] Urology, Dr. Denyce Robert Dermatology, Dr. Gloris Manchester Dr. Carol Ada, [place] you may have received from other than [facility] providers in the past year (date may be approximate) Dr. Radford Pax- Cardiology All[place]- Cardiology Dr. Estill Dooms- urology  Exercise Current exercise habits:  works partime doing a lot of walking 3 times a week  , loading trucks at the [employer]  Nutrition/Diet Current diet: in general, a "healthy" diet    Depression Screen Depression screen [facility] 2/9 01/26/2021  Decreased Interest 0  Down, Depressed, Hopeless 0  PHQ - 2 Score 0  Some recent data might be hidden    Activities of Daily Living Screen/Functional Status Survey Is the patient deaf or have difficulty hearing?: No Does the patient have difficulty seeing, even when wearing glasses/contacts?: No Does the patient have difficulty concentrating, remembering, or making decisions?: No Does the patient have difficulty walking or climbing stairs?: No Does the patient have difficulty dressing or bathing?: No Does the patient have difficulty doing errands alone such as visiting a doctor's office or shopping?: No  Can patient draw a clock face showing 3:15 oclock, yes  Fall Risk Screen Fall Risk  01/26/2021 01/11/2021 12/11/2018 08/22/2018 02/11/2018  Falls in the past year? 0 0 0 0 No  Number falls in past yr: 0 0 - - -  Injury with Fall? 0 0 - - -  Risk for fall due to : No Fall Risks No Fall Risks - - -  Follow up Falls evaluation completed Falls evaluation completed Falls evaluation completed - -    Gait Assessment: Normal  gait observed - yes  Advanced directives Does patient have a [place]? No Does patient have a Living Will? No    Past Medical History:  Diagnosis Date   Atrial flutter ([place])    a. atypical atrial flutter.   Bell's palsy    CAD (coronary artery disease)    a. 07/2009 Cath: LM nl, LAD nl, LCX nl w/ L->R collats, chronically occluded RCA s/p failed PCI;  b. 07/2012 MV: basal inf mild ischemia.   Central retinal artery occlusion, left eye    diagnosed in 2008   Cerebrovascular accident [facility])    a. 03/2008 secondary to cardioembolic event;  b. chronic coumadin.   Chronic diastolic CHF (congestive heart failure) ([place])    a. 12/2008 Echo: nl EF.   Diabetes mellitus 11/14/2010   TYPE II   Hematuria 05/2013   Urology, Dr. Estill Dooms   Hyperlipidemia    Hypertension    Hypogonadism, male 05/2013   Dr. Estill Dooms, Urology   Morbid obesity [facility])    Obesity    Obstructive sleep apnea    Persistent atrial fibrillation ([place])    a. s/p afib ablation by [facility] 08/01/08   Recommendation refused by patient    multiple recommended vaccines refused over the years (flu/pneumovax)   Skin infection    chronic left leg herpetic    Past Surgical History:  Procedure Laterality Date   ATRIAL ABLATION SURGERY  08/01/08   CTI and PVI ablation by [facility]   COLONOSCOPY     2012 per patient  ELECTROPHYSIOLOGIC STUDY N/A 11/30/2014   Procedure: Atrial Fibrillation Ablation;  Surgeon: [name], MD;  Location: [place] CV LAB;  Service: Cardiovascular;  Laterality: N/A;   TEE WITHOUT CARDIOVERSION N/A 11/30/2014   Procedure: TRANSESOPHAGEAL ECHOCARDIOGRAM (TEE);  Surgeon: Josue Hector, MD;  Location: [facility] ENDOSCOPY;  Service: Cardiovascular;  Laterality: N/A;    Social History   Socioeconomic History   Marital status: Married    Spouse name: Not on file   Number of children: Not on file   Years of education: Not on file   Highest education level: Not on file  Occupational History   Not on  file  Tobacco Use   Smoking status: Former    Pack years: 0.00    Types: Cigarettes    Quit date: 07/24/1979    Years since quitting: 41.5   Smokeless tobacco: Never  Vaping Use   Vaping Use: Never used  Substance and Sexual Activity   Alcohol use: No    Comment: former   Drug use: No    Comment: former   Sexual activity: Not on file  Other Topics Concern   Not on file  Social History Narrative   Lives in [place], [place]. With his spouse and son. Has remote history of tobacco, but quit 30 years ago. Has heavy history of alcohol consumption but quit 30 years ago. Previously used marijuana and cocaine, but denies use over the past 30 years. 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    Social Determinants of Health   Financial Resource Strain: Not on file  Food Insecurity: Not on file  Transportation Needs: Not on file  Physical Activity: Not on file  Stress: Not on file  Social Connections: Not on file  Intimate Partner Violence: Not on file    Family History  Problem Relation Age of Onset   Stroke Father    Heart disease Father    Heart disease Mother      Current Outpatient Medications:    acetaminophen (TYLENOL) 500 MG tablet, Take 500 mg by mouth as needed for mild pain or headache., Disp: , Rfl:    Alirocumab (PRALUENT) 75 MG/ML SOAJ, Inject 75 mg into the skin every 14 (fourteen) days., Disp: 6 mL, Rfl: 1   amLODipine (NORVASC) 5 MG tablet, Take 1 tablet (5 mg total) by mouth daily., Disp: 90 tablet, Rfl: 1   apixaban (ELIQUIS) 5 MG TABS tablet, Take 1 tablet (5 mg total) by mouth 2 (two) times daily. Please adhere to upcoming appointment., Disp: 60 tablet, Rfl: 1   atorvastatin (LIPITOR) 80 MG tablet, Take 1 tablet (80 mg total) by mouth daily., Disp: 90 tablet, Rfl: 1   b complex vitamins capsule, Take 1 capsule by mouth every other day. , Disp: , Rfl:    Cholecalciferol (VITAMIN D3) 10000 UNITS capsule, Take 1 capsule (10,000 Units total) by mouth daily.,  Disp: 4 capsule, Rfl: 0   clobetasol (TEMOVATE) 0.05 % external solution, Apply 1 application topically daily as needed (rash)., Disp: , Rfl:    cyanocobalamin (,VITAMIN B-12,) 1000 MCG/ML injection, Inject 1 mL (1,000 mcg total) into the muscle once for 1 dose. Every 2 weeks, Disp: 2 mL, Rfl: 4   glucose blood (ACCU-CHEK AVIVA PLUS) test strip, USE 1 STRIP TO CHECK GLUCOSE 1-2 TIMES DAILY, Disp: 100 each, Rfl: 0   insulin degludec (TRESIBA FLEXTOUCH) 100 UNIT/ML FlexTouch Pen, Inject 18 Units into the skin at bedtime., Disp: 15 mL, Rfl: 5   Insulin Pen Needle (BD  PEN NEEDLE NANO 2ND GEN) 32G X 4 MM MISC, USE 1 PEN NEEDLE AT BEDTIME, Disp: 100 each, Rfl: 5   isosorbide mononitrate (IMDUR) 30 MG 24 hr tablet, Take 1 tablet (30 mg total) by mouth daily., Disp: 90 tablet, Rfl: 1   losartan (COZAAR) 100 MG tablet, Take 1 tablet (100 mg total) by mouth daily. Call and schedule follow up office visit., Disp: 90 tablet, Rfl: 1   metoprolol tartrate (LOPRESSOR) 50 MG tablet, Take 1 tablet by mouth twice daily (May take 1 extra tablet if heart rate is 110 or higher)., Disp: 75 tablet, Rfl: 6   omega-3 acid ethyl esters (LOVAZA) 1 g capsule, Take 2 capsules (2 g total) by mouth 2 (two) times daily., Disp: 360 capsule, Rfl: 1   Semaglutide, 2 MG/DOSE, (OZEMPIC, 2 MG/DOSE,) 8 MG/3ML SOPN, Inject 2 mg into the skin once a week., Disp: 3 mL, Rfl: 5   testosterone (ANDROGEL) 50 MG/5GM (1%) GEL, APPLY 2 PACKETS TOPICALLY ONTO THE SKIN ONCE DAILY, Disp: , Rfl:    vitamin B-12 (CYANOCOBALAMIN) 1000 MCG tablet, Take 1 tablet (1,000 mcg total) by mouth daily., Disp: 90 tablet, Rfl: 0   Accu-Chek FastClix Lancets MISC, Test blood sugar bid. Dx code E11.9, Disp: 100 each, Rfl: 11  Allergies  Allergen Reactions   Crestor [Rosuvastatin] Other (See Comments)    Made him feel crazy   Zetia [Ezetimibe] Other (See Comments)    Made him feel crazy   Lisinopril Cough   Farxiga [Dapagliflozin]     Made heart race    Metformin And Related     Mental fog    History reviewed: allergies, current medications, past family history, past medical history, past social history, past surgical history and problem list  Chronic issues discussed: He came in recently for a med check chronic disease follow-up.  See those notes specifically  Acute issues discussed: None other than there may have been a discrepancy on his medicine sent to the pharmacy, Ozempic from last visit  Objective:      Biometrics BP 120/70   Pulse 83   Ht 5\' 10"  (1.778 m)   Wt 269 lb 3.2 oz (122.1 kg)   BMI 38.63 kg/m   Cognitive Testing  Alert? Yes  Normal Appearance?Yes  Oriented to person? Yes  Place? Yes   Time? Yes  Recall of three objects?  Yes  Can perform simple calculations? Yes  Displays appropriate judgment?Yes  Can read the correct time from a watch face?Yes  General appearance: alert, no distress, WD/WN, white male  Nutritional Status: Inadequate calore intake? no Loss of muscle mass? no Loss of fat beneath skin? no Localized or general edema? no Diminished functional status? no  Other pertinent exam: Neck: supple, no lymphadenopathy, no thyromegaly, no masses Heart: RRR, normal S1, S2, no murmurs Lungs: CTA bilaterally, no wheezes, rhonchi, or rales Extremities: no edema, no cyanosis, no clubbing Pulses: 2+ symmetric, upper and lower extremities, normal cap refill Psychiatric: normal affect, behavior normal, pleasant    Assessment:   Encounter Diagnoses  Name Primary?   Medicare annual wellness visit, subsequent Yes   Mixed dyslipidemia    Morbid obesity ([place])    OSA (obstructive sleep apnea)    Persistent atrial fibrillation (HCC)    Anticoagulant long-term use    Coronary artery disease involving native coronary artery of native heart without angina pectoris    Chronic diastolic heart failure ([place])    Dyslipidemia associated with type 2 diabetes mellitus ([place])  Diabetes mellitus with  complication (HCC)    Essential hypertension, benign    Hand weakness    History of atrial fibrillation    Hypogonadism male    Impotence of organic origin    Vitamin D deficiency    Vaccine refused by patient    Tubular adenoma of colon    Trigger middle finger of left hand    B12 deficiency      Plan:   A preventative services visit was completed today.  During the course of the visit today, we discussed and counseled about appropriate screening and preventive services.  A health risk assessment was established today that included a review of current medications, allergies, social history, family history, medical and preventative health history, biometrics, and preventative screenings to identify potential safety concerns or impairments.  A personalized plan was printed today for your records and use.   Personalized health advice and education was given today to reduce health risks and promote self management and wellness.  Information regarding end of life planning was discussed today.    This visit was a preventative care visit, also known as wellness visit or routine physical.   Topics typically include healthy lifestyle, diet, exercise, preventative care, vaccinations, sick and well care, proper use of emergency dept and after hours care, as well as other concerns.     Advanced directives - discussed nature and purpose of Advanced Directives, encouraged them to complete them if they have not done so and/or encouraged them to get [place] a copy if they have done this already.   Recommendations: Continue to return yearly for your annual wellness and preventative care visits.  This gives [place] a chance to discuss healthy lifestyle, exercise, vaccinations, review your chart record, and perform screenings where appropriate.  I recommend you see your eye doctor yearly for routine vision care.  I recommend you see your dentist yearly for routine dental care including hygiene visits twice  yearly.   Vaccination recommendations were reviewed  There is no immunization history on file for this patient.  you declined vaccines today  Vaccine recommendations includes pneumococcal vaccine, shingles vaccine, tetanus vaccine, flu shot   Screening for cancer: Colon cancer screening: I recommend referral back to Dr. Benson [place].  You expressed interest in possibly seeing the gastroenterologist that your wife sees.  Let me know which you want to be referred to  We discussed PSA, prostate exam, and prostate cancer screening risks/benefits.   He sees urology for screening  Skin cancer screening: Check your skin regularly for new changes, growing lesions, or other lesions of concern Come in for evaluation if you have skin lesions of concern.  Lung cancer screening: If you have a greater than 20 pack year history of tobacco use, then you may qualify for lung cancer screening with a chest CT scan.   Please call your insurance company to inquire about coverage for this test.  We currently don't have screenings for other cancers besides breast, cervical, colon, and lung cancers.  If you have a strong family history of cancer or have other cancer screening concerns, please let me know.    Bone health: Get at least 150 minutes of aerobic exercise weekly Get weight bearing exercise at least once weekly Bone density test:  A bone density test is an imaging test that uses a type of X-ray to measure the amount of calcium and other minerals in your bones. The test may be used to diagnose or screen you for a condition  that causes weak or thin bones (osteoporosis), predict your risk for a broken bone (fracture), or determine how well your osteoporosis treatment is working. The bone density test is recommended for females 56 and older, or females or males <00 if certain risk factors such as thyroid disease, long term use of steroids such as for asthma or rheumatological issues, vitamin D deficiency,  estrogen deficiency, family history of osteoporosis, self or family history of fragility fracture in first degree relative.    Heart health: Get at least 150 minutes of aerobic exercise weekly Limit alcohol It is important to maintain a healthy blood pressure and healthy cholesterol numbers  You are seeing cardiology regularly    Medical care options: I recommend you continue to seek care here first for routine care.  We try really hard to have available appointments Monday through Friday daytime hours for sick visits, acute visits, and physicals.  Urgent care should be used for after hours and weekends for significant issues that cannot wait till the next day.  The emergency department should be used for significant potentially life-threatening emergencies.  The emergency department is expensive, can often have long wait times for less significant concerns, so try to utilize primary care, urgent care, or telemedicine when possible to avoid unnecessary trips to the emergency department.  Virtual visits and telemedicine have been introduced since the pandemic started in 2020, and can be convenient ways to receive medical care.  We offer virtual appointments as well to assist you in a variety of options to seek medical care.    Separate significant issues discussed: We cleared up the discrepancy with his medication refill recently.  See recent 01/11/21 notes for med check and chronic disease management.  Hand weakness, trigger finger -I reviewed his recent orthopedic notes, and he had recent steroid injection of finger for trigger finger issue    Diabetes -continue glucose monitoring but do this more frequently current.  Advise yearly eye doctor follow-up, daily foot checks.     Hypertension, CAD, history of heart failure, persistent atrial fibrillation-managed by cardiology.  I reviewed his May/24/22 cardiology consult notes.  He continues on therapy listed above medications   Hypogonadism  -this is managed by urology.  He had a normal PSA in January reviewed in care everywhere   Mixed dyslipidemia associated with diabetes-I reviewed his May 2022 lipid panel.  He continues on medication with good compliance   Long term anticoagulation -on therapy without bleeding or complaint, management cardiology   Obesity-we had discussed exercise, healthy diet and efforts to lose weight in the past.  This has not really changed, and I do not think he is very motivated to make any significant attempts in this regard   Patient Instructions  Diabetes Increase the [place] long acting insulin to 18u daily.  This is being increased from 15units. I changed your Ozempic to 2mg  weekly shot.   You were on 1mg  prior.   We inadvertently sent 0.5mg  shot last visit which was not correct.   I apologize.  So instead of wasting this, use 2 doses per week until you run out, then change to the new dose of 2mg  weekly which I sent to pharmacy today.   I want to recheck your sugars markers in 3 months after being on the dosing above.   B12 was low last visit Begin B12 supplement tablet daily Begin B12 injection, 1 shot every 2 weeks for 2 months.  You report your wife understands how to use this  Then lets go to monthly injection I will recheck B12 in 3 months along with diabetes follow up   Work on completing your will, health care power of attorney and living will paperwork.  Get [place] copy of the medical forms  You are due back to see Dr. Allene Pyo now.  Let me know if we can refer or if you plan to see different specialist, we need to know whom.   Recheck in 3 months fasting             Shaughn was seen today for awv.  Diagnoses and all orders for this visit:  Medicare annual wellness visit, subsequent  Mixed dyslipidemia  Morbid obesity ([place])  OSA (obstructive sleep apnea)  Persistent atrial fibrillation (HCC)  Anticoagulant long-term use  Coronary artery disease involving native  coronary artery of native heart without angina pectoris  Chronic diastolic heart failure ([place])  Dyslipidemia associated with type 2 diabetes mellitus ([place])  Diabetes mellitus with complication (HCC)  Essential hypertension, benign  Hand weakness  History of atrial fibrillation  Hypogonadism male  Impotence of organic origin  Vitamin D deficiency  Vaccine refused by patient  Tubular adenoma of colon  Trigger middle finger of left hand  B12 deficiency  Other orders -     Semaglutide, 2 MG/DOSE, (OZEMPIC, 2 MG/DOSE,) 8 MG/3ML SOPN; Inject 2 mg into the skin once a week. -     cyanocobalamin (,VITAMIN B-12,) 1000 MCG/ML injection; Inject 1 mL (1,000 mcg total) into the muscle once for 1 dose. Every 2 weeks -     Accu-Chek FastClix Lancets MISC; Test blood sugar bid. Dx code E11.9    Medicare Attestation A preventative services visit was completed today.  During the course of the visit the patient was educated and counseled about appropriate screening and preventive services.  A health risk assessment was established with the patient that included a review of current medications, allergies, social history, family history, medical and preventative health history, biometrics, and preventative screenings to identify potential safety concerns or impairments.  A personalized plan was printed today for the patient's records and use.   Personalized health advice and education was given today to reduce health risks and promote self management and wellness.  Information regarding end of life planning was discussed today.  Dorothea Ogle, PA-C   01/26/2021

## 2021-01-26 NOTE — Patient Instructions (Addendum)
Diabetes Increase the [place] long acting insulin to 18u daily.  This is being increased from 15units. I changed your Ozempic to 2mg  weekly shot.   You were on 1mg  prior.   We inadvertently sent 0.5mg  shot last visit which was not correct.   I apologize.  So instead of wasting this, use 2 doses per week until you run out, then change to the new dose of 2mg  weekly which I sent to pharmacy today.   I want to recheck your sugars markers in 3 months after being on the dosing above.   B12 was low last visit Begin B12 supplement tablet daily Begin B12 injection, 1 shot every 2 weeks for 2 months.  You report your wife understands how to use this Then lets go to monthly injection I will recheck B12 in 3 months along with diabetes follow up   Work on completing your will, health care power of attorney and living will paperwork.  Get [place] copy of the medical forms  You are due back to see Dr. Allene Pyo now.  Let me know if we can refer or if you plan to see different specialist, we need to know whom.   Recheck in 3 months fasting

## 2021-02-02 ENCOUNTER — Telehealth: Payer: Self-pay

## 2021-02-02 NOTE — Telephone Encounter (Signed)
Called [facility] to see if pt has or does not have retinopathy. [place]

## 2021-02-03 ENCOUNTER — Encounter: Payer: Self-pay | Admitting: Internal Medicine

## 2021-02-08 DIAGNOSIS — D485 Neoplasm of uncertain behavior of skin: Secondary | ICD-10-CM | POA: Diagnosis not present

## 2021-02-08 DIAGNOSIS — L821 Other seborrheic keratosis: Secondary | ICD-10-CM | POA: Diagnosis not present

## 2021-02-08 DIAGNOSIS — D225 Melanocytic nevi of trunk: Secondary | ICD-10-CM | POA: Diagnosis not present

## 2021-02-08 DIAGNOSIS — L570 Actinic keratosis: Secondary | ICD-10-CM | POA: Diagnosis not present

## 2021-02-08 DIAGNOSIS — L918 Other hypertrophic disorders of the skin: Secondary | ICD-10-CM | POA: Diagnosis not present

## 2021-02-08 DIAGNOSIS — D1801 Hemangioma of skin and subcutaneous tissue: Secondary | ICD-10-CM | POA: Diagnosis not present

## 2021-02-10 ENCOUNTER — Other Ambulatory Visit: Payer: Self-pay | Admitting: Medical

## 2021-02-15 ENCOUNTER — Other Ambulatory Visit: Payer: Self-pay | Admitting: Medical

## 2021-02-15 NOTE — Telephone Encounter (Signed)
[name] is requesting ton fill pt valtrex. Please advise [facility]

## 2021-02-23 DIAGNOSIS — M65332 Trigger finger, left middle finger: Secondary | ICD-10-CM | POA: Diagnosis not present

## 2021-03-01 ENCOUNTER — Other Ambulatory Visit: Payer: Self-pay | Admitting: Medical

## 2021-03-01 DIAGNOSIS — E118 Type 2 diabetes mellitus with unspecified complications: Secondary | ICD-10-CM

## 2021-03-03 ENCOUNTER — Other Ambulatory Visit: Payer: Self-pay | Admitting: Internal Medicine

## 2021-03-06 ENCOUNTER — Other Ambulatory Visit: Payer: Medicare Other

## 2021-03-07 ENCOUNTER — Other Ambulatory Visit: Payer: Self-pay

## 2021-03-07 ENCOUNTER — Other Ambulatory Visit (INDEPENDENT_AMBULATORY_CARE_PROVIDER_SITE_OTHER): Payer: Medicare Other

## 2021-03-07 VITALS — BP 110/70

## 2021-03-07 DIAGNOSIS — E538 Deficiency of other specified B group vitamins: Secondary | ICD-10-CM | POA: Diagnosis not present

## 2021-03-07 DIAGNOSIS — M65332 Trigger finger, left middle finger: Secondary | ICD-10-CM

## 2021-03-07 MED ORDER — CYANOCOBALAMIN 1000 MCG/ML IJ SOLN
1000.0000 ug | Freq: Once | INTRAMUSCULAR | Status: AC
  Administered 2021-03-07: 1000 ug via INTRAMUSCULAR

## 2021-03-20 ENCOUNTER — Other Ambulatory Visit: Payer: Self-pay | Admitting: Cardiology

## 2021-03-20 NOTE — Telephone Encounter (Signed)
Prescription refill request for Eliquis received. Indication: Afib Last office visit: 12/13/20 Radford Pax)  Scr: 0.99 (12/13/20) Age: 70 Weight: 122.1kg  Appropriate dose and refill sent to requested pharmacy.

## 2021-04-24 ENCOUNTER — Other Ambulatory Visit: Payer: Self-pay | Admitting: Cardiology

## 2021-04-24 NOTE — Telephone Encounter (Signed)
Prescription refill request for Eliquis received. Indication:Afib  Last office visit: 12/13/20 Radford Pax)  Scr: 0.99 (12/13/20) Age: 70 Weight: 122.1kg  Appropriate dose and refill sent to requested pharmacy.

## 2021-05-03 ENCOUNTER — Ambulatory Visit (INDEPENDENT_AMBULATORY_CARE_PROVIDER_SITE_OTHER): Payer: Medicare Other | Admitting: Medical

## 2021-05-03 ENCOUNTER — Other Ambulatory Visit: Payer: Self-pay

## 2021-05-03 VITALS — BP 120/72 | HR 77 | Wt 254.4 lb

## 2021-05-03 DIAGNOSIS — E1169 Type 2 diabetes mellitus with other specified complication: Secondary | ICD-10-CM | POA: Diagnosis not present

## 2021-05-03 DIAGNOSIS — E785 Hyperlipidemia, unspecified: Secondary | ICD-10-CM

## 2021-05-03 DIAGNOSIS — E291 Testicular hypofunction: Secondary | ICD-10-CM

## 2021-05-03 DIAGNOSIS — E118 Type 2 diabetes mellitus with unspecified complications: Secondary | ICD-10-CM

## 2021-05-03 DIAGNOSIS — I2510 Atherosclerotic heart disease of native coronary artery without angina pectoris: Secondary | ICD-10-CM | POA: Diagnosis not present

## 2021-05-03 DIAGNOSIS — E538 Deficiency of other specified B group vitamins: Secondary | ICD-10-CM

## 2021-05-03 LAB — POCT GLYCOSYLATED HEMOGLOBIN (HGB A1C): Hemoglobin A1C: 6.3 % — AB (ref 4.0–5.6)

## 2021-05-03 NOTE — Progress Notes (Signed)
Subjective:  [name] is a 70 y.o. male who presents for Chief Complaint  Patient presents with   diabetes    Diabetes and recheck b12. Nonfasting. Declines flu shot      Here for med check.  In recent months he has worked to lose weight.  He is more active as he is working as a Education officer, community for [employer].  He is also compliant with Ozempic 2 mg weekly and Tresiba 18 units nightly for diabetes.  Home sugar readings have been in the 110s fasting.  Overall has been trying to lose weight.  Healthy diet and activity  In recent months we started B12 injections due to B12 deficiency.  His wife is giving him B12 injections at home.  We initially started every 2 weeks.  We had recommended going to monthly after the initial first month and a half , but he actually is continued every 2 weeks injections.   No other new c/o.  Testosterone therapy through urology.  Dyslipidemia-compliant with medications without compliant.  No other aggravating or relieving factors.    No other c/o.  Past Medical History:  Diagnosis Date   Atrial flutter ([place])    a. atypical atrial flutter.   Bell's palsy    CAD (coronary artery disease)    a. 07/2009 Cath: LM nl, LAD nl, LCX nl w/ L->R collats, chronically occluded RCA s/p failed PCI;  b. 07/2012 MV: basal inf mild ischemia.   Central retinal artery occlusion, left eye    diagnosed in 2008   Cerebrovascular accident [facility])    a. 03/2008 secondary to cardioembolic event;  b. chronic coumadin.   Chronic diastolic CHF (congestive heart failure) ([place])    a. 12/2008 Echo: nl EF.   Diabetes mellitus 11/14/2010   TYPE II   Hematuria 05/2013   Urology, Dr. Estill Dooms   Hyperlipidemia    Hypertension    Hypogonadism, male 05/2013   Dr. Estill Dooms, Urology   Morbid obesity [facility])    Obesity    Obstructive sleep apnea    Persistent atrial fibrillation ([place])    a. [facility]   Recommendation refused by patient    multiple recommended  vaccines refused over the years (flu/pneumovax)   Skin infection    chronic left leg herpetic   Current Outpatient Medications on File Prior to Visit  Medication Sig Dispense Refill   Accu-Chek FastClix Lancets MISC Test blood sugar bid. Dx code E11.9 100 each 11   acetaminophen (TYLENOL) 500 MG tablet Take 500 mg by mouth as needed for mild pain or headache.     Alirocumab (PRALUENT) 75 MG/ML SOAJ Inject 75 mg into the skin every 14 (fourteen) days. 6 mL 1   amLODipine (NORVASC) 5 MG tablet Take 1 tablet (5 mg total) by mouth daily. 90 tablet 1   atorvastatin (LIPITOR) 80 MG tablet Take 1 tablet (80 mg total) by mouth daily. 90 tablet 1   b complex vitamins capsule Take 1 capsule by mouth every other day.      Cholecalciferol (VITAMIN D3) 10000 UNITS capsule Take 1 capsule (10,000 Units total) by mouth daily. 4 capsule 0   clobetasol (TEMOVATE) 0.05 % external solution Apply 1 application topically daily as needed (rash).     cyanocobalamin (,VITAMIN B-12,) 1000 MCG/ML injection Inject into the muscle.     ELIQUIS 5 MG TABS tablet Take 1 tablet by mouth twice daily 60 tablet 0   glucose blood (ACCU-CHEK AVIVA PLUS) test  strip USE 1 STRIP TO CHECK GLUCOSE 1 TO 2 TIMES DAILY 100 each 0   insulin degludec (TRESIBA FLEXTOUCH) 100 UNIT/ML FlexTouch Pen Inject 18 Units into the skin at bedtime. 15 mL 5   Insulin Pen Needle (BD PEN NEEDLE NANO 2ND GEN) 32G X 4 MM MISC USE 1 PEN NEEDLE AT BEDTIME 100 each 5   isosorbide mononitrate (IMDUR) 30 MG 24 hr tablet Take 1 tablet (30 mg total) by mouth daily. 90 tablet 1   losartan (COZAAR) 100 MG tablet Take 1 tablet (100 mg total) by mouth daily. Call and schedule follow up office visit. 90 tablet 1   metoprolol tartrate (LOPRESSOR) 50 MG tablet Take 1 tablet by mouth twice daily (May take 1 extra tablet if heart rate is 110 or higher). 75 tablet 6   omega-3 acid ethyl esters (LOVAZA) 1 g capsule Take 2 capsules (2 g total) by mouth 2 (two) times daily. 360  capsule 1   Semaglutide, 2 MG/DOSE, (OZEMPIC, 2 MG/DOSE,) 8 MG/3ML SOPN Inject 2 mg into the skin once a week. 3 mL 5   testosterone (ANDROGEL) 50 MG/5GM (1%) GEL APPLY 2 PACKETS TOPICALLY ONTO THE SKIN ONCE DAILY     valACYclovir (VALTREX) 500 MG tablet Take 1 tablet by mouth once daily 90 tablet 0   vitamin B-12 (CYANOCOBALAMIN) 1000 MCG tablet Take 1 tablet (1,000 mcg total) by mouth daily. 90 tablet 0   No current facility-administered medications on file prior to visit.     The following portions of the patient's history were reviewed and updated as appropriate: allergies, current medications, past family history, past medical history, past social history, past surgical history and problem list.  ROS Otherwise as in subjective above  Objective: BP 120/72   Pulse 77   Wt 254 lb 6.4 oz (115.4 kg)   BMI 36.50 kg/m   General appearance: alert, no distress, well developed, well nourished Neck: supple, no lymphadenopathy, no thyromegaly, no masses Heart: RRR, normal S1, S2, no murmurs Lungs: CTA bilaterally, no wheezes, rhonchi, or rales Ext: no edema  Diabetic Foot Exam - Simple   Simple Foot Form Diabetic Foot exam was performed with the following findings: Yes 05/03/2021  2:45 PM  Visual Inspection See comments: Yes Sensation Testing Intact to touch and monofilament testing bilaterally: Yes Pulse Check See comments: Yes Comments Yellowish toenails throughout, pulses 1+, otherwise normal       Assessment: Encounter Diagnoses  Name Primary?   B12 deficiency Yes   Dyslipidemia associated with type 2 diabetes mellitus ([place])    Diabetes mellitus with complication [facility])    Hypogonadism male      Plan: Congratulated him on his recent weight loss.  Continue current medications for diabetes [place] and Ozempic.  Continue daily foot checks and routine follow-up.  Hemoglobin A1c 6.3% today improved.  Dyslipidemia-continue current medications.  I reviewed lipids that  are on file and at goal  Hypogonadism-managed through urology  B12 deficiency-we discussed that we will likely need him to go to monthly injection and not every 2 weeks.  Updated labs today    Iam was seen today for diabetes.  Diagnoses and all orders for this visit:  B12 deficiency -     Vitamin B12  Dyslipidemia associated with type 2 diabetes mellitus ([place])  Diabetes mellitus with complication ([place]) -     HgB A1c  Hypogonadism male   Follow up: pending labs

## 2021-05-04 ENCOUNTER — Encounter (INDEPENDENT_AMBULATORY_CARE_PROVIDER_SITE_OTHER): Payer: Medicare Other | Admitting: Ophthalmology

## 2021-05-04 DIAGNOSIS — H34812 Central retinal vein occlusion, left eye, with macular edema: Secondary | ICD-10-CM | POA: Diagnosis not present

## 2021-05-04 DIAGNOSIS — E113291 Type 2 diabetes mellitus with mild nonproliferative diabetic retinopathy without macular edema, right eye: Secondary | ICD-10-CM

## 2021-05-04 DIAGNOSIS — H35372 Puckering of macula, left eye: Secondary | ICD-10-CM | POA: Diagnosis not present

## 2021-05-04 DIAGNOSIS — H353122 Nonexudative age-related macular degeneration, left eye, intermediate dry stage: Secondary | ICD-10-CM | POA: Diagnosis not present

## 2021-05-04 DIAGNOSIS — H35033 Hypertensive retinopathy, bilateral: Secondary | ICD-10-CM

## 2021-05-04 DIAGNOSIS — I10 Essential (primary) hypertension: Secondary | ICD-10-CM | POA: Diagnosis not present

## 2021-05-04 DIAGNOSIS — H43813 Vitreous degeneration, bilateral: Secondary | ICD-10-CM

## 2021-05-04 LAB — VITAMIN B12: Vitamin B-12: 1301 pg/mL — ABNORMAL HIGH (ref 232–1245)

## 2021-05-08 ENCOUNTER — Other Ambulatory Visit: Payer: Self-pay | Admitting: Cardiology

## 2021-05-08 ENCOUNTER — Other Ambulatory Visit: Payer: Self-pay | Admitting: Medical

## 2021-05-08 NOTE — Telephone Encounter (Signed)
Eliquis 5 mg refill request received. Patient is 70 years old, weight- 115.4 kg, Crea- 0.99 on 12/13/20, Diagnosis-PAF, and last seen by Dr. Radford Pax on 12/13/20. Dose is appropriate based on dosing criteria. Will send in refill to requested pharmacy.

## 2021-05-31 ENCOUNTER — Other Ambulatory Visit: Payer: Self-pay | Admitting: Medical

## 2021-05-31 DIAGNOSIS — E118 Type 2 diabetes mellitus with unspecified complications: Secondary | ICD-10-CM

## 2021-06-19 ENCOUNTER — Encounter: Payer: Self-pay | Admitting: Cardiology

## 2021-06-19 ENCOUNTER — Other Ambulatory Visit: Payer: Self-pay

## 2021-06-19 ENCOUNTER — Ambulatory Visit (INDEPENDENT_AMBULATORY_CARE_PROVIDER_SITE_OTHER): Payer: Medicare Other | Admitting: Cardiology

## 2021-06-19 VITALS — BP 110/68 | HR 76 | Ht 70.0 in | Wt 251.0 lb

## 2021-06-19 DIAGNOSIS — I2583 Coronary atherosclerosis due to lipid rich plaque: Secondary | ICD-10-CM | POA: Diagnosis not present

## 2021-06-19 DIAGNOSIS — I5032 Chronic diastolic (congestive) heart failure: Secondary | ICD-10-CM

## 2021-06-19 DIAGNOSIS — G4733 Obstructive sleep apnea (adult) (pediatric): Secondary | ICD-10-CM | POA: Diagnosis not present

## 2021-06-19 DIAGNOSIS — I2510 Atherosclerotic heart disease of native coronary artery without angina pectoris: Secondary | ICD-10-CM | POA: Diagnosis not present

## 2021-06-19 DIAGNOSIS — I10 Essential (primary) hypertension: Secondary | ICD-10-CM

## 2021-06-19 DIAGNOSIS — E782 Mixed hyperlipidemia: Secondary | ICD-10-CM | POA: Diagnosis not present

## 2021-06-19 DIAGNOSIS — I4819 Other persistent atrial fibrillation: Secondary | ICD-10-CM

## 2021-06-19 MED ORDER — APIXABAN 5 MG PO TABS
5.0000 mg | ORAL_TABLET | Freq: Two times a day (BID) | ORAL | 3 refills | Status: DC
Start: 2021-06-19 — End: 2022-10-01

## 2021-06-19 MED ORDER — ISOSORBIDE MONONITRATE ER 30 MG PO TB24: 30.0000 mg | ORAL_TABLET | Freq: Every day | ORAL | 3 refills | Status: DC

## 2021-06-19 MED ORDER — AMLODIPINE BESYLATE 5 MG PO TABS: 5.0000 mg | ORAL_TABLET | Freq: Every day | ORAL | 1 refills | Status: DC

## 2021-06-19 MED ORDER — LOSARTAN POTASSIUM 100 MG PO TABS: 100.0000 mg | ORAL_TABLET | Freq: Every day | ORAL | 1 refills | Status: DC

## 2021-06-19 MED ORDER — PRALUENT 75 MG/ML ~~LOC~~ SOAJ: 75.0000 mg | SUBCUTANEOUS | 1 refills | Status: DC

## 2021-06-19 MED ORDER — ATORVASTATIN CALCIUM 80 MG PO TABS: 80.0000 mg | ORAL_TABLET | Freq: Every day | ORAL | 3 refills | Status: DC

## 2021-06-19 NOTE — Progress Notes (Signed)
Date:  06/19/2021   ID:  [name], DOB October 08, 1950, MRN [id number]  PCP:  Carlena Hurl, PA-C  Cardiologist:  Fransico Him, MD  Electrophysiologist:  None   Chief Complaint:  CAD, HTN, PAF, OSA  History of Present Illness:    [name] is a 70 y.o. male with a hx of ASCAD with chronically occluded RCA s/p failed PCI with L>>R collaterals, HTN, OSA on CPAP, morbid obesity, persistent AF s/p afib ablation, atrial flutter unable to ablate at EP study and dyslipidemia. He has had problems with recurrent afib and is now followed by afib clinic.  He was seen in afib clinic 09/07/2019 due to having recurrent afib after electric went out and he could not use his PAP device.  At that [place] he was in atrial flutter at 150bpm.  His metoprolol was increased and he was back in NSR by EKG 09/11/2019.   He was seen in ER on 11/30/2020 with dizziness and pre syncope and was found to be in afib with RVR and had successuful DCCV to NSR.  He had started a new job and though that that may have triggered it.  He uses his PAP nightly and never misses it. He was seen back in afib clinic and no med changes were made.   He is here today for followup and is doing well.  He denies any chest pain or pressure, SOB, DOE, PND, orthopnea, LE edema, dizziness, palpitations or syncope. He is compliant with his meds and is tolerating meds with no SE.     He is doing well with his CPAP device and thinks that he has gotten used to it.  He tolerates the mask and feels the pressure is adequate.  Since going on CPAP he feels rested in the am and has no significant daytime sleepiness.  He denies any significant mouth or nasal dryness or nasal congestion.  He does not think that he snores.     Prior CV studies:   The following studies were reviewed today:  PAP compliance download from [facility], outside labs from PCP  Past Medical History:  Diagnosis Date   Atrial flutter ([place])    a. atypical atrial flutter.   Bell's  palsy    CAD (coronary artery disease)    a. 07/2009 Cath: LM nl, LAD nl, LCX nl w/ L->R collats, chronically occluded RCA s/p failed PCI;  b. 07/2012 MV: basal inf mild ischemia.   Central retinal artery occlusion, left eye    diagnosed in 2008   Cerebrovascular accident [facility])    a. 03/2008 secondary to cardioembolic event;  b. chronic coumadin.   Chronic diastolic CHF (congestive heart failure) ([place])    a. 12/2008 Echo: nl EF.   Diabetes mellitus 11/14/2010   TYPE II   Hematuria 05/2013   Urology, Dr. Estill Dooms   Hyperlipidemia    Hypertension    Hypogonadism, male 05/2013   Dr. Estill Dooms, Urology   Morbid obesity [facility])    Obesity    Obstructive sleep apnea    Persistent atrial fibrillation ([place])    a. s/p afib ablation by [facility] 08/01/08   Recommendation refused by patient    multiple recommended vaccines refused over the years (flu/pneumovax)   Skin infection    chronic left leg herpetic   Past Surgical History:  Procedure Laterality Date   ATRIAL ABLATION SURGERY  08/01/08   CTI and PVI ablation by [facility]   COLONOSCOPY     2012 per patient  ELECTROPHYSIOLOGIC STUDY N/A 11/30/2014   Procedure: Atrial Fibrillation Ablation;  Surgeon: [name], MD;  Location: [facility];  Service: Cardiovascular;  Laterality: N/A;   TEE WITHOUT CARDIOVERSION N/A 11/30/2014   Procedure: TRANSESOPHAGEAL ECHOCARDIOGRAM (TEE);  Surgeon: Josue Hector, MD;  Location: [facility] ENDOSCOPY;  Service: Cardiovascular;  Laterality: N/A;     Current Meds  Medication Sig   ACCU-CHEK AVIVA PLUS test strip USE 1 STRIP TO CHECK GLUCOSE 1 TO 2 TIMES DAILY   Accu-Chek FastClix Lancets MISC Test blood sugar bid. Dx code E11.9   acetaminophen (TYLENOL) 500 MG tablet Take 500 mg by mouth as needed for mild pain or headache.   Alirocumab (PRALUENT) 75 MG/ML SOAJ Inject 75 mg into the skin every 14 (fourteen) days.   amLODipine (NORVASC) 5 MG tablet Take 1 tablet (5 mg total) by mouth daily.   apixaban (ELIQUIS) 5 MG TABS  tablet Take 1 tablet by mouth twice daily   atorvastatin (LIPITOR) 80 MG tablet Take 1 tablet (80 mg total) by mouth daily.   b complex vitamins capsule Take 1 capsule by mouth every other day.    Cholecalciferol (VITAMIN D3) 10000 UNITS capsule Take 1 capsule (10,000 Units total) by mouth daily.   clobetasol (TEMOVATE) 0.05 % external solution Apply 1 application topically daily as needed (rash).   cyanocobalamin (,VITAMIN B-12,) 1000 MCG/ML injection Inject into the muscle.   insulin degludec (TRESIBA FLEXTOUCH) 100 UNIT/ML FlexTouch Pen Inject 18 Units into the skin at bedtime.   Insulin Pen Needle (BD PEN NEEDLE NANO 2ND GEN) 32G X 4 MM MISC USE 1 PEN NEEDLE AT BEDTIME   isosorbide mononitrate (IMDUR) 30 MG 24 hr tablet Take 1 tablet (30 mg total) by mouth daily.   losartan (COZAAR) 100 MG tablet Take 1 tablet (100 mg total) by mouth daily. Call and schedule follow up office visit.   metoprolol tartrate (LOPRESSOR) 50 MG tablet Take 1 tablet by mouth twice daily (May take 1 extra tablet if heart rate is 110 or higher).   omega-3 acid ethyl esters (LOVAZA) 1 g capsule Take 2 capsules (2 g total) by mouth 2 (two) times daily.   Semaglutide, 2 MG/DOSE, (OZEMPIC, 2 MG/DOSE,) 8 MG/3ML SOPN Inject 2 mg into the skin once a week.   testosterone (ANDROGEL) 50 MG/5GM (1%) GEL APPLY 2 PACKETS TOPICALLY ONTO THE SKIN ONCE DAILY   valACYclovir (VALTREX) 500 MG tablet Take 1 tablet by mouth once daily   vitamin B-12 (CYANOCOBALAMIN) 1000 MCG tablet Take 1 tablet (1,000 mcg total) by mouth daily.     Allergies:   Crestor [rosuvastatin], Zetia [ezetimibe], Lisinopril, Farxiga [dapagliflozin], and Metformin and related   Social History   Tobacco Use   Smoking status: Former    Types: Cigarettes    Quit date: 07/24/1979    Years since quitting: 41.9   Smokeless tobacco: Never  Vaping Use   Vaping Use: Never used  Substance Use Topics   Alcohol use: No    Comment: former   Drug use: No    Comment:  former     Family Hx: The patient's family history includes Heart disease in his father and mother; Stroke in his father.  ROS:   Please see the history of present illness.     All other systems reviewed and are negative.   Labs/Other Tests and Data Reviewed:    Recent Labs: 11/30/2020: Magnesium 2.1 12/13/2020: ALT 20; BUN 15; Creatinine, Ser 0.99; Hemoglobin 14.8; Platelets 244; Potassium 4.6;  Sodium 139   Recent Lipid Panel Lab Results  Component Value Date/Time   CHOL 92 (L) 12/13/2020 08:41 AM   TRIG 129 12/13/2020 08:41 AM   HDL 44 12/13/2020 08:41 AM   CHOLHDL 2.1 12/13/2020 08:41 AM   CHOLHDL 3.3 07/30/2017 10:50 AM   LDLCALC 25 12/13/2020 08:41 AM   LDLCALC 65 07/30/2017 10:50 AM   LDLDIRECT 87.7 05/08/2013 08:37 AM    Wt Readings from Last 3 Encounters:  06/19/21 251 lb (113.9 kg)  05/03/21 254 lb 6.4 oz (115.4 kg)  01/26/21 269 lb 3.2 oz (122.1 kg)     Objective:    Vital Signs:  BP 110/68   Pulse 76   Ht 5\' 10"  (1.778 m)   Wt 251 lb (113.9 kg)   SpO2 96%   BMI 36.01 kg/m   GEN: Well nourished, well developed in no acute distress HEENT: Normal NECK: No JVD; No carotid bruits LYMPHATICS: No lymphadenopathy CARDIAC:RRR, no murmurs, rubs, gallops RESPIRATORY:  Clear to auscultation without rales, wheezing or rhonchi  ABDOMEN: Soft, non-tender, non-distended MUSCULOSKELETAL:  No edema; No deformity  SKIN: Warm and dry NEUROLOGIC:  Alert and oriented x 3 PSYCHIATRIC:  Normal affect    ASSESSMENT & PLAN:    1.  Persistent atrial fibrillation -He is maintaining normal sinus rhythm on exam and has had no palpitations or breakthrough PAF -He denies any bleeding issues on Eliquis -Continue prescription drug management with Lopressor 50 mg twice daily and Eliquis 5 mg twice daily with as needed refills -I have personally reviewed and interpreted outside labs performed by patient's PCP which showed serum creatinine 0.9 potassium 4.6 and hemoglobin 14.8 on  12/13/2020  2.  Hypertension -BP is well controlled on exam today -He will continue on prescription drug management with amlodipine 5 mg daily, losartan 100 mg daily and Lopressor 50 mg twice daily with as needed refills  3.  ASCAD  -hx of-chronically occluded RCA s/p failed PCI with L>>R collaterals.   -He has not had any anginal symptoms since I saw him last -Continue prescription drug management with Imdur 30 mg daily, statin and Lopressor 50 mg twice daily -no ASA due to DOAC  4.  Chronic diastolic CHF -He appears euvolemic on exam and denies any shortness of breath or lower extremity edema since I saw him last  -he has not required any diuretics    5. OSA - The patient is tolerating PAP therapy well without any problems. The PAP download performed by his DME was personally reviewed and interpreted by me today and showed an AHI of 0.4/hr on 16 cm H2O with 100% compliance in using more than 4 hours nightly.  The patient has been using and benefiting from PAP use and will continue to benefit from therapy.    6.  Morbid Obesity  -He has lost over 30lbs since I saw him last and is trying to lose another 30lbs   7.  Hyperlipidemia  -hisI have personally reviewed and interpreted outside labs performed by patient's PCP which showed LDL 25, HDL 44, tags 129, ALT 20 on 12/13/2020 -Continue prescription drug management with atorvastatin 80 mg daily, Lovaza 2 g twice daily and Praluent with as needed refills  Medication Adjustments/Labs and Tests Ordered: Current medicines are reviewed at length with the patient today.  Concerns regarding medicines are outlined above.  Tests Ordered: No orders of the defined types were placed in this encounter.  Medication Changes: No orders of the defined types were placed in  this encounter.   Disposition:  Follow up in 6 month(s)  Signed, Fransico Him, MD  06/19/2021 1:29 PM    [facility]

## 2021-06-19 NOTE — Addendum Note (Signed)
Addended by: Antonieta Iba on: 06/19/2021 01:34 PM   Modules accepted: Orders

## 2021-06-19 NOTE — Patient Instructions (Signed)

## 2021-07-09 ENCOUNTER — Other Ambulatory Visit: Payer: Self-pay | Admitting: Medical

## 2021-07-10 ENCOUNTER — Telehealth: Payer: Self-pay | Admitting: Medical

## 2021-07-10 DIAGNOSIS — E118 Type 2 diabetes mellitus with unspecified complications: Secondary | ICD-10-CM

## 2021-07-10 MED ORDER — ACCU-CHEK SOFTCLIX LANCETS MISC: 1 refills | Status: DC

## 2021-07-10 MED ORDER — ACCU-CHEK AVIVA PLUS W/DEVICE KIT: PACK | 0 refills | Status: DC

## 2021-07-10 MED ORDER — ACCU-CHEK GUIDE VI STRP: ORAL_STRIP | 1 refills | Status: DC

## 2021-07-10 MED ORDER — ACCU-CHEK GUIDE ME W/DEVICE KIT: PACK | 0 refills | Status: DC

## 2021-07-10 MED ORDER — ACCU-CHEK FASTCLIX LANCETS MISC: 1 refills | Status: DC

## 2021-07-10 MED ORDER — ACCU-CHEK AVIVA PLUS VI STRP: ORAL_STRIP | 1 refills | Status: DC

## 2021-07-10 NOTE — Telephone Encounter (Signed)
[facility] called and states they need accu-chek guide meter since they no longer have accu-check aviva meter

## 2021-07-10 NOTE — Addendum Note (Signed)
Addended by: Minette Headland A on: 07/10/2021 03:43 PM   Modules accepted: Orders

## 2021-07-10 NOTE — Telephone Encounter (Signed)
done

## 2021-07-10 NOTE — Addendum Note (Signed)
Addended by: Minette Headland A on: 07/10/2021 03:32 PM   Modules accepted: Orders

## 2021-07-10 NOTE — Telephone Encounter (Signed)
Pt called and states that needs a new meter accu check aviva please send to the [place], [id number] [place]

## 2021-07-21 ENCOUNTER — Other Ambulatory Visit: Payer: Self-pay

## 2021-07-21 MED ORDER — ACCU-CHEK GUIDE VI STRP: ORAL_STRIP | 1 refills | Status: DC

## 2021-07-28 DIAGNOSIS — E291 Testicular hypofunction: Secondary | ICD-10-CM | POA: Diagnosis not present

## 2021-07-28 DIAGNOSIS — N400 Enlarged prostate without lower urinary tract symptoms: Secondary | ICD-10-CM | POA: Diagnosis not present

## 2021-07-28 DIAGNOSIS — N509 Disorder of male genital organs, unspecified: Secondary | ICD-10-CM | POA: Diagnosis not present

## 2021-07-28 DIAGNOSIS — Z960 Presence of urogenital implants: Secondary | ICD-10-CM | POA: Diagnosis not present

## 2021-07-31 ENCOUNTER — Other Ambulatory Visit: Payer: Self-pay | Admitting: Medical

## 2021-08-05 ENCOUNTER — Other Ambulatory Visit: Payer: Self-pay | Admitting: Medical

## 2021-08-09 DIAGNOSIS — L814 Other melanin hyperpigmentation: Secondary | ICD-10-CM | POA: Diagnosis not present

## 2021-08-09 DIAGNOSIS — L570 Actinic keratosis: Secondary | ICD-10-CM | POA: Diagnosis not present

## 2021-08-09 DIAGNOSIS — D225 Melanocytic nevi of trunk: Secondary | ICD-10-CM | POA: Diagnosis not present

## 2021-08-09 DIAGNOSIS — L280 Lichen simplex chronicus: Secondary | ICD-10-CM | POA: Diagnosis not present

## 2021-08-09 DIAGNOSIS — L821 Other seborrheic keratosis: Secondary | ICD-10-CM | POA: Diagnosis not present

## 2021-08-30 ENCOUNTER — Telehealth: Payer: Self-pay

## 2021-08-30 MED ORDER — "BD ECLIPSE SYRINGE 25G X 5/8"" 1 ML MISC": 1 refills | Status: DC

## 2021-08-30 NOTE — Telephone Encounter (Signed)
Sent in smalled needle

## 2021-08-30 NOTE — Telephone Encounter (Signed)
Pt. Called wanting to know if you could call in a script for some needles for his B-12 injection they always give him super long needles at the pharmacy for his b-12 injection. He wanted to know if you could call in some needles that were smaller.

## 2021-09-06 ENCOUNTER — Other Ambulatory Visit: Payer: Self-pay | Admitting: Medical

## 2021-09-21 ENCOUNTER — Other Ambulatory Visit (HOSPITAL_COMMUNITY): Payer: Self-pay | Admitting: Nurse Practitioner

## 2021-09-21 ENCOUNTER — Telehealth: Payer: Self-pay

## 2021-09-21 DIAGNOSIS — I4819 Other persistent atrial fibrillation: Secondary | ICD-10-CM

## 2021-09-21 NOTE — Telephone Encounter (Signed)
Pt states the needles that he got for his B12 injections were much bigger this time, and he didn't know why and doesn't like them.  I called [facility] and they didn't have the 5/8 so they just gave him the 1 inch.  They don't carry the 5/8.  Pt informed and was pt was given some to hold him.  He will see what other pharmacy carries the 5/8 and let us know

## 2021-09-25 ENCOUNTER — Other Ambulatory Visit (HOSPITAL_COMMUNITY): Payer: Self-pay | Admitting: Nurse Practitioner

## 2021-09-25 DIAGNOSIS — I4819 Other persistent atrial fibrillation: Secondary | ICD-10-CM

## 2021-10-10 ENCOUNTER — Other Ambulatory Visit: Payer: Self-pay

## 2021-10-10 MED ORDER — PRALUENT 75 MG/ML ~~LOC~~ SOAJ: 75.0000 mg | SUBCUTANEOUS | 1 refills | Status: DC

## 2021-10-14 ENCOUNTER — Other Ambulatory Visit: Payer: Self-pay | Admitting: Cardiology

## 2021-10-16 MED ORDER — LOSARTAN POTASSIUM 100 MG PO TABS: 100.0000 mg | ORAL_TABLET | Freq: Every day | ORAL | 2 refills | Status: DC

## 2021-10-21 ENCOUNTER — Other Ambulatory Visit: Payer: Self-pay | Admitting: Medical

## 2021-10-24 ENCOUNTER — Telehealth: Payer: Self-pay | Admitting: Family Medicine

## 2021-10-24 ENCOUNTER — Encounter: Payer: Self-pay | Admitting: Family Medicine

## 2021-10-24 ENCOUNTER — Other Ambulatory Visit: Payer: Self-pay | Admitting: Medical

## 2021-10-24 MED ORDER — OZEMPIC (2 MG/DOSE) 8 MG/3ML ~~LOC~~ SOPN: 2.0000 mg | PEN_INJECTOR | SUBCUTANEOUS | 0 refills | Status: DC

## 2021-10-24 NOTE — Telephone Encounter (Signed)
[facility] is requesting qty3     90 day supply for Ozempic 2 mg  ?

## 2021-11-01 ENCOUNTER — Other Ambulatory Visit: Payer: Self-pay | Admitting: Medical

## 2021-11-30 ENCOUNTER — Other Ambulatory Visit: Payer: Self-pay | Admitting: Medical

## 2021-11-30 MED ORDER — TRESIBA FLEXTOUCH 100 UNIT/ML ~~LOC~~ SOPN: 18.0000 [IU] | PEN_INJECTOR | Freq: Every day | SUBCUTANEOUS | 2 refills | Status: DC

## 2022-01-11 ENCOUNTER — Other Ambulatory Visit: Payer: Self-pay | Admitting: Cardiology

## 2022-01-31 ENCOUNTER — Ambulatory Visit (INDEPENDENT_AMBULATORY_CARE_PROVIDER_SITE_OTHER): Payer: Medicare Other | Admitting: Medical

## 2022-01-31 ENCOUNTER — Encounter: Payer: Self-pay | Admitting: Medical

## 2022-01-31 ENCOUNTER — Other Ambulatory Visit: Payer: Self-pay

## 2022-01-31 ENCOUNTER — Telehealth: Payer: Self-pay

## 2022-01-31 VITALS — BP 120/70 | HR 77 | Wt 256.4 lb

## 2022-01-31 DIAGNOSIS — R0989 Other specified symptoms and signs involving the circulatory and respiratory systems: Secondary | ICD-10-CM

## 2022-01-31 DIAGNOSIS — E538 Deficiency of other specified B group vitamins: Secondary | ICD-10-CM

## 2022-01-31 DIAGNOSIS — I2510 Atherosclerotic heart disease of native coronary artery without angina pectoris: Secondary | ICD-10-CM

## 2022-01-31 DIAGNOSIS — I483 Typical atrial flutter: Secondary | ICD-10-CM | POA: Diagnosis not present

## 2022-01-31 DIAGNOSIS — E559 Vitamin D deficiency, unspecified: Secondary | ICD-10-CM | POA: Diagnosis not present

## 2022-01-31 DIAGNOSIS — Z136 Encounter for screening for cardiovascular disorders: Secondary | ICD-10-CM

## 2022-01-31 DIAGNOSIS — I10 Essential (primary) hypertension: Secondary | ICD-10-CM | POA: Diagnosis not present

## 2022-01-31 DIAGNOSIS — E785 Hyperlipidemia, unspecified: Secondary | ICD-10-CM

## 2022-01-31 DIAGNOSIS — E782 Mixed hyperlipidemia: Secondary | ICD-10-CM | POA: Diagnosis not present

## 2022-01-31 DIAGNOSIS — I5032 Chronic diastolic (congestive) heart failure: Secondary | ICD-10-CM

## 2022-01-31 DIAGNOSIS — G4733 Obstructive sleep apnea (adult) (pediatric): Secondary | ICD-10-CM | POA: Diagnosis not present

## 2022-01-31 DIAGNOSIS — H9190 Unspecified hearing loss, unspecified ear: Secondary | ICD-10-CM

## 2022-01-31 DIAGNOSIS — Z8673 Personal history of transient ischemic attack (TIA), and cerebral infarction without residual deficits: Secondary | ICD-10-CM | POA: Insufficient documentation

## 2022-01-31 DIAGNOSIS — I2583 Coronary atherosclerosis due to lipid rich plaque: Secondary | ICD-10-CM

## 2022-01-31 DIAGNOSIS — E291 Testicular hypofunction: Secondary | ICD-10-CM

## 2022-01-31 DIAGNOSIS — R8290 Unspecified abnormal findings in urine: Secondary | ICD-10-CM | POA: Diagnosis not present

## 2022-01-31 DIAGNOSIS — Z0000 Encounter for general adult medical examination without abnormal findings: Secondary | ICD-10-CM | POA: Diagnosis not present

## 2022-01-31 DIAGNOSIS — Z7901 Long term (current) use of anticoagulants: Secondary | ICD-10-CM | POA: Diagnosis not present

## 2022-01-31 DIAGNOSIS — Z8679 Personal history of other diseases of the circulatory system: Secondary | ICD-10-CM | POA: Diagnosis not present

## 2022-01-31 DIAGNOSIS — Z2820 Immunization not carried out because of patient decision for unspecified reason: Secondary | ICD-10-CM | POA: Diagnosis not present

## 2022-01-31 DIAGNOSIS — E1169 Type 2 diabetes mellitus with other specified complication: Secondary | ICD-10-CM

## 2022-01-31 DIAGNOSIS — I4819 Other persistent atrial fibrillation: Secondary | ICD-10-CM | POA: Diagnosis not present

## 2022-01-31 DIAGNOSIS — E118 Type 2 diabetes mellitus with unspecified complications: Secondary | ICD-10-CM | POA: Diagnosis not present

## 2022-01-31 LAB — URINALYSIS, ROUTINE W REFLEX MICROSCOPIC
Bilirubin, UA: NEGATIVE
Glucose, UA: NEGATIVE
Leukocytes,UA: NEGATIVE
Nitrite, UA: NEGATIVE
Protein,UA: NEGATIVE
RBC, UA: NEGATIVE
Specific Gravity, UA: 1.029 (ref 1.005–1.030)
Urobilinogen, Ur: 0.2 mg/dL (ref 0.2–1.0)
pH, UA: 5.5 (ref 5.0–7.5)

## 2022-01-31 LAB — POCT URINALYSIS DIP (PROADVANTAGE DEVICE)
Glucose, UA: NEGATIVE mg/dL
Ketones, POC UA: NEGATIVE mg/dL
Leukocytes, UA: NEGATIVE
Nitrite, UA: NEGATIVE
Protein Ur, POC: NEGATIVE mg/dL
Specific Gravity, Urine: 1.03
Urobilinogen, Ur: 0.2
pH, UA: 6 (ref 5.0–8.0)

## 2022-01-31 NOTE — Progress Notes (Signed)
Subjective:    [name] is a 71 y.o. male who presents for Preventative Services visit and chronic medical problems/med check visit.    Primary Care Provider Sonnie Bias, Camelia Eng, PA-C here for primary care  Current Health Care Team: Dr. Denyce Robert, [facility] Dr. Fransico Him, cardiology [facility] Dermatology Dr. Milly Jakob, orthopedics Dr. Zigmund Daniel, ophthalmology Dentist   Medical Services you may have received from other than [facility] providers in the past year (date may be approximate) Urology  Exercise Current exercise habits: walking  Nutrition/Diet Current diet: varied  Depression Screen    01/31/2022    8:56 AM  Depression screen PHQ 2/9  Decreased Interest 0  Down, Depressed, Hopeless 0  PHQ - 2 Score 0    Activities of Daily Living Screen/Functional Status Survey Is the patient deaf or have difficulty hearing?: No Does the patient have difficulty seeing, even when wearing glasses/contacts?: Yes Does the patient have difficulty concentrating, remembering, or making decisions?: Yes Does the patient have difficulty walking or climbing stairs?: No Does the patient have difficulty dressing or bathing?: No Does the patient have difficulty doing errands alone such as visiting a doctor's office or shopping?: No  Can patient draw a clock face showing 3:15 oclock, yes  Fall Risk Screen    01/31/2022    8:55 AM 05/03/2021    2:06 PM 01/26/2021    9:32 AM 01/11/2021   11:18 AM 12/11/2018    9:57 AM  [place] in the past year? 0 0 0 0 0  Number falls in past yr: 0 0 0 0   Injury with Fall? 0 0 0 0   Risk for fall due to : No Fall Risks No Fall Risks No Fall Risks No Fall Risks   Follow up Falls evaluation completed Falls evaluation completed Falls evaluation completed Falls evaluation completed Falls evaluation completed    Gait Assessment: Normal gait observed yes  Advanced directives Does patient have a [place]?  no Does patient have a Living Will? no  Past Medical History:  Diagnosis Date   Atrial flutter ([place])    a. atypical atrial flutter.   Bell's palsy    CAD (coronary artery disease)    a. 07/2009 Cath: LM nl, LAD nl, LCX nl w/ L->R collats, chronically occluded RCA s/p failed PCI;  b. 07/2012 MV: basal inf mild ischemia.   Central retinal artery occlusion, left eye    diagnosed in 2008   Cerebrovascular accident [facility])    a. 03/2008 secondary to cardioembolic event;  b. chronic coumadin.   Chronic diastolic CHF (congestive heart failure) ([place])    a. 12/2008 Echo: nl EF.   Diabetes mellitus 11/14/2010   TYPE II   Hematuria 05/2013   Urology, Dr. Estill Dooms   Hyperlipidemia    Hypertension    Hypogonadism, male 05/2013   Dr. Estill Dooms, Urology   Morbid obesity [facility])    Obesity    Obstructive sleep apnea    Persistent atrial fibrillation ([place])    a. s/p afib ablation by [facility] 08/01/08   Recommendation refused by patient    multiple recommended vaccines refused over the years (flu/pneumovax)   Skin infection    chronic left leg herpetic    Past Surgical History:  Procedure Laterality Date   ATRIAL ABLATION SURGERY  08/01/08   CTI and PVI ablation by [facility]   COLONOSCOPY     2012 per patient   ELECTROPHYSIOLOGIC STUDY N/A 11/30/2014  Procedure: Atrial Fibrillation Ablation;  Surgeon: [name], MD;  Location: [facility];  Service: Cardiovascular;  Laterality: N/A;   TEE WITHOUT CARDIOVERSION N/A 11/30/2014   Procedure: TRANSESOPHAGEAL ECHOCARDIOGRAM (TEE);  Surgeon: Josue Hector, MD;  Location: [facility] ENDOSCOPY;  Service: Cardiovascular;  Laterality: N/A;    Social History   Socioeconomic History   Marital status: Married    Spouse name: Not on file   Number of children: Not on file   Years of education: Not on file   Highest education level: Not on file  Occupational History   Not on file  Tobacco Use   Smoking status: Former    Types: Cigarettes    Quit date: 07/24/1979     Years since quitting: 42.5   Smokeless tobacco: Never  Vaping Use   Vaping Use: Never used  Substance and Sexual Activity   Alcohol use: No    Comment: former   Drug use: No    Comment: former   Sexual activity: Not on file  Other Topics Concern   Not on file  Social History Narrative   Lives in [place], [place]. With his spouse and son. Has remote history of tobacco, but quit 35 years ago. Has heavy history of alcohol consumption but quit 30 years ago. Previously used marijuana and cocaine, but denies use over the past 30 years. 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   Social Determinants of Health   Financial Resource Strain: Not on file  Food Insecurity: Not on file  Transportation Needs: Not on file  Physical Activity: Not on file  Stress: Not on file  Social Connections: Not on file  Intimate Partner Violence: Not on file    Family History  Problem Relation Age of Onset   Stroke Father    Heart disease Father    Heart disease Mother      Current Outpatient Medications:    Accu-Chek Softclix Lancets lancets, Test 1-2 times daily, Disp: 100 each, Rfl: 1   acetaminophen (TYLENOL) 500 MG tablet, Take 500 mg by mouth as needed for mild pain or headache., Disp: , Rfl:    Alirocumab (PRALUENT) 75 MG/ML SOAJ, Inject 75 mg into the skin every 14 (fourteen) days., Disp: 6 mL, Rfl: 1   amLODipine (NORVASC) 5 MG tablet, Take 1 tablet by mouth once daily, Disp: 90 tablet, Rfl: 0   apixaban (ELIQUIS) 5 MG TABS tablet, Take 1 tablet (5 mg total) by mouth 2 (two) times daily., Disp: 180 tablet, Rfl: 3   atorvastatin (LIPITOR) 80 MG tablet, Take 1 tablet (80 mg total) by mouth daily., Disp: 90 tablet, Rfl: 3   b complex vitamins capsule, Take 1 capsule by mouth every other day. , Disp: , Rfl:    Blood Glucose Monitoring Suppl (ACCU-CHEK GUIDE ME) w/Device KIT, Test 1-2 times daily, Disp: 1 kit, Rfl: 0   Cholecalciferol (VITAMIN D3) 10000 UNITS capsule, Take 1  capsule (10,000 Units total) by mouth daily., Disp: 4 capsule, Rfl: 0   clobetasol (TEMOVATE) 0.05 % external solution, Apply 1 application topically daily as needed (rash)., Disp: , Rfl:    cyanocobalamin (,VITAMIN B-12,) 1000 MCG/ML injection, INJECT 1 ML INTO THE MUSCLE ONCE FOR 1 DOSE EVERY 2 WEEKS, Disp: 2 mL, Rfl: 5   glucose blood (ACCU-CHEK GUIDE) test strip, USE 1 STRIP TO CHECK GLUCOSE ONCE TO TWICE DAILY, Disp: 100 each, Rfl: 0   insulin degludec (TRESIBA FLEXTOUCH) 100 UNIT/ML FlexTouch Pen, Inject 18 Units into  the skin at bedtime., Disp: 15 mL, Rfl: 2   Insulin Pen Needle (BD PEN NEEDLE NANO 2ND GEN) 32G X 4 MM MISC, USE 1 PEN NEEDLE AT BEDTIME, Disp: 100 each, Rfl: 5   isosorbide mononitrate (IMDUR) 30 MG 24 hr tablet, Take 1 tablet (30 mg total) by mouth daily., Disp: 90 tablet, Rfl: 3   losartan (COZAAR) 100 MG tablet, Take 1 tablet (100 mg total) by mouth daily., Disp: 90 tablet, Rfl: 2   metoprolol tartrate (LOPRESSOR) 50 MG tablet, TAKE 1 TABLET BY MOUTH TWICE DAILY MAY  TAKE  1  EXTRA  IF  HEART  RATE  IS  110  OR  HIGHER, Disp: 75 tablet, Rfl: 8   omega-3 acid ethyl esters (LOVAZA) 1 g capsule, Take 2 capsules (2 g total) by mouth 2 (two) times daily., Disp: 360 capsule, Rfl: 1   Semaglutide, 2 MG/DOSE, (OZEMPIC, 2 MG/DOSE,) 8 MG/3ML SOPN, Inject 2 mg into the skin once a week., Disp: 9 mL, Rfl: 0   SYRINGE/NEEDLE, DISP, 1 ML (BD ECLIPSE SYRINGE) 25G X 5/8" 1 ML MISC, Use as needed for b12 injections, Disp: 50 each, Rfl: 1   testosterone (ANDROGEL) 50 MG/5GM (1%) GEL, APPLY 2 PACKETS TOPICALLY ONTO THE SKIN ONCE DAILY, Disp: , Rfl:    valACYclovir (VALTREX) 500 MG tablet, Take 1 tablet by mouth once daily, Disp: 90 tablet, Rfl: 0   vitamin B-12 (CYANOCOBALAMIN) 1000 MCG tablet, Take 1 tablet (1,000 mcg total) by mouth daily., Disp: 90 tablet, Rfl: 0  Allergies  Allergen Reactions   Crestor [Rosuvastatin] Other (See Comments)    Made him feel crazy   Zetia [Ezetimibe] Other  (See Comments)    Made him feel crazy   Lisinopril Cough   Farxiga [Dapagliflozin]     Made heart race   Metformin And Related     Mental fog    History reviewed: allergies, current medications, past family history, past medical history, past social history, past surgical history and problem list  Chronic issues discussed: CAD, hyperlipidemia-compliant with Praluent injection, Lipitor 80 mg daily without complaint  Compliant with blood pressure medications.  No chest pain no palpitation no edema.  Diabetes-he has his list of blood sugars with him and his blood sugars and fasting are doing really well.  No hypoglycemia, no polyuria, no polydipsia.  Compliant with Tresiba 18 units daily, Ozempic 2 mg weekly.  He does check feet, no recent concerns  He has a cataract and given that one of his eyes is in poor condition his eye doctor does not want to pursue surgery at this time under good eye  On long-term anticoagulation with Eliquis.  Doing fine in this regard.  Acute issues discussed: none  Objective:   Biometrics BP 120/70   Pulse 77   Wt 256 lb 6.4 oz (116.3 kg)   SpO2 98%   BMI 36.79 kg/m   Wt Readings from Last 3 Encounters:  01/31/22 256 lb 6.4 oz (116.3 kg)  06/19/21 251 lb (113.9 kg)  05/03/21 254 lb 6.4 oz (115.4 kg)   BP Readings from Last 3 Encounters:  01/31/22 120/70  06/19/21 110/68  05/03/21 120/72   Gen: wd, wn, nad, white male HEENT: normocephalic, sclerae anicteric, TMs pearly, nares patent, no discharge or erythema, pharynx normal Oral cavity: MMM, no lesions Neck: supple, no lymphadenopathy, no thyromegaly, no masses, no bruits Heart: RRR, normal S1, S2, no murmurs Lungs: CTA bilaterally, no wheezes, rhonchi, or rales Abdomen: +bs, soft, central  abdominal bulge suggestive of diastases recti, there is a somewhat large reducible umbilical hernia nontender, non tender, non distended, no masses, no hepatomegaly, no splenomegaly, no  bruits Musculoskeletal: nontender, no swelling, no obvious deformity Extremities: no edema, no cyanosis, no clubbing Pulses: 2+ symmetric, upper and 1+ lower extremities, normal cap refill Neurological: alert, oriented x 3, CN2-12 intact, strength normal upper extremities and lower extremities, sensation normal throughout, DTRs 2+ throughout, no cerebellar signs, gait normal Psychiatric: normal affect, behavior normal, pleasant   Diabetic Foot Exam - Simple   Simple Foot Form Diabetic Foot exam was performed with the following findings: Yes 01/31/2022  9:34 AM  Visual Inspection See comments: Yes Sensation Testing Intact to touch and monofilament testing bilaterally: Yes Pulse Check See comments: Yes Comments 1+ bilateral pulses, yellowish toenails throughout but no thickening, no lesions      Assessment:   Encounter Diagnoses  Name Primary?   Diabetes mellitus with complication (HCC) Yes   Anticoagulant long-term use    Coronary artery disease due to lipid rich plaque    Chronic diastolic heart failure (HCC)    Dyslipidemia associated with type 2 diabetes mellitus ([place])    History of atrial fibrillation    Mixed dyslipidemia    OSA (obstructive sleep apnea)    Vitamin D deficiency    Vaccine refused by patient    Typical atrial flutter ([place])    Persistent atrial fibrillation ([place])    Morbid obesity ([place])    Hypogonadism male    B12 deficiency    Essential hypertension, benign    Hearing loss, unspecified hearing loss type, unspecified laterality    Encounter for screening for vascular disease    Decreased pedal pulses    History of stroke    Medicare annual wellness visit, subsequent      Plan:    This visit was a preventative care visit, also known as wellness visit or routine physical.   Topics typically include healthy lifestyle, diet, exercise, preventative care, vaccinations, sick and well care, proper use of emergency dept and after hours care, as well as  other concerns.     Recommendations: Continue to return yearly for your annual wellness and preventative care visits.  This gives [place] a chance to discuss healthy lifestyle, exercise, vaccinations, review your chart record, and perform screenings where appropriate.  I recommend you see your eye doctor yearly for routine vision care.  I recommend you see your dentist yearly for routine dental care including hygiene visits twice yearly.   Vaccination recommendations were reviewed For years she continues to decline vaccines in general   Screening for cancer: Colon cancer screening: I reviewed your colonoscopy on file that is up to date from 2018  Skin cancer screening: Continue routine surveillance with dermatology  Lung cancer screening: If you have a greater than 20 pack year history of tobacco use, then you may qualify for lung cancer screening with a chest CT scan.   Please call your insurance company to inquire about coverage for this test.  We currently don't have screenings for other cancers besides breast, cervical, colon, and lung cancers.  If you have a strong family history of cancer or have other cancer screening concerns, please let me know.    Bone health: Get at least 150 minutes of aerobic exercise weekly Get weight bearing exercise at least once weekly Bone density test:  A bone density test is an imaging test that uses a type of X-ray to measure the amount of  calcium and other minerals in your bones. The test may be used to diagnose or screen you for a condition that causes weak or thin bones (osteoporosis), predict your risk for a broken bone (fracture), or determine how well your osteoporosis treatment is working. The bone density test is recommended for females 29 and older, or females or males <02 if certain risk factors such as thyroid disease, long term use of steroids such as for asthma or rheumatological issues, vitamin D deficiency, estrogen deficiency, family  history of osteoporosis, self or family history of fragility fracture in first degree relative.    Heart health: Get at least 150 minutes of aerobic exercise weekly Limit alcohol It is important to maintain a healthy blood pressure and healthy cholesterol numbers  Heart disease screening: Screening for heart disease includes screening for blood pressure, fasting lipids, glucose/diabetes screening, BMI height to weight ratio, reviewed of smoking status, physical activity, and diet.    Goals include blood pressure 120/80 or less, maintaining a healthy lipid/cholesterol profile, preventing diabetes or keeping diabetes numbers under good control, not smoking or using tobacco products, exercising most days per week or at least 150 minutes per week of exercise, and eating healthy variety of fruits and vegetables, healthy oils, and avoiding unhealthy food choices like fried food, fast food, high sugar and high cholesterol foods.    Continue routine follow-up with cardiology  Medical care options: I recommend you continue to seek care here first for routine care.  We try really hard to have available appointments Monday through Friday daytime hours for sick visits, acute visits, and physicals.  Urgent care should be used for after hours and weekends for significant issues that cannot wait till the next day.  The emergency department should be used for significant potentially life-threatening emergencies.  The emergency department is expensive, can often have long wait times for less significant concerns, so try to utilize primary care, urgent care, or telemedicine when possible to avoid unnecessary trips to the emergency department.  Virtual visits and telemedicine have been introduced since the pandemic started in 2020, and can be convenient ways to receive medical care.  We offer virtual appointments as well to assist you in a variety of options to seek medical care.   Advanced Directives: I recommend  you consider completing a [place] and Living Will.   These documents respect your wishes and help alleviate burdens on your loved ones if you were to become terminally ill or be in a position to need those documents enforced.    You can complete Advanced Directives yourself, have them notarized, then have copies made for our office, for you and for anybody you feel should have them in safe keeping.  Or, you can have an attorney prepare these documents.   If you haven't updated your Last Will and Testament in a while, it may be worthwhile having an attorney prepare these documents together and save on some costs.      Separate significant issues discussed: He has not had ultrasound imaging in quite a while so we will set up for carotid ultrasounds and ABIs given his underlying risk factors, prior stroke, diabetes and overall screening  Updated labs today.  Continue current medications.  Continue routine follow-up with cardiology, eye doctor, urology.  Counseled on needs to lose weight.  We have had these discussions before, and he is on medicine that should be helping with weight loss but unfortunately I think his diet is not  going to change a whole lot  Nevertheless, continue efforts to eat healthy and lose weight and exercise.  Testosterone therapy managed by urology  I reviewed his recent cardiology notes from 05/2021.  At that time and today he has maintained normal sinus rhythm, no changes were made as of that cardiology visit.  Atherosclerosis, continues on aspirin, Imdur, statin, Praluent and Lopressor 50 mg twice daily  Sleep apnea-continue current therapy with PAP  Hyperlipidemia and CAD-continue atorvastatin 80 mg daily and Praluent injection  Diabetes-continue Ozempic and Tresiba, glucose monitoring, daily foot checks   Harith was seen today for annual exam.  Diagnoses and all orders for this visit:  Diabetes mellitus with complication ([place]) -      Comprehensive metabolic panel -     Hemoglobin A1c -     Microalbumin/Creatinine Ratio, Urine  Anticoagulant long-term use -     CBC  Coronary artery disease due to lipid rich plaque -     VAS [place] ABI WITH/WO TBI; Future -     US Carotid Duplex Bilateral; Future  Chronic diastolic heart failure (HCC)  Dyslipidemia associated with type 2 diabetes mellitus (HCC) -     Lipid panel  History of atrial fibrillation  Mixed dyslipidemia  OSA (obstructive sleep apnea)  Vitamin D deficiency  Vaccine refused by patient  Typical atrial flutter (HCC)  Persistent atrial fibrillation (HCC)  Morbid obesity ([place])  Hypogonadism male  B12 deficiency -     CBC  Essential hypertension, benign -     Comprehensive metabolic panel -     Microalbumin/Creatinine Ratio, Urine -     POCT Urinalysis DIP (Proadvantage Device) -     VAS [place] ABI WITH/WO TBI; Future -     US Carotid Duplex Bilateral; Future  Hearing loss, unspecified hearing loss type, unspecified laterality  Encounter for screening for vascular disease -     VAS [place] ABI WITH/WO TBI; Future -     US Carotid Duplex Bilateral; Future  Decreased pedal pulses  History of stroke  Medicare annual wellness visit, subsequent   Spent > 45 minutes face to face with patient in discussion of symptoms, evaluation, plan and recommendations.     Medicare Attestation A preventative services visit was completed today.  During the course of the visit the patient was educated and counseled about appropriate screening and preventive services.  A health risk assessment was established with the patient that included a review of current medications, allergies, social history, family history, medical and preventative health history, biometrics, and preventative screenings to identify potential safety concerns or impairments.  A personalized plan was printed today for the patient's records and use.   Personalized health advice and education was  given today to reduce health risks and promote self management and wellness.  Information regarding end of life planning was discussed today.  Dorothea Ogle, PA-C   01/31/2022

## 2022-01-31 NOTE — Telephone Encounter (Signed)
Dorothy from [facility] heart and vascular called and said they are unable to schedule pt because the carotid order was put in as imaging instead of a vascular order.

## 2022-01-31 NOTE — Addendum Note (Signed)
Addended by: Minette Headland A on: 01/31/2022 01:57 PM   Modules accepted: Orders

## 2022-01-31 NOTE — Telephone Encounter (Signed)
I have put the correct order in.

## 2022-02-01 ENCOUNTER — Telehealth: Payer: Self-pay | Admitting: Internal Medicine

## 2022-02-01 ENCOUNTER — Other Ambulatory Visit: Payer: Self-pay | Admitting: Medical

## 2022-02-01 LAB — COMPREHENSIVE METABOLIC PANEL
ALT: 13 IU/L (ref 0–44)
AST: 14 IU/L (ref 0–40)
Albumin/Globulin Ratio: 1.7 (ref 1.2–2.2)
Albumin: 4.5 g/dL (ref 3.8–4.8)
Alkaline Phosphatase: 94 IU/L (ref 44–121)
BUN/Creatinine Ratio: 14 (ref 10–24)
BUN: 13 mg/dL (ref 8–27)
Bilirubin Total: 0.4 mg/dL (ref 0.0–1.2)
CO2: 23 mmol/L (ref 20–29)
Calcium: 9.9 mg/dL (ref 8.6–10.2)
Chloride: 102 mmol/L (ref 96–106)
Creatinine, Ser: 0.9 mg/dL (ref 0.76–1.27)
Globulin, Total: 2.7 g/dL (ref 1.5–4.5)
Glucose: 108 mg/dL — ABNORMAL HIGH (ref 70–99)
Potassium: 5 mmol/L (ref 3.5–5.2)
Sodium: 141 mmol/L (ref 134–144)
Total Protein: 7.2 g/dL (ref 6.0–8.5)
eGFR: 91 mL/min/{1.73_m2} (ref >59)

## 2022-02-01 LAB — CBC
Hematocrit: 46.5 % (ref 37.5–51.0)
Hemoglobin: 15.2 g/dL (ref 13.0–17.7)
MCH: 29 pg (ref 26.6–33.0)
MCHC: 32.7 g/dL (ref 31.5–35.7)
MCV: 89 fL (ref 79–97)
Platelets: 226 10*3/uL (ref 150–450)
RBC: 5.25 x10E6/uL (ref 4.14–5.80)
RDW: 14.7 % (ref 11.6–15.4)
WBC: 6.9 10*3/uL (ref 3.4–10.8)

## 2022-02-01 LAB — LIPID PANEL
Chol/HDL Ratio: 2.4 ratio (ref 0.0–5.0)
Cholesterol, Total: 113 mg/dL (ref 100–199)
HDL: 48 mg/dL (ref >39)
LDL Chol Calc (NIH): 38 mg/dL (ref 0–99)
Triglycerides: 162 mg/dL — ABNORMAL HIGH (ref 0–149)
VLDL Cholesterol Cal: 27 mg/dL (ref 5–40)

## 2022-02-01 LAB — HEMOGLOBIN A1C
Est. average glucose Bld gHb Est-mCnc: 140 mg/dL
Hgb A1c MFr Bld: 6.5 % — ABNORMAL HIGH (ref 4.8–5.6)

## 2022-02-01 LAB — MICROALBUMIN / CREATININE URINE RATIO
Creatinine, Urine: 183.4 mg/dL
Microalb/Creat Ratio: 12 mg/g creat (ref 0–29)
Microalbumin, Urine: 21.6 ug/mL

## 2022-02-01 MED ORDER — PRALUENT 75 MG/ML ~~LOC~~ SOAJ
75.0000 mg | SUBCUTANEOUS | 4 refills | Status: DC
Start: 2022-02-01 — End: 2023-02-20

## 2022-02-01 MED ORDER — OZEMPIC (2 MG/DOSE) 8 MG/3ML ~~LOC~~ SOPN
2.0000 mg | PEN_INJECTOR | SUBCUTANEOUS | 3 refills | Status: DC
Start: 2022-02-01 — End: 2022-02-27

## 2022-02-01 MED ORDER — OMEGA-3-ACID ETHYL ESTERS 1 G PO CAPS
2.0000 | ORAL_CAPSULE | Freq: Two times a day (BID) | ORAL | 3 refills | Status: DC
Start: 2022-02-01 — End: 2023-02-20

## 2022-02-01 MED ORDER — "BD ECLIPSE SYRINGE 25G X 5/8"" 1 ML MISC": 5 refills | Status: AC

## 2022-02-01 MED ORDER — VITAMIN B-12 1000 MCG PO TABS: 1000.0000 ug | ORAL_TABLET | Freq: Every day | ORAL | 3 refills | Status: DC

## 2022-02-01 MED ORDER — TRESIBA FLEXTOUCH 100 UNIT/ML ~~LOC~~ SOPN
18.0000 [IU] | PEN_INJECTOR | Freq: Every day | SUBCUTANEOUS | 3 refills | Status: DC
Start: 2022-02-01 — End: 2023-02-28

## 2022-02-01 NOTE — Telephone Encounter (Signed)
P.A done on Omega -3- acid Ethyl Esters 1GM through covermymeds

## 2022-02-02 ENCOUNTER — Telehealth: Payer: Self-pay

## 2022-02-02 NOTE — Telephone Encounter (Signed)
Pt can not afford and is cancelling

## 2022-02-02 NOTE — Telephone Encounter (Signed)
Pt states he can not afford $125 so he will be cancelling

## 2022-02-02 NOTE — Telephone Encounter (Signed)
[name] from [facility] called the Carotid US you ordered. They said you order a screening US and they can not do this [place] as a screening US it would have to be a diagnostic US and would have to have a diagnosis to why he needed it. They do offer a VAS U SCREEN exam that checks the Carotid, aorta, and abi but pt. Has to pay out of pocket 125 dollars. We would have to check with the pt. First to see if he wanted to pay out of pocket for this.

## 2022-02-02 NOTE — Telephone Encounter (Signed)
Omega-3-acid ethyl esters capsule 1gm approved . I have faxed to pharmacy approval

## 2022-02-05 NOTE — Telephone Encounter (Signed)
Pt is out of town and doesn't want this done right now. He will call back in a couple weeks if he decides to do it

## 2022-02-08 ENCOUNTER — Other Ambulatory Visit: Payer: Self-pay | Admitting: Medical

## 2022-02-13 ENCOUNTER — Ambulatory Visit (HOSPITAL_COMMUNITY)
Admission: RE | Admit: 2022-02-13 | Payer: Medicare Other | Source: Ambulatory Visit | Attending: Medical | Admitting: Medical

## 2022-02-26 ENCOUNTER — Other Ambulatory Visit: Payer: Self-pay | Admitting: Medical

## 2022-02-26 ENCOUNTER — Other Ambulatory Visit: Payer: Self-pay | Admitting: Cardiology

## 2022-02-27 NOTE — Telephone Encounter (Signed)
[facility] is requesting to fill pt valtrex . Please advise [facility]

## 2022-02-28 ENCOUNTER — Other Ambulatory Visit (HOSPITAL_COMMUNITY): Payer: Self-pay

## 2022-02-28 ENCOUNTER — Telehealth: Payer: Self-pay | Admitting: Medical

## 2022-02-28 MED ORDER — OZEMPIC (2 MG/DOSE) 8 MG/3ML ~~LOC~~ SOPN
2.0000 mg | PEN_INJECTOR | SUBCUTANEOUS | 0 refills | Status: DC
  Filled 2022-02-28 – 2022-03-01 (×2): qty 3, 28d supply, fill #0

## 2022-02-28 NOTE — Telephone Encounter (Signed)
Pt called & his pharmacy is out of Ozempic & he has called around and couldn't find it, I had him call [facility] & they had it in stock.  I transferred [facility] there by phone.

## 2022-03-01 ENCOUNTER — Other Ambulatory Visit (HOSPITAL_COMMUNITY): Payer: Self-pay

## 2022-03-02 ENCOUNTER — Other Ambulatory Visit: Payer: Self-pay | Admitting: Medical

## 2022-03-02 NOTE — Telephone Encounter (Signed)
done

## 2022-03-23 ENCOUNTER — Other Ambulatory Visit: Payer: Self-pay | Admitting: Medical

## 2022-03-27 ENCOUNTER — Other Ambulatory Visit: Payer: Self-pay | Admitting: Medical

## 2022-03-28 ENCOUNTER — Encounter: Payer: Self-pay | Admitting: Internal Medicine

## 2022-04-19 DIAGNOSIS — H31012 Macula scars of posterior pole (postinflammatory) (post-traumatic), left eye: Secondary | ICD-10-CM | POA: Diagnosis not present

## 2022-04-19 DIAGNOSIS — H2513 Age-related nuclear cataract, bilateral: Secondary | ICD-10-CM | POA: Diagnosis not present

## 2022-04-19 DIAGNOSIS — H25013 Cortical age-related cataract, bilateral: Secondary | ICD-10-CM | POA: Diagnosis not present

## 2022-04-20 ENCOUNTER — Other Ambulatory Visit (HOSPITAL_BASED_OUTPATIENT_CLINIC_OR_DEPARTMENT_OTHER): Payer: Self-pay

## 2022-04-23 ENCOUNTER — Telehealth: Payer: Self-pay | Admitting: Licensed Clinical Social Worker

## 2022-04-23 NOTE — Patient Outreach (Signed)
  Care Coordination   Initial Visit Note   04/23/2022 Name: Tadeusz Stahl MRN: [id number] DOB: 05-02-1951  Pershing Skidmore is a 71 y.o. year old male who sees Tysinger, Camelia Eng, PA-C for primary care. I spoke with  Carolyne Fiscal by phone today.  What matters to the patients health and wellness today?  LCSW attempted to inform pt of care coordination services. Pt was at dinner and requested a call back    Goals Addressed   None     SDOH assessments and interventions completed:  No     Care Coordination Interventions Activated:  No  Care Coordination Interventions:  No, not indicated   Follow up plan:  LCSW will call back at a later date    Encounter Outcome:  Pt. Request to Call [place], [email or web address], [email or web address] Phone [phone] 4:08 PM

## 2022-05-04 ENCOUNTER — Encounter (INDEPENDENT_AMBULATORY_CARE_PROVIDER_SITE_OTHER): Payer: Medicare Other | Admitting: Ophthalmology

## 2022-05-04 DIAGNOSIS — H34812 Central retinal vein occlusion, left eye, with macular edema: Secondary | ICD-10-CM

## 2022-05-04 DIAGNOSIS — I10 Essential (primary) hypertension: Secondary | ICD-10-CM

## 2022-05-04 DIAGNOSIS — E113291 Type 2 diabetes mellitus with mild nonproliferative diabetic retinopathy without macular edema, right eye: Secondary | ICD-10-CM

## 2022-05-04 DIAGNOSIS — H35372 Puckering of macula, left eye: Secondary | ICD-10-CM

## 2022-05-04 DIAGNOSIS — H43813 Vitreous degeneration, bilateral: Secondary | ICD-10-CM

## 2022-05-04 DIAGNOSIS — H35033 Hypertensive retinopathy, bilateral: Secondary | ICD-10-CM

## 2022-05-09 ENCOUNTER — Other Ambulatory Visit (HOSPITAL_COMMUNITY): Payer: Self-pay | Admitting: Cardiology

## 2022-05-09 DIAGNOSIS — I4819 Other persistent atrial fibrillation: Secondary | ICD-10-CM

## 2022-05-28 ENCOUNTER — Other Ambulatory Visit: Payer: Self-pay | Admitting: Family Medicine

## 2022-05-28 ENCOUNTER — Other Ambulatory Visit: Payer: Self-pay | Admitting: Medical

## 2022-06-11 ENCOUNTER — Other Ambulatory Visit: Payer: Self-pay | Admitting: Medical

## 2022-06-17 ENCOUNTER — Other Ambulatory Visit: Payer: Self-pay | Admitting: Medical

## 2022-07-04 NOTE — Progress Notes (Unsigned)
Date:  07/05/2022   ID:  [name], DOB 1950/10/24, MRN [id number]  PCP:  Carlena Hurl, PA-C  Cardiologist:  Fransico Him, MD  Electrophysiologist:  None   Chief Complaint:  CAD, HTN, PAF, OSA  History of Present Illness:    [name] is a 71 y.o. male with a hx of ASCAD with chronically occluded RCA s/p failed PCI with L>>R collaterals, HTN, OSA on CPAP, morbid obesity, persistent AF s/p afib ablation, atrial flutter unable to ablate at EP study and dyslipidemia. He has had problems with recurrent afib and is now followed by afib clinic.  He was seen in afib clinic 09/07/2019 due to having recurrent afib after electric went out and he could not use his PAP device.  At that [place] he was in atrial flutter at 150bpm.  His metoprolol was increased and he was back in NSR by EKG 09/11/2019.   He was seen in ER on 11/30/2020 with dizziness and pre syncope and was found to be in afib with RVR and had successuful DCCV to NSR.  He had started a new job and though that that may have triggered it.  He uses his PAP nightly and never misses it. He was seen back in afib clinic and no med changes were made.   He is here today for followup and is doing well.  He denies any chest pain or pressure, SOB, DOE, PND, orthopnea, LE edema, dizziness, palpitations or syncope. He is compliant with his meds and is tolerating meds with no SE.    He is doing well with his PAP device and thinks that he has gotten used to it.  He tolerates the mask and feels the pressure is adequate.  Since going on PAP he feels rested in the am and has no significant daytime sleepiness.  He denies any significant mouth or nasal dryness or nasal congestion.  He does not think that he snores.   Prior CV studies:   The following studies were reviewed today:  PAP compliance download from [place], outside labs from PCP  Past Medical History:  Diagnosis Date   Atrial flutter ([place])    a. atypical atrial flutter.   Bell's palsy     CAD (coronary artery disease)    a. 07/2009 Cath: LM nl, LAD nl, LCX nl w/ L->R collats, chronically occluded RCA s/p failed PCI;  b. 07/2012 MV: basal inf mild ischemia.   Central retinal artery occlusion, left eye    diagnosed in 2008   Cerebrovascular accident [facility])    a. 03/2008 secondary to cardioembolic event;  b. chronic coumadin.   Chronic diastolic CHF (congestive heart failure) ([place])    a. 12/2008 Echo: nl EF.   Diabetes mellitus 11/14/2010   TYPE II   Hematuria 05/2013   Urology, Dr. Estill Dooms   Hyperlipidemia    Hypertension    Hypogonadism, male 05/2013   Dr. Estill Dooms, Urology   Morbid obesity [facility])    Obesity    Obstructive sleep apnea    Persistent atrial fibrillation ([place])    a. s/p afib ablation by [facility] 08/01/08   Recommendation refused by patient    multiple recommended vaccines refused over the years (flu/pneumovax)   Skin infection    chronic left leg herpetic   Past Surgical History:  Procedure Laterality Date   ATRIAL ABLATION SURGERY  08/01/08   CTI and PVI ablation by [facility]   COLONOSCOPY     2012 per patient   ELECTROPHYSIOLOGIC STUDY N/A  11/30/2014   Procedure: Atrial Fibrillation Ablation;  Surgeon: [name], MD;  Location: [place] CV [facility];  Service: Cardiovascular;  Laterality: N/A;   TEE WITHOUT CARDIOVERSION N/A 11/30/2014   Procedure: TRANSESOPHAGEAL ECHOCARDIOGRAM (TEE);  Surgeon: Josue Hector, MD;  Location: [facility] ENDOSCOPY;  Service: Cardiovascular;  Laterality: N/A;     Current Meds  Medication Sig   Accu-Chek Softclix Lancets lancets Test 1-2 times daily   acetaminophen (TYLENOL) 500 MG tablet Take 500 mg by mouth as needed for mild pain or headache.   Alirocumab (PRALUENT) 75 MG/ML SOAJ Inject 75 mg into the skin every 14 (fourteen) days.   amLODipine (NORVASC) 5 MG tablet Take 1 tablet by mouth once daily   apixaban (ELIQUIS) 5 MG TABS tablet Take 1 tablet (5 mg total) by mouth 2 (two) times daily.   atorvastatin (LIPITOR) 80 MG tablet Take 1 tablet  (80 mg total) by mouth daily.   b complex vitamins capsule Take 1 capsule by mouth every other day.    BD PEN NEEDLE NANO 2ND GEN 32G X 4 MM MISC USE 1 PEN NEEDLE AT BEDTIME   Blood Glucose Monitoring Suppl (ACCU-CHEK GUIDE ME) w/Device KIT Test 1-2 times daily   clobetasol (TEMOVATE) 0.05 % external solution Apply 1 application topically daily as needed (rash).   cyanocobalamin (VITAMIN B12) 1000 MCG/ML injection INJECT 1 ML INTRAMUSCULARLY  EVERY TWO WEEKS   glucose blood (ACCU-CHEK GUIDE) test strip USE 1 STRIP TO CHECK GLUCOSE ONCE TO TWICE DAILY   insulin degludec (TRESIBA FLEXTOUCH) 100 UNIT/ML FlexTouch Pen Inject 18 Units into the skin at bedtime.   isosorbide mononitrate (IMDUR) 30 MG 24 hr tablet Take 1 tablet (30 mg total) by mouth daily.   losartan (COZAAR) 100 MG tablet Take 1 tablet (100 mg total) by mouth daily.   metoprolol tartrate (LOPRESSOR) 50 MG tablet TAKE 1 TABLET BY MOUTH TWICE DAILY MAY  TAKE  1  EXTRA  IF  HEART  RATE  IS  110  OR  HIGHER.   omega-3 acid ethyl esters (LOVAZA) 1 g capsule Take 2 capsules (2 g total) by mouth 2 (two) times daily.   OZEMPIC, 2 MG/DOSE, 8 MG/3ML SOPN INJECT 2 MG SUBCUTANEOUSLY ONCE A WEEK   Semaglutide, 2 MG/DOSE, (OZEMPIC, 2 MG/DOSE,) 8 MG/3ML SOPN Inject 2 mg into the skin once a week.   SYRINGE/NEEDLE, DISP, 1 ML (BD ECLIPSE SYRINGE) 25G X 5/8" 1 ML MISC Use as needed for b12 injections   testosterone (ANDROGEL) 50 MG/5GM (1%) GEL APPLY 2 PACKETS TOPICALLY ONTO THE SKIN ONCE DAILY   valACYclovir (VALTREX) 500 MG tablet Take 1 tablet by mouth once daily   vitamin B-12 (CYANOCOBALAMIN) 1000 MCG tablet Take 1 tablet (1,000 mcg total) by mouth daily.     Allergies:   Crestor [rosuvastatin], Zetia [ezetimibe], Lisinopril, Farxiga [dapagliflozin], and Metformin and related   Social History   Tobacco Use   Smoking status: Former    Types: Cigarettes    Quit date: 07/24/1979    Years since quitting: 42.9   Smokeless tobacco: Never  Vaping  Use   Vaping Use: Never used  Substance Use Topics   Alcohol use: No    Comment: former   Drug use: No    Comment: former     Family Hx: The patient's family history includes Heart disease in his father and mother; Stroke in his father.  ROS:   Please see the history of present illness.     All other  systems reviewed and are negative.   Labs/Other Tests and Data Reviewed:    Recent Labs: 01/31/2022: ALT 13; BUN 13; Creatinine, Ser 0.90; Hemoglobin 15.2; Platelets 226; Potassium 5.0; Sodium 141   Recent Lipid Panel Lab Results  Component Value Date/Time   CHOL 113 01/31/2022 10:01 AM   TRIG 162 (H) 01/31/2022 10:01 AM   HDL 48 01/31/2022 10:01 AM   CHOLHDL 2.4 01/31/2022 10:01 AM   CHOLHDL 3.3 07/30/2017 10:50 AM   LDLCALC 38 01/31/2022 10:01 AM   LDLCALC 65 07/30/2017 10:50 AM   LDLDIRECT 87.7 05/08/2013 08:37 AM    Wt Readings from Last 3 Encounters:  07/05/22 261 lb (118.4 kg)  01/31/22 256 lb 6.4 oz (116.3 kg)  06/19/21 251 lb (113.9 kg)     Objective:    Vital Signs:  BP 114/60   Pulse 72   Ht _0  (1.778 m)   Wt 261 lb (118.4 kg)   SpO2 97%   BMI 37.45 kg/m   GEN: Well nourished, well developed in no acute distress HEENT: Normal NECK: No JVD; No carotid bruits LYMPHATICS: No lymphadenopathy CARDIAC:RRR, no murmurs, rubs, gallops RESPIRATORY:  Clear to auscultation without rales, wheezing or rhonchi  ABDOMEN: Soft, non-tender, non-distended MUSCULOSKELETAL:  No edema; No deformity  SKIN: Warm and dry NEUROLOGIC:  Alert and oriented x 3 PSYCHIATRIC:  Normal affect   EKG:  NSR with inferior and septal infarct age undetermined ASSESSMENT & PLAN:    1.  Persistent atrial fibrillation -he remains in NSR and denies any breakthrough palpitations -He denies any bleeding issues on Eliquis -continue prescription drug management with Lopressor 21m BID and Eliquis 51mBID with PRN refills -I have personally reviewed and interpreted outside labs  performed by patient's PCP which showed Hbg 15.2, SCr 0.9, K+ 5 on 01/31/2022  2.  Hypertension -BP is adequately controlled on exam today -continue prescription drug management with amlodipine 56m78maily, Losartan 100m68mily and Lopressor 50mg47m with PRN refills  3.  ASCAD  -hx of-chronically occluded RCA s/p failed PCI with L>>R collaterals.   -he denies any angina -continue prescription drug management with Imdur 30mg 40my and Lopressor 50mg B51mith PRN refills -no ASA due to DOAC  4.  Chronic diastolic CHF -He is euvolemic on exam -he has not required any diuretics    5. OSA - The patient is tolerating PAP therapy well without any problems. The PAP download performed by his DME was personally reviewed and interpreted by me today and showed an AHI of 0.5/hr on 16 cm H2O with 100% compliance in using more than 4 hours nightly.  The patient has been using and benefiting from PAP use and will continue to benefit from therapy.     6.  Hyperlipidemia  -LDL goal < 70 -I have personally reviewed and interpreted outside labs performed by patient's PCP which showed LDL 38 and HDL 48 on 01/31/2022 -continue prescription drug management with Atorvastatin 80mg da40m Lovaza 2gm BID and Praluent with PRN refills   Medication Adjustments/Labs and Tests Ordered: Current medicines are reviewed at length with the patient today.  Concerns regarding medicines are outlined above.  Tests Ordered: Orders Placed This Encounter  Procedures   EKG 12-Lead    Medication Changes: No orders of the defined types were placed in this encounter.    Disposition:  Follow up in 6 month(s)  Signed, Madysin Crisp TuFransico Him/14/2023 9:30 AM    [facility]

## 2022-07-05 ENCOUNTER — Ambulatory Visit: Payer: Medicare Other | Attending: Cardiology | Admitting: Cardiology

## 2022-07-05 ENCOUNTER — Encounter: Payer: Self-pay | Admitting: Cardiology

## 2022-07-05 VITALS — BP 114/60 | HR 72 | Ht 70.0 in | Wt 261.0 lb

## 2022-07-05 DIAGNOSIS — I10 Essential (primary) hypertension: Secondary | ICD-10-CM

## 2022-07-05 DIAGNOSIS — I4819 Other persistent atrial fibrillation: Secondary | ICD-10-CM

## 2022-07-05 DIAGNOSIS — I5032 Chronic diastolic (congestive) heart failure: Secondary | ICD-10-CM

## 2022-07-05 DIAGNOSIS — E782 Mixed hyperlipidemia: Secondary | ICD-10-CM

## 2022-07-05 DIAGNOSIS — I2510 Atherosclerotic heart disease of native coronary artery without angina pectoris: Secondary | ICD-10-CM

## 2022-07-05 DIAGNOSIS — G4733 Obstructive sleep apnea (adult) (pediatric): Secondary | ICD-10-CM

## 2022-07-05 DIAGNOSIS — I2583 Coronary atherosclerosis due to lipid rich plaque: Secondary | ICD-10-CM

## 2022-07-05 NOTE — Patient Instructions (Signed)
Medication Instructions:  Your physician recommends that you continue on your current medications as directed. Please refer to the Current Medication list given to you today.  *If you need a refill on your cardiac medications before your next appointment, please call your pharmacy*   Lab Work: None. If you have labs (blood work) drawn today and your tests are completely normal, you will receive your results only by: [place] (if you have MyChart) OR A paper copy in the mail If you have any lab test that is abnormal or we need to change your treatment, we will call you to review the results.   Testing/Procedures: None.   Follow-Up: At [facility], you and your health needs are our priority.  As part of our continuing mission to provide you with exceptional heart care, we have created designated Provider Care Teams.  These Care Teams include your primary Cardiologist (physician) and Advanced Practice Providers (APPs -  Physician Assistants and Nurse Practitioners) who all work together to provide you with the care you need, when you need it.  We recommend signing up for the patient portal called "MyChart".  Sign up information is provided on this After Visit Summary.  MyChart is used to connect with patients for Virtual Visits (Telemedicine).  Patients are able to view lab/test results, encounter notes, upcoming appointments, etc.  Non-urgent messages can be sent to your provider as well.   To learn more about what you can do with MyChart, go to [email or web address].    Your next appointment:   1 year(s)  The format for your next appointment:   In Person  Provider:   Fransico Him, MD      Important Information About Sugar

## 2022-07-20 ENCOUNTER — Ambulatory Visit (INDEPENDENT_AMBULATORY_CARE_PROVIDER_SITE_OTHER): Payer: Medicare Other | Admitting: Medical

## 2022-07-20 ENCOUNTER — Encounter: Payer: Self-pay | Admitting: Medical

## 2022-07-20 VITALS — BP 132/80 | HR 71 | Temp 98.5°F | Wt 266.2 lb

## 2022-07-20 DIAGNOSIS — E118 Type 2 diabetes mellitus with unspecified complications: Secondary | ICD-10-CM | POA: Diagnosis not present

## 2022-07-20 DIAGNOSIS — M79605 Pain in left leg: Secondary | ICD-10-CM

## 2022-07-20 DIAGNOSIS — M5432 Sciatica, left side: Secondary | ICD-10-CM | POA: Diagnosis not present

## 2022-07-20 DIAGNOSIS — R0989 Other specified symptoms and signs involving the circulatory and respiratory systems: Secondary | ICD-10-CM

## 2022-07-20 MED ORDER — TIRZEPATIDE 7.5 MG/0.5ML ~~LOC~~ SOAJ: 7.5000 mg | SUBCUTANEOUS | 1 refills | Status: DC

## 2022-07-20 MED ORDER — DIAZEPAM 10 MG PO TABS: 10.0000 mg | ORAL_TABLET | Freq: Two times a day (BID) | ORAL | 0 refills | Status: DC | PRN

## 2022-07-20 MED ORDER — TRAMADOL HCL 50 MG PO TABS: 50.0000 mg | ORAL_TABLET | Freq: Three times a day (TID) | ORAL | 0 refills | Status: AC | PRN

## 2022-07-20 NOTE — Progress Notes (Signed)
Subjective: Chief Complaint  Patient presents with   other    Pain in lt. Leg for about 3-4 days, possible sciatic nerve pain,    Here for pain in left leg.  Having pains down left leg buttock down leg even below knee.  Has some tingling in leg.  No back pain.   No recent injury, trauma or heavy lifting.   No incontinence, no weakness otherwise.  No joint pain specifically  He is gaining weight despite Ozempic.  Not sure that things are working as good as they could.  Exercises some with walking.  He was unable to ABI screen in the past due to insurance.  he has new insurance now would likely try again   Past Medical History:  Diagnosis Date   Atrial flutter ([place])    a. atypical atrial flutter.   Bell's palsy    CAD (coronary artery disease)    a. 07/2009 Cath: LM nl, LAD nl, LCX nl w/ L->R collats, chronically occluded RCA s/p failed PCI;  b. 07/2012 MV: basal inf mild ischemia.   Central retinal artery occlusion, left eye    diagnosed in 2008   Cerebrovascular accident [facility])    a. 03/2008 secondary to cardioembolic event;  b. chronic coumadin.   Chronic diastolic CHF (congestive heart failure) ([place])    a. 12/2008 Echo: nl EF.   Diabetes mellitus 11/14/2010   TYPE II   Hematuria 05/2013   Urology, Dr. Estill Dooms   Hyperlipidemia    Hypertension    Hypogonadism, male 05/2013   Dr. Estill Dooms, Urology   Morbid obesity [facility])    Obesity    Obstructive sleep apnea    Persistent atrial fibrillation [facility])    a. s/p afib ablation by [facility] 08/01/08   Recommendation refused by patient    multiple recommended vaccines refused over the years (flu/pneumovax)   Skin infection    chronic left leg herpetic   Current Outpatient Medications on File Prior to Visit  Medication Sig Dispense Refill   Accu-Chek Softclix Lancets lancets Test 1-2 times daily 100 each 1   acetaminophen (TYLENOL) 500 MG tablet Take 500 mg by mouth as needed for mild pain or headache.     Alirocumab (PRALUENT) 75 MG/ML SOAJ  Inject 75 mg into the skin every 14 (fourteen) days. 6 mL 4   amLODipine (NORVASC) 5 MG tablet Take 1 tablet by mouth once daily 90 tablet 1   apixaban (ELIQUIS) 5 MG TABS tablet Take 1 tablet (5 mg total) by mouth 2 (two) times daily. 180 tablet 3   atorvastatin (LIPITOR) 80 MG tablet Take 1 tablet (80 mg total) by mouth daily. 90 tablet 3   b complex vitamins capsule Take 1 capsule by mouth every other day.      BD PEN NEEDLE NANO 2ND GEN 32G X 4 MM MISC USE 1 PEN NEEDLE AT BEDTIME 100 each 0   Blood Glucose Monitoring Suppl (ACCU-CHEK GUIDE ME) w/Device KIT Test 1-2 times daily 1 kit 0   clobetasol (TEMOVATE) 0.05 % external solution Apply 1 application topically daily as needed (rash).     cyanocobalamin (VITAMIN B12) 1000 MCG/ML injection INJECT 1 ML INTRAMUSCULARLY  EVERY TWO WEEKS 2 mL 1   glucose blood (ACCU-CHEK GUIDE) test strip USE 1 STRIP TO CHECK GLUCOSE ONCE TO TWICE DAILY 100 each 0   insulin degludec (TRESIBA FLEXTOUCH) 100 UNIT/ML FlexTouch Pen Inject 18 Units into the skin at bedtime. 15 mL 3   isosorbide mononitrate (IMDUR) 30  MG 24 hr tablet Take 1 tablet (30 mg total) by mouth daily. 90 tablet 3   losartan (COZAAR) 100 MG tablet Take 1 tablet (100 mg total) by mouth daily. 90 tablet 2   metoprolol tartrate (LOPRESSOR) 50 MG tablet TAKE 1 TABLET BY MOUTH TWICE DAILY MAY  TAKE  1  EXTRA  IF  HEART  RATE  IS  110  OR  HIGHER. 180 tablet 0   omega-3 acid ethyl esters (LOVAZA) 1 g capsule Take 2 capsules (2 g total) by mouth 2 (two) times daily. 360 capsule 3   OZEMPIC, 2 MG/DOSE, 8 MG/3ML SOPN INJECT 2 MG SUBCUTANEOUSLY ONCE A WEEK 9 mL 0   Semaglutide, 2 MG/DOSE, (OZEMPIC, 2 MG/DOSE,) 8 MG/3ML SOPN Inject 2 mg into the skin once a week. 9 mL 0   SYRINGE/NEEDLE, DISP, 1 ML (BD ECLIPSE SYRINGE) 25G X 5/8" 1 ML MISC Use as needed for b12 injections 50 each 5   testosterone (ANDROGEL) 50 MG/5GM (1%) GEL APPLY 2 PACKETS TOPICALLY ONTO THE SKIN ONCE DAILY     valACYclovir (VALTREX) 500  MG tablet Take 1 tablet by mouth once daily 90 tablet 0   vitamin B-12 (CYANOCOBALAMIN) 1000 MCG tablet Take 1 tablet (1,000 mcg total) by mouth daily. 90 tablet 3   No current facility-administered medications on file prior to visit.   Past Surgical History:  Procedure Laterality Date   ATRIAL ABLATION SURGERY  08/01/08   CTI and PVI ablation by [facility]   COLONOSCOPY     2012 per patient   ELECTROPHYSIOLOGIC STUDY N/A 11/30/2014   Procedure: Atrial Fibrillation Ablation;  Surgeon: [name], MD;  Location: [place] [facility] LAB;  Service: Cardiovascular;  Laterality: N/A;   TEE WITHOUT CARDIOVERSION N/A 11/30/2014   Procedure: TRANSESOPHAGEAL ECHOCARDIOGRAM (TEE);  Surgeon: Josue Hector, MD;  Location: [facility] ENDOSCOPY;  Service: Cardiovascular;  Laterality: N/A;   ROS as in subjective   Objective: BP 132/80   Pulse 71   Temp 98.5 F (36.9 C)   Wt 266 lb 3.2 oz (120.7 kg)   BMI 38.20 kg/m   Wt Readings from Last 3 Encounters:  07/20/22 266 lb 3.2 oz (120.7 kg)  07/05/22 261 lb (118.4 kg)  01/31/22 256 lb 6.4 oz (116.3 kg)   Gen: wd, wn, nad Nontender in the back, no obvious deformity or scoliosis Legs and buttock nontender, he does have decreased hip range of motion internal bilaterally, no swelling or other deformity, Range of motion of back is limited to about 80% of normal with flexion and extension Negative straight leg raise, legs neurovascularly intact 1+ pedal pulses No lower extremity edema    Assessment: Encounter Diagnoses  Name Primary?   Sciatica of left side Yes   Pain of left lower extremity    Decreased pedal pulses    Diabetes mellitus with complication (HCC)      Plan: Sciatica flare-begin Valium once or twice a day for the next few days, advised stretching, relative rest.  Consider massage or chiropractic therapy.  For worse breakthrough pain can use Ultram.  Discussed risk and benefits of both IM and Ultram.  We discussed not using either medicine  within 4 hours of each other.  If not much improved within the next week lets consider physical therapy referral  Diabetes-stop Ozempic and change to [facility] if we can get this approved by insurance.  We discussed the risk and benefits of medication.  Discussed the need to work on healthy diet and  exercise along with Mounjaro to see weight loss benefits  Updated ABI screening ordered today  Karanveer was seen today for other.  Diagnoses and all orders for this visit:  Sciatica of left side  Pain of left lower extremity  Decreased pedal pulses -     VAS [place] ABI WITH/WO TBI; Future  Diabetes mellitus with complication (HCC) -     VAS [place] ABI WITH/WO TBI; Future  Other orders -     diazepam (VALIUM) 10 MG tablet; Take 1 tablet (10 mg total) by mouth every 12 (twelve) hours as needed for anxiety. -     traMADol (ULTRAM) 50 MG tablet; Take 1 tablet (50 mg total) by mouth every 8 (eight) hours as needed for up to 5 days. -     tirzepatide (MOUNJARO) 7.5 MG/0.5ML Pen; Inject 7.5 mg into the skin once a week.    F/u 1-29mo

## 2022-07-23 DIAGNOSIS — G4733 Obstructive sleep apnea (adult) (pediatric): Secondary | ICD-10-CM | POA: Diagnosis not present

## 2022-07-23 DIAGNOSIS — I10 Essential (primary) hypertension: Secondary | ICD-10-CM | POA: Diagnosis not present

## 2022-08-12 ENCOUNTER — Telehealth: Payer: Self-pay | Admitting: Medical

## 2022-08-12 NOTE — Telephone Encounter (Signed)
P.A. [place]

## 2022-08-23 ENCOUNTER — Encounter: Payer: Self-pay | Admitting: Nurse Practitioner

## 2022-08-23 ENCOUNTER — Telehealth (INDEPENDENT_AMBULATORY_CARE_PROVIDER_SITE_OTHER): Payer: 59 | Admitting: Nurse Practitioner

## 2022-08-23 VITALS — BP 123/69 | HR 108 | Ht 70.0 in | Wt 256.0 lb

## 2022-08-23 DIAGNOSIS — J111 Influenza due to unidentified influenza virus with other respiratory manifestations: Secondary | ICD-10-CM

## 2022-08-23 MED ORDER — BENZONATATE 200 MG PO CAPS: 200.0000 mg | ORAL_CAPSULE | Freq: Three times a day (TID) | ORAL | 0 refills | Status: DC | PRN

## 2022-08-23 MED ORDER — OSELTAMIVIR PHOSPHATE 75 MG PO CAPS: 75.0000 mg | ORAL_CAPSULE | Freq: Two times a day (BID) | ORAL | 0 refills | Status: DC

## 2022-08-25 NOTE — Telephone Encounter (Signed)
P.A. approved til 02/10/23, sent mychart message

## 2022-08-29 ENCOUNTER — Other Ambulatory Visit: Payer: Self-pay | Admitting: Medical

## 2022-08-29 DIAGNOSIS — I10 Essential (primary) hypertension: Secondary | ICD-10-CM | POA: Diagnosis not present

## 2022-08-29 DIAGNOSIS — G4733 Obstructive sleep apnea (adult) (pediatric): Secondary | ICD-10-CM | POA: Diagnosis not present

## 2022-08-30 ENCOUNTER — Encounter (HOSPITAL_COMMUNITY): Payer: Self-pay | Admitting: *Deleted

## 2022-08-31 DIAGNOSIS — J111 Influenza due to unidentified influenza virus with other respiratory manifestations: Secondary | ICD-10-CM | POA: Insufficient documentation

## 2022-08-31 NOTE — Progress Notes (Signed)
Virtual Visit Encounter mychart visit.   I connected with  [name] on 08/31/22 at  3:00 PM EST by secure video and audio telemedicine application. I verified that I am speaking with the correct person using two identifiers.   I introduced myself as a Designer, jewellery with the practice. The limitations of evaluation and management by telemedicine discussed with the patient and the availability of in person appointments. The patient expressed verbal understanding and consent to proceed.  Participating parties in this visit include: Myself and patient  The patient is: Patient Location: Home I am: Provider Location: Office/Clinic Subjective:    CC and HPI: [name] is a 72 y.o. year old male presenting for new evaluation and treatment of Influenza-like symptoms. Patient reports the following: [name] reports sudden onset of fever, chills, body aches, and sore throat this morning after recent exposure to several people with the flu. He tells me that in general he does not feel well. He is not having any N/V/D or shortness of breath.   Past medical history, Surgical history, Family history not pertinant except as noted below, Social history, Allergies, and medications have been entered into the medical record, reviewed, and corrections made.   Review of Systems:  All review of systems negative except what is listed in the HPI  Objective:    Alert and oriented x 4 Speaking in clear sentences with no shortness of breath. No distress.  Impression and Recommendations:    Problem List Items Addressed This Visit     Influenza - Primary    Flu like symptoms with onset this morning. Flu exposure over the weekend makes the diagnosis most likely the flu given the symptoms and presentation. He is not flu vaccinated this season. PLAN: - Start tamiflu immediately - increase hydration and rest as much as possible - use over the counter medication, such as tylenol, for fevers  and body  aches.  - some cold formulations can make blood pressure run high, use caution with over the counter medications aside from this specifically formulated for high blood pressure.  - tessalon provided for cough - stay away from others for at least 5 days or until symptoms are improving and fever free for more than 24 hours without medication. - follow-up if symptoms fail to improve or change.       Relevant Medications   oseltamivir (TAMIFLU) 75 MG capsule   benzonatate (TESSALON) 200 MG capsule    orders and follow up as documented in EMR I discussed the assessment and treatment plan with the patient. The patient was provided an opportunity to ask questions and all were answered. The patient agreed with the plan and demonstrated an understanding of the instructions.   The patient was advised to call back or seek an in-person evaluation if the symptoms worsen or if the condition fails to improve as anticipated.  Follow-Up: prn  I provided 24 minutes of non-face-to-face interaction with this non face-to-face encounter including intake, same-day documentation, and chart review.   Orma Render, NP , DNP, AGNP-c [facility]

## 2022-08-31 NOTE — Assessment & Plan Note (Signed)
Flu like symptoms with onset this morning. Flu exposure over the weekend makes the diagnosis most likely the flu given the symptoms and presentation. He is not flu vaccinated this season. PLAN: - Start tamiflu immediately - increase hydration and rest as much as possible - use over the counter medication, such as tylenol, for fevers  and body aches.  - some cold formulations can make blood pressure run high, use caution with over the counter medications aside from this specifically formulated for high blood pressure.  - tessalon provided for cough - stay away from others for at least 5 days or until symptoms are improving and fever free for more than 24 hours without medication. - follow-up if symptoms fail to improve or change.

## 2022-09-01 ENCOUNTER — Other Ambulatory Visit: Payer: Self-pay | Admitting: Medical

## 2022-09-08 ENCOUNTER — Other Ambulatory Visit: Payer: Self-pay | Admitting: Cardiology

## 2022-09-22 ENCOUNTER — Other Ambulatory Visit: Payer: Self-pay | Admitting: Medical

## 2022-09-28 ENCOUNTER — Other Ambulatory Visit: Payer: Self-pay | Admitting: Medical

## 2022-09-29 ENCOUNTER — Other Ambulatory Visit: Payer: Self-pay | Admitting: Cardiology

## 2022-09-29 DIAGNOSIS — I4819 Other persistent atrial fibrillation: Secondary | ICD-10-CM

## 2022-10-01 NOTE — Telephone Encounter (Signed)
Prescription refill request for Eliquis received. Indication: Afib  Last office visit: 07/05/22 [facility])  Scr: 0.90 (01/31/22)  Age: 72 Weight: 116.kg  Appropriate dose. Refill sent.

## 2022-10-04 ENCOUNTER — Other Ambulatory Visit: Payer: Self-pay

## 2022-10-04 DIAGNOSIS — I4819 Other persistent atrial fibrillation: Secondary | ICD-10-CM

## 2022-10-04 MED ORDER — METOPROLOL TARTRATE 50 MG PO TABS: ORAL_TABLET | ORAL | 2 refills | Status: DC

## 2022-10-15 ENCOUNTER — Other Ambulatory Visit: Payer: Self-pay | Admitting: Cardiology

## 2022-10-22 DIAGNOSIS — I10 Essential (primary) hypertension: Secondary | ICD-10-CM | POA: Diagnosis not present

## 2022-10-22 DIAGNOSIS — G4733 Obstructive sleep apnea (adult) (pediatric): Secondary | ICD-10-CM | POA: Diagnosis not present

## 2022-11-19 ENCOUNTER — Other Ambulatory Visit: Payer: Self-pay | Admitting: Medical

## 2022-11-19 DIAGNOSIS — Z960 Presence of urogenital implants: Secondary | ICD-10-CM | POA: Diagnosis not present

## 2022-11-19 DIAGNOSIS — N138 Other obstructive and reflux uropathy: Secondary | ICD-10-CM | POA: Diagnosis not present

## 2022-11-27 DIAGNOSIS — G4733 Obstructive sleep apnea (adult) (pediatric): Secondary | ICD-10-CM | POA: Diagnosis not present

## 2022-11-27 DIAGNOSIS — I10 Essential (primary) hypertension: Secondary | ICD-10-CM | POA: Diagnosis not present

## 2022-11-30 DIAGNOSIS — I10 Essential (primary) hypertension: Secondary | ICD-10-CM | POA: Diagnosis not present

## 2022-11-30 DIAGNOSIS — G4733 Obstructive sleep apnea (adult) (pediatric): Secondary | ICD-10-CM | POA: Diagnosis not present

## 2022-12-17 ENCOUNTER — Other Ambulatory Visit: Payer: Self-pay | Admitting: Medical

## 2022-12-27 ENCOUNTER — Other Ambulatory Visit: Payer: Self-pay | Admitting: Medical

## 2022-12-28 ENCOUNTER — Other Ambulatory Visit: Payer: Self-pay | Admitting: Medical

## 2023-01-06 ENCOUNTER — Other Ambulatory Visit: Payer: Self-pay

## 2023-01-06 ENCOUNTER — Encounter: Payer: Self-pay | Admitting: Emergency Medicine

## 2023-01-06 ENCOUNTER — Ambulatory Visit
Admission: EM | Admit: 2023-01-06 | Discharge: 2023-01-06 | Disposition: A | Discharge status: Home / Self Care | Payer: 59 | Attending: Physician Assistant | Admitting: Physician Assistant

## 2023-01-06 DIAGNOSIS — J111 Influenza due to unidentified influenza virus with other respiratory manifestations: Secondary | ICD-10-CM | POA: Diagnosis not present

## 2023-01-06 DIAGNOSIS — J069 Acute upper respiratory infection, unspecified: Secondary | ICD-10-CM | POA: Diagnosis not present

## 2023-01-06 DIAGNOSIS — Z1152 Encounter for screening for COVID-19: Secondary | ICD-10-CM | POA: Insufficient documentation

## 2023-01-06 MED ORDER — BENZONATATE 200 MG PO CAPS
200.0000 mg | ORAL_CAPSULE | Freq: Three times a day (TID) | ORAL | 0 refills | Status: DC | PRN
Start: 2023-01-06 — End: 2023-01-06

## 2023-01-06 MED ORDER — FLUTICASONE PROPIONATE 50 MCG/ACT NA SUSP: 1.0000 | Freq: Every day | NASAL | 0 refills | Status: DC

## 2023-01-06 MED ORDER — BENZONATATE 200 MG PO CAPS: 200.0000 mg | ORAL_CAPSULE | Freq: Three times a day (TID) | ORAL | 0 refills | Status: DC | PRN

## 2023-01-06 NOTE — Discharge Instructions (Signed)
I believe that you have a virus.  Please use fluticasone to help with your congestion.  I also recommend nasal saline and sinus rinses.  Use Tessalon for cough.  We will contact you if you are positive for COVID.  Make sure that you rest and drink plenty of fluid.  Use a humidifier as well as over-the-counter medications such as Mucinex and Tylenol for additional symptom relief.  If your symptoms or not improving within a week please return for reevaluation.  If anything worsens you have fever, worsening cough, shortness of breath you should be seen immediately.

## 2023-01-06 NOTE — ED Provider Notes (Addendum)
[facility]    CSN: [id number] Arrival date & time: 01/06/23  1431      History   Chief Complaint Chief Complaint  Patient presents with   Sore Throat    HPI [name] is a 72 y.o. male.   Patient presents today with a 1 to 2-day history of URI symptoms.  Reports rhinorrhea, coughing, nasal congestion, scratchy/sore throat.  Denies any fever, nausea, vomiting, diarrhea, chest pain, shortness of breath.  Does report his wife has been sick with recently diagnosed with a sinus infection.  He has tried daytime multisymptom medication without improvement of symptoms.  He does have a history of seasonal allergies but is not taking any medication for this at this time.  Denies any recent antibiotics or steroids.  He has never had COVID in the past.  He has not had COVID-19 vaccines.  Denies any history of asthma, COPD, smoking.    Past Medical History:  Diagnosis Date   Atrial flutter (HCC)    a. atypical atrial flutter.   Bell's palsy    CAD (coronary artery disease)    a. 07/2009 Cath: LM nl, LAD nl, LCX nl w/ L->R collats, chronically occluded RCA s/p failed PCI;  b. 07/2012 MV: basal inf mild ischemia.   Central retinal artery occlusion, left eye    diagnosed in 2008   Cerebrovascular accident [facility])    a. 03/2008 secondary to cardioembolic event;  b. chronic coumadin.   Chronic diastolic CHF (congestive heart failure) (HCC)    a. 12/2008 Echo: nl EF.   Diabetes mellitus 11/14/2010   TYPE II   Hematuria 05/2013   Urology, Dr. Lindley Magnus   Hyperlipidemia    Hypertension    Hypogonadism, male 05/2013   Dr. Lindley Magnus, Urology   Morbid obesity [facility])    Obesity    Obstructive sleep apnea    Persistent atrial fibrillation (HCC)    a. s/p afib ablation by [facility] 08/01/08   Recommendation refused by patient    multiple recommended vaccines refused over the years (flu/pneumovax)   Skin infection    chronic left leg herpetic    Patient Active Problem List   Diagnosis Date  Noted   Influenza 08/31/2022   Encounter for screening for vascular disease 01/31/2022   Decreased pedal pulses 01/31/2022   History of stroke 01/31/2022   B12 deficiency 01/26/2021   Unspecified lack of coordination  01/11/2021   Anticoagulant long-term use 01/11/2021   Trigger middle finger of left hand 01/11/2021   History of atrial fibrillation 01/11/2021   Dyslipidemia associated with type 2 diabetes mellitus (HCC) 01/11/2021   Tubular adenoma of colon 08/21/2016   Vaccine refused by patient 06/06/2016   Vitamin D deficiency 06/06/2016   Chronic diastolic heart failure (HCC) 01/27/2016   Typical atrial flutter (HCC)    Hearing loss 06/22/2014   OSA (obstructive sleep apnea) 06/22/2014   Mixed dyslipidemia 06/22/2014   Medicare annual wellness visit, subsequent 09/09/2013   Unspecified cerebral artery occlusion with cerebral infarction 05/08/2013   Diabetes mellitus with complication (HCC) 07/02/2011   Essential hypertension, benign 07/02/2011   Hypogonadism male 07/02/2011   Morbid obesity (HCC) 02/01/2010   CAD (coronary artery disease) 07/23/2009   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   Persistent atrial fibrillation (HCC) 09/06/2008    Past Surgical History:  Procedure Laterality Date   ATRIAL ABLATION SURGERY  08/01/08   CTI and PVI ablation by [facility]   COLONOSCOPY     2012 per patient  ELECTROPHYSIOLOGIC STUDY N/A 11/30/2014   Procedure: Atrial Fibrillation Ablation;  Surgeon: Hillis Range, MD;  Location: [facility] INVASIVE CV LAB;  Service: Cardiovascular;  Laterality: N/A;   TEE WITHOUT CARDIOVERSION N/A 11/30/2014   Procedure: TRANSESOPHAGEAL ECHOCARDIOGRAM (TEE);  Surgeon: Wendall Stade, MD;  Location: [facility] ENDOSCOPY;  Service: Cardiovascular;  Laterality: N/A;       Home Medications    Prior to Admission medications   Medication Sig Start Date End Date Taking? Authorizing Provider  benzonatate (TESSALON) 200 MG capsule Take 1 capsule (200 mg total) by mouth 3  (three) times daily as needed for cough. 01/06/23  Yes Adham Johnson K, PA-C  fluticasone (FLONASE) 50 MCG/ACT nasal spray Place 1 spray into both nostrils daily. 01/06/23  Yes Timiyah Romito, Noberto Retort, PA-C  Accu-Chek Softclix Lancets lancets Test 1-2 times daily 07/10/21   Tysinger, Kermit Balo, PA-C  acetaminophen (TYLENOL) 500 MG tablet Take 500 mg by mouth as needed for mild pain or headache.    [provider]  Alirocumab (PRALUENT) 75 MG/ML SOAJ Inject 75 mg into the skin every 14 (fourteen) days. 02/01/22   Tysinger, Kermit Balo, PA-C  amLODipine (NORVASC) 5 MG tablet Take 1 tablet (5 mg total) by mouth daily. 10/01/22   Quintella Reichert, MD  apixaban (ELIQUIS) 5 MG TABS tablet Take 1 tablet by mouth twice daily 10/01/22   Quintella Reichert, MD  atorvastatin (LIPITOR) 80 MG tablet Take 1 tablet by mouth once daily 10/16/22   Quintella Reichert, MD  b complex vitamins capsule Take 1 capsule by mouth every other day.     [provider]  Blood Glucose Monitoring Suppl (ACCU-CHEK GUIDE ME) w/Device KIT Test 1-2 times daily 07/10/21   Tysinger, Kermit Balo, PA-C  clobetasol (TEMOVATE) 0.05 % external solution Apply 1 application topically daily as needed (rash). Patient not taking: Reported on 08/23/2022 09/23/20   [provider]  cyanocobalamin (VITAMIN B12) 1000 MCG/ML injection INJECT 1 ML  INTRAMUSCULARLY EVERY TWO WEEKS 12/28/22   Tysinger, Kermit Balo, PA-C  diazepam (VALIUM) 10 MG tablet Take 1 tablet (10 mg total) by mouth every 12 (twelve) hours as needed for anxiety. 07/20/22   Tysinger, Kermit Balo, PA-C  glucose blood (ACCU-CHEK GUIDE) test strip USE 1 STRIP TO CHECK GLUCOSE ONCE TO TWICE DAILY 11/19/22   Tysinger, Kermit Balo, PA-C  insulin degludec (TRESIBA FLEXTOUCH) 100 UNIT/ML FlexTouch Pen Inject 18 Units into the skin at bedtime. 02/01/22   Tysinger, Kermit Balo, PA-C  Insulin Pen Needle (BD PEN NEEDLE NANO 2ND GEN) 32G X 4 MM MISC USE  NEEDLES AT BEDTIME 09/03/22   Tysinger, Kermit Balo, PA-C  isosorbide  mononitrate (IMDUR) 30 MG 24 hr tablet Take 1 tablet (30 mg total) by mouth daily. 09/10/22   Quintella Reichert, MD  losartan (COZAAR) 100 MG tablet Take 1 tablet by mouth once daily 10/16/22   Quintella Reichert, MD  metoprolol tartrate (LOPRESSOR) 50 MG tablet TAKE 1 TABLET BY MOUTH TWICE DAILY MAY  TAKE  1  EXTRA  IF  HEART  RATE  IS  110  OR  HIGHER. 10/04/22   Quintella Reichert, MD  omega-3 acid ethyl esters (LOVAZA) 1 g capsule Take 2 capsules (2 g total) by mouth 2 (two) times daily. 02/01/22   Tysinger, Kermit Balo, PA-C  oseltamivir (TAMIFLU) 75 MG capsule Take 1 capsule (75 mg total) by mouth 2 (two) times daily. 08/23/22   Tollie Eth, NP  OZEMPIC, 2 MG/DOSE, 8 MG/3ML  SOPN INJECT 2 MG SUBCUTANEOUSLY ONCE A WEEK Patient not taking: Reported on 08/23/2022 06/11/22   Tysinger, Kermit Balo, PA-C  Semaglutide, 2 MG/DOSE, (OZEMPIC, 2 MG/DOSE,) 8 MG/3ML SOPN Inject 2 mg into the skin once a week. Patient not taking: Reported on 08/23/2022 02/28/22   Tysinger, Kermit Balo, PA-C  SYRINGE/NEEDLE, DISP, 1 ML (BD ECLIPSE SYRINGE) 25G X 5/8" 1 ML MISC Use as needed for b12 injections 02/01/22   Tysinger, Kermit Balo, PA-C  testosterone (ANDROGEL) 50 MG/5GM (1%) GEL APPLY 2 PACKETS TOPICALLY ONTO THE SKIN ONCE DAILY 10/22/20   [provider]  tirzepatide Greggory Keen) 7.5 MG/0.5ML Pen INJECT 7.5MG  SUBCUTANEOUSLY ONCE A WEEK 12/28/22   Tysinger, Kermit Balo, PA-C  valACYclovir (VALTREX) 500 MG tablet Take 1 tablet by mouth once daily 08/30/22   Tysinger, Kermit Balo, PA-C  vitamin B-12 (CYANOCOBALAMIN) 1000 MCG tablet Take 1 tablet (1,000 mcg total) by mouth daily. 02/01/22   Tysinger, Kermit Balo, PA-C    Family History Family History  Problem Relation Age of Onset   Stroke Father    Heart disease Father    Heart disease Mother     Social History Social History   Tobacco Use   Smoking status: Former    Types: Cigarettes    Quit date: 07/24/1979    Years since quitting: 43.4   Smokeless tobacco: Never  Vaping Use   Vaping Use: Never  used  Substance Use Topics   Alcohol use: No    Comment: former   Drug use: No    Comment: former     Allergies   Crestor [rosuvastatin], Zetia [ezetimibe], Lisinopril, Farxiga [dapagliflozin], and Metformin and related   Review of Systems Review of Systems  Constitutional:  Positive for activity change. Negative for appetite change, fatigue and fever.  HENT:  Positive for congestion, rhinorrhea and sore throat. Negative for sinus pressure and sneezing.   Respiratory:  Positive for cough. Negative for shortness of breath.   Cardiovascular:  Negative for chest pain.  Gastrointestinal:  Negative for abdominal pain, diarrhea, nausea and vomiting.  Neurological:  Negative for dizziness, light-headedness and headaches.     Physical Exam Triage Vital Signs ED Triage Vitals  Enc Vitals Group     BP 01/06/23 1441 (!) 151/76     Pulse Rate 01/06/23 1441 89     Resp 01/06/23 1441 20     Temp 01/06/23 1441 97.9 F (36.6 C)     Temp Source 01/06/23 1441 Oral     SpO2 01/06/23 1441 96 %     Weight --      Height --      Head Circumference --      Peak Flow --      Pain Score 01/06/23 1440 0     Pain Loc --      Pain Edu? --      Excl. in GC? --    No data found.  Updated Vital Signs BP (!) 151/76 (BP Location: Right Arm)   Pulse 89   Temp 97.9 F (36.6 C) (Oral)   Resp 20   SpO2 96%   Visual Acuity Right Eye Distance:   Left Eye Distance:   Bilateral Distance:    Right Eye Near:   Left Eye Near:    Bilateral Near:     Physical Exam Vitals reviewed.  Constitutional:      General: He is awake.     Appearance: Normal appearance. He is well-developed. He is not ill-appearing.  Comments: Very pleasant male appears stated age in no acute distress sitting comfortably in exam room  HENT:     Head: Normocephalic and atraumatic.     Right Ear: Tympanic membrane, ear canal and external ear normal. Tympanic membrane is not erythematous or bulging.     Left Ear:  Tympanic membrane, ear canal and external ear normal. Tympanic membrane is not erythematous or bulging.     Nose: Nose normal.     Mouth/Throat:     Pharynx: Uvula midline. Posterior oropharyngeal erythema present. No oropharyngeal exudate.  Cardiovascular:     Rate and Rhythm: Normal rate and regular rhythm.     Heart sounds: Normal heart sounds, S1 normal and S2 normal. No murmur heard. Pulmonary:     Effort: Pulmonary effort is normal. No accessory muscle usage or respiratory distress.     Breath sounds: Normal breath sounds. No stridor. No wheezing, rhonchi or rales.     Comments: Clear to auscultation bilaterally Neurological:     Mental Status: He is alert.  Psychiatric:        Behavior: Behavior is cooperative.      UC Treatments / Results  Labs (all labs ordered are listed, but only abnormal results are displayed) Labs Reviewed  SARS CORONAVIRUS 2 (TAT 6-24 HRS)    EKG   Radiology No results found.  Procedures Procedures (including critical care time)  Medications Ordered in UC Medications - No data to display  Initial Impression / Assessment and Plan / UC Course  I have reviewed the triage vital signs and the nursing notes.  Pertinent labs & imaging results that were available during my care of the patient were reviewed by me and considered in my medical decision making (see chart for details).     Patient is well-appearing, afebrile, nontoxic, nontachycardic.  Chest x-ray was deferred as he had no adventitious lung sounds on exam and oxygen saturation was 96%.  No evidence of acute infection that would warrant initiation of antibiotics.  Discussed likely viral etiology versus allergies.  He was swabbed for COVID-19 and we will contact him if this is positive.  Given his age and past medical history he is a candidate for antiviral therapy.  We do not have a recent metabolic panel so would recommend molnupiravir since this does not require renal dosing.  He was  encouraged to use over-the-counter medications including Mucinex, Flonase, Tylenol.  Also recommend nasal saline and humidifier.  He was given Tessalon for cough.  Discussed that if symptoms or not improving within a week he is to return for reevaluation.  If he has any worsening symptoms including fever, worsening cough, shortness of breath he needs to be seen immediately.  Strict return precautions given.  Final Clinical Impressions(s) / UC Diagnoses   Final diagnoses:  Upper respiratory tract infection, unspecified type     Discharge Instructions      I believe that you have a virus.  Please use fluticasone to help with your congestion.  I also recommend nasal saline and sinus rinses.  Use Tessalon for cough.  We will contact you if you are positive for COVID.  Make sure that you rest and drink plenty of fluid.  Use a humidifier as well as over-the-counter medications such as Mucinex and Tylenol for additional symptom relief.  If your symptoms or not improving within a week please return for reevaluation.  If anything worsens you have fever, worsening cough, shortness of breath you should be seen immediately.  ED Prescriptions     Medication Sig Dispense Auth. Provider   benzonatate (TESSALON) 200 MG capsule  (Status: Discontinued) Take 1 capsule (200 mg total) by mouth 3 (three) times daily as needed for cough. 30 capsule Greene Diodato K, PA-C   fluticasone (FLONASE) 50 MCG/ACT nasal spray Place 1 spray into both nostrils daily. 16 g Marquez Ceesay K, PA-C   benzonatate (TESSALON) 200 MG capsule Take 1 capsule (200 mg total) by mouth 3 (three) times daily as needed for cough. 20 capsule Virdell Hoiland, Noberto Retort, PA-C      PDMP not reviewed this encounter.   Jeani Hawking, PA-C 01/06/23 1551    RaspetNoberto Retort, PA-C 01/06/23 1553    RaspetNoberto Retort, PA-C 01/06/23 1553

## 2023-01-06 NOTE — ED Triage Notes (Signed)
Pt reports runny nose, nasal drainage, sore throat since last night. Denies any known fevers.

## 2023-01-09 LAB — SARS CORONAVIRUS 2 (TAT 6-24 HRS): SARS Coronavirus 2: NEGATIVE

## 2023-01-14 ENCOUNTER — Telehealth: Payer: Self-pay | Admitting: Cardiology

## 2023-01-14 DIAGNOSIS — I2510 Atherosclerotic heart disease of native coronary artery without angina pectoris: Secondary | ICD-10-CM

## 2023-01-14 DIAGNOSIS — I10 Essential (primary) hypertension: Secondary | ICD-10-CM

## 2023-01-14 DIAGNOSIS — G4733 Obstructive sleep apnea (adult) (pediatric): Secondary | ICD-10-CM

## 2023-01-14 NOTE — Telephone Encounter (Signed)
Pt's wife calling regarding the pt's CPAP machine giving an error message saying "[employer] Extended and to contact the provider." She would like a callback regarding this matter. Please advise.

## 2023-01-15 ENCOUNTER — Emergency Department (HOSPITAL_COMMUNITY): Payer: 59

## 2023-01-15 ENCOUNTER — Emergency Department (HOSPITAL_COMMUNITY)
Admission: EM | Admit: 2023-01-15 | Discharge: 2023-01-15 | Disposition: A | Discharge status: Home / Self Care | Payer: 59 | Attending: Emergency Medicine | Admitting: Emergency Medicine

## 2023-01-15 ENCOUNTER — Other Ambulatory Visit: Payer: Self-pay

## 2023-01-15 ENCOUNTER — Encounter (HOSPITAL_COMMUNITY): Payer: Self-pay | Admitting: Emergency Medicine

## 2023-01-15 ENCOUNTER — Telehealth: Payer: Self-pay | Admitting: Cardiology

## 2023-01-15 ENCOUNTER — Ambulatory Visit (HOSPITAL_BASED_OUTPATIENT_CLINIC_OR_DEPARTMENT_OTHER)
Admission: RE | Admit: 2023-01-15 | Discharge: 2023-01-15 | Disposition: A | Discharge status: Home / Self Care | Payer: 59 | Source: Ambulatory Visit | Attending: Internal Medicine | Admitting: Internal Medicine

## 2023-01-15 VITALS — BP 98/76 | HR 150 | Ht 70.0 in | Wt 268.8 lb

## 2023-01-15 DIAGNOSIS — Z79899 Other long term (current) drug therapy: Secondary | ICD-10-CM | POA: Diagnosis not present

## 2023-01-15 DIAGNOSIS — Z7901 Long term (current) use of anticoagulants: Secondary | ICD-10-CM | POA: Insufficient documentation

## 2023-01-15 DIAGNOSIS — I4891 Unspecified atrial fibrillation: Secondary | ICD-10-CM | POA: Diagnosis not present

## 2023-01-15 DIAGNOSIS — J9811 Atelectasis: Secondary | ICD-10-CM | POA: Diagnosis not present

## 2023-01-15 DIAGNOSIS — Z8673 Personal history of transient ischemic attack (TIA), and cerebral infarction without residual deficits: Secondary | ICD-10-CM | POA: Insufficient documentation

## 2023-01-15 DIAGNOSIS — Z944 Liver transplant status: Secondary | ICD-10-CM | POA: Diagnosis not present

## 2023-01-15 DIAGNOSIS — G4733 Obstructive sleep apnea (adult) (pediatric): Secondary | ICD-10-CM | POA: Insufficient documentation

## 2023-01-15 DIAGNOSIS — E119 Type 2 diabetes mellitus without complications: Secondary | ICD-10-CM | POA: Insufficient documentation

## 2023-01-15 DIAGNOSIS — Z794 Long term (current) use of insulin: Secondary | ICD-10-CM | POA: Insufficient documentation

## 2023-01-15 DIAGNOSIS — D6869 Other thrombophilia: Secondary | ICD-10-CM | POA: Insufficient documentation

## 2023-01-15 DIAGNOSIS — I4892 Unspecified atrial flutter: Secondary | ICD-10-CM | POA: Insufficient documentation

## 2023-01-15 DIAGNOSIS — I4819 Other persistent atrial fibrillation: Secondary | ICD-10-CM | POA: Insufficient documentation

## 2023-01-15 DIAGNOSIS — I2510 Atherosclerotic heart disease of native coronary artery without angina pectoris: Secondary | ICD-10-CM | POA: Insufficient documentation

## 2023-01-15 DIAGNOSIS — I483 Typical atrial flutter: Secondary | ICD-10-CM

## 2023-01-15 LAB — CBC
HCT: 44.5 % (ref 39.0–52.0)
Hemoglobin: 14.1 g/dL (ref 13.0–17.0)
MCH: 27.2 pg (ref 26.0–34.0)
MCHC: 31.7 g/dL (ref 30.0–36.0)
MCV: 85.9 fL (ref 80.0–100.0)
Platelets: 219 10*3/uL (ref 150–400)
RBC: 5.18 MIL/uL (ref 4.22–5.81)
RDW: 16.8 % — ABNORMAL HIGH (ref 11.5–15.5)
WBC: 7.7 10*3/uL (ref 4.0–10.5)
nRBC: 0 % (ref 0.0–0.2)

## 2023-01-15 LAB — PROTIME-INR
INR: 1.3 — ABNORMAL HIGH (ref 0.8–1.2)
Prothrombin Time: 16.3 seconds — ABNORMAL HIGH (ref 11.4–15.2)

## 2023-01-15 LAB — BASIC METABOLIC PANEL
Anion gap: 9 (ref 5–15)
BUN: 13 mg/dL (ref 8–23)
CO2: 20 mmol/L — ABNORMAL LOW (ref 22–32)
Calcium: 8.7 mg/dL — ABNORMAL LOW (ref 8.9–10.3)
Chloride: 106 mmol/L (ref 98–111)
Creatinine, Ser: 0.93 mg/dL (ref 0.61–1.24)
GFR, Estimated: >60 mL/min (ref >60)
Glucose, Bld: 154 mg/dL — ABNORMAL HIGH (ref 70–99)
Potassium: 4.2 mmol/L (ref 3.5–5.1)
Sodium: 135 mmol/L (ref 135–145)

## 2023-01-15 MED ORDER — ETOMIDATE 2 MG/ML IV SOLN
10.0000 mg | Freq: Once | INTRAVENOUS | Status: AC
  Administered 2023-01-15: 10 mg via INTRAVENOUS
  Filled 2023-01-15: qty 10

## 2023-01-15 NOTE — Telephone Encounter (Signed)
STAT if HR is under 50 or over 120 (normal HR is 60-100 beats per minute)  What is your heart rate? 151  Do you have a log of your heart rate readings (document readings)?   Do you have any other symptoms? Sweating

## 2023-01-15 NOTE — ED Triage Notes (Signed)
Pt sent from Afib clinic due to elevated HR. States a nurse came to his house yesterday and noted his HR was in 150s. Denies CP or SOB. Pt takes eliquis twice a day.

## 2023-01-15 NOTE — Progress Notes (Addendum)
Primary Care Physician: Travis Canavan, PA-C Referring Physician:  [name] is a 72 y.o. male with a h/o CAD, CVA, DM II,atrial  flutter/fib with ablations in 2010/2016. He is here today as a matter of a 2 month f/u from  Dr. Johney Peterson but states that he went out of rhythm since last Friday and not having power over the weekend, and not being able to use his CPAP, has contributed to him being out of rhythm since then. He usually will be out of rhythm  just a short period of time and will return on his on. EKG shows atrial flutter at 150 bpm and he is tolerating well so far. He has to go get a generator today as he still does not have power.He  continues on eliquis with a CHA2DS2VASc score of 7.  F/u in afib clinic, 12/06/20. I have not seen since 08/2020. He had return to afib with RVR and had a successful cardioversion in the ER 11/30/20. EKG now shows SR. He feels the start of a new job may have triggered it. 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 He does not drink alcohol or use  tobacco. He is religious to use CPAP. Moderate coffee consumption.   On follow up 01/15/23, he is currently in atrial flutter with RVR. He called this morning noting HR 151 for the past several weeks, left chest soreness, and sweating. He has been taking Lopressor 50 mg to lower HR. Wife notes his CPAP machine has not been working for the past several weeks and wondering if this could have been his trigger. He also had a viral illness ~2 weeks ago. He last took [facility] medication Monday 6/17 (Ozempic is in list but not taking this currently). He has not missed any doses of Eliquis.   Today, he denies symptoms of palpitations, chest pain, shortness of breath, orthopnea, PND, lower extremity edema, dizziness, presyncope, syncope, or neurologic sequela. The patient is tolerating medications without difficulties and is otherwise without complaint today.   Past Medical  History:  Diagnosis Date   Atrial flutter (HCC)    a. atypical atrial flutter.   Bell's palsy    CAD (coronary artery disease)    a. 07/2009 Cath: LM nl, LAD nl, LCX nl w/ L->R collats, chronically occluded RCA s/p failed PCI;  b. 07/2012 MV: basal inf mild ischemia.   Central retinal artery occlusion, left eye    diagnosed in 2008   Cerebrovascular accident [facility])    a. 03/2008 secondary to cardioembolic event;  b. chronic coumadin.   Chronic diastolic CHF (congestive heart failure) (HCC)    a. 12/2008 Echo: nl EF.   Diabetes mellitus 11/14/2010   TYPE II   Hematuria 05/2013   Urology, Dr. Lindley Peterson   Hyperlipidemia    Hypertension    Hypogonadism, male 05/2013   Dr. Lindley Peterson, Urology   Morbid obesity [name])    Obesity    Obstructive sleep apnea    Persistent atrial fibrillation (HCC)    a. s/p afib ablation by [name] 08/01/08   Recommendation refused by patient    multiple recommended vaccines refused over the years (flu/pneumovax)   Skin infection    chronic left leg herpetic   Past Surgical History:  Procedure Laterality Date   ATRIAL ABLATION SURGERY  08/01/08   CTI and PVI ablation by JA   COLONOSCOPY     2012 per patient   ELECTROPHYSIOLOGIC  STUDY N/A 11/30/2014   Procedure: Atrial Fibrillation Ablation;  Surgeon: Travis Range, MD;  Location: [name];  Service: Cardiovascular;  Laterality: N/A;   TEE WITHOUT CARDIOVERSION N/A 11/30/2014   Procedure: TRANSESOPHAGEAL ECHOCARDIOGRAM (TEE);  Surgeon: Travis Stade, MD;  Location: [name] ENDOSCOPY;  Service: Cardiovascular;  Laterality: N/A;    Current Outpatient Medications  Medication Sig Dispense Refill   Accu-Chek Softclix Lancets lancets Test 1-2 times daily 100 each 1   acetaminophen (TYLENOL) 500 MG tablet Take 500 mg by mouth as needed for mild pain or headache.     Alirocumab (PRALUENT) 75 MG/ML SOAJ Inject 75 mg into the skin every 14 (fourteen) days. 6 mL 4   amLODipine (NORVASC) 5 MG tablet Take 1 tablet (5 mg total)  by mouth daily. 90 tablet 3   apixaban (ELIQUIS) 5 MG TABS tablet Take 1 tablet by mouth twice daily 180 tablet 1   atorvastatin (LIPITOR) 80 MG tablet Take 1 tablet by mouth once daily 90 tablet 2   b complex vitamins capsule Take 1 capsule by mouth every other day.      benzonatate (TESSALON) 200 MG capsule Take 1 capsule (200 mg total) by mouth 3 (three) times daily as needed for cough. 20 capsule 0   Blood Glucose Monitoring Suppl (ACCU-CHEK GUIDE ME) w/Device KIT Test 1-2 times daily 1 kit 0   clobetasol (TEMOVATE) 0.05 % external solution Apply 1 application topically daily as needed (rash). (Patient not taking: Reported on 08/23/2022)     cyanocobalamin (VITAMIN B12) 1000 MCG/ML injection INJECT 1 ML  INTRAMUSCULARLY EVERY TWO WEEKS 2 mL 0   diazepam (VALIUM) 10 MG tablet Take 1 tablet (10 mg total) by mouth every 12 (twelve) hours as needed for anxiety. 12 tablet 0   fluticasone (FLONASE) 50 MCG/ACT nasal spray Place 1 spray into both nostrils daily. 16 g 0   glucose blood (ACCU-CHEK GUIDE) test strip USE 1 STRIP TO CHECK GLUCOSE ONCE TO TWICE DAILY 100 each 1   insulin degludec (TRESIBA FLEXTOUCH) 100 UNIT/ML FlexTouch Pen Inject 18 Units into the skin at bedtime. 15 mL 3   Insulin Pen Needle (BD PEN NEEDLE NANO 2ND GEN) 32G X 4 MM MISC USE  NEEDLES AT BEDTIME 100 each 1   isosorbide mononitrate (IMDUR) 30 MG 24 hr tablet Take 1 tablet (30 mg total) by mouth daily. 90 tablet 3   losartan (COZAAR) 100 MG tablet Take 1 tablet by mouth once daily 90 tablet 2   metoprolol tartrate (LOPRESSOR) 50 MG tablet TAKE 1 TABLET BY MOUTH TWICE DAILY MAY  TAKE  1  EXTRA  IF  HEART  RATE  IS  110  OR  HIGHER. 180 tablet 2   omega-3 acid ethyl esters (LOVAZA) 1 g capsule Take 2 capsules (2 g total) by mouth 2 (two) times daily. 360 capsule 3   oseltamivir (TAMIFLU) 75 MG capsule Take 1 capsule (75 mg total) by mouth 2 (two) times daily. 10 capsule 0   OZEMPIC, 2 MG/DOSE, 8 MG/3ML SOPN INJECT 2 MG  SUBCUTANEOUSLY ONCE A WEEK (Patient not taking: Reported on 08/23/2022) 9 mL 0   Semaglutide, 2 MG/DOSE, (OZEMPIC, 2 MG/DOSE,) 8 MG/3ML SOPN Inject 2 mg into the skin once a week. (Patient not taking: Reported on 08/23/2022) 9 mL 0   SYRINGE/NEEDLE, DISP, 1 ML (BD ECLIPSE SYRINGE) 25G X 5/8" 1 ML MISC Use as needed for b12 injections 50 each 5   testosterone (ANDROGEL) 50 MG/5GM (1%) GEL  APPLY 2 PACKETS TOPICALLY ONTO THE SKIN ONCE DAILY     tirzepatide (MOUNJARO) 7.5 MG/0.5ML Pen INJECT 7.5MG  SUBCUTANEOUSLY ONCE A WEEK 2 mL 0   valACYclovir (VALTREX) 500 MG tablet Take 1 tablet by mouth once daily 90 tablet 1   vitamin B-12 (CYANOCOBALAMIN) 1000 MCG tablet Take 1 tablet (1,000 mcg total) by mouth daily. 90 tablet 3   No current facility-administered medications for this visit.    Allergies  Allergen Reactions   Crestor [Rosuvastatin] Other (See Comments)    Made him feel crazy   Zetia [Ezetimibe] Other (See Comments)    Made him feel crazy   Lisinopril Cough   Farxiga [Dapagliflozin]     Made heart race   Metformin And Related     Mental fog    ROS- All systems are reviewed and negative except as per the HPI above  Physical Exam: There were no vitals filed for this visit. Wt Readings from Last 3 Encounters:  08/23/22 116.1 kg  07/20/22 120.7 kg  07/05/22 118.4 kg    Labs: [facility] Results  Component Value Date   NA 141 01/31/2022   K 5.0 01/31/2022   CL 102 01/31/2022   CO2 23 01/31/2022   GLUCOSE 108 (H) 01/31/2022   BUN 13 01/31/2022   CREATININE 0.90 01/31/2022   CALCIUM 9.9 01/31/2022   MG 2.1 11/30/2020   Peterson Results  Component Value Date   INR 2.8 10/13/2018   [facility]on Results  Component Value Date   CHOL 113 01/31/2022   HDL 48 01/31/2022   LDLCALC 38 01/31/2022   TRIG 162 (H) 01/31/2022    GEN- The patient is alert and oriented x 3 today.   Head- normocephalic, atraumatic Eyes-  Sclera clear, conjunctiva pink Ears- hearing intact Lungs- Clear to ausculation  bilaterally, normal work of breathing Heart- Tachycardic rate and rhythm, no murmurs, rubs or gallops, PMI not laterally displaced Extremities- no clubbing, cyanosis, or edema MS- no significant deformity or atrophy Skin- no rash or lesion Psych- euthymic mood, full affect Neuro- strength and sensation are intact   EKG: Atrial flutter with 2:1 conduction HR 150  Echo 11/18/2014-   - Left ventricle: The cavity size was normal. There was moderate    focal basal hypertrophy of the septum. Systolic function was    normal. The estimated ejection fraction was in the Peterson of 60%    to 65%. Wall motion was normal; there were no regional wall    motion abnormalities. Doppler parameters are consistent with    pseudonormal left ventricular relaxation (grade 2 diastolic    dysfunction). The E/A ratio is 1.8, the E/e&' ratio is >15,   suggesting elevated LV filling pressure.  - Aortic valve: Trileaflet; mildly calcified leaflets.  - Mitral valve: Calcified annulus. There was trivial regurgitation.  - Left atrium: The atrium was at the upper limits of normal in    size.  - Tricuspid valve: There was moderate regurgitation.  - Pulmonary arteries: PA peak pressure: 48 mm Hg (S).  - Inferior vena cava: The vessel was normal in size. The    respirophasic diameter changes were in the normal Peterson (= 50%),    consistent with normal central venous pressure.   Assessment and Plan: 1. Afib/flutter   He is currently in atrial flutter with RVR. Discussion with patient and wife on scheduling cardioversion versus going to ED for consideration of urgent cardioversion. After discussion, patient has agreed to go to ED for consideration of urgent cardioversion.  Last took [facility] 6/17.  2. CHA2DS2VASc score of 7 Continue Eliquis 5 mg BID - no missed doses.   3. OSA He is currently trying to get CPAP machine fixed / replaced.  F/u after ED DCCV.   Lake Bells, PA-C Afib Clinic [facility] [address] [place], [place] [address] [phone]

## 2023-01-15 NOTE — Patient Instructions (Signed)
No mounjaro until after cardioversion   Cardioversion scheduled for: Friday, June 28th   - Arrive at the [facility] and go to admitting at 11am   - Do not eat or drink anything after midnight the night prior to your procedure.   - Take all your morning medication (except diabetic medications) with a sip of water prior to arrival.  - You will not be able to drive home after your procedure.    - Do NOT miss any doses of your blood thinner - if you should miss a dose please notify our office immediately.   - If you feel as if you go back into normal rhythm prior to scheduled cardioversion, please notify our office immediately.   If your procedure is canceled in the cardioversion suite you will be charged a cancellation fee.    Hold below medications 7 days prior to scheduled procedure/anesthesia.  Restart medication on the normal dosing day after scheduled procedure/anesthesia  Dulaglutide (Trulicity) Exenatide extended release (Bydureon bcise) Semaglutide (Ozempic) (WEGOVY)  Tirzepatide Greggory Keen)       For those patients who have a scheduled procedure/anesthesia on the same day of the week as their dose, hold the medication on the day of surgery.  They can take their scheduled dose the week before.  **Patients on the above medications scheduled for elective procedures that have not held the medication for the appropriate amount of time are at risk of cancellation or change in the anesthetic plan.

## 2023-01-15 NOTE — Discharge Instructions (Addendum)
You were seen in the emergency department for atrial fibrillation.  You had cardioversion performed here which helped to convert your rhythm back into normal sinus rhythm.  I would advise following up with your cardiologist for further evaluation.  Please return to the emergency department if you have any acute worsening of your symptoms.

## 2023-01-15 NOTE — ED Notes (Signed)
Pt tolerated well.  Fully alert and states is comfortable.  Wife at bedside

## 2023-01-15 NOTE — Telephone Encounter (Signed)
Pt calling back for an update on this issue  

## 2023-01-15 NOTE — ED Provider Notes (Signed)
.  Sedation  Date/Time: 01/15/2023 3:00 PM  Performed by: Rolan Bucco, MD Authorized by: Rolan Bucco, MD   Consent:    Consent obtained:  Written   Consent given by:  Patient   Risks discussed:  Allergic reaction, dysrhythmia, inadequate sedation, respiratory compromise necessitating ventilatory assistance and intubation and vomiting   Alternatives discussed:  Analgesia without sedation Universal protocol:    Immediately prior to procedure, a time out was called: yes   Indications:    Procedure performed:  Cardioversion Pre-sedation assessment:    Time since last food or drink:  5 hours   ASA classification: class 3 - patient with severe systemic disease     Mouth opening:  3 or more finger widths   Thyromental distance:  3 finger widths   Mallampati score:  II - soft palate, uvula, fauces visible   Neck mobility: normal     Pre-sedation assessments completed and reviewed: airway patency, cardiovascular function, hydration status, mental status, nausea/vomiting, pain level, respiratory function and temperature     Pre-sedation assessment completed:  01/15/2023 1:00 PM Immediate pre-procedure details:    Reviewed: vital signs, relevant labs/tests and NPO status     Verified: bag valve mask available, emergency equipment available, intubation equipment available, IV patency confirmed, oxygen available and suction available   Procedure details (see MAR for exact dosages):    Preoxygenation:  Nasal cannula   Sedation:  Etomidate   Intended level of sedation: deep   Intra-procedure monitoring:  Blood pressure monitoring, cardiac monitor, continuous capnometry, continuous pulse oximetry, frequent LOC assessments and frequent vital sign checks   Intra-procedure events: none     Total Provider sedation time (minutes):  15 Post-procedure details:    Post-sedation assessment completed:  01/15/2023 2:45 PM   Attendance: Constant attendance by certified staff until patient recovered      Recovery: Patient returned to pre-procedure baseline     Post-sedation assessments completed and reviewed: airway patency, cardiovascular function, hydration status, mental status, nausea/vomiting, pain level, respiratory function and temperature     Patient is stable for discharge or admission: yes     Procedure completion:  Tolerated well, no immediate complications     Rolan Bucco, MD 01/15/23 1503

## 2023-01-15 NOTE — Telephone Encounter (Signed)
Spoke to the pt, c/o of HR 151,(1-2 weeks)  chest side left soreness ( for 1-2 weeks) and waking up  with morning sweats for the past 1-2 weeks. Pt does take Metoprolol tartrate 50 mg    metoprolol tartrate (LOPRESSOR) 50 MG tablet Take 1 tablet by mouth twice daily (May take 1 extra tablet if heart rate is 110 or higher)   Pt stated yesterday he did take a total of 3 tablets of Lopressor 50 mg however, this did not lower his HR. Pt does have a history of A-fib, will forward to A-fib clinic for advise. Explained ED precautions, pt voiced understanding.

## 2023-01-15 NOTE — Telephone Encounter (Signed)
Appt made

## 2023-01-15 NOTE — ED Provider Notes (Signed)
[facility]GENC[facility] DEPARTMENT AT [facility] Provider Note   CSN: [id number] Arrival date & time: 01/15/23  1201     History Chief Complaint  Patient presents with   Atrial Fibrillation    [name] is a 72 y.o. male.  Patient presents to the emergency department with concerns of atrial fibrillation.  Patient was seen in A-fib clinic earlier today and was given the option of having a scheduled cardioversion performed by the cardiology group but decided to come to the emergency department for cardioversion here.   Atrial Fibrillation       Home Medications Prior to Admission medications   Medication Sig Start Date End Date Taking? Authorizing Provider  Accu-Chek Softclix Lancets lancets Test 1-2 times daily 07/10/21   Tysinger, Kermit Balo, PA-C  acetaminophen (TYLENOL) 500 MG tablet Take 500 mg by mouth as needed for mild pain or headache.    [provider]  Alirocumab (PRALUENT) 75 MG/ML SOAJ Inject 75 mg into the skin every 14 (fourteen) days. 02/01/22   Tysinger, Kermit Balo, PA-C  amLODipine (NORVASC) 5 MG tablet Take 1 tablet (5 mg total) by mouth daily. 10/01/22   Quintella Reichert, MD  apixaban (ELIQUIS) 5 MG TABS tablet Take 1 tablet by mouth twice daily 10/01/22   Quintella Reichert, MD  Ascorbic Acid (VITAMIN C PO) Take 1 tablet by mouth every morning.    [provider]  atorvastatin (LIPITOR) 80 MG tablet Take 1 tablet by mouth once daily 10/16/22   Quintella Reichert, MD  b complex vitamins capsule Take 1 capsule by mouth every other day.     [provider]  Blood Glucose Monitoring Suppl (ACCU-CHEK GUIDE ME) w/Device KIT Test 1-2 times daily 07/10/21   Tysinger, Kermit Balo, PA-C  cyanocobalamin (VITAMIN B12) 1000 MCG/ML injection INJECT 1 ML  INTRAMUSCULARLY EVERY TWO WEEKS 12/28/22   Tysinger, Kermit Balo, PA-C  fluticasone (FLONASE) 50 MCG/ACT nasal spray Place 1 spray into both nostrils daily. 01/06/23   Raspet, Erin K, PA-C  glucose blood  (ACCU-CHEK GUIDE) test strip USE 1 STRIP TO CHECK GLUCOSE ONCE TO TWICE DAILY 11/19/22   Tysinger, Kermit Balo, PA-C  insulin degludec (TRESIBA FLEXTOUCH) 100 UNIT/ML FlexTouch Pen Inject 18 Units into the skin at bedtime. 02/01/22   Tysinger, Kermit Balo, PA-C  Insulin Pen Needle (BD PEN NEEDLE NANO 2ND GEN) 32G X 4 MM MISC USE  NEEDLES AT BEDTIME 09/03/22   Tysinger, Kermit Balo, PA-C  isosorbide mononitrate (IMDUR) 30 MG 24 hr tablet Take 1 tablet (30 mg total) by mouth daily. 09/10/22   Quintella Reichert, MD  losartan (COZAAR) 100 MG tablet Take 1 tablet by mouth once daily 10/16/22   Quintella Reichert, MD  metoprolol tartrate (LOPRESSOR) 50 MG tablet TAKE 1 TABLET BY MOUTH TWICE DAILY MAY  TAKE  1  EXTRA  IF  HEART  RATE  IS  110  OR  HIGHER. 10/04/22   Quintella Reichert, MD  NIACIN PO Take 1 tablet by mouth daily.    [provider]  omega-3 acid ethyl esters (LOVAZA) 1 g capsule Take 2 capsules (2 g total) by mouth 2 (two) times daily. 02/01/22   Tysinger, Kermit Balo, PA-C  OZEMPIC, 2 MG/DOSE, 8 MG/3ML SOPN INJECT 2 MG SUBCUTANEOUSLY ONCE A WEEK Patient not taking: Reported on 01/15/2023 06/11/22   Tysinger, Kermit Balo, PA-C  Semaglutide, 2 MG/DOSE, (OZEMPIC, 2 MG/DOSE,) 8 MG/3ML SOPN Inject 2 mg into the skin once a  week. Patient not taking: Reported on 08/23/2022 02/28/22   Tysinger, Kermit Balo, PA-C  SYRINGE/NEEDLE, DISP, 1 ML (BD ECLIPSE SYRINGE) 25G X 5/8" 1 ML MISC Use as needed for b12 injections 02/01/22   Tysinger, Kermit Balo, PA-C  testosterone (ANDROGEL) 50 MG/5GM (1%) GEL APPLY 2 PACKETS TOPICALLY ONTO THE SKIN ONCE DAILY 10/22/20   [provider]  tirzepatide Greggory Keen) 7.5 MG/0.5ML Pen INJECT 7.5MG  SUBCUTANEOUSLY ONCE A WEEK 12/28/22   Tysinger, Kermit Balo, PA-C  valACYclovir (VALTREX) 500 MG tablet Take 1 tablet by mouth once daily 08/30/22   Tysinger, Kermit Balo, PA-C  vitamin B-12 (CYANOCOBALAMIN) 1000 MCG tablet Take 1 tablet (1,000 mcg total) by mouth daily. 02/01/22   Tysinger, Kermit Balo, PA-C  VITAMIN D PO  Take 1 tablet by mouth daily.    [provider]      Allergies    Crestor [rosuvastatin], Zetia [ezetimibe], Lisinopril, Farxiga [dapagliflozin], and Metformin and related    Review of Systems   Review of Systems  Physical Exam Updated Vital Signs BP 116/83   Pulse (!) 150   Temp 97.7 F (36.5 C) (Oral)   Resp (!) 22   Ht 5\' 10"  (1.778 m)   Wt 121.9 kg   SpO2 93%   BMI 38.57 kg/m  Physical Exam  ED Results / Procedures / Treatments   Labs (all labs ordered are listed, but only abnormal results are displayed) Labs Reviewed  BASIC METABOLIC PANEL - Abnormal; Notable for the following components:      Result Value   CO2 20 (*)    Glucose, Bld 154 (*)    Calcium 8.7 (*)    All other components within normal limits  CBC - Abnormal; Notable for the following components:   RDW 16.8 (*)    All other components within normal limits  PROTIME-INR - Abnormal; Notable for the following components:   Prothrombin Time 16.3 (*)    INR 1.3 (*)    All other components within normal limits    EKG None  Radiology DG Chest Port 1 View  Result Date: 01/15/2023 CLINICAL DATA:  Elevated heart rate.  History of AFib EXAM: PORTABLE CHEST 1 VIEW COMPARISON:  X-ray 11/30/2020 FINDINGS: Hyperinflation. Minimal linear opacity left lung base likely scar or atelectasis. Normal cardiopericardial silhouette with some prominence of the central vasculature. Overlapping cardiac leads. Films are under penetrated. IMPRESSION: Hyperinflation. Slight left basilar atelectasis. Prominent central vasculature. Electronically Signed   By: Karen Kays M.D.   On: 01/15/2023 13:35    Procedures .Cardioversion  Date/Time: 01/15/2023 3:00 PM  Performed by: Smitty Knudsen, PA-C Authorized by: Smitty Knudsen, PA-C   Consent:    Consent obtained:  Verbal   Consent given by:  Patient   Risks discussed:  Cutaneous burn, death, induced arrhythmia and pain   Alternatives discussed:  Rate-control  medication and alternative treatment Pre-procedure details:    Cardioversion basis:  Emergent   Rhythm:  Atrial fibrillation   Electrode placement:  Anterior-posterior Patient sedated: Yes. Refer to sedation procedure documentation for details of sedation.  Attempt one:    Cardioversion mode:  Synchronous   Waveform:  Monophasic   Shock (Joules):  200   Shock outcome:  Conversion to normal sinus rhythm Post-procedure details:    Patient status:  Awake   Patient tolerance of procedure:  Tolerated well, no immediate complications    Medications Ordered in ED Medications  etomidate (AMIDATE) injection 10 mg (10 mg Intravenous Given 01/15/23 1405)  ED Course/ Medical Decision Making/ A&P                           Medical Decision Making Amount and/or Complexity of Data Reviewed Labs: ordered. Radiology: ordered.  Risk Prescription drug management.   This patient presents to the ED for concern of *** differential diagnosis includes ***    Additional history obtained:  Additional history obtained from *** External records from outside source obtained and reviewed including ***   Lab Tests:  I Ordered, and personally interpreted labs.  The pertinent results include:  ***   Imaging Studies ordered:  I ordered imaging studies including ***  I independently visualized and interpreted imaging which showed *** I agree with the radiologist interpretation   Medicines ordered and prescription drug management:  I ordered medication including ***  for ***  Reevaluation of the patient after these medicines showed that the patient {resolved/improved/worsened:23923::"improved"} I have reviewed the patients home medicines and have made adjustments as needed   Problem List / ED Course:  ***   Social Determinants of Health:    Final Clinical Impression(s) / ED Diagnoses Final diagnoses:  None    Rx / DC Orders ED Discharge Orders     None

## 2023-01-16 NOTE — Telephone Encounter (Signed)
Wife states that no one has called them back (wife states this is her 3 time calling). She states she called the company, the told her the patient would need a new machine.  Wife is calling to get a prescription for new machine. Please advise

## 2023-01-16 NOTE — Telephone Encounter (Signed)
Spoke to patient who states he is concerned his cpap machine is not working and it is affecting his afib. Last OV with Dr. Mayford Knife was 06/2022.   01/06/23 Patient was admitted to [facility] ER for URI symptoms. 01/09/23 Patient started getting an error message on his cpap machine "motor life extended, call provider". He called DME company and they said to call provider and get a new prescription for a new machine.   01/15/23 Patient admitted to [facility] ER for afib, was cardioverted. Patient has continued to use cpap but is fearful it is not actually working or that it will cease working entirely any day  now.  Next visit w/ Dr. Mayford Knife is due 06/2023. Patient requests script for new cpap machine.

## 2023-01-18 ENCOUNTER — Ambulatory Visit (HOSPITAL_COMMUNITY): Admit: 2023-01-18 | Payer: 59 | Admitting: Cardiology

## 2023-01-18 ENCOUNTER — Encounter (HOSPITAL_COMMUNITY): Payer: Self-pay

## 2023-01-18 SURGERY — CARDIOVERSION
Anesthesia: Monitor Anesthesia Care

## 2023-01-18 NOTE — Telephone Encounter (Signed)
Travis Reichert, MD  Luellen Pucker, RN; Reesa Chew, CMA Caller: Unspecified (4 days ago, 10:58 AM) Order new ResMed CPAP at 10cm H2O with heated humidity and mask of choice.  Please let him know that it is ok to continue to use his CPAP that he has right now until he gets his new one.  The end of motor means that the motor will run out eventually but he is still fine to use it  Returned call to patient and explained above comments. He asks about a product called SoClean. Quick google search shows that Resmed particularly, recommends against using this as it can disintegrate the silicone seal of CPAP. Pt states he hasn't used it but was considering, and now won't. Will await coorespondance from [facility] or [facility] on new CPAP.

## 2023-01-18 NOTE — Telephone Encounter (Signed)
Order placed to [facility] via community message for new ResMed CPAP at 10cm H2O with heated humidity and mask of choice.

## 2023-01-18 NOTE — Addendum Note (Signed)
Addended by: Reesa Chew on: 01/18/2023 02:49 PM   Modules accepted: Orders

## 2023-01-19 ENCOUNTER — Other Ambulatory Visit: Payer: Self-pay | Admitting: Medical

## 2023-01-20 ENCOUNTER — Other Ambulatory Visit: Payer: Self-pay | Admitting: Medical

## 2023-01-21 DIAGNOSIS — G4733 Obstructive sleep apnea (adult) (pediatric): Secondary | ICD-10-CM | POA: Diagnosis not present

## 2023-01-21 DIAGNOSIS — I10 Essential (primary) hypertension: Secondary | ICD-10-CM | POA: Diagnosis not present

## 2023-01-21 NOTE — Telephone Encounter (Signed)
Pt has an appt July 30th

## 2023-01-23 ENCOUNTER — Encounter (HOSPITAL_COMMUNITY): Payer: Self-pay | Admitting: Internal Medicine

## 2023-01-23 ENCOUNTER — Ambulatory Visit (HOSPITAL_COMMUNITY)
Admission: RE | Admit: 2023-01-23 | Discharge: 2023-01-23 | Disposition: A | Discharge status: Home / Self Care | Payer: 59 | Source: Ambulatory Visit | Attending: Internal Medicine | Admitting: Internal Medicine

## 2023-01-23 VITALS — BP 136/68 | HR 81 | Ht 70.0 in | Wt 267.4 lb

## 2023-01-23 DIAGNOSIS — Z7901 Long term (current) use of anticoagulants: Secondary | ICD-10-CM | POA: Insufficient documentation

## 2023-01-23 DIAGNOSIS — I4819 Other persistent atrial fibrillation: Secondary | ICD-10-CM | POA: Diagnosis not present

## 2023-01-23 DIAGNOSIS — G4733 Obstructive sleep apnea (adult) (pediatric): Secondary | ICD-10-CM | POA: Insufficient documentation

## 2023-01-23 DIAGNOSIS — Z8673 Personal history of transient ischemic attack (TIA), and cerebral infarction without residual deficits: Secondary | ICD-10-CM | POA: Insufficient documentation

## 2023-01-23 DIAGNOSIS — I484 Atypical atrial flutter: Secondary | ICD-10-CM | POA: Insufficient documentation

## 2023-01-23 DIAGNOSIS — I2510 Atherosclerotic heart disease of native coronary artery without angina pectoris: Secondary | ICD-10-CM | POA: Diagnosis not present

## 2023-01-23 DIAGNOSIS — D6869 Other thrombophilia: Secondary | ICD-10-CM

## 2023-01-23 DIAGNOSIS — I4892 Unspecified atrial flutter: Secondary | ICD-10-CM | POA: Diagnosis not present

## 2023-01-23 DIAGNOSIS — E119 Type 2 diabetes mellitus without complications: Secondary | ICD-10-CM | POA: Insufficient documentation

## 2023-01-23 NOTE — Progress Notes (Signed)
Primary Care Physician: Jac Canavan, PA-C Referring Physician:  Sachin Gassner is a 72 y.o. male with a h/o CAD, CVA, DM II,atrial  flutter/fib with ablations in 2010/2016. He  continues on eliquis with a CHA2DS2VASc score of 7.  F/u in afib clinic, 12/06/20. I have not seen since 08/2020. He had return to afib with RVR and had a successful cardioversion in the ER 11/30/20. EKG now shows SR. He feels the start of a new job may have triggered it. 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 He does not drink alcohol or use  tobacco. He is religious to use CPAP. Moderate coffee consumption.   On follow up 01/15/23, he is currently in atrial flutter with RVR. He called this morning noting HR 151 for the past several weeks, left chest soreness, and sweating. He has been taking Lopressor 50 mg to lower HR. Wife notes his CPAP machine has not been working for the past several weeks and wondering if this could have been his trigger. He also had a viral illness ~2 weeks ago. He last took [facility] medication Monday 6/17 (Ozempic is in list but not taking this currently). He has not missed any doses of Eliquis.   On follow up 01/23/23, he is currently in NSR. He went to the ED on 6/25 directly from our OV due to being in atrial flutter with RVR and pre-syncopal. He underwent successful urgent DCCV on 01/15/23 with conversion to NSR. He feels well since cardioversion. No missed doses of Eliquis. He did purchase updated BP cuff to monitor his BP and HR at home.  Today, he denies symptoms of palpitations, chest pain, shortness of breath, orthopnea, PND, lower extremity edema, dizziness, presyncope, syncope, or neurologic sequela. The patient is tolerating medications without difficulties and is otherwise without complaint today.   Past Medical History:  Diagnosis Date   Atrial flutter (HCC)    a. atypical atrial flutter.   Bell's palsy    CAD (coronary artery  disease)    a. 07/2009 Cath: LM nl, LAD nl, LCX nl w/ L->R collats, chronically occluded RCA s/p failed PCI;  b. 07/2012 MV: basal inf mild ischemia.   Central retinal artery occlusion, left eye    diagnosed in 2008   Cerebrovascular accident [facility])    a. 03/2008 secondary to cardioembolic event;  b. chronic coumadin.   Chronic diastolic CHF (congestive heart failure) (HCC)    a. 12/2008 Echo: nl EF.   Diabetes mellitus 11/14/2010   TYPE II   Hematuria 05/2013   Urology, Dr. Lindley Magnus   Hyperlipidemia    Hypertension    Hypogonadism, male 05/2013   Dr. Lindley Magnus, Urology   Morbid obesity [facility])    Obesity    Obstructive sleep apnea    Persistent atrial fibrillation (HCC)    a. s/p afib ablation by [facility] 08/01/08   Recommendation refused by patient    multiple recommended vaccines refused over the years (flu/pneumovax)   Skin infection    chronic left leg herpetic   Past Surgical History:  Procedure Laterality Date   ATRIAL ABLATION SURGERY  08/01/08   CTI and PVI ablation by [facility]   COLONOSCOPY     2012 per patient   ELECTROPHYSIOLOGIC STUDY N/A 11/30/2014   Procedure: Atrial Fibrillation Ablation;  Surgeon: Hillis Range, MD;  Location: [facility] INVASIVE CV LAB;  Service: Cardiovascular;  Laterality: N/A;   TEE WITHOUT CARDIOVERSION N/A 11/30/2014  Procedure: TRANSESOPHAGEAL ECHOCARDIOGRAM (TEE);  Surgeon: Wendall Stade, MD;  Location: [facility] ENDOSCOPY;  Service: Cardiovascular;  Laterality: N/A;    Current Outpatient Medications  Medication Sig Dispense Refill   Accu-Chek Softclix Lancets lancets Test 1-2 times daily 100 each 1   acetaminophen (TYLENOL) 500 MG tablet Take 500 mg by mouth as needed for mild pain or headache.     Alirocumab (PRALUENT) 75 MG/ML SOAJ Inject 75 mg into the skin every 14 (fourteen) days. 6 mL 4   amLODipine (NORVASC) 5 MG tablet Take 1 tablet (5 mg total) by mouth daily. 90 tablet 3   apixaban (ELIQUIS) 5 MG TABS tablet Take 1 tablet by mouth twice daily 180 tablet 1    Ascorbic Acid (VITAMIN C PO) Take 1 tablet by mouth every morning.     atorvastatin (LIPITOR) 80 MG tablet Take 1 tablet by mouth once daily 90 tablet 2   b complex vitamins capsule Take 1 capsule by mouth every other day.      Blood Glucose Monitoring Suppl (ACCU-CHEK GUIDE ME) w/Device KIT Test 1-2 times daily 1 kit 0   cyanocobalamin (VITAMIN B12) 1000 MCG/ML injection INJECT 1 ML INTRAMUSCULARLY EVERY TWO WEEKS 2 mL 0   fluticasone (FLONASE) 50 MCG/ACT nasal spray Place 1 spray into both nostrils daily. 16 g 0   glucose blood (ACCU-CHEK GUIDE) test strip USE 1 STRIP TO CHECK GLUCOSE ONCE TO TWICE DAILY 100 each 1   insulin degludec (TRESIBA FLEXTOUCH) 100 UNIT/ML FlexTouch Pen Inject 18 Units into the skin at bedtime. 15 mL 3   Insulin Pen Needle (BD PEN NEEDLE NANO 2ND GEN) 32G X 4 MM MISC USE  NEEDLES AT BEDTIME 100 each 1   isosorbide mononitrate (IMDUR) 30 MG 24 hr tablet Take 1 tablet (30 mg total) by mouth daily. 90 tablet 3   losartan (COZAAR) 100 MG tablet Take 1 tablet by mouth once daily 90 tablet 2   metoprolol tartrate (LOPRESSOR) 50 MG tablet TAKE 1 TABLET BY MOUTH TWICE DAILY MAY  TAKE  1  EXTRA  IF  HEART  RATE  IS  110  OR  HIGHER. 180 tablet 2   NIACIN PO Take 1 tablet by mouth daily.     omega-3 acid ethyl esters (LOVAZA) 1 g capsule Take 2 capsules (2 g total) by mouth 2 (two) times daily. 360 capsule 3   OZEMPIC, 2 MG/DOSE, 8 MG/3ML SOPN INJECT 2 MG SUBCUTANEOUSLY ONCE A WEEK (Patient not taking: Reported on 01/15/2023) 9 mL 0   Semaglutide, 2 MG/DOSE, (OZEMPIC, 2 MG/DOSE,) 8 MG/3ML SOPN Inject 2 mg into the skin once a week. (Patient not taking: Reported on 08/23/2022) 9 mL 0   SYRINGE/NEEDLE, DISP, 1 ML (BD ECLIPSE SYRINGE) 25G X 5/8" 1 ML MISC Use as needed for b12 injections 50 each 5   testosterone (ANDROGEL) 50 MG/5GM (1%) GEL APPLY 2 PACKETS TOPICALLY ONTO THE SKIN ONCE DAILY     tirzepatide (MOUNJARO) 7.5 MG/0.5ML Pen INJECT 7.5MG  SUBCUTANEOUSLY ONCE A WEEK 2 mL 0    valACYclovir (VALTREX) 500 MG tablet Take 1 tablet by mouth once daily 90 tablet 1   vitamin B-12 (CYANOCOBALAMIN) 1000 MCG tablet Take 1 tablet (1,000 mcg total) by mouth daily. 90 tablet 3   VITAMIN D PO Take 1 tablet by mouth daily.     No current facility-administered medications for this visit.    Allergies  Allergen Reactions   Crestor [Rosuvastatin] Other (See Comments)    Made him feel  crazy   Zetia [Ezetimibe] Other (See Comments)    Made him feel crazy   Lisinopril Cough   Farxiga [Dapagliflozin]     Made heart race   Metformin And Related     Mental fog    ROS- All systems are reviewed and negative except as per the HPI above  Physical Exam: There were no vitals filed for this visit. Wt Readings from Last 3 Encounters:  01/15/23 121.9 kg  01/15/23 121.9 kg  08/23/22 116.1 kg    Labs: Lab Results  Component Value Date   NA 135 01/15/2023   K 4.2 01/15/2023   CL 106 01/15/2023   CO2 20 (L) 01/15/2023   GLUCOSE 154 (H) 01/15/2023   BUN 13 01/15/2023   CREATININE 0.93 01/15/2023   CALCIUM 8.7 (L) 01/15/2023   MG 2.1 11/30/2020   Lab Results  Component Value Date   INR 1.3 (H) 01/15/2023   Lab Results  Component Value Date   CHOL 113 01/31/2022   HDL 48 01/31/2022   LDLCALC 38 01/31/2022   TRIG 162 (H) 01/31/2022    GEN- The patient is well appearing, alert and oriented x 3 today.   Neck - no JVD or carotid bruit noted Lungs- Clear to ausculation bilaterally, normal work of breathing Heart- Regular rate and rhythm, no murmurs, rubs or gallops, PMI not laterally displaced Extremities- no clubbing, cyanosis, or edema Skin - no rash or ecchymosis noted  EKG: Vent. rate 81 BPM PR interval 180 ms QRS duration 84 ms QT/QTcB 378/439 ms P-R-T axes 60 -5 -3 Normal sinus rhythm Anteroseptal infarct , age undetermined Abnormal ECG When compared with ECG of 15-Jan-2023 12:07, PREVIOUS ECG IS PRESENT  Echo 11/18/2014-   - Left ventricle: The cavity  size was normal. There was moderate    focal basal hypertrophy of the septum. Systolic function was    normal. The estimated ejection fraction was in the range of 60%    to 65%. Wall motion was normal; there were no regional wall    motion abnormalities. Doppler parameters are consistent with    pseudonormal left ventricular relaxation (grade 2 diastolic    dysfunction). The E/A ratio is 1.8, the E/e&' ratio is >15,   suggesting elevated LV filling pressure.  - Aortic valve: Trileaflet; mildly calcified leaflets.  - Mitral valve: Calcified annulus. There was trivial regurgitation.  - Left atrium: The atrium was at the upper limits of normal in    size.  - Tricuspid valve: There was moderate regurgitation.  - Pulmonary arteries: PA peak pressure: 48 mm Hg (S).  - Inferior vena cava: The vessel was normal in size. The    respirophasic diameter changes were in the normal range (= 50%),    consistent with normal central venous pressure.   Assessment and Plan: 1. Afib/flutter  S/p DCCV in ED on 01/15/23 with successful conversion to NSR.  He is currently in NSR. Will continue with conservative observation for now. Plan to f/u in 3 months to reassess burden.  2. CHA2DS2VASc score of 7 Continue Eliquis 5 mg BID - no missed doses.   3. OSA He is currently trying to get CPAP machine fixed / replaced.  F/u 3 months.  Lake Bells, PA-C [facility] [address] [place], [place] [address] [phone]

## 2023-02-01 ENCOUNTER — Telehealth: Payer: Self-pay | Admitting: Cardiology

## 2023-02-01 NOTE — Telephone Encounter (Signed)
Pt is requesting a callback regarding him needing a new cpap machine but was told he has to have another Sleep study done first. Please advise

## 2023-02-04 NOTE — Telephone Encounter (Signed)
Patient was notified by [facility] that he needs a new sleep study because his original study cannot be located. Sent message to provider for new order for sleep study.

## 2023-02-06 NOTE — Telephone Encounter (Signed)
Left call back number on VM for patient to return call for sleep recommendations.

## 2023-02-13 ENCOUNTER — Telehealth: Payer: Self-pay | Admitting: Cardiology

## 2023-02-13 NOTE — Telephone Encounter (Signed)
Pt calling back regarding trying to get ahold of [facility] for his sleep machine. Pt states he has been trying for weeks. Please advise.

## 2023-02-17 ENCOUNTER — Other Ambulatory Visit: Payer: Self-pay | Admitting: Medical

## 2023-02-19 ENCOUNTER — Encounter: Payer: Self-pay | Admitting: Medical

## 2023-02-19 ENCOUNTER — Ambulatory Visit (INDEPENDENT_AMBULATORY_CARE_PROVIDER_SITE_OTHER): Payer: 59 | Admitting: Medical

## 2023-02-19 VITALS — BP 110/60 | HR 72 | Ht 70.0 in | Wt 264.0 lb

## 2023-02-19 DIAGNOSIS — E785 Hyperlipidemia, unspecified: Secondary | ICD-10-CM | POA: Diagnosis not present

## 2023-02-19 DIAGNOSIS — I2583 Coronary atherosclerosis due to lipid rich plaque: Secondary | ICD-10-CM | POA: Diagnosis not present

## 2023-02-19 DIAGNOSIS — Z7901 Long term (current) use of anticoagulants: Secondary | ICD-10-CM | POA: Diagnosis not present

## 2023-02-19 DIAGNOSIS — I2510 Atherosclerotic heart disease of native coronary artery without angina pectoris: Secondary | ICD-10-CM | POA: Diagnosis not present

## 2023-02-19 DIAGNOSIS — Z23 Encounter for immunization: Secondary | ICD-10-CM

## 2023-02-19 DIAGNOSIS — Z7185 Encounter for immunization safety counseling: Secondary | ICD-10-CM | POA: Diagnosis not present

## 2023-02-19 DIAGNOSIS — E1169 Type 2 diabetes mellitus with other specified complication: Secondary | ICD-10-CM

## 2023-02-19 DIAGNOSIS — Z2820 Immunization not carried out because of patient decision for unspecified reason: Secondary | ICD-10-CM | POA: Diagnosis not present

## 2023-02-19 DIAGNOSIS — I4819 Other persistent atrial fibrillation: Secondary | ICD-10-CM | POA: Diagnosis not present

## 2023-02-19 DIAGNOSIS — Z8679 Personal history of other diseases of the circulatory system: Secondary | ICD-10-CM

## 2023-02-19 DIAGNOSIS — D6869 Other thrombophilia: Secondary | ICD-10-CM | POA: Diagnosis not present

## 2023-02-19 DIAGNOSIS — E118 Type 2 diabetes mellitus with unspecified complications: Secondary | ICD-10-CM | POA: Diagnosis not present

## 2023-02-19 MED ORDER — TIRZEPATIDE 12.5 MG/0.5ML ~~LOC~~ SOAJ: 12.5000 mg | SUBCUTANEOUS | 0 refills | Status: DC

## 2023-02-19 MED ORDER — TIRZEPATIDE 10 MG/0.5ML ~~LOC~~ SOAJ: 10.0000 mg | SUBCUTANEOUS | 0 refills | Status: DC

## 2023-02-19 NOTE — Progress Notes (Signed)
Subjective:  [name] is a 72 y.o. male who presents for Chief Complaint  Patient presents with   Diabetes    Patient here for diabetic med check, he is fasting. Had last diabetic eye exam 10/23, next one 10/24 with Dr. Alan Mulder [facility]. He feesl like ozempic worked better than the [employer] for control of sugar and weight.      Here for chronic disease follow up.  Diabetes-compliant with [place] 18 units daily, Mounjaro pen.  No recent concerns.  No low readings.  He does not feel he lost any weight low with the current dose.  Hyperlipidemia-currently compliant with Praluent injection and atorvastatin 80 mg daily.  He has had intolerance to other statins in the past.  Having some issues with needing a new CPAP device.  His CPAP device is old and it does not cut off correctly.  He called home health but was told he needed an updated sleep study since his sleep study was greater than 15 years old.  Compliant with other medications.  He is currently Education officer, environmental and full-time up in [place]  No other aggravating or relieving factors.    No other c/o.  Past Medical History:  Diagnosis Date   Atrial flutter (HCC)    a. atypical atrial flutter.   Bell's palsy    CAD (coronary artery disease)    a. 07/2009 Cath: LM nl, LAD nl, LCX nl w/ L->R collats, chronically occluded RCA s/p failed PCI;  b. 07/2012 MV: basal inf mild ischemia.   Central retinal artery occlusion, left eye    diagnosed in 2008   Cerebrovascular accident [facility])    a. 03/2008 secondary to cardioembolic event;  b. chronic coumadin.   Chronic diastolic CHF (congestive heart failure) (HCC)    a. 12/2008 Echo: nl EF.   Diabetes mellitus 11/14/2010   TYPE II   Hematuria 05/2013   Urology, Dr. Lindley Magnus   Hyperlipidemia    Hypertension    Hypogonadism, male 05/2013   Dr. Lindley Magnus, Urology   Morbid obesity [facility])    Obesity    Obstructive sleep apnea    Persistent atrial fibrillation [facility])    a. s/p afib  ablation by [facility] 08/01/08   Recommendation refused by patient    multiple recommended vaccines refused over the years (flu/pneumovax)   Skin infection    chronic left leg herpetic   Current Outpatient Medications on File Prior to Visit  Medication Sig Dispense Refill   Accu-Chek Softclix Lancets lancets Test 1-2 times daily 100 each 1   Alirocumab (PRALUENT) 75 MG/ML SOAJ Inject 75 mg into the skin every 14 (fourteen) days. 6 mL 4   amLODipine (NORVASC) 5 MG tablet Take 1 tablet (5 mg total) by mouth daily. 90 tablet 3   apixaban (ELIQUIS) 5 MG TABS tablet Take 1 tablet by mouth twice daily 180 tablet 1   Ascorbic Acid (VITAMIN C PO) Take 1 tablet by mouth every morning.     atorvastatin (LIPITOR) 80 MG tablet Take 1 tablet by mouth once daily 90 tablet 2   b complex vitamins capsule Take 1 capsule by mouth every other day.      Blood Glucose Monitoring Suppl (ACCU-CHEK GUIDE ME) w/Device KIT Test 1-2 times daily 1 kit 0   cyanocobalamin (VITAMIN B12) 1000 MCG/ML injection INJECT 1 ML INTRAMUSCULARLY EVERY TWO WEEKS 2 mL 0   glucose blood (ACCU-CHEK GUIDE) test strip USE 1 STRIP TO CHECK GLUCOSE ONCE TO TWICE DAILY 100 each 1  insulin degludec (TRESIBA FLEXTOUCH) 100 UNIT/ML FlexTouch Pen Inject 18 Units into the skin at bedtime. 15 mL 3   Insulin Pen Needle (BD PEN NEEDLE NANO 2ND GEN) 32G X 4 MM MISC USE  NEEDLES AT BEDTIME 100 each 1   isosorbide mononitrate (IMDUR) 30 MG 24 hr tablet Take 1 tablet (30 mg total) by mouth daily. 90 tablet 3   losartan (COZAAR) 100 MG tablet Take 1 tablet by mouth once daily 90 tablet 2   metoprolol tartrate (LOPRESSOR) 50 MG tablet TAKE 1 TABLET BY MOUTH TWICE DAILY MAY  TAKE  1  EXTRA  IF  HEART  RATE  IS  110  OR  HIGHER. 180 tablet 2   NIACIN PO Take 1 tablet by mouth daily.     omega-3 acid ethyl esters (LOVAZA) 1 g capsule Take 2 capsules (2 g total) by mouth 2 (two) times daily. 360 capsule 3   SYRINGE/NEEDLE, DISP, 1 ML (BD ECLIPSE SYRINGE) 25G X 5/8"  1 ML MISC Use as needed for b12 injections 50 each 5   testosterone (ANDROGEL) 50 MG/5GM (1%) GEL APPLY 2 PACKETS TOPICALLY ONTO THE SKIN ONCE DAILY     tirzepatide (MOUNJARO) 7.5 MG/0.5ML Pen INJECT 7.5MG  SUBCUTANEOUSLY ONCE A WEEK 2 mL 0   valACYclovir (VALTREX) 500 MG tablet Take 1 tablet by mouth once daily 90 tablet 1   vitamin B-12 (CYANOCOBALAMIN) 1000 MCG tablet Take 1 tablet (1,000 mcg total) by mouth daily. 90 tablet 3   VITAMIN D PO Take 1 tablet by mouth daily.     acetaminophen (TYLENOL) 500 MG tablet Take 500 mg by mouth as needed for mild pain or headache. (Patient not taking: Reported on 02/19/2023)     fluticasone (FLONASE) 50 MCG/ACT nasal spray Place 1 spray into both nostrils daily. (Patient not taking: Reported on 02/19/2023) 16 g 0   No current facility-administered medications on file prior to visit.     The following portions of the patient's history were reviewed and updated as appropriate: allergies, current medications, past family history, past medical history, past social history, past surgical history and problem list.  ROS Otherwise as in subjective above    Objective: BP 110/60   Pulse 72   Ht 5\' 10"  (1.778 m)   Wt 264 lb (119.7 kg)   BMI 37.88 kg/m   Wt Readings from Last 3 Encounters:  02/19/23 264 lb (119.7 kg)  01/23/23 267 lb 6.4 oz (121.3 kg)  01/15/23 268 lb 12.6 oz (121.9 kg)   General appearance: alert, no distress, well developed, well nourished Neck: supple, no lymphadenopathy, no thyromegaly, no masses, no JVD Heart: RRR, normal S1, S2, no murmurs Lungs: CTA bilaterally, no wheezes, rhonchi, or rales Pulses: 2+ radial pulses, 2+ pedal pulses, normal cap refill Ext: no edema  Diabetic Foot Exam - Simple   Simple Foot Form Diabetic Foot exam was performed with the following findings: Yes 02/19/2023 12:19 PM  Visual Inspection See comments: Yes Sensation Testing Intact to touch and monofilament testing bilaterally: Yes Pulse  Check Posterior Tibialis and Dorsalis pulse intact bilaterally: Yes Comments Flatfeet, no other significant abnormality        Assessment: Encounter Diagnoses  Name Primary?   Diabetes mellitus with complication (HCC) Yes   Vaccine counseling    Dyslipidemia associated with type 2 diabetes mellitus (HCC)    Vaccine refused by patient    History of atrial fibrillation    Hypercoagulable state due to persistent atrial fibrillation (HCC)  Coronary artery disease due to lipid rich plaque    Anticoagulant long-term use    Need for shingles vaccine      Plan: Diabetes-continue Evaristo B[place] and Mounjaro.  Increase Mounjaro dose.  Routine labs today.  Counseled on diet and exercise recommendations and efforts to lose weight.  He declines vaccines although recommended shingles and tetanus vaccine.  Dyslipidemia-compliant with atorvastatin and Praluent.  Routine labs today.  History of A-fib, coronary artery disease-compliant with medication including anticoagulation.  Follow-up with cardiology as planned  Long-term treatment with Eliquis.  No issues.   Mamoun was seen today for diabetes.  Diagnoses and all orders for this visit:  Diabetes mellitus with complication (HCC) -     Microalbumin / creatinine urine ratio -     Hemoglobin A1c -     Hepatic function panel  Vaccine counseling  Dyslipidemia associated with type 2 diabetes mellitus (HCC) -     Lipid panel -     Hepatic function panel  Vaccine refused by patient  History of atrial fibrillation  Hypercoagulable state due to persistent atrial fibrillation (HCC)  Coronary artery disease due to lipid rich plaque  Anticoagulant long-term use  Need for shingles vaccine  Other orders -     tirzepatide (MOUNJARO) 10 MG/0.5ML Pen; Inject 10 mg into the skin once a week. -     tirzepatide (MOUNJARO) 12.5 MG/0.5ML Pen; Inject 12.5 mg into the skin once a week.   Follow up: pending labs

## 2023-02-20 ENCOUNTER — Other Ambulatory Visit: Payer: Self-pay | Admitting: Medical

## 2023-02-20 MED ORDER — OMEGA-3-ACID ETHYL ESTERS 1 G PO CAPS: 2.0000 | ORAL_CAPSULE | Freq: Two times a day (BID) | ORAL | 3 refills | Status: DC

## 2023-02-20 MED ORDER — ACCU-CHEK SOFTCLIX LANCETS MISC: 1 refills | Status: DC

## 2023-02-20 MED ORDER — PRALUENT 75 MG/ML ~~LOC~~ SOAJ: 75.0000 mg | SUBCUTANEOUS | 3 refills | Status: DC

## 2023-02-20 NOTE — Progress Notes (Signed)
Results sent through MyChart

## 2023-02-20 NOTE — Progress Notes (Signed)
Faxed over a new order for [facility]

## 2023-02-22 ENCOUNTER — Telehealth: Payer: Self-pay | Admitting: Cardiology

## 2023-02-22 DIAGNOSIS — I5032 Chronic diastolic (congestive) heart failure: Secondary | ICD-10-CM

## 2023-02-22 DIAGNOSIS — G4733 Obstructive sleep apnea (adult) (pediatric): Secondary | ICD-10-CM

## 2023-02-22 DIAGNOSIS — I10 Essential (primary) hypertension: Secondary | ICD-10-CM

## 2023-02-22 DIAGNOSIS — I4819 Other persistent atrial fibrillation: Secondary | ICD-10-CM

## 2023-02-22 NOTE — Telephone Encounter (Signed)
Pt states he needs to get a sleep study. Please advise.

## 2023-02-24 ENCOUNTER — Other Ambulatory Visit: Payer: Self-pay | Admitting: Medical

## 2023-02-26 NOTE — Telephone Encounter (Signed)
The DME needs an updated sleep study. His last study was in 2012 no access. Can I order him a new study?

## 2023-02-27 ENCOUNTER — Other Ambulatory Visit: Payer: Self-pay | Admitting: Medical

## 2023-02-28 ENCOUNTER — Other Ambulatory Visit: Payer: Self-pay | Admitting: Medical

## 2023-03-02 DIAGNOSIS — G4733 Obstructive sleep apnea (adult) (pediatric): Secondary | ICD-10-CM | POA: Diagnosis not present

## 2023-03-02 DIAGNOSIS — I10 Essential (primary) hypertension: Secondary | ICD-10-CM | POA: Diagnosis not present

## 2023-03-13 ENCOUNTER — Other Ambulatory Visit: Payer: Self-pay | Admitting: Medical

## 2023-03-13 DIAGNOSIS — G4730 Sleep apnea, unspecified: Secondary | ICD-10-CM | POA: Diagnosis not present

## 2023-03-16 ENCOUNTER — Telehealth: Payer: Self-pay | Admitting: Medical

## 2023-03-16 NOTE — Telephone Encounter (Signed)
(  [name]) PA Case ID #: [id number] Rx #: [id number] Need Help? Call us at [phone] Status sent iconSent to Plan today Drug Praluent 75MG /ML auto-injectors ePA cloud logo Form [employer] Part D

## 2023-03-18 ENCOUNTER — Other Ambulatory Visit: Payer: Self-pay | Admitting: Medical

## 2023-03-18 NOTE — Telephone Encounter (Signed)
P.A. approved til 07/23/23, sent mychart message  

## 2023-03-20 ENCOUNTER — Telehealth: Payer: Self-pay | Admitting: Cardiology

## 2023-03-20 NOTE — Telephone Encounter (Signed)
Patient's wife is requesting call back to discuss sleep study they had done with PCP since he was unable to get a call back from Dr. Mayford Knife to get scheduled. She states that she needs advice as to how to proceed now with the CPAP. Please advise.

## 2023-03-25 ENCOUNTER — Other Ambulatory Visit: Payer: Self-pay | Admitting: Cardiology

## 2023-03-25 DIAGNOSIS — I4819 Other persistent atrial fibrillation: Secondary | ICD-10-CM

## 2023-03-26 NOTE — Telephone Encounter (Signed)
Order placed for a split night study.

## 2023-03-26 NOTE — Telephone Encounter (Signed)
Prescription refill request for Eliquis received. Indication:afib Last office visit:7/24 Scr:0.93  6/24 Age: 72 Weight:119.7  kg  Prescription refilled

## 2023-03-28 ENCOUNTER — Telehealth: Payer: Self-pay | Admitting: Medical

## 2023-03-28 NOTE — Telephone Encounter (Signed)
Pt called and is wanting to know if there is any word on his sleep results

## 2023-03-28 NOTE — Telephone Encounter (Signed)
I will have to call [facility] to see if they have read sleep study yet as pt returned test a week ago

## 2023-03-29 NOTE — Telephone Encounter (Signed)
Pt is scheduled on Tuesday to discuss this

## 2023-03-29 NOTE — Telephone Encounter (Signed)
This is being faxed to [place] today for you to review sleep study

## 2023-04-02 ENCOUNTER — Encounter: Payer: Self-pay | Admitting: Medical

## 2023-04-02 ENCOUNTER — Ambulatory Visit (INDEPENDENT_AMBULATORY_CARE_PROVIDER_SITE_OTHER): Payer: 59 | Admitting: Medical

## 2023-04-02 VITALS — BP 120/70 | HR 83 | Ht 69.5 in | Wt 263.0 lb

## 2023-04-02 DIAGNOSIS — E1169 Type 2 diabetes mellitus with other specified complication: Secondary | ICD-10-CM

## 2023-04-02 DIAGNOSIS — G4733 Obstructive sleep apnea (adult) (pediatric): Secondary | ICD-10-CM

## 2023-04-02 DIAGNOSIS — I10 Essential (primary) hypertension: Secondary | ICD-10-CM

## 2023-04-02 DIAGNOSIS — I5032 Chronic diastolic (congestive) heart failure: Secondary | ICD-10-CM

## 2023-04-02 DIAGNOSIS — E785 Hyperlipidemia, unspecified: Secondary | ICD-10-CM | POA: Diagnosis not present

## 2023-04-02 MED ORDER — TIRZEPATIDE 15 MG/0.5ML ~~LOC~~ SOAJ: 15.0000 mg | SUBCUTANEOUS | 3 refills | Status: DC

## 2023-04-02 MED ORDER — TIRZEPATIDE 12.5 MG/0.5ML ~~LOC~~ SOAJ: 12.5000 mg | SUBCUTANEOUS | 0 refills | Status: DC

## 2023-04-02 NOTE — Progress Notes (Signed)
Subjective:  [name] is a 72 y.o. male who presents for Chief Complaint  Patient presents with   Follow-up    Results from sleep study test.     Here for follow up on sleep study.  He has been using CPAP but his equipment is at the end of its useful life, the equipment is getting a warning message every day.  He uses his CPAP every day even when taking a nap.  His home health supplier is [facility].  He currently is on 16 cm H20 currently and this pressure setting seems to work just fine.  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  He knows he may be eats more than he should.  He is compliant with his medicines including Mounjaro.  Not exercising much.  No other aggravating or relieving factors.    No other c/o.  Past Medical History:  Diagnosis Date   Atrial flutter (HCC)    a. atypical atrial flutter.   Bell's palsy    CAD (coronary artery disease)    a. 07/2009 Cath: LM nl, LAD nl, LCX nl w/ L->R collats, chronically occluded RCA s/p failed PCI;  b. 07/2012 MV: basal inf mild ischemia.   Central retinal artery occlusion, left eye    diagnosed in 2008   Cerebrovascular accident [facility])    a. 03/2008 secondary to cardioembolic event;  b. chronic coumadin.   Chronic diastolic CHF (congestive heart failure) (HCC)    a. 12/2008 Echo: nl EF.   Diabetes mellitus 11/14/2010   TYPE II   Hematuria 05/2013   Urology, Dr. Lindley Magnus   Hyperlipidemia    Hypertension    Hypogonadism, male 05/2013   Dr. Lindley Magnus, Urology   Morbid obesity [facility])    Obesity    Obstructive sleep apnea    Persistent atrial fibrillation [facility])    a. s/p afib ablation by [facility] 08/01/08   Recommendation refused by patient    multiple recommended vaccines refused over the years (flu/pneumovax)   Skin infection    chronic left leg herpetic   Current Outpatient Medications on File Prior to Visit  Medication Sig Dispense Refill   Accu-Chek Softclix Lancets lancets Test  1-2 times daily 100 each 1   Alirocumab (PRALUENT) 75 MG/ML SOAJ Inject 1 mL (75 mg total) into the skin every 14 (fourteen) days. 6 mL 3   amLODipine (NORVASC) 5 MG tablet Take 1 tablet (5 mg total) by mouth daily. 90 tablet 3   apixaban (ELIQUIS) 5 MG TABS tablet Take 1 tablet by mouth twice daily 180 tablet 1   Ascorbic Acid (VITAMIN C PO) Take 1 tablet by mouth every morning.     atorvastatin (LIPITOR) 80 MG tablet Take 1 tablet by mouth once daily 90 tablet 2   b complex vitamins capsule Take 1 capsule by mouth every other day.      Blood Glucose Monitoring Suppl (ACCU-CHEK GUIDE ME) w/Device KIT Test 1-2 times daily 1 kit 0   cyanocobalamin (VITAMIN B12) 1000 MCG/ML injection INJECT 1 ML INTO THE MUSCLE EVERY TWO WEEKS 2 mL 4   fluticasone (FLONASE) 50 MCG/ACT nasal spray Place 1 spray into both nostrils daily. 16 g 0   glucose blood (ACCU-CHEK GUIDE) test strip USE  STRIP TO CHECK GLUCOSE ONCE TO TWICE DAILY 100 each 1   Insulin Pen Needle (BD PEN NEEDLE NANO 2ND GEN) 32G X 4 MM MISC USE NEEDLES AT BEDTIME 100  each 1   isosorbide mononitrate (IMDUR) 30 MG 24 hr tablet Take 1 tablet (30 mg total) by mouth daily. 90 tablet 3   losartan (COZAAR) 100 MG tablet Take 1 tablet by mouth once daily 90 tablet 2   metoprolol tartrate (LOPRESSOR) 50 MG tablet TAKE 1 TABLET BY MOUTH TWICE DAILY MAY  TAKE  1  EXTRA  IF  HEART  RATE  IS  110  OR  HIGHER. 180 tablet 2   NIACIN PO Take 1 tablet by mouth daily.     omega-3 acid ethyl esters (LOVAZA) 1 g capsule Take 2 capsules (2 g total) by mouth 2 (two) times daily. 360 capsule 3   SYRINGE/NEEDLE, DISP, 1 ML (BD ECLIPSE SYRINGE) 25G X 5/8" 1 ML MISC Use as needed for b12 injections 50 each 5   testosterone (ANDROGEL) 50 MG/5GM (1%) GEL APPLY 2 PACKETS TOPICALLY ONTO THE SKIN ONCE DAILY     TRESIBA FLEXTOUCH 100 UNIT/ML FlexTouch Pen INJECT 18 UNITS SUBCUTANEOUSLY AT BEDTIME 15 mL 0   valACYclovir (VALTREX) 500 MG tablet Take 1 tablet by mouth once daily  90 tablet 0   vitamin B-12 (CYANOCOBALAMIN) 1000 MCG tablet Take 1 tablet (1,000 mcg total) by mouth daily. 90 tablet 3   VITAMIN D PO Take 1 tablet by mouth daily.     No current facility-administered medications on file prior to visit.     The following portions of the patient's history were reviewed and updated as appropriate: allergies, current medications, past family history, past medical history, past social history, past surgical history and problem list.  ROS Otherwise as in subjective above    Objective: BP 120/70   Pulse 83   Ht 5' 9.5" (1.765 m)   Wt 263 lb (119.3 kg)   BMI 38.28 kg/m   Wt Readings from Last 3 Encounters:  04/02/23 263 lb (119.3 kg)  02/19/23 264 lb (119.7 kg)  01/23/23 267 lb 6.4 oz (121.3 kg)   General appearance: alert, no distress, well developed, well nourished     Assessment: Encounter Diagnoses  Name Primary?   OSA (obstructive sleep apnea) Yes   Essential hypertension, benign    Dyslipidemia associated with type 2 diabetes mellitus (HCC)    Chronic diastolic heart failure (HCC)      Plan: OSA-recent sleep study shows AHI of 39, over 15 minutes less than 88% oxygen.  He has been using 16 cm H2O pressure setting on his old CPAP needs a new CPAP set up as his is at its end of his life.  Referral to [facility] today for updated CPAP supplies  Hypertension-continue current therapies  History of heart failure-continue routine follow-up with cardiology, continue CPAP  Diabetes type 2-we discussed the fact that he has not lost much weight despite the fact that he is on a higher dose Mounjaro.  This suggest that he could do much better with diet and exercise.  Counseled on diet, status, efforts to lose weight.  Increase to Mounjaro 12.5 mg for 1 month then go up to the 15 mg highest dose..  We discussed that he should lose weight on higher doses of this medicine so he needs to work harder on lifestyle.  We discussed that if he is  successful at losing weight in the coming months we could potentially lower some dosing of blood pressure and diabetes medicine.    Govinda was seen today for follow-up.  Diagnoses and all orders for this visit:  OSA (obstructive sleep  apnea)  Essential hypertension, benign  Dyslipidemia associated with type 2 diabetes mellitus (HCC)  Chronic diastolic heart failure (HCC)  Other orders -     tirzepatide (MOUNJARO) 15 MG/0.5ML Pen; Inject 15 mg into the skin once a week. -     tirzepatide (MOUNJARO) 12.5 MG/0.5ML Pen; Inject 12.5 mg into the skin once a week.    Follow up: 2-3 months

## 2023-04-03 ENCOUNTER — Other Ambulatory Visit: Payer: Self-pay | Admitting: *Deleted

## 2023-04-03 DIAGNOSIS — G4733 Obstructive sleep apnea (adult) (pediatric): Secondary | ICD-10-CM

## 2023-04-15 ENCOUNTER — Telehealth: Payer: Self-pay | Admitting: Medical

## 2023-04-15 NOTE — Telephone Encounter (Signed)
Pt wanted to let you know that he's got an appt this Thursday to pick up a new sleep machine

## 2023-04-18 DIAGNOSIS — G4733 Obstructive sleep apnea (adult) (pediatric): Secondary | ICD-10-CM | POA: Diagnosis not present

## 2023-04-19 NOTE — Telephone Encounter (Signed)
Pt scheduled for 05/20/23

## 2023-04-19 NOTE — Telephone Encounter (Signed)
Schedule him for a 30-day virtual or in person compliance visit.  His insurance company is requiring documentation that he is using the device and I will need a compliance report from wherever he is getting his CPAP supplies.  Please let the supplier know to send [place] a compliance report

## 2023-04-22 ENCOUNTER — Ambulatory Visit: Payer: 59 | Admitting: Medical

## 2023-04-25 ENCOUNTER — Encounter (HOSPITAL_COMMUNITY): Payer: Self-pay | Admitting: Internal Medicine

## 2023-04-25 ENCOUNTER — Ambulatory Visit (HOSPITAL_COMMUNITY)
Admission: RE | Admit: 2023-04-25 | Discharge: 2023-04-25 | Disposition: A | Discharge status: Home / Self Care | Payer: 59 | Source: Ambulatory Visit | Attending: Internal Medicine | Admitting: Internal Medicine

## 2023-04-25 VITALS — BP 122/70 | HR 80 | Ht 69.5 in | Wt 257.8 lb

## 2023-04-25 DIAGNOSIS — Z794 Long term (current) use of insulin: Secondary | ICD-10-CM | POA: Insufficient documentation

## 2023-04-25 DIAGNOSIS — I110 Hypertensive heart disease with heart failure: Secondary | ICD-10-CM | POA: Diagnosis not present

## 2023-04-25 DIAGNOSIS — G4733 Obstructive sleep apnea (adult) (pediatric): Secondary | ICD-10-CM | POA: Insufficient documentation

## 2023-04-25 DIAGNOSIS — Z7901 Long term (current) use of anticoagulants: Secondary | ICD-10-CM | POA: Diagnosis not present

## 2023-04-25 DIAGNOSIS — I2510 Atherosclerotic heart disease of native coronary artery without angina pectoris: Secondary | ICD-10-CM | POA: Diagnosis not present

## 2023-04-25 DIAGNOSIS — I4892 Unspecified atrial flutter: Secondary | ICD-10-CM | POA: Diagnosis not present

## 2023-04-25 DIAGNOSIS — Z7985 Long-term (current) use of injectable non-insulin antidiabetic drugs: Secondary | ICD-10-CM | POA: Diagnosis not present

## 2023-04-25 DIAGNOSIS — I5032 Chronic diastolic (congestive) heart failure: Secondary | ICD-10-CM | POA: Diagnosis not present

## 2023-04-25 DIAGNOSIS — E119 Type 2 diabetes mellitus without complications: Secondary | ICD-10-CM | POA: Insufficient documentation

## 2023-04-25 DIAGNOSIS — Z8673 Personal history of transient ischemic attack (TIA), and cerebral infarction without residual deficits: Secondary | ICD-10-CM | POA: Diagnosis not present

## 2023-04-25 DIAGNOSIS — I4819 Other persistent atrial fibrillation: Secondary | ICD-10-CM | POA: Insufficient documentation

## 2023-04-25 NOTE — Progress Notes (Signed)
Primary Care Physician: Jac Canavan, PA-C Referring Physician:  Kaylob Wallen is a 72 y.o. male with a h/o CAD, CVA, DM II,atrial  flutter/fib with ablations in 2010/2016. He  continues on eliquis with a CHA2DS2VASc score of 7.  F/u in afib clinic, 12/06/20. I have not seen since 08/2020. He had return to afib with RVR and had a successful cardioversion in the ER 11/30/20. EKG now shows SR. He feels the start of a new job may have triggered it. 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 He does not drink alcohol or use  tobacco. He is religious to use CPAP. Moderate coffee consumption.   On follow up 01/15/23, he is currently in atrial flutter with RVR. He called this morning noting HR 151 for the past several weeks, left chest soreness, and sweating. He has been taking Lopressor 50 mg to lower HR. Wife notes his CPAP machine has not been working for the past several weeks and wondering if this could have been his trigger. He also had a viral illness ~2 weeks ago. He last took [facility] medication Monday 6/17 (Ozempic is in list but not taking this currently). He has not missed any doses of Eliquis.   On follow up 01/23/23, he is currently in NSR. He went to the ED on 6/25 directly from our OV due to being in atrial flutter with RVR and pre-syncopal. He underwent successful urgent DCCV on 01/15/23 with conversion to NSR. He feels well since cardioversion. No missed doses of Eliquis. He did purchase updated BP cuff to monitor his BP and HR at home.  On follow up 04/25/23, he is currently in NSR. He feels well overall. Since last office visit, he has had no episodes of Afib. No bleeding issues on Eliquis.   Today, he denies symptoms of palpitations, chest pain, shortness of breath, orthopnea, PND, lower extremity edema, dizziness, presyncope, syncope, or neurologic sequela. The patient is tolerating medications without difficulties and is otherwise without  complaint today.   Past Medical History:  Diagnosis Date   Atrial flutter (HCC)    a. atypical atrial flutter.   Bell's palsy    CAD (coronary artery disease)    a. 07/2009 Cath: LM nl, LAD nl, LCX nl w/ L->R collats, chronically occluded RCA s/p failed PCI;  b. 07/2012 MV: basal inf mild ischemia.   Central retinal artery occlusion, left eye    diagnosed in 2008   Cerebrovascular accident [facility])    a. 03/2008 secondary to cardioembolic event;  b. chronic coumadin.   Chronic diastolic CHF (congestive heart failure) (HCC)    a. 12/2008 Echo: nl EF.   Diabetes mellitus 11/14/2010   TYPE II   Hematuria 05/2013   Urology, Dr. Lindley Magnus   Hyperlipidemia    Hypertension    Hypogonadism, male 05/2013   Dr. Lindley Magnus, Urology   Morbid obesity [facility])    Obesity    Obstructive sleep apnea    Persistent atrial fibrillation (HCC)    a. s/p afib ablation by [facility] 08/01/08   Recommendation refused by patient    multiple recommended vaccines refused over the years (flu/pneumovax)   Skin infection    chronic left leg herpetic   Past Surgical History:  Procedure Laterality Date   ATRIAL ABLATION SURGERY  08/01/08   CTI and PVI ablation by [facility]   COLONOSCOPY     2012 per patient   ELECTROPHYSIOLOGIC STUDY N/A 11/30/2014  Procedure: Atrial Fibrillation Ablation;  Surgeon: Hillis Range, MD;  Location: [facility] INVASIVE CV LAB;  Service: Cardiovascular;  Laterality: N/A;   TEE WITHOUT CARDIOVERSION N/A 11/30/2014   Procedure: TRANSESOPHAGEAL ECHOCARDIOGRAM (TEE);  Surgeon: Wendall Stade, MD;  Location: [facility] ENDOSCOPY;  Service: Cardiovascular;  Laterality: N/A;    Current Outpatient Medications  Medication Sig Dispense Refill   Accu-Chek Softclix Lancets lancets Test 1-2 times daily 100 each 1   Alirocumab (PRALUENT) 75 MG/ML SOAJ Inject 1 mL (75 mg total) into the skin every 14 (fourteen) days. 6 mL 3   amLODipine (NORVASC) 5 MG tablet Take 1 tablet (5 mg total) by mouth daily. 90 tablet 3   apixaban (ELIQUIS) 5 MG  TABS tablet Take 1 tablet by mouth twice daily 180 tablet 1   Ascorbic Acid (VITAMIN C PO) Take 1 tablet by mouth every morning.     atorvastatin (LIPITOR) 80 MG tablet Take 1 tablet by mouth once daily 90 tablet 2   b complex vitamins capsule Take 1 capsule by mouth every other day.      Blood Glucose Monitoring Suppl (ACCU-CHEK GUIDE ME) w/Device KIT Test 1-2 times daily 1 kit 0   cyanocobalamin (VITAMIN B12) 1000 MCG/ML injection INJECT 1 ML INTO THE MUSCLE EVERY TWO WEEKS 2 mL 4   fluticasone (FLONASE) 50 MCG/ACT nasal spray Place 1 spray into both nostrils daily. 16 g 0   glucose blood (ACCU-CHEK GUIDE) test strip USE  STRIP TO CHECK GLUCOSE ONCE TO TWICE DAILY 100 each 1   Insulin Pen Needle (BD PEN NEEDLE NANO 2ND GEN) 32G X 4 MM MISC USE NEEDLES AT BEDTIME 100 each 1   isosorbide mononitrate (IMDUR) 30 MG 24 hr tablet Take 1 tablet (30 mg total) by mouth daily. 90 tablet 3   losartan (COZAAR) 100 MG tablet Take 1 tablet by mouth once daily 90 tablet 2   metoprolol tartrate (LOPRESSOR) 50 MG tablet TAKE 1 TABLET BY MOUTH TWICE DAILY MAY  TAKE  1  EXTRA  IF  HEART  RATE  IS  110  OR  HIGHER. 180 tablet 2   NIACIN PO Take 1 tablet by mouth daily.     omega-3 acid ethyl esters (LOVAZA) 1 g capsule Take 2 capsules (2 g total) by mouth 2 (two) times daily. 360 capsule 3   SYRINGE/NEEDLE, DISP, 1 ML (BD ECLIPSE SYRINGE) 25G X 5/8" 1 ML MISC Use as needed for b12 injections 50 each 5   testosterone (ANDROGEL) 50 MG/5GM (1%) GEL APPLY 2 PACKETS TOPICALLY ONTO THE SKIN ONCE DAILY     tirzepatide (MOUNJARO) 12.5 MG/0.5ML Pen Inject 12.5 mg into the skin once a week. 6 mL 0   tirzepatide (MOUNJARO) 15 MG/0.5ML Pen Inject 15 mg into the skin once a week. 6 mL 3   TRESIBA FLEXTOUCH 100 UNIT/ML FlexTouch Pen INJECT 18 UNITS SUBCUTANEOUSLY AT BEDTIME 15 mL 0   valACYclovir (VALTREX) 500 MG tablet Take 1 tablet by mouth once daily 90 tablet 0   vitamin B-12 (CYANOCOBALAMIN) 1000 MCG tablet Take 1 tablet  (1,000 mcg total) by mouth daily. 90 tablet 3   VITAMIN D PO Take 1 tablet by mouth daily.     No current facility-administered medications for this encounter.    Allergies  Allergen Reactions   Crestor [Rosuvastatin] Other (See Comments)    Made him feel crazy   Zetia [Ezetimibe] Other (See Comments)    Made him feel crazy   Lisinopril Cough   Marcelline Deist [  Dapagliflozin]     Made heart race   Metformin And Related     Mental fog    ROS- All systems are reviewed and negative except as per the HPI above  Physical Exam: Vitals:   04/25/23 1027  BP: 122/70  Pulse: 80  Weight: 116.9 kg  Height: 5' 9.5" (1.765 m)   Wt Readings from Last 3 Encounters:  04/25/23 116.9 kg  04/02/23 119.3 kg  02/19/23 119.7 kg    Labs: Lab Results  Component Value Date   NA 135 01/15/2023   K 4.2 01/15/2023   CL 106 01/15/2023   CO2 20 (L) 01/15/2023   GLUCOSE 154 (H) 01/15/2023   BUN 13 01/15/2023   CREATININE 0.93 01/15/2023   CALCIUM 8.7 (L) 01/15/2023   MG 2.1 11/30/2020   Lab Results  Component Value Date   INR 1.3 (H) 01/15/2023   Lab Results  Component Value Date   CHOL 95 (L) 02/19/2023   HDL 46 02/19/2023   LDLCALC 30 02/19/2023   TRIG 97 02/19/2023   GEN- The patient is well appearing, alert and oriented x 3 today.   Neck - no JVD or carotid bruit noted Lungs- Clear to ausculation bilaterally, normal work of breathing Heart- Regular rate and rhythm, no murmurs, rubs or gallops, PMI not laterally displaced Extremities- no clubbing, cyanosis, or edema Skin - no rash or ecchymosis noted  EKG: Vent. rate 80 BPM PR interval 192 ms QRS duration 86 ms QT/QTcB 364/419 ms P-R-T axes 63 7 28 Normal sinus rhythm Normal ECG When compared with ECG of 23-Jan-2023 09:52, PREVIOUS ECG IS PRESENT  Echo 11/18/2014-   - Left ventricle: The cavity size was normal. There was moderate    focal basal hypertrophy of the septum. Systolic function was    normal. The estimated  ejection fraction was in the range of 60%    to 65%. Wall motion was normal; there were no regional wall    motion abnormalities. Doppler parameters are consistent with    pseudonormal left ventricular relaxation (grade 2 diastolic    dysfunction). The E/A ratio is 1.8, the E/e&' ratio is >15,   suggesting elevated LV filling pressure.  - Aortic valve: Trileaflet; mildly calcified leaflets.  - Mitral valve: Calcified annulus. There was trivial regurgitation.  - Left atrium: The atrium was at the upper limits of normal in    size.  - Tricuspid valve: There was moderate regurgitation.  - Pulmonary arteries: PA peak pressure: 48 mm Hg (S).  - Inferior vena cava: The vessel was normal in size. The    respirophasic diameter changes were in the normal range (= 50%),    consistent with normal central venous pressure.   Assessment and Plan: 1. Afib/flutter  S/p DCCV in ED on 01/15/23 with successful conversion to NSR.  He is currently in NSR. Will continue with conservative observation for now. He is doing well. In the future, he is amenable to repeat ablation.    2. CHA2DS2VASc score of 7 Continue Eliquis 5 mg BID - no missed doses.   3. OSA He is currently trying to get CPAP machine fixed / replaced.  F/u 6 months Afib clinic.  L[place] Bells, PA-C Afib Clinic [facility] [address] [place], [place] [address] [phone]

## 2023-05-03 ENCOUNTER — Other Ambulatory Visit: Payer: Self-pay | Admitting: Medical

## 2023-05-03 NOTE — Telephone Encounter (Signed)
Dose increased.

## 2023-05-08 DIAGNOSIS — G4733 Obstructive sleep apnea (adult) (pediatric): Secondary | ICD-10-CM | POA: Diagnosis not present

## 2023-05-08 DIAGNOSIS — I10 Essential (primary) hypertension: Secondary | ICD-10-CM | POA: Diagnosis not present

## 2023-05-14 ENCOUNTER — Other Ambulatory Visit: Payer: Self-pay | Admitting: Medical

## 2023-05-15 ENCOUNTER — Encounter (INDEPENDENT_AMBULATORY_CARE_PROVIDER_SITE_OTHER): Payer: 59 | Admitting: Ophthalmology

## 2023-05-15 DIAGNOSIS — H43813 Vitreous degeneration, bilateral: Secondary | ICD-10-CM

## 2023-05-15 DIAGNOSIS — H35033 Hypertensive retinopathy, bilateral: Secondary | ICD-10-CM

## 2023-05-15 DIAGNOSIS — H338 Other retinal detachments: Secondary | ICD-10-CM | POA: Diagnosis not present

## 2023-05-15 DIAGNOSIS — H34812 Central retinal vein occlusion, left eye, with macular edema: Secondary | ICD-10-CM

## 2023-05-15 DIAGNOSIS — I10 Essential (primary) hypertension: Secondary | ICD-10-CM

## 2023-05-18 DIAGNOSIS — G4733 Obstructive sleep apnea (adult) (pediatric): Secondary | ICD-10-CM | POA: Diagnosis not present

## 2023-05-20 ENCOUNTER — Institutional Professional Consult (permissible substitution): Payer: 59 | Admitting: Medical

## 2023-05-20 ENCOUNTER — Encounter (INDEPENDENT_AMBULATORY_CARE_PROVIDER_SITE_OTHER): Payer: 59 | Admitting: Ophthalmology

## 2023-05-20 NOTE — H&P (Signed)
[name] is an 72 y.o. male.   Chief Complaint:Reduced vision field left eye HPI: eye with chronic CRVO left eye.  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  Past Medical History:  Diagnosis Date   Atrial flutter (HCC)    a. atypical atrial flutter.   Bell's palsy    CAD (coronary artery disease)    a. 07/2009 Cath: LM nl, LAD nl, LCX nl w/ L->R collats, chronically occluded RCA s/p failed PCI;  b. 07/2012 MV: basal inf mild ischemia.   Central retinal artery occlusion, left eye    diagnosed in 2008   Cerebrovascular accident [facility])    a. 03/2008 secondary to cardioembolic event;  b. chronic coumadin.   Chronic diastolic CHF (congestive heart failure) (HCC)    a. 12/2008 Echo: nl EF.   Diabetes mellitus 11/14/2010   TYPE II   Hematuria 05/2013   Urology, Dr. Lindley Magnus   Hyperlipidemia    Hypertension    Hypogonadism, male 05/2013   Dr. Lindley Magnus, Urology   Morbid obesity [facility])    Obesity    Obstructive sleep apnea    Persistent atrial fibrillation (HCC)    a. s/p afib ablation by [facility] 08/01/08   Recommendation refused by patient    multiple recommended vaccines refused over the years (flu/pneumovax)   Skin infection    chronic left leg herpetic    Past Surgical History:  Procedure Laterality Date   ATRIAL ABLATION SURGERY  08/01/08   CTI and PVI ablation by [facility]   COLONOSCOPY     2012 per patient   ELECTROPHYSIOLOGIC STUDY N/A 11/30/2014   Procedure: Atrial Fibrillation Ablation;  Surgeon: Hillis Range, MD;  Location: [facility] INVASIVE CV LAB;  Service: Cardiovascular;  Laterality: N/A;   TEE WITHOUT CARDIOVERSION N/A 11/30/2014   Procedure: TRANSESOPHAGEAL ECHOCARDIOGRAM (TEE);  Surgeon: Wendall Stade, MD;  Location: [facility] ENDOSCOPY;  Service: Cardiovascular;  Laterality: N/A;    Family History  Problem Relation Age of Onset   Stroke Father    Heart disease Father    Heart disease Mother    Social History:  reports that he quit smoking about 43 years ago. His smoking use  included cigarettes. He has never used smokeless tobacco. He reports that he does not drink alcohol and does not use drugs.  Allergies:  Allergies  Allergen Reactions   Crestor [Rosuvastatin] Other (See Comments)    Made him feel crazy   Zetia [Ezetimibe] Other (See Comments)    Made him feel crazy   Lisinopril Cough   Farxiga [Dapagliflozin]     Made heart race   Metformin And Related     Mental fog    No medications prior to admission.    Review of systems otherwise negative  There were no vitals taken for this visit.  Physical exam: Mental status: oriented x3. Eyes: See eye exam associated with this date of surgery in media tab.  Scanned in by scanning center Ears, Nose, Throat: within normal limits Neck: Within Normal limits General: within normal limits Chest: Within normal limits Breast: deferred Heart: Within normal limits Abdomen: Within normal limits GU: deferred Extremities: within normal limits Skin: within normal limits  Assessment/Plan Rhegmatogenous retinal detachment left eye Plan: To [facility] for Scleral buckle, gas injection. Possible pars plana vitrectomy, photocoagulation with laser left eye  Sherrie George 05/20/2023, 7:26 AM

## 2023-05-21 ENCOUNTER — Ambulatory Visit (INDEPENDENT_AMBULATORY_CARE_PROVIDER_SITE_OTHER): Payer: 59 | Admitting: Medical

## 2023-05-21 VITALS — BP 118/64 | HR 75 | Wt 265.0 lb

## 2023-05-21 DIAGNOSIS — G4733 Obstructive sleep apnea (adult) (pediatric): Secondary | ICD-10-CM | POA: Diagnosis not present

## 2023-05-21 DIAGNOSIS — I10 Essential (primary) hypertension: Secondary | ICD-10-CM | POA: Diagnosis not present

## 2023-05-21 DIAGNOSIS — E118 Type 2 diabetes mellitus with unspecified complications: Secondary | ICD-10-CM | POA: Diagnosis not present

## 2023-05-21 MED ORDER — TIRZEPATIDE 15 MG/0.5ML ~~LOC~~ SOAJ: 15.0000 mg | SUBCUTANEOUS | 3 refills | Status: DC

## 2023-05-21 NOTE — Progress Notes (Signed)
Subjective:  [name] is a 72 y.o. male who presents for Chief Complaint  Patient presents with   cpap compliance    Cpap compliance     Here for follow up on CPAP.  In September we had visit to renew equipment as his equipment was at the end of its useful life.   He got a new CPAP set up, been using it every time he sleep or naps.  Its working well.  His home health supplier is [facility].  His prior level was 16 cm H20 currently and this pressure setting seems to work just fine. The new machine has 16-20 setting and this is working ok.    He is still on Mounjaro 12.5mg  weekly but getting ready to go up to 15mg  weekly in about 2 weeks  No other aggravating or relieving factors.    No other c/o.  Past Medical History:  Diagnosis Date   Atrial flutter (HCC)    a. atypical atrial flutter.   Bell's palsy    CAD (coronary artery disease)    a. 07/2009 Cath: LM nl, LAD nl, LCX nl w/ L->R collats, chronically occluded RCA s/p failed PCI;  b. 07/2012 MV: basal inf mild ischemia.   Central retinal artery occlusion, left eye    diagnosed in 2008   Cerebrovascular accident [facility])    a. 03/2008 secondary to cardioembolic event;  b. chronic coumadin.   Chronic diastolic CHF (congestive heart failure) (HCC)    a. 12/2008 Echo: nl EF.   Diabetes mellitus 11/14/2010   TYPE II   Hematuria 05/2013   Urology, Dr. Lindley Magnus   Hyperlipidemia    Hypertension    Hypogonadism, male 05/2013   Dr. Lindley Magnus, Urology   Morbid obesity [facility])    Obesity    Obstructive sleep apnea    Persistent atrial fibrillation [facility])    a. s/p afib ablation by [facility] 08/01/08   Recommendation refused by patient    multiple recommended vaccines refused over the years (flu/pneumovax)   Skin infection    chronic left leg herpetic   Current Outpatient Medications on File Prior to Visit  Medication Sig Dispense Refill   Accu-Chek Softclix Lancets lancets Test 1-2 times daily 100 each 1   Alirocumab (PRALUENT) 75 MG/ML SOAJ  Inject 1 mL (75 mg total) into the skin every 14 (fourteen) days. 6 mL 3   amLODipine (NORVASC) 5 MG tablet Take 1 tablet (5 mg total) by mouth daily. 90 tablet 3   apixaban (ELIQUIS) 5 MG TABS tablet Take 1 tablet by mouth twice daily 180 tablet 1   Ascorbic Acid (VITAMIN C PO) Take 1 tablet by mouth every morning.     atorvastatin (LIPITOR) 80 MG tablet Take 1 tablet by mouth once daily 90 tablet 2   b complex vitamins capsule Take 1 capsule by mouth every other day.      Blood Glucose Monitoring Suppl (ACCU-CHEK GUIDE ME) w/Device KIT Test 1-2 times daily 1 kit 0   cyanocobalamin (VITAMIN B12) 1000 MCG/ML injection INJECT 1 ML INTO THE MUSCLE EVERY TWO WEEKS 2 mL 4   glucose blood (ACCU-CHEK GUIDE) test strip USE  STRIP TO CHECK GLUCOSE ONCE TO TWICE DAILY 100 each 1   Insulin Pen Needle (BD PEN NEEDLE NANO 2ND GEN) 32G X 4 MM MISC USE NEEDLES AT BEDTIME 100 each 1   isosorbide mononitrate (IMDUR) 30 MG 24 hr tablet Take 1 tablet (30 mg total) by mouth daily. 90 tablet 3  losartan (COZAAR) 100 MG tablet Take 1 tablet by mouth once daily 90 tablet 2   metoprolol tartrate (LOPRESSOR) 50 MG tablet TAKE 1 TABLET BY MOUTH TWICE DAILY MAY  TAKE  1  EXTRA  IF  HEART  RATE  IS  110  OR  HIGHER. 180 tablet 2   NIACIN PO Take 1 tablet by mouth daily.     omega-3 acid ethyl esters (LOVAZA) 1 g capsule Take 2 capsules (2 g total) by mouth 2 (two) times daily. 360 capsule 3   SYRINGE/NEEDLE, DISP, 1 ML (BD ECLIPSE SYRINGE) 25G X 5/8" 1 ML MISC Use as needed for b12 injections 50 each 5   testosterone (ANDROGEL) 50 MG/5GM (1%) GEL APPLY 2 PACKETS TOPICALLY ONTO THE SKIN ONCE DAILY     tirzepatide (MOUNJARO) 12.5 MG/0.5ML Pen Inject 12.5 mg into the skin once a week. 6 mL 0   TRESIBA FLEXTOUCH 100 UNIT/ML FlexTouch Pen INJECT 18 UNITS SUBCUTANEOUSLY AT BEDTIME 15 mL 0   valACYclovir (VALTREX) 500 MG tablet Take 1 tablet by mouth once daily 90 tablet 0   vitamin B-12 (CYANOCOBALAMIN) 1000 MCG tablet Take 1  tablet (1,000 mcg total) by mouth daily. 90 tablet 3   VITAMIN D PO Take 1 tablet by mouth daily.     fluticasone (FLONASE) 50 MCG/ACT nasal spray Place 1 spray into both nostrils daily. (Patient not taking: Reported on 05/21/2023) 16 g 0   tirzepatide (MOUNJARO) 15 MG/0.5ML Pen Inject 15 mg into the skin once a week. (Patient not taking: Reported on 05/21/2023) 6 mL 3   No current facility-administered medications on file prior to visit.     The following portions of the patient's history were reviewed and updated as appropriate: allergies, current medications, past family history, past medical history, past social history, past surgical history and problem list.  ROS Otherwise as in subjective above    Objective: BP 118/64   Pulse 75   Wt 265 lb (120.2 kg)   BMI 38.57 kg/m   Wt Readings from Last 3 Encounters:  05/21/23 265 lb (120.2 kg)  04/25/23 257 lb 12.8 oz (116.9 kg)  04/02/23 263 lb (119.3 kg)   General appearance: alert, no distress, well developed, well nourished    Assessment: Encounter Diagnoses  Name Primary?   OSA (obstructive sleep apnea) Yes   Essential hypertension, benign    Diabetes mellitus with complication (HCC)    Morbid obesity (HCC)      Plan: OSA-doing well on CPAP, compliance report shows 100% use and he feels as if it is working great  Hypertension-continue current therapies  Diabetes type 2-go ahead and increase to 15mg  Mounjaro in 2 weeks as planned   [name] was seen today for cpap compliance.  Diagnoses and all orders for this visit:  OSA (obstructive sleep apnea)  Essential hypertension, benign  Diabetes mellitus with complication (HCC)  Morbid obesity (HCC)   Follow up: as scheduled soon for fasting med check

## 2023-05-22 ENCOUNTER — Telehealth: Payer: Self-pay | Admitting: Family Medicine

## 2023-05-22 NOTE — Telephone Encounter (Signed)
[employer] and [employer] are used for CPAP supplies

## 2023-05-27 ENCOUNTER — Other Ambulatory Visit: Payer: Self-pay | Admitting: Medical

## 2023-05-28 ENCOUNTER — Encounter (INDEPENDENT_AMBULATORY_CARE_PROVIDER_SITE_OTHER): Payer: 59 | Admitting: Ophthalmology

## 2023-05-30 ENCOUNTER — Telehealth: Payer: Self-pay | Admitting: Cardiology

## 2023-05-30 ENCOUNTER — Encounter (INDEPENDENT_AMBULATORY_CARE_PROVIDER_SITE_OTHER): Payer: 59 | Admitting: Ophthalmology

## 2023-05-30 DIAGNOSIS — H338 Other retinal detachments: Secondary | ICD-10-CM

## 2023-05-30 NOTE — Telephone Encounter (Signed)
Callback team please contact patient for more information regarding his form required prior to surgery.  Thank you

## 2023-05-30 NOTE — Telephone Encounter (Signed)
Did not see clearance on [employer]. Attempted to reach pt but no answer

## 2023-05-30 NOTE — Telephone Encounter (Signed)
Patient  need Dr. Mayford Knife to look at this form before eye surgery and sign it for him. Please reach out to the patient for questions. 05-30-2023

## 2023-06-01 ENCOUNTER — Other Ambulatory Visit: Payer: Self-pay | Admitting: Medical

## 2023-06-03 NOTE — Telephone Encounter (Signed)
[name] , patient's chart was reviewed for preoperative cardiac evaluation.  He/she was seen by you on 04/25/2023 and according to protocol, we request that you comment on cardiac risk for upcoming procedure since office visit was less than 2 months ago. He is scheduled for retina surgery on 06/11/23.        Please route your response to p cv div preop.  Thank you, Joni Reining DNP, [facility]

## 2023-06-03 NOTE — Telephone Encounter (Signed)
   Pre-operative Risk Assessment    Patient Name: [name]  DOB: 17-Nov-1950 MRN: [id number]    DATE OF LAST VISIT: 07/05/22 DR. TURNER DATE OF NEXT VISIT: NONE  Request for Surgical Clearance    Procedure:   RETINA SURGERY  Date of Surgery:  Clearance 06/11/23                                 Surgeon:  DR. Jonny Ruiz MATTHEWS Surgeon's Group or Practice Name:  [facility] Phone number:  [phone] Fax number:  [phone]   Type of Clearance Requested:   - Medical  - Pharmacy:  Hold Apixaban (Eliquis) PER CLEARANCE REQUEST ANY BLOOD THINNERS NEED TO BE HELD x 5 DAYS PRIOR   Type of Anesthesia:  General    Additional requests/questions:    Elpidio Anis   06/03/2023, 4:24 PM

## 2023-06-03 NOTE — Telephone Encounter (Signed)
Pharmacy please advise on holding Eliquis prior to retinal surgery scheduled for 06/11/2023. (They sent this request today! Thank you.

## 2023-06-04 NOTE — Telephone Encounter (Signed)
I will send back to pre op as needing preop pharm-d recommendations.

## 2023-06-04 NOTE — Pre-Procedure Instructions (Signed)
Surgical Instructions   Your procedure is scheduled on Tuesday, November 19th. Report to [facility] Main Entrance "A" at 09:30 A.M., then check in with the Admitting office. Any questions or running late day of surgery: call [phone]  Questions prior to your surgery date: call [phone], Monday-Friday, 8am-4pm. If you experience any cold or flu symptoms such as cough, fever, chills, shortness of breath, etc. between now and your scheduled surgery, please notify us at the above number.     Remember:  Do not eat or drink after midnight the night before your surgery    Take these medicines the morning of surgery with A SIP OF WATER  amLODipine (NORVASC)  atorvastatin (LIPITOR)  isosorbide mononitrate (IMDUR)  metoprolol tartrate (LOPRESSOR)  valACYclovir (VALTREX)    Follow your surgeon's instructions on when to stop apixaban (ELIQUIS).  If no instructions were given by your surgeon then you will need to call the office to get those instructions.    One week prior to surgery, STOP taking any Aspirin (unless otherwise instructed by your surgeon) Aleve, Naproxen, Ibuprofen, Motrin, Advil, Goody's, BC's, all herbal medications, fish oil, and non-prescription vitamins.  WHAT DO I DO ABOUT MY DIABETES MEDICATION?  STOP TAKING tirzepatide [facility]) 7 days prior to surgery. Last dose on or before 11/11.   THE NIGHT BEFORE SURGERY, take 9 units (50%) of TRESIBA FLEXTOUCH.    HOW TO MANAGE YOUR DIABETES BEFORE AND AFTER SURGERY  Why is it important to control my blood sugar before and after surgery? Improving blood sugar levels before and after surgery helps healing and can limit problems. A way of improving blood sugar control is eating a healthy diet by:  Eating less sugar and carbohydrates  Increasing activity/exercise  Talking with your doctor about reaching your blood sugar goals High blood sugars (greater than 180 mg/dL) can raise your risk of infections and slow your  recovery, so you will need to focus on controlling your diabetes during the weeks before surgery. Make sure that the doctor who takes care of your diabetes knows about your planned surgery including the date and location.  How do I manage my blood sugar before surgery? Check your blood sugar at least 4 times a day, starting 2 days before surgery, to make sure that the level is not too high or low.  Check your blood sugar the morning of your surgery when you wake up and every 2 hours until you get to the Short Stay unit.  If your blood sugar is less than 70 mg/dL, you will need to treat for low blood sugar: Do not take insulin. Treat a low blood sugar (less than 70 mg/dL) with  cup of clear juice (cranberry or apple), 4 glucose tablets, OR glucose gel. Recheck blood sugar in 15 minutes after treatment (to make sure it is greater than 70 mg/dL). If your blood sugar is not greater than 70 mg/dL on recheck, call [phone] for further instructions. Report your blood sugar to the short stay nurse when you get to Short Stay.  If you are admitted to the hospital after surgery: Your blood sugar will be checked by the staff and you will probably be given insulin after surgery (instead of oral diabetes medicines) to make sure you have good blood sugar levels. The goal for blood sugar control after surgery is 80-180 mg/dL.                     Do NOT Smoke (Tobacco/Vaping) for 24  hours prior to your procedure.  If you use a CPAP at night, you may bring your mask/headgear for your overnight stay.   You will be asked to remove any contacts, glasses, piercing's, hearing aid's, dentures/partials prior to surgery. Please bring cases for these items if needed.    Patients discharged the day of surgery will not be allowed to drive home, and someone needs to stay with them for 24 hours.  SURGICAL WAITING ROOM VISITATION Patients may have no more than 2 support people in the waiting area - these visitors  may rotate.   Pre-op nurse will coordinate an appropriate time for 1 ADULT support person, who may not rotate, to accompany patient in pre-op.  Children under the age of 6 must have an adult with them who is not the patient and must remain in the main waiting area with an adult.  If the patient needs to stay at the hospital during part of their recovery, the visitor guidelines for inpatient rooms apply.  Please refer to the [facility] website for the visitor guidelines for any additional information.   If you received a COVID test during your pre-op visit  it is requested that you wear a mask when out in public, stay away from anyone that may not be feeling well and notify your surgeon if you develop symptoms. If you have been in contact with anyone that has tested positive in the last 10 days please notify you surgeon.      Pre-operative CHG Bathing Instructions   You can play a key role in reducing the risk of infection after surgery. Your skin needs to be as free of germs as possible. You can reduce the number of germs on your skin by washing with CHG (chlorhexidine gluconate) soap before surgery. CHG is an antiseptic soap that kills germs and continues to kill germs even after washing.   DO NOT use if you have an allergy to chlorhexidine/CHG or antibacterial soaps. If your skin becomes reddened or irritated, stop using the CHG and notify one of our RNs at [phone].              TAKE A SHOWER THE NIGHT BEFORE SURGERY AND THE DAY OF SURGERY    Please keep in mind the following:  DO NOT shave, including legs and underarms, 48 hours prior to surgery.   You may shave your face before/day of surgery.  Place clean sheets on your bed the night before surgery Use a clean washcloth (not used since being washed) for each shower. DO NOT sleep with pet's night before surgery.  CHG Shower Instructions:  Wash your face and private area with normal soap. If you choose to wash your hair, wash  first with your normal shampoo.  After you use shampoo/soap, rinse your hair and body thoroughly to remove shampoo/soap residue.  Turn the water OFF and apply half the bottle of CHG soap to a CLEAN washcloth.  Apply CHG soap ONLY FROM YOUR NECK DOWN TO YOUR TOES (washing for 3-5 minutes)  DO NOT use CHG soap on face, private areas, open wounds, or sores.  Pay special attention to the area where your surgery is being performed.  If you are having back surgery, having someone wash your back for you may be helpful. Wait 2 minutes after CHG soap is applied, then you may rinse off the CHG soap.  Pat dry with a clean towel  Put on clean pajamas    Additional instructions for the  day of surgery: DO NOT APPLY any lotions, deodorants, cologne, or perfumes.   Do not wear jewelry or makeup Do not wear nail polish, gel polish, artificial nails, or any other type of covering on natural nails (fingers and toes) Do not bring valuables to the hospital. [facility] is not responsible for valuables/personal belongings. Put on clean/comfortable clothes.  Please brush your teeth.  Ask your nurse before applying any prescription medications to the skin.

## 2023-06-05 ENCOUNTER — Encounter (INDEPENDENT_AMBULATORY_CARE_PROVIDER_SITE_OTHER): Payer: 59 | Admitting: General Practice

## 2023-06-05 ENCOUNTER — Telehealth: Payer: Self-pay | Admitting: Pharmacist

## 2023-06-05 ENCOUNTER — Encounter (HOSPITAL_COMMUNITY): Payer: Self-pay

## 2023-06-05 ENCOUNTER — Other Ambulatory Visit: Payer: Self-pay

## 2023-06-05 ENCOUNTER — Encounter (HOSPITAL_COMMUNITY)
Admission: RE | Admit: 2023-06-05 | Discharge: 2023-06-05 | Disposition: A | Discharge status: Home / Self Care | Payer: 59 | Source: Ambulatory Visit | Attending: Ophthalmology | Admitting: Ophthalmology

## 2023-06-05 ENCOUNTER — Encounter: Payer: Self-pay | Admitting: General Practice

## 2023-06-05 ENCOUNTER — Ambulatory Visit: Payer: 59 | Admitting: Physician Assistant

## 2023-06-05 VITALS — BP 110/70 | HR 79 | Ht 70.0 in | Wt 262.0 lb

## 2023-06-05 VITALS — BP 131/78 | HR 76 | Temp 98.2°F | Resp 18 | Ht 70.0 in | Wt 262.6 lb

## 2023-06-05 DIAGNOSIS — I5032 Chronic diastolic (congestive) heart failure: Secondary | ICD-10-CM | POA: Diagnosis not present

## 2023-06-05 DIAGNOSIS — I2510 Atherosclerotic heart disease of native coronary artery without angina pectoris: Secondary | ICD-10-CM

## 2023-06-05 DIAGNOSIS — I4819 Other persistent atrial fibrillation: Secondary | ICD-10-CM | POA: Insufficient documentation

## 2023-06-05 DIAGNOSIS — G4733 Obstructive sleep apnea (adult) (pediatric): Secondary | ICD-10-CM | POA: Insufficient documentation

## 2023-06-05 DIAGNOSIS — Z8673 Personal history of transient ischemic attack (TIA), and cerebral infarction without residual deficits: Secondary | ICD-10-CM | POA: Diagnosis not present

## 2023-06-05 DIAGNOSIS — Z7985 Long-term (current) use of injectable non-insulin antidiabetic drugs: Secondary | ICD-10-CM | POA: Insufficient documentation

## 2023-06-05 DIAGNOSIS — Z01818 Encounter for other preprocedural examination: Secondary | ICD-10-CM | POA: Diagnosis not present

## 2023-06-05 DIAGNOSIS — I110 Hypertensive heart disease with heart failure: Secondary | ICD-10-CM | POA: Insufficient documentation

## 2023-06-05 DIAGNOSIS — Z79899 Other long term (current) drug therapy: Secondary | ICD-10-CM | POA: Diagnosis not present

## 2023-06-05 DIAGNOSIS — Z794 Long term (current) use of insulin: Secondary | ICD-10-CM | POA: Insufficient documentation

## 2023-06-05 DIAGNOSIS — I4892 Unspecified atrial flutter: Secondary | ICD-10-CM | POA: Diagnosis not present

## 2023-06-05 DIAGNOSIS — I2583 Coronary atherosclerosis due to lipid rich plaque: Secondary | ICD-10-CM | POA: Insufficient documentation

## 2023-06-05 DIAGNOSIS — E785 Hyperlipidemia, unspecified: Secondary | ICD-10-CM | POA: Diagnosis not present

## 2023-06-05 DIAGNOSIS — I10 Essential (primary) hypertension: Secondary | ICD-10-CM

## 2023-06-05 DIAGNOSIS — E119 Type 2 diabetes mellitus without complications: Secondary | ICD-10-CM | POA: Diagnosis not present

## 2023-06-05 DIAGNOSIS — Z01810 Encounter for preprocedural cardiovascular examination: Secondary | ICD-10-CM

## 2023-06-05 DIAGNOSIS — Z7901 Long term (current) use of anticoagulants: Secondary | ICD-10-CM | POA: Insufficient documentation

## 2023-06-05 LAB — CBC
HCT: 46.6 % (ref 39.0–52.0)
Hemoglobin: 14.6 g/dL (ref 13.0–17.0)
MCH: 26.1 pg (ref 26.0–34.0)
MCHC: 31.3 g/dL (ref 30.0–36.0)
MCV: 83.2 fL (ref 80.0–100.0)
Platelets: 257 10*3/uL (ref 150–400)
RBC: 5.6 MIL/uL (ref 4.22–5.81)
RDW: 18.3 % — ABNORMAL HIGH (ref 11.5–15.5)
WBC: 6.6 10*3/uL (ref 4.0–10.5)
nRBC: 0 % (ref 0.0–0.2)

## 2023-06-05 LAB — HEMOGLOBIN A1C
Hgb A1c MFr Bld: 6.9 % — ABNORMAL HIGH (ref 4.8–5.6)
Mean Plasma Glucose: 151.33 mg/dL

## 2023-06-05 LAB — BASIC METABOLIC PANEL

## 2023-06-05 LAB — GLUCOSE, CAPILLARY: Glucose-Capillary: 121 mg/dL — ABNORMAL HIGH (ref 70–99)

## 2023-06-05 NOTE — Progress Notes (Signed)
BMP hemolyzed at PAT appt, per lab. Will need to be redrawn on day of surgery

## 2023-06-05 NOTE — Progress Notes (Signed)
PCP - Crosby Oyster, PA-C Cardiologist - Dr. Armanda Magic [facility]- Travis Bells, PA-C  PPM/ICD - denies   Chest x-ray - 01/15/23 EKG - 04/25/23 Stress Test - 11/18/14 ECHO - 11/30/14 Cardiac Cath - 2011  Sleep Study - OSA+ CPAP - nightly, 16-20 pressure setting  Fasting Blood Sugar - 120-135 Checks Blood Sugar once a day  Last dose of GLP1 agonist-  06/03/23 GLP1 instructions: Hold 7 days. Pt knows not to take any further doses  Blood Thinner Instructions: Pt is going to cardiac appt today to find out eliquis instructions Aspirin Instructions: n/a  ERAS Protcol - no, NPO   COVID TEST- n/a   Anesthesia review: Yes, pt has appt for pre-op clearance and eliquis instructions at 1330 today. [name] is aware of this pt  Patient denies shortness of breath, fever, cough and chest pain at PAT appointment   All instructions explained to the patient, with a verbal understanding of the material. Patient agrees to go over the instructions while at home for a better understanding.  The opportunity to ask questions was provided.

## 2023-06-05 NOTE — Telephone Encounter (Signed)
Patient with diagnosis of A fib on Eliquis for anticoagulation.    Procedure: retinal surgery Date of procedure: 06/11/23   CHA2DS2-VASc Score = 7  This indicates a 11.2% annual risk of stroke. The patient's score is based upon: CHF History: 1 HTN History: 1 Diabetes History: 1 Stroke History: 2 Vascular Disease History: 1 Age Score: 1 Gender Score: 0    CrCl 93 mL/min using adj body weight Platelet count 219K  Request is to hold Eliquis for 5 days. Patient has elevated CHADS-VASc score and history of CVA. Will need approval from cardiologist to hold 5 days. Recommend 2 day Eliquis hold.  **This guidance is not considered finalized until pre-operative APP has relayed final recommendations.**

## 2023-06-05 NOTE — Progress Notes (Addendum)
Cardiology Clinic Note   Patient Name: [name] Date of Encounter: 06/05/2023  Primary Care Provider:  Jac Canavan, PA-C Primary Cardiologist:  Armanda Magic, MD  Patient Profile    [name] 72 year old male presents to the clinic today for preoperative cardiac evaluation  Past Medical History    Past Medical History:  Diagnosis Date   Atrial flutter (HCC)    a. atypical atrial flutter.   Bell's palsy    affects left side of face   CAD (coronary artery disease)    a. 07/2009 Cath: LM nl, LAD nl, LCX nl w/ L->R collats, chronically occluded RCA s/p failed PCI;  b. 07/2012 MV: basal inf mild ischemia.   Central retinal artery occlusion, left eye    diagnosed in 2008   Cerebrovascular accident [facility])    a. 03/2008 secondary to cardioembolic event;  b. chronic coumadin.   Chronic diastolic CHF (congestive heart failure) (HCC)    a. 12/2008 Echo: nl EF.   Diabetes mellitus 11/14/2010   TYPE II   Hematuria 05/2013   Urology, Dr. Lindley Magnus   Hyperlipidemia    Hypertension    Hypogonadism, male 05/2013   Dr. Lindley Magnus, Urology   Morbid obesity [facility])    Obesity    Obstructive sleep apnea    wears CPAP   Persistent atrial fibrillation (HCC)    a. s/p afib ablation by [facility] 08/01/08   Recommendation refused by patient    multiple recommended vaccines refused over the years (flu/pneumovax)   Skin infection    chronic left leg herpetic   Past Surgical History:  Procedure Laterality Date   ATRIAL ABLATION SURGERY  08/01/08   CTI and PVI ablation by [facility]   COLONOSCOPY     2012 per patient   ELECTROPHYSIOLOGIC STUDY N/A 11/30/2014   Procedure: Atrial Fibrillation Ablation;  Surgeon: Hillis Range, MD;  Location: [facility] INVASIVE CV LAB;  Service: Cardiovascular;  Laterality: N/A;   TEE WITHOUT CARDIOVERSION N/A 11/30/2014   Procedure: TRANSESOPHAGEAL ECHOCARDIOGRAM (TEE);  Surgeon: Wendall Stade, MD;  Location: [facility] ENDOSCOPY;  Service: Cardiovascular;  Laterality: N/A;     Allergies  Allergies  Allergen Reactions   Crestor [Rosuvastatin] Other (See Comments)    Made him feel crazy   Zetia [Ezetimibe] Other (See Comments)    Made him feel crazy   Lisinopril Cough   Farxiga [Dapagliflozin] Other (See Comments)    Made heart race   Metformin And Related Other (See Comments)    Mental fog    History of Present Illness    [name] is a PMH of coronary artery disease, CVA, type 2 diabetes, atrial fibrillation/atrial flutter.  He had ablation procedures in 2010 and 2016.  His CHA2DS2-VASc score is 7.  He was seen in follow-up by the A-fib clinic on 04/25/2023.  During that time he remained in sinus rhythm.  He remained stable from a cardiac standpoint.  He reported compliance with his apixaban.  He denied bleeding issues.  He denied chest pain and shortness of breath.  He was added to my schedule today for urgent preop cardiac evaluation and follow up.  He presents to the clinic today for preoperative cardiac evaluation and follow-up evaluation.  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  We reviewed his previous cardiac history and atrial fibrillation.  He expressed understanding.  He denies exertional chest pain.  We reviewed his upcoming surgery.  I will continue his current medical therapy.  I recommended increased physical  activity.  Will plan follow-up in 9 to 12 months.  Today he denies chest pain, shortness of breath, lower extremity edema, fatigue, palpitations, melena, hematuria, hemoptysis, diaphoresis, weakness, presyncope, syncope, orthopnea, and PND.    Home Medications    Prior to Admission medications   Medication Sig Start Date End Date Taking? Authorizing Provider  Accu-Chek Softclix Lancets lancets Test 1-2 times daily 02/20/23   Tysinger, Kermit Balo, PA-C  Alirocumab (PRALUENT) 75 MG/ML SOAJ Inject 1 mL (75 mg total) into the skin every 14 (fourteen) days. 02/20/23   Tysinger, Kermit Balo, PA-C  amLODipine (NORVASC) 5 MG tablet Take 1  tablet (5 mg total) by mouth daily. 10/01/22   Quintella Reichert, MD  apixaban (ELIQUIS) 5 MG TABS tablet Take 1 tablet by mouth twice daily 03/26/23   Quintella Reichert, MD  Ascorbic Acid (VITAMIN C PO) Take 1 tablet by mouth every morning.    [provider]  atorvastatin (LIPITOR) 80 MG tablet Take 1 tablet by mouth once daily 10/16/22   Quintella Reichert, MD  b complex vitamins capsule Take 1 capsule by mouth every other day.     [provider]  cyanocobalamin (VITAMIN B12) 1000 MCG/ML injection INJECT 1 ML INTO THE MUSCLE EVERY TWO WEEKS 03/13/23   Tysinger, Kermit Balo, PA-C  fluticasone (FLONASE) 50 MCG/ACT nasal spray Place 1 spray into both nostrils daily. Patient taking differently: Place 1 spray into both nostrils as needed for allergies. 01/06/23   Raspet, Denny Peon K, PA-C  glucose blood (ACCU-CHEK GUIDE) test strip USE  STRIP TO CHECK GLUCOSE ONCE TO TWICE DAILY 02/25/23   Tysinger, Kermit Balo, PA-C  Insulin Pen Needle (BD PEN NEEDLE NANO 2ND GEN) 32G X 4 MM MISC USE NEEDLES AT BEDTIME 03/19/23   Tysinger, Kermit Balo, PA-C  isosorbide mononitrate (IMDUR) 30 MG 24 hr tablet Take 1 tablet (30 mg total) by mouth daily. 09/10/22   Quintella Reichert, MD  losartan (COZAAR) 100 MG tablet Take 1 tablet by mouth once daily 10/16/22   Quintella Reichert, MD  metoprolol tartrate (LOPRESSOR) 50 MG tablet TAKE 1 TABLET BY MOUTH TWICE DAILY MAY  TAKE  1  EXTRA  IF  HEART  RATE  IS  110  OR  HIGHER. 10/04/22   Quintella Reichert, MD  NIACIN PO Take 1 tablet by mouth daily.    [provider]  omega-3 acid ethyl esters (LOVAZA) 1 g capsule Take 2 capsules (2 g total) by mouth 2 (two) times daily. 02/20/23   Tysinger, Kermit Balo, PA-C  SYRINGE/NEEDLE, DISP, 1 ML (BD ECLIPSE SYRINGE) 25G X 5/8" 1 ML MISC Use as needed for b12 injections 02/01/22   Tysinger, Kermit Balo, PA-C  testosterone (ANDROGEL) 50 MG/5GM (1%) GEL APPLY 2 PACKETS TOPICALLY ONTO THE SKIN ONCE DAILY 10/22/20   [provider]  tirzepatide Greggory Keen)  15 MG/0.5ML Pen Inject 15 mg into the skin once a week. 05/21/23   Tysinger, Kermit Balo, PA-C  TRESIBA FLEXTOUCH 100 UNIT/ML FlexTouch Pen INJECT 18 UNITS SUBCUTANEOUSLY AT BEDTIME 05/15/23   Tysinger, Kermit Balo, PA-C  valACYclovir (VALTREX) 500 MG tablet Take 1 tablet by mouth once daily 05/27/23   Tysinger, Kermit Balo, PA-C  vitamin B-12 (CYANOCOBALAMIN) 1000 MCG tablet Take 1 tablet (1,000 mcg total) by mouth daily. 02/01/22   Tysinger, Kermit Balo, PA-C  VITAMIN D PO Take 1 tablet by mouth daily.    [provider]    Family History    Family History  Problem Relation Age of Onset   Stroke Father    Heart disease Father    Heart disease Mother    He indicated that his mother is alive. He indicated that his father is deceased. He indicated that his maternal grandmother is deceased. He indicated that his maternal grandfather is deceased. He indicated that his paternal grandmother is deceased. He indicated that his paternal grandfather is deceased.  Social History    Social History   Socioeconomic History   Marital status: Married    Spouse name: Not on file   Number of children: 1   Years of education: Not on file   Highest education level: Not on file  Occupational History   Not on file  Tobacco Use   Smoking status: Former    Current packs/day: 0.00    Types: Cigarettes    Quit date: 07/24/1979    Years since quitting: 43.8   Smokeless tobacco: Never   Tobacco comments:    Former smoker 04/25/23  Vaping Use   Vaping status: Never Used  Substance and Sexual Activity   Alcohol use: Not Currently   Drug use: No   Sexual activity: Not on file  Other Topics Concern   Not on file  Social History Narrative   Lives in [place], [place]. With his spouse and son. Has remote history of tobacco, but quit 35 years ago. Has heavy history of alcohol consumption but quit 30 years ago. Previously used marijuana and cocaine, but denies use over the past 30 years. 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   Social Determinants of Health   Financial Resource Strain: Low Risk  (11/19/2022)   Received from [facility], [facility]   Overall Financial Resource Strain (CARDIA)    Difficulty of Paying Living Expenses: Not hard at all  Food Insecurity: No Food Insecurity (11/19/2022)   Received from [facility], [facility]   Hunger Vital Sign    Worried About Running Out of Food in the Last Year: Never true    Ran Out of Food in the Last Year: Never true  Transportation Needs: No Transportation Needs (11/19/2022)   Received from [facility], [facility]   PRAPARE - Transportation    Lack of Transportation (Medical): No    Lack of Transportation (Non-Medical): No  Physical Activity: Not on file  Stress: Not on file  Social Connections: Unknown (12/05/2021)   Received from [facility], [facility]   Social Network    Social Network: Not on file  Intimate Partner Violence: Unknown (10/27/2021)   Received from [facility], [facility]   HITS    Physically Hurt: Not on file    Insult or Talk Down To: Not on file    Threaten Physical Harm: Not on file    Scream or Curse: Not on file     Review of Systems    General:  No chills, fever, night sweats or weight changes.  Cardiovascular:  No chest pain, dyspnea on exertion, edema, orthopnea, palpitations, paroxysmal nocturnal dyspnea. Dermatological: No rash, lesions/masses Respiratory: No cough, dyspnea Urologic: No hematuria, dysuria Abdominal:   No nausea, vomiting, diarrhea, bright red blood per rectum, melena, or hematemesis Neurologic:  No visual changes, wkns, changes in mental status. All other systems reviewed and are otherwise negative except as noted above.  Physical Exam    VS:  BP 110/70 (BP Location: Left Arm, Patient Position: Sitting, Cuff Size: Large)   Pulse 79   Ht 5'  10" (1.778 m)   Wt 262 lb (118.8 kg)   BMI 37.59 kg/m  , BMI Body mass index is 37.59 kg/m. GEN:  Well nourished, well developed, in no acute distress. HEENT: normal. Neck: Supple, no JVD, carotid bruits, or masses. Cardiac: RRR, no murmurs, rubs, or gallops. No clubbing, cyanosis, edema.  Radials/DP/PT 2+ and equal bilaterally.  Respiratory:  Respirations regular and unlabored, clear to auscultation bilaterally. GI: Soft, nontender, nondistended, BS + x 4. MS: no deformity or atrophy. Skin: warm and dry, no rash. Neuro:  Strength and sensation are intact. Psych: Normal affect.  Accessory Clinical Findings    Recent Labs: 01/15/2023: BUN 13; Creatinine, Ser 0.93; Potassium 4.2; Sodium 135 02/19/2023: ALT 16 06/05/2023: Hemoglobin 14.6; Platelets 257   Recent Lipid Panel    Component Value Date/Time   CHOL 95 (L) 02/19/2023 1222   TRIG 97 02/19/2023 1222   HDL 46 02/19/2023 1222   CHOLHDL 2.1 02/19/2023 1222   CHOLHDL 3.3 07/30/2017 1050   VLDL 44 (H) 09/06/2016 1020   LDLCALC 30 02/19/2023 1222   LDLCALC 65 07/30/2017 1050   LDLDIRECT 87.7 05/08/2013 0837         ECG personally reviewed by me today- EKG Interpretation Date/Time:  Wednesday June 05 2023 12:58:56 EST Ventricular Rate:  79 PR Interval:  188 QRS Duration:  82 QT Interval:  382 QTC Calculation: 438 R Axis:   -12  Text Interpretation: Normal sinus rhythm Confirmed by Edd Fabian [phone]) on 06/05/2023 1:29:03 PM    Echocardiogram 11/18/2014  Study Conclusions   - Left ventricle: The cavity size was normal. There was moderate    focal basal hypertrophy of the septum. Systolic function was    normal. The estimated ejection fraction was in the range of 60%    to 65%. Wall motion was normal; there were no regional wall    motion abnormalities. Doppler parameters are consistent with    pseudonormal left ventricular relaxation (grade 2 diastolic    dysfunction). The E/A ratio is 1.8, the E/e&' ratio is >15,   suggesting elevated LV filling pressure.  - Aortic valve: Trileaflet; mildly calcified  leaflets.  - Mitral valve: Calcified annulus. There was trivial regurgitation.  - Left atrium: The atrium was at the upper limits of normal in    size.  - Tricuspid valve: There was moderate regurgitation.  - Pulmonary arteries: PA peak pressure: 48 mm Hg (S).  - Inferior vena cava: The vessel was normal in size. The    respirophasic diameter changes were in the normal range (= 50%),    consistent with normal central venous pressure.   Impressions:   - LVEF 60-65%, moderate focal basal septal hypertrophy without LVOT    obstruction, normal wall motion, Stage 2 diastolic dysfunction,    upper normal LV size, MAC, trivial MR, moderate TR, RVSP 48 mmHg,    normal IVC size.   -------------------------------------------------------------------  Labs, prior tests, procedures, and surgery:  Transthoracic echocardiography (04/05/2008).     EF was 50%.   Transthoracic echocardiography.  M-mode, complete 2D, spectral  Doppler, and color Doppler.  Birthdate:  Patient birthdate:  1951/07/18.  Age:  Patient is 72 yr old.  Sex:  Gender: male.  BMI: 40.2 kg/m^2.  Blood pressure:     132/74  Patient status:  Outpatient.  Study date:  Study date: 11/18/2014. Study time: 07:25  AM.  Location:  [facility]   -------------------------------------------------------------------   -------------------------------------------------------------------  Left ventricle:  The cavity  size was normal. There was moderate  focal basal hypertrophy of the septum. Systolic function was  normal. The estimated ejection fraction was in the range of 60% to  65%. Wall motion was normal; there were no regional wall motion  abnormalities. Doppler parameters are consistent with pseudonormal  left ventricular relaxation (grade 2 diastolic dysfunction). The  E/A ratio is 1.8, the E/e&' ratio is >15, suggesting elevated LV  filling pressure.   -------------------------------------------------------------------   Aortic valve:   Trileaflet; mildly calcified leaflets. Sclerosis  without stenosis.   -------------------------------------------------------------------  Aorta: Aortic root: The aortic root was normal in size.  Ascending aorta: The ascending aorta was normal in size.   -------------------------------------------------------------------  Mitral valve:   Calcified annulus.  Doppler:  There was trivial  regurgitation.    Peak gradient (D): 7 mm Hg.   -------------------------------------------------------------------  Left atrium:  The atrium was at the upper limits of normal in size.    -------------------------------------------------------------------  Atrial septum:  No defect or patent foramen ovale was identified.    -------------------------------------------------------------------  Right ventricle:  The cavity size was normal. Wall thickness was  normal. Systolic function was normal.   -------------------------------------------------------------------  Pulmonic valve:    The valve appears to be grossly normal.  Doppler:  There was no significant regurgitation.   -------------------------------------------------------------------  Tricuspid valve:   Doppler:  There was moderate regurgitation.   -------------------------------------------------------------------  Pulmonary artery:   The main pulmonary artery was normal-sized.   -------------------------------------------------------------------  Right atrium:  The atrium was normal in size.   -------------------------------------------------------------------  Pericardium: There was no pericardial effusion.   -------------------------------------------------------------------  Systemic veins:  Inferior vena cava: The vessel was normal in size. The  respirophasic diameter changes were in the normal range (= 50%),  consistent with normal central venous pressure.       Assessment & Plan   1. Atrial  fibrillation/flutter-EKG today shows normal sinus rhythm 79 bpm.  Reports compliance with apixaban.  Denies bleeding issues. Continue metoprolol, apixaban Avoid triggers caffeine, chocolate, EtOH, dehydration etc.  Essential hypertension-BP today 110/70. Maintain blood pressure log Heart healthy low-sodium diet Continue current medical therapy  Coronary artery disease-no chest pain today.  Denies recent episodes of arm neck back or chest discomfort.  Stress testing 11/18/2014 showed low risk. Continue atorvastatin, metoprolol, amlodipine  Hyperlipidemia-LDL 30 on 02/19/23. High-fiber diet Continue current medical therapy Follows with PCP  Preoperative cardiac evaluation retinal surgery, Dr. Alan Mulder, [facility], fax #[phone]   Primary Cardiologist: Armanda Magic, MD  Chart reviewed as part of pre-operative protocol coverage. Given past medical history and time since last visit, based on ACC/AHA guidelines, Pastor Yell would be at acceptable risk for the planned procedure without further cardiovascular testing.   His RCRI is moderate risk, 6.6% risk of major cardiac event.  He is able to complete greater than 4 METS of physical activity.  Patient was advised that if he develops new symptoms prior to surgery to contact our office to arrange a follow-up appointment.  He verbalized understanding.  Patient with diagnosis of A fib on Eliquis for anticoagulation.     Procedure: retinal surgery Date of procedure: 06/11/23     CHA2DS2-VASc Score = 7  This indicates a 11.2% annual risk of stroke. The patient's score is based upon: CHF History: 1 HTN History: 1 Diabetes History: 1 Stroke History: 2 Vascular Disease History: 1 Age Score: 1 Gender Score: 0     CrCl 93 mL/min using adj body weight  Platelet count 219K   Request is to hold Eliquis for 5 days. Patient has elevated CHADS-VASc score and history of CVA.   Per Dr. Balinda Quails "Recommend  either holding only 2 days or if 5 days needed then needs bridge ."  I will route this recommendation to the requesting party via Epic fax function and remove from pre-op pool.    Disposition: Follow-up with Dr. Mayford Knife or APP in 9-12 months.   Thomasene Ripple. Ranger Petrich NP-C     06/05/2023, 1:28 PM [facility] [address] Office [phone] Fax [phone]    I spent 14 minutes examining this patient, reviewing medications, and using patient centered shared decision making involving her cardiac care.   I spent greater than 20 minutes reviewing her past medical history,  medications, and prior cardiac tests.

## 2023-06-05 NOTE — Patient Instructions (Signed)
Medication Instructions:  WE ARE WAITING ON FURTHER DIRECTIONS FOR YOUR ELIQUIS-WE WILL CALL YOU FOR FURTHER INSTRUCTIONS *If you need a refill on your cardiac medications before your next appointment, please call your pharmacy*  Lab Work: NONE  Other Instructions INCREASE PHYSICAL ACTIVITY-AS TOLERATED  Follow-Up: At [facility], you and your health needs are our priority.  As part of our continuing mission to provide you with exceptional heart care, we have created designated Provider Care Teams.  These Care Teams include your primary Cardiologist (physician) and Advanced Practice Providers (APPs -  Physician Assistants and Nurse Practitioners) who all work together to provide you with the care you need, when you need it.  We recommend signing up for the patient portal called "MyChart".  Sign up information is provided on this After Visit Summary.  MyChart is used to connect with patients for Virtual Visits (Telemedicine).  Patients are able to view lab/test results, encounter notes, upcoming appointments, etc.  Non-urgent messages can be sent to your provider as well.   To learn more about what you can do with MyChart, go to [email or web address].    Your next appointment:   9-12 month(s)  Provider:   Armanda Magic, MD

## 2023-06-05 NOTE — Telephone Encounter (Signed)
He has appt today to discuss this with APP added to the schedule.

## 2023-06-05 NOTE — Telephone Encounter (Signed)
-----   Message from Ronney Asters sent at 06/05/2023 12:18 PM EST ----- Regarding: Preop patient added urgently to my schedule Mr. Snuggs was urgently added to my schedule.  When you have a moment would you be able to provide recommendations on holding Eliquis prior to retinal surgery scheduled for 06/11/2023.  Thank you for your help.  Thomasene Ripple. Cleaver NP-C  [image]   06/05/2023, 12:22 PM [facility] [address] Office [phone] Fax [phone]

## 2023-06-06 ENCOUNTER — Ambulatory Visit: Payer: 59 | Admitting: Medical

## 2023-06-06 NOTE — Anesthesia Preprocedure Evaluation (Addendum)
Anesthesia Evaluation  Patient identified by MRN, date of birth, ID band Patient awake    Reviewed: Allergy & Precautions, H&P , NPO status , Patient's Chart, lab work & pertinent test results  Airway Mallampati: II  TM Distance: >3 FB Neck ROM: Full    Dental no notable dental hx.    Pulmonary sleep apnea , former smoker   Pulmonary exam normal breath sounds clear to auscultation       Cardiovascular hypertension, + CAD and +CHF  Normal cardiovascular exam+ dysrhythmias Atrial Fibrillation  Rhythm:Regular Rate:Normal     Neuro/Psych CVA negative neurological ROS  negative psych ROS   GI/Hepatic negative GI ROS, Neg liver ROS,,,  Endo/Other  diabetes, Type 2    Renal/GU negative Renal ROS  negative genitourinary   Musculoskeletal negative musculoskeletal ROS (+)    Abdominal   Peds negative pediatric ROS (+)  Hematology negative hematology ROS (+)   Anesthesia Other Findings retinal detachment left eye  Reproductive/Obstetrics negative OB ROS                             Anesthesia Physical Anesthesia Plan  ASA: 3  Anesthesia Plan: General   Post-op Pain Management:    Induction: Intravenous  PONV Risk Score and Plan: 3 and Ondansetron, Dexamethasone and Treatment may vary due to age or medical condition  Airway Management Planned: Oral ETT  Additional Equipment:   Intra-op Plan:   Post-operative Plan: Extubation in OR  Informed Consent: I have reviewed the patients History and Physical, chart, labs and discussed the procedure including the risks, benefits and alternatives for the proposed anesthesia with the patient or authorized representative who has indicated his/her understanding and acceptance.     Dental advisory given  Plan Discussed with: CRNA  Anesthesia Plan Comments: (PAT note by Antionette Poles, PA-C: 72 year old male follows with cardiology for history of CAD  with chronically occluded RCA s/p failed PCI with left-to-right collaterals, HTN, OSA on CPAP, persistent AF s/p ablation, A-flutter unable to ablate an EP study, HLD, prior CVA 2009 and central retinal artery occlusion of the left eye in 2008.  Seen by Edd Fabian, NP 06/05/2023 for preop eval.  Per note, "Chart reviewed as part of pre-operative protocol coverage. Given past medical history and time since last visit, based on ACC/AHA guidelines, [name] would be at acceptable risk for the planned procedure without further cardiovascular testing. His RCRI is moderate risk, 6.6% risk of major cardiac event.  He is able to complete greater than 4 METS of physical activity. Patient was advised that if he develops new symptoms prior to surgery to contact our office to arrange a follow-up appointment.  He verbalized understanding. Patient with diagnosis of A fib on Eliquis for anticoagulation.Marland KitchenMarland KitchenRequest is to hold Eliquis for 5 days. Patient has elevated CHADS-VASc score and history of CVA.  Per Dr. Balinda Quails "Recommend either holding only 2 days or if 5 days needed then needs bridge .""  I reached out directly to Dr. Ashley Royalty to discuss hold time for Eliquis.  He advised he did not feel patient needed a full 5 days, requested patient hold for 3 days with last dose at 6 AM on 06/08/2023.  He did not feel patient would require bridging with this hold time.  I called and advised the patient of Dr. Ashley Royalty instructions.  Patient verbalized understanding.  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  He is on once weekly GLP-1 agonist, reports last  dose 06/03/2023.  Preop CBC reviewed, unremarkable.  BMP hemolyzed and will need to be recollected day of surgery.  EKG 06/05/2023: NSR.  Rate 79.  TEE 11/30/2014: Normal LV EF 60% Normal atria Small PFO by color flow Normal AV Mild MR No LAA thrombus Normal RV No pericardial effusion Mild mural aortic debris  Ok to proceed with ablations Discussed with Dr  Johney Frame  [name]  Nuclear stress 11/18/2014: Overall Impression: Inferior/inferolateral defect consistent with probable soft tissue attenuation with possible mild ischemia in the mid inferolateral wall.  Overall low risk scan.     LV Ejection Fraction: 60%.  LV Wall Motion:  NL LV Function; NL Wall Motion  )        Anesthesia Quick Evaluation

## 2023-06-06 NOTE — Progress Notes (Signed)
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  Seen by Edd Fabian, NP 06/05/2023 for preop eval.  Per note, "Chart reviewed as part of pre-operative protocol coverage. Given past medical history and time since last visit, based on ACC/AHA guidelines, [name] would be at acceptable risk for the planned procedure without further cardiovascular testing. His RCRI is moderate risk, 6.6% risk of major cardiac event.  He is able to complete greater than 4 METS of physical activity. Patient was advised that if he develops new symptoms prior to surgery to contact our office to arrange a follow-up appointment.  He verbalized understanding. Patient with diagnosis of A fib on Eliquis for anticoagulation.Marland KitchenMarland KitchenRequest is to hold Eliquis for 5 days. Patient has elevated CHADS-VASc score and history of CVA.  Per Dr. Balinda Quails "Recommend either holding only 2 days or if 5 days needed then needs bridge .""  I reached out directly to Dr. Ashley Royalty to discuss hold time for Eliquis.  He advised he did not feel patient needed a full 5 days, requested patient hold for 3 days with last dose at 6 AM on 06/08/2023.  He did not feel patient would require bridging with this hold time.  I called and advised the patient of Dr. Ashley Royalty instructions.  Patient verbalized understanding.  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  He is on once weekly GLP-1 agonist, reports last dose 06/03/2023.  Preop CBC reviewed, unremarkable.  BMP hemolyzed and will need to be recollected day of surgery.  EKG 06/05/2023: NSR.  Rate 79.  TEE 11/30/2014: Normal LV EF 60% Normal atria Small PFO by color flow Normal AV Mild MR No LAA thrombus Normal RV No pericardial  effusion Mild mural aortic debris  Ok to proceed with ablations Discussed with Dr Johney Frame   [name]  Nuclear stress 11/18/2014: Overall Impression: Inferior/inferolateral defect consistent with probable soft tissue attenuation with possible mild ischemia in the mid inferolateral wall.  Overall low risk scan.     LV Ejection Fraction: 60%.  LV Wall Motion:  NL LV Function; NL Wall Motion    [name] [facility] Stay [facility]/Anesthesiology Phone [phone] 06/06/2023 12:17 PM

## 2023-06-08 DIAGNOSIS — G4733 Obstructive sleep apnea (adult) (pediatric): Secondary | ICD-10-CM | POA: Diagnosis not present

## 2023-06-08 DIAGNOSIS — I10 Essential (primary) hypertension: Secondary | ICD-10-CM | POA: Diagnosis not present

## 2023-06-11 ENCOUNTER — Other Ambulatory Visit: Payer: Self-pay

## 2023-06-11 ENCOUNTER — Ambulatory Visit (HOSPITAL_COMMUNITY): Payer: 59 | Admitting: Physician Assistant

## 2023-06-11 ENCOUNTER — Ambulatory Visit (HOSPITAL_BASED_OUTPATIENT_CLINIC_OR_DEPARTMENT_OTHER): Payer: 59 | Admitting: Physician Assistant

## 2023-06-11 ENCOUNTER — Encounter (HOSPITAL_COMMUNITY)
Admission: RE | Disposition: A | Discharge status: Home / Self Care | Payer: Self-pay | Source: Home / Self Care | Attending: Ophthalmology

## 2023-06-11 ENCOUNTER — Encounter (HOSPITAL_COMMUNITY): Payer: Self-pay | Admitting: Ophthalmology

## 2023-06-11 ENCOUNTER — Ambulatory Visit (HOSPITAL_COMMUNITY)
Admission: RE | Admit: 2023-06-11 | Discharge: 2023-06-12 | Disposition: A | Discharge status: Home / Self Care | Payer: 59 | Attending: Ophthalmology | Admitting: Ophthalmology

## 2023-06-11 ENCOUNTER — Telehealth: Payer: Self-pay

## 2023-06-11 DIAGNOSIS — Z8679 Personal history of other diseases of the circulatory system: Secondary | ICD-10-CM | POA: Diagnosis not present

## 2023-06-11 DIAGNOSIS — I2510 Atherosclerotic heart disease of native coronary artery without angina pectoris: Secondary | ICD-10-CM

## 2023-06-11 DIAGNOSIS — Z955 Presence of coronary angioplasty implant and graft: Secondary | ICD-10-CM | POA: Insufficient documentation

## 2023-06-11 DIAGNOSIS — I110 Hypertensive heart disease with heart failure: Secondary | ICD-10-CM | POA: Diagnosis not present

## 2023-06-11 DIAGNOSIS — E11319 Type 2 diabetes mellitus with unspecified diabetic retinopathy without macular edema: Secondary | ICD-10-CM | POA: Diagnosis not present

## 2023-06-11 DIAGNOSIS — I5032 Chronic diastolic (congestive) heart failure: Secondary | ICD-10-CM | POA: Diagnosis not present

## 2023-06-11 DIAGNOSIS — I4891 Unspecified atrial fibrillation: Secondary | ICD-10-CM | POA: Insufficient documentation

## 2023-06-11 DIAGNOSIS — Z87891 Personal history of nicotine dependence: Secondary | ICD-10-CM | POA: Diagnosis not present

## 2023-06-11 DIAGNOSIS — Z01818 Encounter for other preprocedural examination: Secondary | ICD-10-CM

## 2023-06-11 DIAGNOSIS — H3322 Serous retinal detachment, left eye: Secondary | ICD-10-CM

## 2023-06-11 DIAGNOSIS — I4819 Other persistent atrial fibrillation: Secondary | ICD-10-CM | POA: Diagnosis not present

## 2023-06-11 DIAGNOSIS — H33002 Unspecified retinal detachment with retinal break, left eye: Secondary | ICD-10-CM | POA: Diagnosis not present

## 2023-06-11 DIAGNOSIS — Z6837 Body mass index (BMI) 37.0-37.9, adult: Secondary | ICD-10-CM | POA: Diagnosis not present

## 2023-06-11 DIAGNOSIS — H338 Other retinal detachments: Secondary | ICD-10-CM | POA: Diagnosis not present

## 2023-06-11 DIAGNOSIS — E119 Type 2 diabetes mellitus without complications: Secondary | ICD-10-CM

## 2023-06-11 DIAGNOSIS — H348122 Central retinal vein occlusion, left eye, stable: Secondary | ICD-10-CM | POA: Diagnosis not present

## 2023-06-11 DIAGNOSIS — Z7901 Long term (current) use of anticoagulants: Secondary | ICD-10-CM | POA: Insufficient documentation

## 2023-06-11 DIAGNOSIS — G4733 Obstructive sleep apnea (adult) (pediatric): Secondary | ICD-10-CM | POA: Insufficient documentation

## 2023-06-11 DIAGNOSIS — H33012 Retinal detachment with single break, left eye: Secondary | ICD-10-CM | POA: Diagnosis not present

## 2023-06-11 HISTORY — PX: SCLERAL BUCKLE: SHX5340

## 2023-06-11 HISTORY — PX: GAS INSERTION: SHX5336

## 2023-06-11 HISTORY — PX: LASER PHOTO ABLATION: SHX5942

## 2023-06-11 LAB — BASIC METABOLIC PANEL
Anion gap: 10 (ref 5–15)
BUN: 13 mg/dL (ref 8–23)
CO2: 22 mmol/L (ref 22–32)
Calcium: 8.9 mg/dL (ref 8.9–10.3)
Chloride: 104 mmol/L (ref 98–111)
Creatinine, Ser: 0.9 mg/dL (ref 0.61–1.24)
GFR, Estimated: >60 mL/min (ref >60)
Glucose, Bld: 125 mg/dL — ABNORMAL HIGH (ref 70–99)
Potassium: 4 mmol/L (ref 3.5–5.1)
Sodium: 136 mmol/L (ref 135–145)

## 2023-06-11 LAB — GLUCOSE, CAPILLARY
Glucose-Capillary: 132 mg/dL — ABNORMAL HIGH (ref 70–99)
Glucose-Capillary: 132 mg/dL — ABNORMAL HIGH (ref 70–99)
Glucose-Capillary: 174 mg/dL — ABNORMAL HIGH (ref 70–99)
Glucose-Capillary: 165 mg/dL — ABNORMAL HIGH (ref 70–99)

## 2023-06-11 SURGERY — SCLERAL BUCKLE
Anesthesia: General | Site: Eye | Laterality: Left

## 2023-06-11 MED ORDER — DEXAMETHASONE SODIUM PHOSPHATE 10 MG/ML IJ SOLN
INTRAMUSCULAR | Status: AC
  Filled 2023-06-11: qty 1

## 2023-06-11 MED ORDER — HEMOSTATIC AGENTS (NO CHARGE) OPTIME
TOPICAL | Status: DC | PRN
Start: 2023-06-11 — End: 2023-06-11
  Administered 2023-06-11: 1 via TOPICAL

## 2023-06-11 MED ORDER — OXYCODONE HCL 5 MG PO TABS: 5.0000 mg | ORAL_TABLET | Freq: Once | ORAL | Status: AC | PRN

## 2023-06-11 MED ORDER — BSS PLUS IO SOLN
INTRAOCULAR | Status: AC
Start: 2023-06-11 — End: ?
  Filled 2023-06-11: qty 500

## 2023-06-11 MED ORDER — MAGNESIUM HYDROXIDE 400 MG/5ML PO SUSP: 15.0000 mL | Freq: Four times a day (QID) | ORAL | Status: DC | PRN

## 2023-06-11 MED ORDER — CHLORHEXIDINE GLUCONATE 0.12 % MT SOLN
15.0000 mL | Freq: Once | OROMUCOSAL | Status: AC
Start: 2023-06-11 — End: 2023-06-11
  Administered 2023-06-11: 15 mL via OROMUCOSAL
  Filled 2023-06-11: qty 15

## 2023-06-11 MED ORDER — ATROPINE SULFATE 1 % OP SOLN
OPHTHALMIC | Status: DC | PRN
  Administered 2023-06-11: 1 [drp] via OPHTHALMIC

## 2023-06-11 MED ORDER — STERILE WATER FOR INJECTION IJ SOLN
INTRAMUSCULAR | Status: DC | PRN
  Administered 2023-06-11: 20 mL

## 2023-06-11 MED ORDER — PROPOFOL 10 MG/ML IV BOLUS
INTRAVENOUS | Status: AC
Start: 2023-06-11 — End: ?
  Filled 2023-06-11: qty 20

## 2023-06-11 MED ORDER — HYDROCODONE-ACETAMINOPHEN 5-325 MG PO TABS
1.0000 | ORAL_TABLET | ORAL | Status: DC | PRN
  Administered 2023-06-11 – 2023-06-12 (×2): 1 via ORAL
  Filled 2023-06-11 (×3): qty 1

## 2023-06-11 MED ORDER — EPINEPHRINE PF 1 MG/ML IJ SOLN
INTRAMUSCULAR | Status: AC
  Filled 2023-06-11: qty 1

## 2023-06-11 MED ORDER — BSS IO SOLN
INTRAOCULAR | Status: DC | PRN
  Administered 2023-06-11: 15 mL via INTRAOCULAR

## 2023-06-11 MED ORDER — PREDNISOLONE ACETATE 1 % OP SUSP
1.0000 [drp] | Freq: Four times a day (QID) | OPHTHALMIC | Status: DC
  Filled 2023-06-11: qty 5

## 2023-06-11 MED ORDER — TROPICAMIDE 1 % OP SOLN
1.0000 [drp] | OPHTHALMIC | Status: AC | PRN
  Administered 2023-06-11 (×3): 1 [drp] via OPHTHALMIC
  Filled 2023-06-11: qty 15

## 2023-06-11 MED ORDER — SUCCINYLCHOLINE CHLORIDE 200 MG/10ML IV SOSY
PREFILLED_SYRINGE | INTRAVENOUS | Status: DC | PRN
  Administered 2023-06-11: 140 mg via INTRAVENOUS

## 2023-06-11 MED ORDER — MIDAZOLAM HCL 2 MG/2ML IJ SOLN
INTRAMUSCULAR | Status: AC
  Filled 2023-06-11: qty 2

## 2023-06-11 MED ORDER — HYALURONIDASE HUMAN 150 UNIT/ML IJ SOLN
INTRAMUSCULAR | Status: AC
  Filled 2023-06-11: qty 1

## 2023-06-11 MED ORDER — PHENYLEPHRINE HCL 2.5 % OP SOLN
1.0000 [drp] | OPHTHALMIC | Status: AC | PRN
  Administered 2023-06-11 (×3): 1 [drp] via OPHTHALMIC
  Filled 2023-06-11: qty 2

## 2023-06-11 MED ORDER — INSULIN ASPART 100 UNIT/ML IJ SOLN
0.0000 [IU] | INTRAMUSCULAR | Status: DC
  Administered 2023-06-11 – 2023-06-12 (×3): 3 [IU] via SUBCUTANEOUS

## 2023-06-11 MED ORDER — DEXAMETHASONE SODIUM PHOSPHATE 10 MG/ML IJ SOLN
INTRAMUSCULAR | Status: DC | PRN
  Administered 2023-06-11: 4 mg via INTRAVENOUS

## 2023-06-11 MED ORDER — STERILE WATER FOR INJECTION IJ SOLN
INTRAMUSCULAR | Status: AC
  Filled 2023-06-11: qty 20

## 2023-06-11 MED ORDER — BACITRACIN-POLYMYXIN B 500-10000 UNIT/GM OP OINT
TOPICAL_OINTMENT | OPHTHALMIC | Status: DC | PRN
  Administered 2023-06-11 (×2): 1 via OPHTHALMIC

## 2023-06-11 MED ORDER — CEFTAZIDIME 1 G IJ SOLR
INTRAMUSCULAR | Status: AC
  Filled 2023-06-11: qty 1

## 2023-06-11 MED ORDER — SODIUM CHLORIDE 0.9 % IV SOLN: INTRAVENOUS | Status: DC

## 2023-06-11 MED ORDER — PROPOFOL 10 MG/ML IV BOLUS
INTRAVENOUS | Status: DC | PRN
  Administered 2023-06-11: 150 mg via INTRAVENOUS

## 2023-06-11 MED ORDER — BACITRACIN-POLYMYXIN B 500-10000 UNIT/GM OP OINT
1.0000 | TOPICAL_OINTMENT | Freq: Three times a day (TID) | OPHTHALMIC | Status: DC
  Filled 2023-06-11: qty 3.5

## 2023-06-11 MED ORDER — BSS IO SOLN
INTRAOCULAR | Status: AC
  Filled 2023-06-11: qty 15

## 2023-06-11 MED ORDER — OXYCODONE HCL 5 MG/5ML PO SOLN
5.0000 mg | Freq: Once | ORAL | Status: AC | PRN
  Administered 2023-06-11: 5 mg via ORAL

## 2023-06-11 MED ORDER — ROCURONIUM BROMIDE 10 MG/ML (PF) SYRINGE
PREFILLED_SYRINGE | INTRAVENOUS | Status: DC | PRN
  Administered 2023-06-11 (×2): 20 mg via INTRAVENOUS
  Administered 2023-06-11: 30 mg via INTRAVENOUS
  Administered 2023-06-11: 10 mg via INTRAVENOUS
  Administered 2023-06-11: 50 mg via INTRAVENOUS
  Administered 2023-06-11: 30 mg via INTRAVENOUS

## 2023-06-11 MED ORDER — FENTANYL CITRATE (PF) 100 MCG/2ML IJ SOLN
25.0000 ug | INTRAMUSCULAR | Status: DC | PRN
Start: 2023-06-11 — End: 2023-06-11
  Administered 2023-06-11: 25 ug via INTRAVENOUS

## 2023-06-11 MED ORDER — GATIFLOXACIN 0.5 % OP SOLN
1.0000 [drp] | Freq: Four times a day (QID) | OPHTHALMIC | Status: DC
  Filled 2023-06-11: qty 2.5

## 2023-06-11 MED ORDER — ACETAMINOPHEN 325 MG PO TABS
325.0000 mg | ORAL_TABLET | ORAL | Status: DC | PRN
  Administered 2023-06-12: 650 mg via ORAL
  Filled 2023-06-11: qty 2

## 2023-06-11 MED ORDER — SODIUM CHLORIDE 0.45 % IV SOLN: INTRAVENOUS | Status: AC

## 2023-06-11 MED ORDER — DORZOLAMIDE HCL 2 % OP SOLN
1.0000 [drp] | Freq: Three times a day (TID) | OPHTHALMIC | Status: DC
Start: 2023-06-11 — End: 2023-06-12
  Filled 2023-06-11: qty 10

## 2023-06-11 MED ORDER — CYCLOPENTOLATE HCL 1 % OP SOLN
1.0000 [drp] | OPHTHALMIC | Status: AC | PRN
Start: 2023-06-11 — End: 2023-06-11
  Administered 2023-06-11 (×3): 1 [drp] via OPHTHALMIC
  Filled 2023-06-11: qty 2

## 2023-06-11 MED ORDER — LIDOCAINE 2% (20 MG/ML) 5 ML SYRINGE
INTRAMUSCULAR | Status: DC | PRN
  Administered 2023-06-11: 100 mg via INTRAVENOUS

## 2023-06-11 MED ORDER — OXYCODONE HCL 5 MG/5ML PO SOLN
ORAL | Status: AC
  Filled 2023-06-11: qty 5

## 2023-06-11 MED ORDER — ACETAZOLAMIDE SODIUM 500 MG IJ SOLR
INTRAMUSCULAR | Status: AC
  Filled 2023-06-11: qty 500

## 2023-06-11 MED ORDER — TETRACAINE HCL 0.5 % OP SOLN
2.0000 [drp] | Freq: Once | OPHTHALMIC | Status: DC
Start: 2023-06-12 — End: 2023-06-12
  Filled 2023-06-11: qty 4

## 2023-06-11 MED ORDER — ACETAZOLAMIDE SODIUM 500 MG IJ SOLR
500.0000 mg | Freq: Once | INTRAMUSCULAR | Status: AC
  Administered 2023-06-12: 500 mg via INTRAVENOUS
  Filled 2023-06-11 (×2): qty 500

## 2023-06-11 MED ORDER — BUPIVACAINE HCL (PF) 0.75 % IJ SOLN
INTRAMUSCULAR | Status: DC | PRN
  Administered 2023-06-11: 10 mL

## 2023-06-11 MED ORDER — BUPIVACAINE HCL (PF) 0.75 % IJ SOLN
INTRAMUSCULAR | Status: AC
  Filled 2023-06-11: qty 10

## 2023-06-11 MED ORDER — ORAL CARE MOUTH RINSE: 15.0000 mL | Freq: Once | OROMUCOSAL | Status: AC

## 2023-06-11 MED ORDER — TEMAZEPAM 7.5 MG PO CAPS
15.0000 mg | ORAL_CAPSULE | Freq: Every evening | ORAL | Status: DC | PRN
  Administered 2023-06-11: 15 mg via ORAL
  Filled 2023-06-11: qty 2

## 2023-06-11 MED ORDER — LIDOCAINE HCL 2 % IJ SOLN
INTRAMUSCULAR | Status: AC
  Filled 2023-06-11: qty 20

## 2023-06-11 MED ORDER — LATANOPROST 0.005 % OP SOLN
1.0000 [drp] | Freq: Every day | OPHTHALMIC | Status: DC
  Filled 2023-06-11: qty 2.5

## 2023-06-11 MED ORDER — DEXAMETHASONE SODIUM PHOSPHATE 10 MG/ML IJ SOLN
INTRAMUSCULAR | Status: DC | PRN
  Administered 2023-06-11: 1 mL

## 2023-06-11 MED ORDER — ACETAMINOPHEN 10 MG/ML IV SOLN
1000.0000 mg | Freq: Once | INTRAVENOUS | Status: DC | PRN
  Administered 2023-06-11: 1000 mg via INTRAVENOUS

## 2023-06-11 MED ORDER — FENTANYL CITRATE (PF) 250 MCG/5ML IJ SOLN
INTRAMUSCULAR | Status: DC | PRN
  Administered 2023-06-11: 100 ug via INTRAVENOUS
  Administered 2023-06-11 (×2): 50 ug via INTRAVENOUS

## 2023-06-11 MED ORDER — BRIMONIDINE TARTRATE 0.2 % OP SOLN
1.0000 [drp] | Freq: Two times a day (BID) | OPHTHALMIC | Status: DC
Start: 2023-06-12 — End: 2023-06-12
  Filled 2023-06-11: qty 5

## 2023-06-11 MED ORDER — FENTANYL CITRATE (PF) 100 MCG/2ML IJ SOLN
INTRAMUSCULAR | Status: AC
  Filled 2023-06-11: qty 2

## 2023-06-11 MED ORDER — CEFAZOLIN SODIUM-DEXTROSE 2-4 GM/100ML-% IV SOLN
2.0000 g | INTRAVENOUS | Status: AC
  Administered 2023-06-11: 2 g via INTRAVENOUS
  Filled 2023-06-11: qty 100

## 2023-06-11 MED ORDER — ATROPINE SULFATE 1 % OP SOLN
OPHTHALMIC | Status: AC
  Filled 2023-06-11: qty 5

## 2023-06-11 MED ORDER — POLYMYXIN B SULFATE 500000 UNITS IJ SOLR
INTRAMUSCULAR | Status: AC
  Filled 2023-06-11: qty 10

## 2023-06-11 MED ORDER — BACITRACIN-POLYMYXIN B 500-10000 UNIT/GM OP OINT
TOPICAL_OINTMENT | OPHTHALMIC | Status: AC
  Filled 2023-06-11: qty 3.5

## 2023-06-11 MED ORDER — SODIUM HYALURONATE 10 MG/ML IO SOLUTION
PREFILLED_SYRINGE | INTRAOCULAR | Status: DC | PRN
  Administered 2023-06-11: .85 mL via INTRAOCULAR

## 2023-06-11 MED ORDER — TRIAMCINOLONE ACETONIDE 40 MG/ML IJ SUSP
INTRAMUSCULAR | Status: AC
  Filled 2023-06-11: qty 5

## 2023-06-11 MED ORDER — ACETAMINOPHEN 10 MG/ML IV SOLN
INTRAVENOUS | Status: AC
  Filled 2023-06-11: qty 100

## 2023-06-11 MED ORDER — BRIMONIDINE TARTRATE 0.15 % OP SOLN
1.0000 [drp] | Freq: Two times a day (BID) | OPHTHALMIC | Status: DC
  Administered 2023-06-11: 1 [drp] via OPHTHALMIC
  Filled 2023-06-11: qty 5

## 2023-06-11 MED ORDER — SODIUM HYALURONATE 10 MG/ML IO SOLUTION
PREFILLED_SYRINGE | INTRAOCULAR | Status: AC
  Filled 2023-06-11: qty 0.85

## 2023-06-11 MED ORDER — ONDANSETRON HCL 4 MG/2ML IJ SOLN
INTRAMUSCULAR | Status: DC | PRN
  Administered 2023-06-11: 4 mg via INTRAVENOUS

## 2023-06-11 MED ORDER — GATIFLOXACIN 0.5 % OP SOLN
1.0000 [drp] | OPHTHALMIC | Status: AC | PRN
  Administered 2023-06-11 (×3): 1 [drp] via OPHTHALMIC
  Filled 2023-06-11: qty 2.5

## 2023-06-11 MED ORDER — LACTATED RINGERS IV SOLN: INTRAVENOUS | Status: DC

## 2023-06-11 MED ORDER — MORPHINE SULFATE (PF) 2 MG/ML IV SOLN: 1.0000 mg | INTRAVENOUS | Status: DC | PRN

## 2023-06-11 MED ORDER — SODIUM CHLORIDE (PF) 0.9 % IJ SOLN
INTRAMUSCULAR | Status: AC
  Filled 2023-06-11: qty 10

## 2023-06-11 MED ORDER — FENTANYL CITRATE (PF) 250 MCG/5ML IJ SOLN
INTRAMUSCULAR | Status: AC
  Filled 2023-06-11: qty 5

## 2023-06-11 MED ORDER — DROPERIDOL 2.5 MG/ML IJ SOLN: 0.6250 mg | Freq: Once | INTRAMUSCULAR | Status: DC | PRN

## 2023-06-11 MED ORDER — ONDANSETRON HCL 4 MG/2ML IJ SOLN
4.0000 mg | Freq: Four times a day (QID) | INTRAMUSCULAR | Status: DC
Start: 2023-06-11 — End: 2023-06-12
  Administered 2023-06-11 – 2023-06-12 (×2): 4 mg via INTRAVENOUS
  Filled 2023-06-11 (×2): qty 2

## 2023-06-11 MED ORDER — MIDAZOLAM HCL 2 MG/2ML IJ SOLN
INTRAMUSCULAR | Status: DC | PRN
  Administered 2023-06-11: 2 mg via INTRAVENOUS

## 2023-06-11 MED ORDER — SUGAMMADEX SODIUM 200 MG/2ML IV SOLN
INTRAVENOUS | Status: DC | PRN
  Administered 2023-06-11: 400 mg via INTRAVENOUS

## 2023-06-11 SURGICAL SUPPLY — 77 items
APPLICATOR DR MATTHEWS STRL (MISCELLANEOUS) ×12 IMPLANT
BAG COUNTER SPONGE SURGICOUNT (BAG) ×2 IMPLANT
BAND CIRCLING RETINAL 2.5 (Ophthalmic Related) IMPLANT
BAND WRIST GAS GREEN (MISCELLANEOUS) IMPLANT
BLADE EYE CATARACT 19 1.4 BEAV (BLADE) IMPLANT
BLADE MVR KNIFE 19G (BLADE) IMPLANT
BNDG EYE OVAL 2 1/8 X 2 5/8 (GAUZE/BANDAGES/DRESSINGS) IMPLANT
CANNULA ANT CHAM MAIN (OPHTHALMIC RELATED) IMPLANT
CANNULA DUAL BORE 23G (CANNULA) IMPLANT
CORD BIPOLAR FORCEPS 12FT (ELECTRODE) IMPLANT
COTTONBALL LRG STERILE PKG (GAUZE/BANDAGES/DRESSINGS) ×6 IMPLANT
COVER MAYO STAND STRL (DRAPES) IMPLANT
COVER SURGICAL LIGHT HANDLE (MISCELLANEOUS) ×2 IMPLANT
DRAPE INCISE 51X51 W/FILM STRL (DRAPES) ×2 IMPLANT
DRAPE OPHTHALMIC 77X100 STRL (CUSTOM PROCEDURE TRAY) ×2 IMPLANT
ERASER HMR WETFIELD 23G BP (MISCELLANEOUS) IMPLANT
FILTER BLUE MILLIPORE (MISCELLANEOUS) IMPLANT
FILTER STRAW FLUID ASPIR (MISCELLANEOUS) IMPLANT
FORCEPS GRIESHABER ILM 25G A (INSTRUMENTS) IMPLANT
GAS AUTO FILL CONSTEL (OPHTHALMIC) ×2
GAS AUTO FILL CONSTELLATION (OPHTHALMIC) IMPLANT
GLOVE ECLIPSE 8.5 STRL (GLOVE) ×2 IMPLANT
GLOVE SS BIOGEL STRL SZ 6.5 (GLOVE) ×2 IMPLANT
GLOVE SS BIOGEL STRL SZ 7 (GLOVE) ×2 IMPLANT
GLOVE TRIUMPH SURG SIZE 8.5 (KITS) ×2 IMPLANT
GOWN STRL REUS W/ TWL LRG LVL3 (GOWN DISPOSABLE) ×4 IMPLANT
GOWN STRL REUS W/TWL LRG LVL3 (GOWN DISPOSABLE) ×4
HANDLE PNEUMATIC FOR CONSTEL (OPHTHALMIC) IMPLANT
IMPL SILICONE (Ophthalmic Related) ×2 IMPLANT
IMPLANT SILICONE (Ophthalmic Related) ×4 IMPLANT
KIT BASIN OR (CUSTOM PROCEDURE TRAY) ×2 IMPLANT
KIT PERFLUORON PROCEDURE 5ML (MISCELLANEOUS) IMPLANT
KIT TURNOVER KIT B (KITS) ×2 IMPLANT
KNIFE CRESCENT 1.75 EDGEAHEAD (BLADE) IMPLANT
KNIFE GRIESHABER SHARP 2.5MM (MISCELLANEOUS) ×6 IMPLANT
MICROPICK 25G (MISCELLANEOUS)
NDL 18GX1X1/2 (RX/OR ONLY) (NEEDLE) ×2 IMPLANT
NDL 25GX 5/8IN NON SAFETY (NEEDLE) IMPLANT
NDL HYPO 30X.5 LL (NEEDLE) IMPLANT
NDL PRECISIONGLIDE 27X1.5 (NEEDLE) IMPLANT
NEEDLE 18GX1X1/2 (RX/OR ONLY) (NEEDLE) ×2 IMPLANT
NEEDLE 25GX 5/8IN NON SAFETY (NEEDLE) IMPLANT
NEEDLE HYPO 30X.5 LL (NEEDLE) IMPLANT
NEEDLE PRECISIONGLIDE 27X1.5 (NEEDLE) IMPLANT
NS IRRIG 1000ML POUR BTL (IV SOLUTION) ×2 IMPLANT
PACK VITRECTOMY CUSTOM (CUSTOM PROCEDURE TRAY) ×2 IMPLANT
PAD ARMBOARD 7.5X6 YLW CONV (MISCELLANEOUS) ×4 IMPLANT
PAK PIK VITRECTOMY CVS 25GA (OPHTHALMIC) IMPLANT
PIC ILLUMINATED 25G (OPHTHALMIC)
PICK MICROPICK 25G (MISCELLANEOUS) IMPLANT
PIK ILLUMINATED 25G (OPHTHALMIC) IMPLANT
PROBE LASER ILLUM FLEX CVD 25G (OPHTHALMIC) IMPLANT
REPL STRA BRUSH NDL (NEEDLE) IMPLANT
REPL STRA BRUSH NEEDLE (NEEDLE) IMPLANT
RESERVOIR BACK FLUSH (MISCELLANEOUS) IMPLANT
ROLLS DENTAL (MISCELLANEOUS) ×4 IMPLANT
SHIELD EYE LENSE ONLY DISP (GAUZE/BANDAGES/DRESSINGS) IMPLANT
SLEEVE SCLERAL TYPE 270 (Ophthalmic Related) IMPLANT
SPEAR EYE SURG WECK-CEL (MISCELLANEOUS) ×8 IMPLANT
SPONGE SURGIFOAM ABS GEL 12-7 (HEMOSTASIS) IMPLANT
STOPCOCK 4 WAY LG BORE MALE ST (IV SETS) IMPLANT
SUT CHROMIC 7 0 TG140 8 (SUTURE) ×2 IMPLANT
SUT ETHILON 9 0 TG140 8 (SUTURE) IMPLANT
SUT MERSILENE 4 0 RV 2 (SUTURE) ×4 IMPLANT
SUT SILK 2-0 18XBRD TIE 12 (SUTURE) ×2 IMPLANT
SUT SILK 4 0 RB 1 (SUTURE) ×2 IMPLANT
SYR 10ML LL (SYRINGE) IMPLANT
SYR 20ML LL LF (SYRINGE) IMPLANT
SYR 50ML LL SCALE MARK (SYRINGE) IMPLANT
SYR 5ML LL (SYRINGE) IMPLANT
SYR BULB EAR ULCER 3OZ GRN STR (SYRINGE) ×2 IMPLANT
SYR TB 1ML LUER SLIP (SYRINGE) IMPLANT
TIRE RTNL 2.5XGRV CNCV 9X (Ophthalmic Related) IMPLANT
TOWEL GREEN STERILE FF (TOWEL DISPOSABLE) ×2 IMPLANT
TUBING HIGH PRESS EXTEN 6IN (TUBING) IMPLANT
WATER STERILE IRR 1000ML POUR (IV SOLUTION) ×2 IMPLANT
WIPE INSTRUMENT VISIWIPE 73X73 (MISCELLANEOUS) ×2 IMPLANT

## 2023-06-11 NOTE — Telephone Encounter (Signed)
**Note De-Identified Kaaren Nass Obfuscation** Per the [facility] provider Portal:  Primary procedure code: [phone] Description Polysomnography; age 72 years or older, sleep staging with 4 or more additional parameters of sleep, with initiation of continuous positive airway pressure therapy or bilevel ventilation, attended by a technologist.  Prior Authorization/Notification is not required for the requested service(s).  You are not required to submit a notification/prior authorization based on the information provided.  Decision ID #: [id number]

## 2023-06-11 NOTE — Anesthesia Procedure Notes (Signed)
Procedure Name: Intubation Date/Time: 06/11/2023 12:00 PM  Performed by: Hessie Diener, CRNAPre-anesthesia Checklist: Patient identified, Emergency Drugs available, Suction available and Patient being monitored Patient Re-evaluated:Patient Re-evaluated prior to induction Oxygen Delivery Method: Circle System Utilized Preoxygenation: Pre-oxygenation with 100% oxygen Induction Type: IV induction Ventilation: Mask ventilation without difficulty Laryngoscope Size: Glidescope and 3 Grade View: Grade I Tube type: Oral Tube size: 7.5 mm Number of attempts: 1 Airway Equipment and Method: Stylet and Oral airway Placement Confirmation: ETT inserted through vocal cords under direct vision, positive ETCO2 and breath sounds checked- equal and bilateral Tube secured with: Tape Dental Injury: Teeth and Oropharynx as per pre-operative assessment

## 2023-06-11 NOTE — Transfer of Care (Signed)
Immediate Anesthesia Transfer of Care Note  Patient: [name]  Procedure(s) Performed: SCLERAL BUCKLE (Left) INSERTION OF GAS (Left: Eye) LASER PHOTO ABLATION (Left: Eye)  Patient Location: PACU  Anesthesia Type:General  Level of Consciousness: awake and patient cooperative  Airway & Oxygen Therapy: Patient Spontanous Breathing and Patient connected to face mask oxygen  Post-op Assessment: Report given to RN and Post -op Vital signs reviewed and stable  Post vital signs: Reviewed and stable  Last Vitals:  Vitals Value Taken Time  BP 156/74 06/11/23 1535  Temp    Pulse 95 06/11/23 1539  Resp 33 06/11/23 1539  SpO2 91 % 06/11/23 1539  Vitals shown include unfiled device data.  Last Pain:  Vitals:   06/11/23 0928  PainSc: 0-No pain      Patients Stated Pain Goal: 0 (06/11/23 2951)  Complications: There were no known notable events for this encounter.

## 2023-06-11 NOTE — Plan of Care (Signed)
  Problem: Education: Goal: Knowledge of General Education information will improve Description Including pain rating scale, medication(s)/side effects and non-pharmacologic comfort measures Outcome: Progressing   

## 2023-06-11 NOTE — H&P (Signed)
I examined the patient today and there is no change in the medical status 

## 2023-06-11 NOTE — Anesthesia Postprocedure Evaluation (Signed)
Anesthesia Post Note  Patient: [name]  Procedure(s) Performed: SCLERAL BUCKLE (Left) INSERTION OF GAS (Left: Eye) LASER PHOTO ABLATION (Left: Eye)     Patient location during evaluation: PACU Anesthesia Type: General Level of consciousness: awake and alert Pain management: pain level controlled Vital Signs Assessment: post-procedure vital signs reviewed and stable Respiratory status: spontaneous breathing, nonlabored ventilation, respiratory function stable and patient connected to nasal cannula oxygen Cardiovascular status: blood pressure returned to baseline and stable Postop Assessment: no apparent nausea or vomiting Anesthetic complications: no  There were no known notable events for this encounter.  Last Vitals:  Vitals:   06/11/23 1545 06/11/23 1600  BP: (!) 155/82 (!) 145/83  Pulse: 95 96  Resp: 20 14  Temp:    SpO2: 92% 90%    Last Pain:  Vitals:   06/11/23 1600  PainSc: 0-No pain                 Cailen Texeira S

## 2023-06-11 NOTE — Brief Op Note (Signed)
Brief Operative note   Preoperative diagnosis:  retinal detachment left eye Postoperative diagnosis  Rhegmatogenous retinal detachment left eye  Procedures:Scleral buckle, laser, gas injection left eye  Surgeon:  Sherrie George, MD...  Assistant:  Rosalie Doctor SA   Anesthesia: General  Specimen: none  Estimated blood loss:  1cc  Complications: none  Patient sent to PACU in good condition  Composed by Sherrie George MD  Dictation number: [id number]

## 2023-06-11 NOTE — Op Note (Unsigned)
NAMEBASTIEN, COODY MEDICAL RECORD NO: [id number] ACCOUNT NO: [id number] DATE OF BIRTH: [birth year] FACILITY: [facility] LOCATION: [facility] PHYSICIAN: Beulah Gandy. Ashley Royalty, MD  Operative Report   DATE OF PROCEDURE: 06/11/2023  ADMISSION DIAGNOSIS:  Rhegmatogenous retinal detachment, left eye.  PROCEDURE PERFORMED:  Scleral buckle left eye, gas injection left eye, retinal photocoagulation left eye.  SURGEON:  Beulah Gandy. Ashley Royalty, MD.  ASSISTANT:  Rosalie Doctor, SA  ANESTHESIA:  General  DESCRIPTION OF PROCEDURE:  After proper timeout was completed, the usual prep and drape was performed.  A 360-degree conjunctival peritomy, isolation of four rectus muscles on 2-0 silk.  Indirect ophthalmoscopy showed the retina to be detached from  approximately 1 o'clock to 9 o'clock with a large inferior bolus detachment and the macula detached.  A scleral buckle was placed by the method of Schepens.  Scleral flaps were created from 1 o'clock to 10 o'clock, 9 mm in width.  The scleral bed was  carefully fashioned to accept the scleral buckle.  Diathermy was placed in the bed.  Two sutures per quadrant for a total of seven sutures were placed in the scleral flaps.  A 279 implant was brought onto the field, inspected, and cleaned.  The posterior  2-mm edge was trimmed.  The 279 was placed around the globe.  A 240 band was placed with the scleral buckle and a 270 sleeve was used to attach the band to itself at 11 o'clock.  A perforation site was chosen at 7 o'clock in the posterior aspect of the  bed.  The sclera was incised and the diathermy was used for hemostasis of the choroid.  A blunt needle insertion was made in the external drainage and a large amount of clear subretinal fluid came forth.  The fluid was yellow in color.  No hemorrhage was  seen.  The fluid was allowed to egress slowly until all fluid was out of the eye.  The buckle was placed against the bed and the scleral sutures were drawn securely.  The free ends  were removed.  The buckle was adjusted and trimmed.  The band was  adjusted and trimmed.  Indirect ophthalmoscopy showed the retina to be lying nicely on the scleral buckle with no subretinal fluid remaining.  The indirect ophthalmoscope laser was moved into place.  424 burns were placed around the retinal periphery  with a power between 460 and 560 milliwatts, 1000 microns each and 0.1 seconds each.  0.3 mL of 100% C3F8 were injected through the pars plana at 12 o'clock to reinflate the globe.  The pressure was found to be 10 with a Barraquer tonometer multiple  times during the closing.  The scleral sutures were knotted and the free ends removed.  The conjunctiva was reposited with 7-0 chromic suture.  Polymyxin and ceftazidime were rinsed around the globe for postop pain.  Atropine solution was applied.   Marcaine was rinsed around the globe for postop pain.  Decadron 10 mg was injected into the lower subconjunctival space.  Polysporin ophthalmic ointment, a patch and shield were placed.  The patient was awakened and taken to recovery in satisfactory  condition.   PUS D: 06/11/2023 3:33:12 pm T: 06/11/2023 6:11:00 pm  JOB: [id number]/ [id number]

## 2023-06-12 ENCOUNTER — Encounter (HOSPITAL_COMMUNITY): Payer: Self-pay | Admitting: Ophthalmology

## 2023-06-12 DIAGNOSIS — G4733 Obstructive sleep apnea (adult) (pediatric): Secondary | ICD-10-CM | POA: Diagnosis not present

## 2023-06-12 DIAGNOSIS — Z8679 Personal history of other diseases of the circulatory system: Secondary | ICD-10-CM | POA: Diagnosis not present

## 2023-06-12 DIAGNOSIS — I5032 Chronic diastolic (congestive) heart failure: Secondary | ICD-10-CM | POA: Diagnosis not present

## 2023-06-12 DIAGNOSIS — H348122 Central retinal vein occlusion, left eye, stable: Secondary | ICD-10-CM | POA: Diagnosis not present

## 2023-06-12 DIAGNOSIS — Z955 Presence of coronary angioplasty implant and graft: Secondary | ICD-10-CM | POA: Diagnosis not present

## 2023-06-12 DIAGNOSIS — I2510 Atherosclerotic heart disease of native coronary artery without angina pectoris: Secondary | ICD-10-CM | POA: Diagnosis not present

## 2023-06-12 DIAGNOSIS — H33012 Retinal detachment with single break, left eye: Secondary | ICD-10-CM | POA: Diagnosis not present

## 2023-06-12 DIAGNOSIS — I110 Hypertensive heart disease with heart failure: Secondary | ICD-10-CM | POA: Diagnosis not present

## 2023-06-12 DIAGNOSIS — E11319 Type 2 diabetes mellitus with unspecified diabetic retinopathy without macular edema: Secondary | ICD-10-CM | POA: Diagnosis not present

## 2023-06-12 DIAGNOSIS — Z7901 Long term (current) use of anticoagulants: Secondary | ICD-10-CM | POA: Diagnosis not present

## 2023-06-12 DIAGNOSIS — I4891 Unspecified atrial fibrillation: Secondary | ICD-10-CM | POA: Diagnosis not present

## 2023-06-12 DIAGNOSIS — Z87891 Personal history of nicotine dependence: Secondary | ICD-10-CM | POA: Diagnosis not present

## 2023-06-12 LAB — GLUCOSE, CAPILLARY
Glucose-Capillary: 161 mg/dL — ABNORMAL HIGH (ref 70–99)
Glucose-Capillary: 164 mg/dL — ABNORMAL HIGH (ref 70–99)

## 2023-06-12 MED ORDER — STERILE WATER FOR INJECTION IJ SOLN
INTRAMUSCULAR | Status: AC
  Filled 2023-06-12: qty 10

## 2023-06-12 MED ORDER — GATIFLOXACIN 0.5 % OP SOLN: 1.0000 [drp] | Freq: Four times a day (QID) | OPHTHALMIC | Status: DC

## 2023-06-12 MED ORDER — PREDNISOLONE ACETATE 1 % OP SUSP: 1.0000 [drp] | Freq: Four times a day (QID) | OPHTHALMIC | Status: DC

## 2023-06-12 MED ORDER — BACITRACIN-POLYMYXIN B 500-10000 UNIT/GM OP OINT: 1.0000 | TOPICAL_OINTMENT | Freq: Three times a day (TID) | OPHTHALMIC | Status: DC

## 2023-06-12 NOTE — Plan of Care (Signed)

## 2023-06-12 NOTE — Plan of Care (Signed)

## 2023-06-12 NOTE — Progress Notes (Signed)
06/12/2023, 6:44 AM  Mental Status:  Awake, Alert, Oriented  Anterior segment: Cornea  Clear    Anterior Chamber Clear    Lens:   Cataract  Intra Ocular Pressure 8 mmHg with Shiotz tonometer  Vitreous: Clear 10%gas bubble   Retina:  Attached Good laser reaction   Impression: Excellent result Retina attached   Final Diagnosis: Principal Problem:   Rhegmatogenous retinal detachment of left eye   Plan: start post operative eye drops.  Discharge to home.  Give post operative instructions  Sherrie George 06/12/2023, 6:44 AM

## 2023-06-12 NOTE — Progress Notes (Signed)
Pt discharged to home accompanied by wife. All instructions and drops reviewed with wife and pt. Wife used to work at Dr. Ashley Royalty office and understands post eye care. They are going by the office and picking up an Ultram Rx per wife. PACU brought up his blue bag from downstairs so all belongings were returned prior to pt going home. Pt and wife ambulated up the hall to leave. Tylenol given for pain for pain score of 3 prior to DC.

## 2023-06-18 ENCOUNTER — Encounter (INDEPENDENT_AMBULATORY_CARE_PROVIDER_SITE_OTHER): Payer: 59 | Admitting: Ophthalmology

## 2023-06-18 DIAGNOSIS — G4733 Obstructive sleep apnea (adult) (pediatric): Secondary | ICD-10-CM | POA: Diagnosis not present

## 2023-06-18 DIAGNOSIS — H338 Other retinal detachments: Secondary | ICD-10-CM

## 2023-06-19 ENCOUNTER — Other Ambulatory Visit: Payer: Self-pay | Admitting: Cardiology

## 2023-06-19 DIAGNOSIS — I4819 Other persistent atrial fibrillation: Secondary | ICD-10-CM

## 2023-07-04 ENCOUNTER — Other Ambulatory Visit: Payer: Self-pay | Admitting: Cardiology

## 2023-07-08 DIAGNOSIS — G4733 Obstructive sleep apnea (adult) (pediatric): Secondary | ICD-10-CM | POA: Diagnosis not present

## 2023-07-08 DIAGNOSIS — I10 Essential (primary) hypertension: Secondary | ICD-10-CM | POA: Diagnosis not present

## 2023-07-09 ENCOUNTER — Encounter (INDEPENDENT_AMBULATORY_CARE_PROVIDER_SITE_OTHER): Payer: 59 | Admitting: Ophthalmology

## 2023-07-11 ENCOUNTER — Encounter (INDEPENDENT_AMBULATORY_CARE_PROVIDER_SITE_OTHER): Payer: 59 | Admitting: Ophthalmology

## 2023-07-11 DIAGNOSIS — H338 Other retinal detachments: Secondary | ICD-10-CM

## 2023-07-24 DIAGNOSIS — G4733 Obstructive sleep apnea (adult) (pediatric): Secondary | ICD-10-CM | POA: Diagnosis not present

## 2023-08-01 ENCOUNTER — Other Ambulatory Visit: Payer: Self-pay | Admitting: Medical

## 2023-08-01 NOTE — Telephone Encounter (Signed)
 Left message for pt to call me back

## 2023-08-02 ENCOUNTER — Encounter: Payer: Self-pay | Admitting: Internal Medicine

## 2023-08-05 ENCOUNTER — Telehealth: Payer: Self-pay

## 2023-08-05 NOTE — Telephone Encounter (Signed)
See refill message

## 2023-08-05 NOTE — Telephone Encounter (Signed)
 Pt called back. He is scheduled for a med/ diabetes check for 08/06/23. He is still doing B12 injection every 2 weeks at home. States he can come in the office if that is preferred.

## 2023-08-06 ENCOUNTER — Other Ambulatory Visit: Payer: Self-pay | Admitting: Medical

## 2023-08-06 ENCOUNTER — Ambulatory Visit (INDEPENDENT_AMBULATORY_CARE_PROVIDER_SITE_OTHER): Payer: 59 | Admitting: Medical

## 2023-08-06 VITALS — BP 112/70 | HR 82 | Wt 266.4 lb

## 2023-08-06 DIAGNOSIS — I2583 Coronary atherosclerosis due to lipid rich plaque: Secondary | ICD-10-CM | POA: Diagnosis not present

## 2023-08-06 DIAGNOSIS — J4520 Mild intermittent asthma, uncomplicated: Secondary | ICD-10-CM | POA: Diagnosis not present

## 2023-08-06 DIAGNOSIS — E785 Hyperlipidemia, unspecified: Secondary | ICD-10-CM

## 2023-08-06 DIAGNOSIS — I10 Essential (primary) hypertension: Secondary | ICD-10-CM | POA: Diagnosis not present

## 2023-08-06 DIAGNOSIS — E538 Deficiency of other specified B group vitamins: Secondary | ICD-10-CM

## 2023-08-06 DIAGNOSIS — E118 Type 2 diabetes mellitus with unspecified complications: Secondary | ICD-10-CM

## 2023-08-06 DIAGNOSIS — D6869 Other thrombophilia: Secondary | ICD-10-CM

## 2023-08-06 DIAGNOSIS — I2510 Atherosclerotic heart disease of native coronary artery without angina pectoris: Secondary | ICD-10-CM

## 2023-08-06 DIAGNOSIS — R0602 Shortness of breath: Secondary | ICD-10-CM

## 2023-08-06 DIAGNOSIS — G4733 Obstructive sleep apnea (adult) (pediatric): Secondary | ICD-10-CM

## 2023-08-06 DIAGNOSIS — I4819 Other persistent atrial fibrillation: Secondary | ICD-10-CM | POA: Diagnosis not present

## 2023-08-06 DIAGNOSIS — E291 Testicular hypofunction: Secondary | ICD-10-CM

## 2023-08-06 DIAGNOSIS — E1169 Type 2 diabetes mellitus with other specified complication: Secondary | ICD-10-CM

## 2023-08-06 MED ORDER — ALBUTEROL SULFATE HFA 108 (90 BASE) MCG/ACT IN AERS
2.0000 | INHALATION_SPRAY | Freq: Four times a day (QID) | RESPIRATORY_TRACT | 0 refills | Status: DC | PRN
Start: 2023-08-06 — End: 2023-08-26

## 2023-08-06 NOTE — Progress Notes (Signed)
 Subjective:  [name] is a 73 y.o. male who presents for Chief Complaint  Patient presents with   Medical Management of Chronic Issues    Med check. Discuss b12     Here for chronic disease follow up.  Asked him to come back in for B12 level as he has not had a blood test for this in a while.  Is doing self injection of B12 every 2 weeks.  He is getting ready established with [facility] for testosterone  therapy.  He was seeing [facility] but now needs to find a different provider.  He is compliant with his testosterone  topical gel  He is compliant with all of his medicines in general  He had surgery in November with Dr. Alvia.  Doing okay in this regard  He is compliant with his CPAP  Exercise is limited  He is currently pastor and full-time up in [place]   No other aggravating or relieving factors.    No other c/o.  Past Medical History:  Diagnosis Date   Atrial flutter (HCC)    a. atypical atrial flutter.   Bell's palsy    affects left side of face   CAD (coronary artery disease)    a. 07/2009 Cath: LM nl, LAD nl, LCX nl w/ L->R collats, chronically occluded RCA s/p failed PCI;  b. 07/2012 MV: basal inf mild ischemia.   Central retinal artery occlusion, left eye    diagnosed in 2008   Cerebrovascular accident [facility])    a. 03/2008 secondary to cardioembolic event;  b. chronic coumadin .   Chronic diastolic CHF (congestive heart failure) (HCC)    a. 12/2008 Echo: nl EF.   Diabetes mellitus 11/14/2010   TYPE II   Hematuria 05/2013   Urology, Dr. Alfonzo   Hyperlipidemia    Hypertension    Hypogonadism, male 05/2013   Dr. Alfonzo, Urology   Morbid obesity [facility])    Obesity    Obstructive sleep apnea    wears CPAP   Persistent atrial fibrillation [facility])    a. s/p afib ablation by [facility] 08/01/08   Recommendation refused by patient    multiple recommended vaccines refused over the years (flu/pneumovax)   Skin infection    chronic left leg herpetic   Current  Outpatient Medications on File Prior to Visit  Medication Sig Dispense Refill   Alirocumab  (PRALUENT ) 75 MG/ML SOAJ Inject 1 mL (75 mg total) into the skin every 14 (fourteen) days. 6 mL 3   amLODipine  (NORVASC ) 5 MG tablet Take 1 tablet (5 mg total) by mouth daily. 90 tablet 3   apixaban  (ELIQUIS ) 5 MG TABS tablet Take 1 tablet by mouth twice daily 180 tablet 1   Ascorbic Acid (VITAMIN C PO) Take 1 tablet by mouth every morning.     atorvastatin  (LIPITOR) 80 MG tablet Take 1 tablet by mouth once daily 90 tablet 0   b complex vitamins capsule Take 1 capsule by mouth every other day.      cyanocobalamin  (VITAMIN B12) 1000 MCG/ML injection INJECT 1 ML INTO THE MUSCLE EVERY TWO WEEKS. 30 mL 1   fluticasone  (FLONASE ) 50 MCG/ACT nasal spray Place 1 spray into both nostrils daily. (Patient taking differently: Place 1 spray into both nostrils as needed for allergies.) 16 g 0   isosorbide  mononitrate (IMDUR ) 30 MG 24 hr tablet Take 1 tablet (30 mg total) by mouth daily. 90 tablet 3   losartan  (COZAAR ) 100 MG tablet Take 1 tablet by mouth once daily 90 tablet  0   metoprolol  tartrate (LOPRESSOR ) 50 MG tablet TAKE 1 TABLET BY MOUTH TWICE DAILY (  TAKE  1  EXTRA  IF  HEART  RATE  IS  110  OR  HIGHER) 180 tablet 3   NIACIN PO Take 1 tablet by mouth daily.     omega-3 acid ethyl esters (LOVAZA ) 1 g capsule Take 2 capsules (2 g total) by mouth 2 (two) times daily. 360 capsule 3   testosterone  (ANDROGEL ) 50 MG/5GM (1%) GEL APPLY 2 PACKETS TOPICALLY ONTO THE SKIN ONCE DAILY     tirzepatide  (MOUNJARO ) 15 MG/0.5ML Pen Inject 15 mg into the skin once a week. 6 mL 3   TRESIBA  FLEXTOUCH 100 UNIT/ML FlexTouch Pen INJECT 18 UNITS SUBCUTANEOUSLY AT BEDTIME 15 mL 0   valACYclovir  (VALTREX ) 500 MG tablet Take 1 tablet by mouth once daily 90 tablet 0   vitamin B-12 (CYANOCOBALAMIN ) 1000 MCG tablet Take 1 tablet (1,000 mcg total) by mouth daily. 90 tablet 3   VITAMIN D  PO Take 1 tablet by mouth daily.     Accu-Chek Softclix  Lancets lancets Test 1-2 times daily 100 each 1   glucose blood (ACCU-CHEK GUIDE) test strip USE  STRIP TO CHECK GLUCOSE ONCE TO TWICE DAILY 100 each 1   Insulin  Pen Needle (BD PEN NEEDLE NANO 2ND GEN) 32G X 4 MM MISC USE NEEDLES AT BEDTIME 100 each 1   SYRINGE/NEEDLE, DISP, 1 ML (BD ECLIPSE SYRINGE) 25G X 5/8 1 ML MISC Use as needed for b12 injections 50 each 5   No current facility-administered medications on file prior to visit.     The following portions of the patient's history were reviewed and updated as appropriate: allergies, current medications, past family history, past medical history, past social history, past surgical history and problem list.  ROS Otherwise as in subjective above    Objective: BP 112/70   Pulse 82   Wt 266 lb 6.4 oz (120.8 kg)   BMI 38.22 kg/m   Wt Readings from Last 3 Encounters:  08/06/23 266 lb 6.4 oz (120.8 kg)  06/11/23 262 lb (118.8 kg)  06/05/23 262 lb 9.6 oz (119.1 kg)   General appearance: alert, no distress, well developed, well nourished Neck: supple, no lymphadenopathy, no thyromegaly, no masses, no JVD Heart: RRR, normal S1, S2, no murmurs Lungs: CTA bilaterally, no wheezes, rhonchi, or rales Pulses: 2+ radial pulses, 2+ pedal pulses, normal cap refill Ext: no edema    Assessment: Encounter Diagnoses  Name Primary?   B12 deficiency Yes   Mild intermittent reactive airway disease without complication    SOB (shortness of breath)    Coronary artery disease due to lipid rich plaque    Diabetes mellitus with complication (HCC)    Dyslipidemia associated with type 2 diabetes mellitus (HCC)    Hypogonadism male    Hypercoagulable state due to persistent atrial fibrillation (HCC)    OSA (obstructive sleep apnea)       Plan: History of B12 deficiency-he is doing self injection at home with B12 every 2 weeks.  Updated labs today.  We may have to back off to every 3 to 4 weeks  Mild reactive airway disease, shortness of  breath-we discussed that he may be coming down with a cold.  His wife is sick and he has a little bit decreased oxygenation today.  He has used inhaler in the past.  Begin albuterol  as needed.  If any new symptoms in the next few days suggestive of  respiratory tract infection then call back or recheck.  He has used albuterol  in the past and cold weather or with illness.  I suspect some of his shortness of breath is due to obesity and deconditioning  Coronary artery disease, hypercoagulable state-continue follow-up with cardiology, continue Eliquis   Dyslipidemia-continue atorvastatin  80 mg daily, Praluent  injectable every 2 weeks, Niacin OTC, lovaza  4 capsules daily  Diabetes marker in December 2024 was at goal.  Continue Tresiba  18 units daily, Mounjaro  15 mg weekly  OSA-continue CPAP  Obesity-I continue to recommend more exercise, healthier diet.  Discussed the benefits of losing weight.  Despite being on max dose of Mounjaro  he continues to stay around the same weight   [name] was seen today for medical management of chronic issues.  Diagnoses and all orders for this visit:  B12 deficiency -     Vitamin B12  Mild intermittent reactive airway disease without complication  SOB (shortness of breath)  Coronary artery disease due to lipid rich plaque  Diabetes mellitus with complication (HCC)  Dyslipidemia associated with type 2 diabetes mellitus (HCC)  Hypogonadism male  Hypercoagulable state due to persistent atrial fibrillation (HCC)  OSA (obstructive sleep apnea)  Other orders -     albuterol  (VENTOLIN  HFA) 108 (90 Base) MCG/ACT inhaler; Inhale 2 puffs into the lungs every 6 (six) hours as needed for wheezing or shortness of breath.    Follow up: pending labs

## 2023-08-07 LAB — VITAMIN B12: Vitamin B-12: 424 pg/mL (ref 232–1245)

## 2023-08-07 NOTE — Progress Notes (Signed)
 Results sent through MyChart

## 2023-08-26 ENCOUNTER — Other Ambulatory Visit: Payer: Self-pay | Admitting: Medical

## 2023-08-28 ENCOUNTER — Other Ambulatory Visit: Payer: Self-pay | Admitting: Medical

## 2023-08-28 DIAGNOSIS — R948 Abnormal results of function studies of other organs and systems: Secondary | ICD-10-CM | POA: Diagnosis not present

## 2023-08-28 DIAGNOSIS — E349 Endocrine disorder, unspecified: Secondary | ICD-10-CM | POA: Diagnosis not present

## 2023-09-05 ENCOUNTER — Other Ambulatory Visit: Payer: Self-pay | Admitting: Cardiology

## 2023-09-16 ENCOUNTER — Other Ambulatory Visit (HOSPITAL_COMMUNITY): Payer: Self-pay

## 2023-09-16 ENCOUNTER — Telehealth: Payer: Self-pay

## 2023-09-16 NOTE — Telephone Encounter (Signed)
 Pharmacy Patient Advocate Encounter   Received notification from Onbase that prior authorization for Praluent 75MG /ML auto-injectors  is required/requested for a formulary Exception request.   Insurance verification completed.   The patient is insured through [facility] .   Per test claim: PA required; PA submitted to above mentioned insurance via CoverMyMeds Key/confirmation #/EOC (Key: [id number])      Status is pending

## 2023-09-17 ENCOUNTER — Other Ambulatory Visit: Payer: Self-pay | Admitting: Medical

## 2023-09-17 MED ORDER — REPATHA 140 MG/ML ~~LOC~~ SOSY: 140.0000 mg | PREFILLED_SYRINGE | SUBCUTANEOUS | 11 refills | Status: DC

## 2023-09-17 NOTE — Telephone Encounter (Signed)
 I have received a request from the pts plan to do a ''Formulary Exception'' as the pts current Rx ''Praluent'' is no longer on the pts formulary and the below are preferred. Please advise

## 2023-09-18 ENCOUNTER — Encounter: Payer: Self-pay | Admitting: Internal Medicine

## 2023-09-18 DIAGNOSIS — G4733 Obstructive sleep apnea (adult) (pediatric): Secondary | ICD-10-CM | POA: Diagnosis not present

## 2023-09-18 NOTE — Telephone Encounter (Signed)
 Sent mychart message about switch

## 2023-09-19 ENCOUNTER — Other Ambulatory Visit: Payer: Self-pay | Admitting: Cardiology

## 2023-09-19 ENCOUNTER — Encounter (INDEPENDENT_AMBULATORY_CARE_PROVIDER_SITE_OTHER): Payer: 59 | Admitting: Ophthalmology

## 2023-09-19 DIAGNOSIS — I10 Essential (primary) hypertension: Secondary | ICD-10-CM

## 2023-09-19 DIAGNOSIS — H348122 Central retinal vein occlusion, left eye, stable: Secondary | ICD-10-CM | POA: Diagnosis not present

## 2023-09-19 DIAGNOSIS — H43813 Vitreous degeneration, bilateral: Secondary | ICD-10-CM

## 2023-09-19 DIAGNOSIS — H2513 Age-related nuclear cataract, bilateral: Secondary | ICD-10-CM | POA: Diagnosis not present

## 2023-09-19 DIAGNOSIS — H338 Other retinal detachments: Secondary | ICD-10-CM

## 2023-09-19 DIAGNOSIS — I4819 Other persistent atrial fibrillation: Secondary | ICD-10-CM

## 2023-09-19 DIAGNOSIS — H35033 Hypertensive retinopathy, bilateral: Secondary | ICD-10-CM | POA: Diagnosis not present

## 2023-09-19 NOTE — Telephone Encounter (Signed)
 Pt last saw Gwendolyn Lima, NP on 06/05/23, last labs 06/11/23 Creat 0.90, age 73, weight 120.8kg, based on specified criteria pt is on appropriate dosage of Eliquis 5mg  BID for afib.  Will refill rx.

## 2023-09-20 ENCOUNTER — Other Ambulatory Visit: Payer: Self-pay | Admitting: Medical

## 2023-09-21 DIAGNOSIS — I10 Essential (primary) hypertension: Secondary | ICD-10-CM | POA: Diagnosis not present

## 2023-09-21 DIAGNOSIS — G4733 Obstructive sleep apnea (adult) (pediatric): Secondary | ICD-10-CM | POA: Diagnosis not present

## 2023-10-01 ENCOUNTER — Other Ambulatory Visit: Payer: Self-pay | Admitting: Cardiology

## 2023-10-16 DIAGNOSIS — G4733 Obstructive sleep apnea (adult) (pediatric): Secondary | ICD-10-CM | POA: Diagnosis not present

## 2023-10-21 ENCOUNTER — Ambulatory Visit (HOSPITAL_COMMUNITY)
Admission: RE | Admit: 2023-10-21 | Discharge: 2023-10-21 | Disposition: A | Discharge status: Home / Self Care | Payer: 59 | Source: Ambulatory Visit | Attending: Internal Medicine | Admitting: Internal Medicine

## 2023-10-21 VITALS — BP 128/68 | HR 76 | Ht 70.0 in | Wt 271.4 lb

## 2023-10-21 DIAGNOSIS — D6869 Other thrombophilia: Secondary | ICD-10-CM | POA: Diagnosis not present

## 2023-10-21 DIAGNOSIS — Z7901 Long term (current) use of anticoagulants: Secondary | ICD-10-CM | POA: Insufficient documentation

## 2023-10-21 DIAGNOSIS — Z794 Long term (current) use of insulin: Secondary | ICD-10-CM | POA: Insufficient documentation

## 2023-10-21 DIAGNOSIS — E119 Type 2 diabetes mellitus without complications: Secondary | ICD-10-CM | POA: Insufficient documentation

## 2023-10-21 DIAGNOSIS — I4819 Other persistent atrial fibrillation: Secondary | ICD-10-CM | POA: Diagnosis not present

## 2023-10-21 DIAGNOSIS — I110 Hypertensive heart disease with heart failure: Secondary | ICD-10-CM | POA: Diagnosis not present

## 2023-10-21 DIAGNOSIS — I2510 Atherosclerotic heart disease of native coronary artery without angina pectoris: Secondary | ICD-10-CM | POA: Diagnosis not present

## 2023-10-21 DIAGNOSIS — I5032 Chronic diastolic (congestive) heart failure: Secondary | ICD-10-CM | POA: Diagnosis not present

## 2023-10-21 DIAGNOSIS — I4892 Unspecified atrial flutter: Secondary | ICD-10-CM | POA: Insufficient documentation

## 2023-10-21 DIAGNOSIS — Z8673 Personal history of transient ischemic attack (TIA), and cerebral infarction without residual deficits: Secondary | ICD-10-CM | POA: Diagnosis not present

## 2023-10-21 DIAGNOSIS — G4733 Obstructive sleep apnea (adult) (pediatric): Secondary | ICD-10-CM | POA: Diagnosis not present

## 2023-10-21 DIAGNOSIS — Z79899 Other long term (current) drug therapy: Secondary | ICD-10-CM | POA: Insufficient documentation

## 2023-10-21 NOTE — Progress Notes (Signed)
 Primary Care Physician: Jac Canavan, PA-C Referring Physician:  Altonio Schwertner is a 73 y.o. male with a h/o CAD, CVA, DM II,atrial  flutter/fib with ablations in 2010/2016. Most recent history of DCCV in ED on 01/15/23. He continues on Eliquis with a CHA2DS2VASc score of 7.  Follow up in Afib clinic, 10/21/23. He is currently in NSR. He has had no episodes of Afib since last office visit. He feels well overall and is trying to stay active. No missed doses of Eliquis.   Today, he denies symptoms of palpitations, chest pain, shortness of breath, orthopnea, PND, lower extremity edema, dizziness, presyncope, syncope, or neurologic sequela. The patient is tolerating medications without difficulties and is otherwise without complaint today.   Past Medical History:  Diagnosis Date   Atrial flutter (HCC)    a. atypical atrial flutter.   Bell's palsy    affects left side of face   CAD (coronary artery disease)    a. 07/2009 Cath: LM nl, LAD nl, LCX nl w/ L->R collats, chronically occluded RCA s/p failed PCI;  b. 07/2012 MV: basal inf mild ischemia.   Central retinal artery occlusion, left eye    diagnosed in 2008   Cerebrovascular accident [facility])    a. 03/2008 secondary to cardioembolic event;  b. chronic coumadin.   Chronic diastolic CHF (congestive heart failure) (HCC)    a. 12/2008 Echo: nl EF.   Diabetes mellitus 11/14/2010   TYPE II   Hematuria 05/2013   Urology, Dr. Lindley Magnus   Hyperlipidemia    Hypertension    Hypogonadism, male 05/2013   Dr. Lindley Magnus, Urology   Morbid obesity [facility])    Obesity    Obstructive sleep apnea    wears CPAP   Persistent atrial fibrillation (HCC)    a. s/p afib ablation by [facility] 08/01/08   Recommendation refused by patient    multiple recommended vaccines refused over the years (flu/pneumovax)   Skin infection    chronic left leg herpetic   Past Surgical History:  Procedure Laterality Date   ATRIAL ABLATION SURGERY  08/01/08   CTI and PVI ablation by [facility]    COLONOSCOPY     2012 per patient   ELECTROPHYSIOLOGIC STUDY N/A 11/30/2014   Procedure: Atrial Fibrillation Ablation;  Surgeon: Hillis Range, MD;  Location: [facility] INVASIVE CV LAB;  Service: Cardiovascular;  Laterality: N/A;   GAS INSERTION Left 06/11/2023   Procedure: INSERTION OF GAS;  Surgeon: Sherrie George, MD;  Location: [facility] OR;  Service: Ophthalmology;  Laterality: Left;   LASER PHOTO ABLATION Left 06/11/2023   Procedure: LASER PHOTO ABLATION;  Surgeon: Sherrie George, MD;  Location: [facility] OR;  Service: Ophthalmology;  Laterality: Left;   SCLERAL BUCKLE Left 06/11/2023   Procedure: SCLERAL BUCKLE;  Surgeon: Sherrie George, MD;  Location: [facility] OR;  Service: Ophthalmology;  Laterality: Left;   TEE WITHOUT CARDIOVERSION N/A 11/30/2014   Procedure: TRANSESOPHAGEAL ECHOCARDIOGRAM (TEE);  Surgeon: Wendall Stade, MD;  Location: [facility] ENDOSCOPY;  Service: Cardiovascular;  Laterality: N/A;    Current Outpatient Medications  Medication Sig Dispense Refill   Accu-Chek Softclix Lancets lancets Test 1-2 times daily 100 each 1   albuterol (VENTOLIN HFA) 108 (90 Base) MCG/ACT inhaler INHALE 2 PUFFS BY MOUTH EVERY 6 HOURS AS NEEDED FOR WHEEZING OR SHORTNESS OF BREATH 9 g 1   amLODipine (NORVASC) 5 MG tablet Take 1 tablet by mouth once daily 90 tablet 2   apixaban (ELIQUIS) 5 MG TABS tablet Take 1 tablet by  mouth twice daily 180 tablet 2   Ascorbic Acid (VITAMIN C PO) Take 1 tablet by mouth every morning.     atorvastatin (LIPITOR) 80 MG tablet Take 1 tablet by mouth once daily 90 tablet 2   b complex vitamins capsule Take 1 capsule by mouth every other day.      cyanocobalamin (VITAMIN B12) 1000 MCG/ML injection INJECT 1 ML INTO THE MUSCLE EVERY TWO WEEKS. 30 mL 1   Evolocumab (REPATHA) 140 MG/ML SOSY Inject 140 mg into the skin every 14 (fourteen) days. 2.1 mL 11   fluticasone (FLONASE) 50 MCG/ACT nasal spray Place 1 spray into both nostrils daily. (Patient taking differently: Place 1 spray into both  nostrils as needed for allergies.) 16 g 0   glucose blood (ACCU-CHEK GUIDE) test strip USE  STRIP TO CHECK GLUCOSE ONCE TO TWICE DAILY 100 each 1   Insulin Pen Needle (BD PEN NEEDLE NANO 2ND GEN) 32G X 4 MM MISC USE NEEDLES AT BEDTIME 100 each 1   isosorbide mononitrate (IMDUR) 30 MG 24 hr tablet Take 1 tablet by mouth once daily 90 tablet 3   losartan (COZAAR) 100 MG tablet Take 1 tablet by mouth once daily 90 tablet 2   metoprolol tartrate (LOPRESSOR) 50 MG tablet TAKE 1 TABLET BY MOUTH TWICE DAILY (  TAKE  1  EXTRA  IF  HEART  RATE  IS  110  OR  HIGHER) 180 tablet 3   NIACIN PO Take 1 tablet by mouth daily.     omega-3 acid ethyl esters (LOVAZA) 1 g capsule Take 2 capsules (2 g total) by mouth 2 (two) times daily. 360 capsule 3   SYRINGE/NEEDLE, DISP, 1 ML (BD ECLIPSE SYRINGE) 25G X 5/8" 1 ML MISC Use as needed for b12 injections 50 each 5   testosterone (ANDROGEL) 50 MG/5GM (1%) GEL APPLY 2 PACKETS TOPICALLY ONTO THE SKIN ONCE DAILY     tirzepatide (MOUNJARO) 15 MG/0.5ML Pen Inject 15 mg into the skin once a week. 6 mL 3   TRESIBA FLEXTOUCH 100 UNIT/ML FlexTouch Pen INJECT 18 UNITS SUBCUTANEOUSLY AT BEDTIME 15 mL 5   valACYclovir (VALTREX) 500 MG tablet Take 1 tablet by mouth once daily 90 tablet 0   vitamin B-12 (CYANOCOBALAMIN) 1000 MCG tablet Take 1 tablet (1,000 mcg total) by mouth daily. 90 tablet 3   VITAMIN D PO Take 1 tablet by mouth daily.     No current facility-administered medications for this encounter.    Allergies  Allergen Reactions   Crestor [Rosuvastatin] Other (See Comments)    Made him feel crazy   Zetia [Ezetimibe] Other (See Comments)    Made him feel crazy   Lisinopril Cough   Farxiga [Dapagliflozin] Other (See Comments)    Made heart race   Metformin And Related Other (See Comments)    Mental fog    ROS- All systems are reviewed and negative except as per the HPI above  Physical Exam: Vitals:   10/21/23 1025  BP: 128/68  Pulse: 76  Weight: 123.1 kg   Height: 5\' 10"  (1.778 m)    Wt Readings from Last 3 Encounters:  10/21/23 123.1 kg  08/06/23 120.8 kg  06/11/23 118.8 kg    Labs: Lab Results  Component Value Date   NA 136 06/11/2023   K 4.0 06/11/2023   CL 104 06/11/2023   CO2 22 06/11/2023   GLUCOSE 125 (H) 06/11/2023   BUN 13 06/11/2023   CREATININE 0.90 06/11/2023   CALCIUM 8.9  06/11/2023   MG 2.1 11/30/2020   Lab Results  Component Value Date   INR 1.3 (H) 01/15/2023   Lab Results  Component Value Date   CHOL 95 (L) 02/19/2023   HDL 46 02/19/2023   LDLCALC 30 02/19/2023   TRIG 97 02/19/2023   GEN- The patient is well appearing, alert and oriented x 3 today.   Neck - no JVD or carotid bruit noted Lungs- Clear to ausculation bilaterally, normal work of breathing Heart- Regular rate and rhythm, no murmurs, rubs or gallops, PMI not laterally displaced Extremities- no clubbing, cyanosis, or edema Skin - no rash or ecchymosis noted   EKG: Vent. rate 76 BPM PR interval 176 ms QRS duration 84 ms QT/QTcB 372/418 ms P-R-T axes 56 -6 29 Normal sinus rhythm Septal infarct , age undetermined Abnormal ECG When compared with ECG of 05-Jun-2023 12:58, PREVIOUS ECG IS PRESENT  Echo 11/18/2014-   - Left ventricle: The cavity size was normal. There was moderate    focal basal hypertrophy of the septum. Systolic function was    normal. The estimated ejection fraction was in the range of 60%    to 65%. Wall motion was normal; there were no regional wall    motion abnormalities. Doppler parameters are consistent with    pseudonormal left ventricular relaxation (grade 2 diastolic    dysfunction). The E/A ratio is 1.8, the E/e&' ratio is >15,   suggesting elevated LV filling pressure.  - Aortic valve: Trileaflet; mildly calcified leaflets.  - Mitral valve: Calcified annulus. There was trivial regurgitation.  - Left atrium: The atrium was at the upper limits of normal in    size.  - Tricuspid valve: There was moderate  regurgitation.  - Pulmonary arteries: PA peak pressure: 48 mm Hg (S).  - Inferior vena cava: The vessel was normal in size. The    respirophasic diameter changes were in the normal range (= 50%),    consistent with normal central venous pressure.   Assessment and Plan: 1. Afib/flutter  Afib ablation in 2016. S/p DCCV in ED on 01/15/23 with successful conversion to NSR.  He is currently in NSR. He is doing well overall. Continue Lopressor 50 mg BID.    2. CHA2DS2VASc score of 7 Continue Eliquis 5 mg BID.  3. OSA He uses CPAP regularly.   F/u 6 months Afib clinic.  Lake Bells, PA-C Afib Clinic [facility] [address] [place], [place] [address] [phone]

## 2023-11-04 DIAGNOSIS — I10 Essential (primary) hypertension: Secondary | ICD-10-CM | POA: Diagnosis not present

## 2023-11-04 DIAGNOSIS — G4733 Obstructive sleep apnea (adult) (pediatric): Secondary | ICD-10-CM | POA: Diagnosis not present

## 2023-11-05 DIAGNOSIS — G4733 Obstructive sleep apnea (adult) (pediatric): Secondary | ICD-10-CM | POA: Diagnosis not present

## 2023-11-05 DIAGNOSIS — I10 Essential (primary) hypertension: Secondary | ICD-10-CM | POA: Diagnosis not present

## 2023-11-16 DIAGNOSIS — G4733 Obstructive sleep apnea (adult) (pediatric): Secondary | ICD-10-CM | POA: Diagnosis not present

## 2023-11-28 ENCOUNTER — Other Ambulatory Visit: Payer: Self-pay | Admitting: Medical

## 2023-12-16 DIAGNOSIS — G4733 Obstructive sleep apnea (adult) (pediatric): Secondary | ICD-10-CM | POA: Diagnosis not present

## 2023-12-22 DIAGNOSIS — I10 Essential (primary) hypertension: Secondary | ICD-10-CM | POA: Diagnosis not present

## 2023-12-22 DIAGNOSIS — G4733 Obstructive sleep apnea (adult) (pediatric): Secondary | ICD-10-CM | POA: Diagnosis not present

## 2024-01-16 DIAGNOSIS — G4733 Obstructive sleep apnea (adult) (pediatric): Secondary | ICD-10-CM | POA: Diagnosis not present

## 2024-02-03 ENCOUNTER — Other Ambulatory Visit: Payer: Self-pay | Admitting: Medical

## 2024-02-03 NOTE — Telephone Encounter (Signed)
 Pt has an appt tomorrow

## 2024-02-04 ENCOUNTER — Ambulatory Visit (INDEPENDENT_AMBULATORY_CARE_PROVIDER_SITE_OTHER): Payer: 59 | Admitting: Medical

## 2024-02-04 ENCOUNTER — Encounter: Payer: Self-pay | Admitting: Medical

## 2024-02-04 VITALS — BP 128/78 | HR 80 | Ht 70.0 in | Wt 261.0 lb

## 2024-02-04 DIAGNOSIS — I6350 Cerebral infarction due to unspecified occlusion or stenosis of unspecified cerebral artery: Secondary | ICD-10-CM

## 2024-02-04 DIAGNOSIS — Z0000 Encounter for general adult medical examination without abnormal findings: Secondary | ICD-10-CM | POA: Insufficient documentation

## 2024-02-04 DIAGNOSIS — I2510 Atherosclerotic heart disease of native coronary artery without angina pectoris: Secondary | ICD-10-CM | POA: Diagnosis not present

## 2024-02-04 DIAGNOSIS — E785 Hyperlipidemia, unspecified: Secondary | ICD-10-CM | POA: Diagnosis not present

## 2024-02-04 DIAGNOSIS — I10 Essential (primary) hypertension: Secondary | ICD-10-CM | POA: Diagnosis not present

## 2024-02-04 DIAGNOSIS — M25562 Pain in left knee: Secondary | ICD-10-CM | POA: Diagnosis not present

## 2024-02-04 DIAGNOSIS — Z8679 Personal history of other diseases of the circulatory system: Secondary | ICD-10-CM

## 2024-02-04 DIAGNOSIS — I2583 Coronary atherosclerosis due to lipid rich plaque: Secondary | ICD-10-CM

## 2024-02-04 DIAGNOSIS — G8929 Other chronic pain: Secondary | ICD-10-CM | POA: Insufficient documentation

## 2024-02-04 DIAGNOSIS — E118 Type 2 diabetes mellitus with unspecified complications: Secondary | ICD-10-CM | POA: Diagnosis not present

## 2024-02-04 DIAGNOSIS — E1169 Type 2 diabetes mellitus with other specified complication: Secondary | ICD-10-CM | POA: Diagnosis not present

## 2024-02-04 DIAGNOSIS — G4733 Obstructive sleep apnea (adult) (pediatric): Secondary | ICD-10-CM

## 2024-02-04 DIAGNOSIS — Z125 Encounter for screening for malignant neoplasm of prostate: Secondary | ICD-10-CM

## 2024-02-04 DIAGNOSIS — E559 Vitamin D deficiency, unspecified: Secondary | ICD-10-CM

## 2024-02-04 LAB — POCT URINALYSIS DIP (PROADVANTAGE DEVICE)
Bilirubin, UA: NEGATIVE
Blood, UA: NEGATIVE
Glucose, UA: NEGATIVE mg/dL
Leukocytes, UA: NEGATIVE
Nitrite, UA: NEGATIVE
Protein Ur, POC: NEGATIVE mg/dL
Specific Gravity, Urine: 1.015
Urobilinogen, Ur: 0.2
pH, UA: 6.5 (ref 5.0–8.0)

## 2024-02-04 NOTE — Progress Notes (Signed)
 Subjective:  [name] is a 73 y.o. male who presents for Chief Complaint  Patient presents with   Annual Exam    Fasting annual exam. Left knee pain, feel likes it wants to buckle x 6-8 months. Using inserts in shoes from [employer]-helping some.      Patient Care Team: Doretha Goding, Alm RAMAN, PA-C as PCP - General (Family Medicine) Shlomo Wilbert SAUNDERS, MD as PCP - Cardiology (Cardiology) Alvia Norleen BIRCH, MD as Consulting Physician (Ophthalmology) GI- Dr. Belvie Just [facility], [facility] [place], [place]  Concerns: Here for well visit and chronic disease follow up.  Left knee giving some problems, sometimes feels like it will give out.  No injury, no trauma, no swelling  Has cataract he needs to get done of left eye.  Surgeon is planning this.  He is compliant with his testosterone  topical gel  He is compliant with all of his medicines in general  He is compliant with his CPAP  Exercise is limited  He is currently pastor  No other aggravating or relieving factors.    No other c/o.    Reviewed their medical, surgical, family, social, medication, and allergy history and updated chart as appropriate.  Allergies  Allergen Reactions   Crestor [Rosuvastatin] Other (See Comments)    Made him feel crazy   Zetia  [Ezetimibe ] Other (See Comments)    Made him feel crazy   Lisinopril  Cough   Farxiga  [Dapagliflozin ] Other (See Comments)    Made heart race   Metformin  And Related Other (See Comments)    Mental fog    Past Medical History:  Diagnosis Date   Atrial flutter (HCC)    a. atypical atrial flutter.   Bell's palsy    affects left side of face   CAD (coronary artery disease)    a. 07/2009 Cath: LM nl, LAD nl, LCX nl w/ L->R collats, chronically occluded RCA s/p failed PCI;  b. 07/2012 MV: basal inf mild ischemia.   Central retinal artery occlusion, left eye    diagnosed in 2008   Cerebrovascular accident [facility])    a. 03/2008 secondary to  cardioembolic event;  b. chronic coumadin .   Chronic diastolic CHF (congestive heart failure) (HCC)    a. 12/2008 Echo: nl EF.   Diabetes mellitus 11/14/2010   TYPE II   Hematuria 05/2013   Urology, Dr. Alfonzo   Hyperlipidemia    Hypertension    Hypogonadism, male 05/2013   Dr. Alfonzo, Urology   Morbid obesity [facility])    Obesity    Obstructive sleep apnea    wears CPAP   Persistent atrial fibrillation [facility])    a. s/p afib ablation by [facility] 08/01/08   Recommendation refused by patient    multiple recommended vaccines refused over the years (flu/pneumovax)   Skin infection    chronic left leg herpetic     Current Outpatient Medications:    Accu-Chek Softclix Lancets lancets, Test 1-2 times daily, Disp: 100 each, Rfl: 1   amLODipine  (NORVASC ) 5 MG tablet, Take 1 tablet by mouth once daily, Disp: 90 tablet, Rfl: 2   apixaban  (ELIQUIS ) 5 MG TABS tablet, Take 1 tablet by mouth twice daily, Disp: 180 tablet, Rfl: 2   Ascorbic Acid (VITAMIN C PO), Take 1 tablet by mouth every morning., Disp: , Rfl:    atorvastatin  (LIPITOR) 80 MG tablet, Take 1 tablet by mouth once daily, Disp: 90 tablet, Rfl: 2   cyanocobalamin  (VITAMIN B12) 1000 MCG/ML injection, INJECT 1 ML INTO THE  MUSCLE EVERY TWO WEEKS., Disp: 30 mL, Rfl: 1   Evolocumab  (REPATHA ) 140 MG/ML SOSY, Inject 140 mg into the skin every 14 (fourteen) days., Disp: 2.1 mL, Rfl: 11   glucose blood (ACCU-CHEK GUIDE) test strip, USE  STRIP TO CHECK GLUCOSE ONCE TO TWICE DAILY, Disp: 100 each, Rfl: 1   Insulin  Pen Needle (BD PEN NEEDLE NANO 2ND GEN) 32G X 4 MM MISC, USE NEEDLES AT BEDTIME, Disp: 100 each, Rfl: 1   isosorbide  mononitrate (IMDUR ) 30 MG 24 hr tablet, Take 1 tablet by mouth once daily, Disp: 90 tablet, Rfl: 3   losartan  (COZAAR ) 100 MG tablet, Take 1 tablet by mouth once daily, Disp: 90 tablet, Rfl: 2   metoprolol  tartrate (LOPRESSOR ) 50 MG tablet, TAKE 1 TABLET BY MOUTH TWICE DAILY (  TAKE  1  EXTRA  IF  HEART  RATE  IS  110  OR  HIGHER),  Disp: 180 tablet, Rfl: 3   NIACIN PO, Take 1 tablet by mouth daily., Disp: , Rfl:    omega-3 acid ethyl esters (LOVAZA ) 1 g capsule, Take 2 capsules (2 g total) by mouth 2 (two) times daily., Disp: 360 capsule, Rfl: 3   SYRINGE/NEEDLE, DISP, 1 ML (BD ECLIPSE SYRINGE) 25G X 5/8 1 ML MISC, Use as needed for b12 injections, Disp: 50 each, Rfl: 5   testosterone  (ANDROGEL ) 50 MG/5GM (1%) GEL, APPLY 2 PACKETS TOPICALLY ONTO THE SKIN ONCE DAILY, Disp: , Rfl:    tirzepatide  (MOUNJARO ) 15 MG/0.5ML Pen, Inject 15 mg into the skin once a week., Disp: 6 mL, Rfl: 3   TRESIBA  FLEXTOUCH 100 UNIT/ML FlexTouch Pen, INJECT 18 UNITS SUBCUTANEOUSLY AT BEDTIME, Disp: 15 mL, Rfl: 5   valACYclovir  (VALTREX ) 500 MG tablet, Take 1 tablet by mouth once daily, Disp: 90 tablet, Rfl: 0   VITAMIN D  PO, Take 1 tablet by mouth daily., Disp: , Rfl:    albuterol  (VENTOLIN  HFA) 108 (90 Base) MCG/ACT inhaler, INHALE 2 PUFFS BY MOUTH EVERY 6 HOURS AS NEEDED FOR WHEEZING OR SHORTNESS OF BREATH (Patient not taking: Reported on 02/04/2024), Disp: 9 g, Rfl: 1  Family History  Problem Relation Age of Onset   Stroke Father    Heart disease Father    Heart disease Mother     Past Surgical History:  Procedure Laterality Date   ATRIAL ABLATION SURGERY  08/01/08   CTI and PVI ablation by [facility]   COLONOSCOPY     2012 per patient   ELECTROPHYSIOLOGIC STUDY N/A 11/30/2014   Procedure: Atrial Fibrillation Ablation;  Surgeon: Lynwood Rakers, MD;  Location: [facility] INVASIVE CV LAB;  Service: Cardiovascular;  Laterality: N/A;   GAS INSERTION Left 06/11/2023   Procedure: INSERTION OF GAS;  Surgeon: Alvia Norleen BIRCH, MD;  Location: [facility] OR;  Service: Ophthalmology;  Laterality: Left;   LASER PHOTO ABLATION Left 06/11/2023   Procedure: LASER PHOTO ABLATION;  Surgeon: Alvia Norleen BIRCH, MD;  Location: [facility] OR;  Service: Ophthalmology;  Laterality: Left;   SCLERAL BUCKLE Left 06/11/2023   Procedure: SCLERAL BUCKLE;  Surgeon: Alvia Norleen BIRCH, MD;  Location: [facility]  OR;  Service: Ophthalmology;  Laterality: Left;   TEE WITHOUT CARDIOVERSION N/A 11/30/2014   Procedure: TRANSESOPHAGEAL ECHOCARDIOGRAM (TEE);  Surgeon: Maude JAYSON Emmer, MD;  Location[facility] ENDOSCOPY;  Service: Cardiovascular;  Laterality: N/A;     Review of Systems  Constitutional:  Negative for chills, fever, malaise/fatigue and weight loss.  HENT:  Negative for congestion, ear pain, hearing loss, sore throat and tinnitus.   Eyes:  Negative for blurred vision, pain and redness.       Cataract left eye  Respiratory:  Negative for cough, hemoptysis and shortness of breath.   Cardiovascular:  Negative for chest pain, palpitations, orthopnea, claudication and leg swelling.  Gastrointestinal:  Negative for abdominal pain, blood in stool, constipation, diarrhea, nausea and vomiting.  Genitourinary:  Negative for dysuria, flank pain, frequency, hematuria and urgency.  Musculoskeletal:  Positive for joint pain. Negative for falls and myalgias.  Skin:  Negative for itching and rash.  Neurological:  Negative for dizziness, tingling, speech change, weakness and headaches.  Endo/Heme/Allergies:  Negative for polydipsia. Does not bruise/bleed easily.  Psychiatric/Behavioral:  Negative for depression and memory loss. The patient is not nervous/anxious and does not have insomnia.       Objective:  BP 128/78   Pulse 80   Ht 5' 10 (1.778 m)   Wt 261 lb (118.4 kg)   SpO2 98%   BMI 37.45 kg/m   Wt Readings from Last 3 Encounters:  02/04/24 261 lb (118.4 kg)  10/21/23 271 lb 6.4 oz (123.1 kg)  08/06/23 266 lb 6.4 oz (120.8 kg)   BP Readings from Last 3 Encounters:  02/04/24 128/78  10/21/23 128/68  08/06/23 112/70    General appearance: alert, no distress, WD/WN, Caucasian male Skin: Unremarkable HEENT: normocephalic, conjunctiva/corneas normal, sclerae anicteric, PERRLA, chronic ptosis of left upper eyelid, EOMi, nares patent, no discharge or erythema, pharynx normal Oral cavity: MMM, tongue  normal, teeth in good repair Neck: supple, no lymphadenopathy, no thyromegaly, no masses, normal ROM, no bruits Chest: non tender, normal shape and expansion Heart: RRR, normal S1, S2, no murmurs Lungs: CTA bilaterally, no wheezes, rhonchi, or rales Abdomen: +bs, soft, fairly noticeable bulge of the umbilicus with umbilical hernia, reducible, otherwise non tender, non distended, no masses, no hepatomegaly, no splenomegaly, no bruits Back: non tender, normal ROM, no scoliosis Musculoskeletal: Left knee nontender without obvious swelling or laxity, range of motion normal, he does have some pain with McMurray test, otherwise upper extremities non tender, no obvious deformity, normal ROM throughout, lower extremities non tender, no obvious deformity, normal ROM throughout Extremities: no edema, no cyanosis, no clubbing Pulses: 2+ symmetric, upper and lower extremities, normal cap refill Neurological: alert, oriented x 3, CN2-12 intact, strength normal upper extremities and lower extremities, sensation normal throughout, DTRs 2+ throughout, no cerebellar signs, gait normal Psychiatric: normal affect, behavior normal, pleasant  GU/rectal - declines  Diabetic Foot Exam - Simple   Simple Foot Form Diabetic Foot exam was performed with the following findings: Yes 02/04/2024 11:32 AM  Visual Inspection See comments: Yes Sensation Testing Pulse Check Comments Somewhat hypertrophic nails, sensation slightly reduced of left toes except great toe, 1-2+ pulses in feet       Assessment and Plan :   Encounter Diagnoses  Name Primary?   Encounter for health maintenance examination in adult Yes   Chronic pain of left knee    Diabetes mellitus with complication (HCC)    Dyslipidemia associated with type 2 diabetes mellitus (HCC)    Essential hypertension, benign    Vitamin D  deficiency    OSA (obstructive sleep apnea)    History of atrial fibrillation    Coronary artery disease due to lipid rich  plaque    Cerebral artery occlusion with cerebral infarction [facility])    Screening for prostate cancer     This visit was a preventative care visit, also known as wellness visit or routine physical.   Topics  typically include healthy lifestyle, diet, exercise, preventative care, vaccinations, sick and well care, proper use of emergency dept and after hours care, as well as other concerns.     Separate significant issues discussed:  Hypertension-continue amlodipine  5 mg daily, losartan  100 mg daily, Lopressor  50 mg twice daily  History of B12 deficiency-he is doing self injection at home  Coronary artery disease, hypercoagulable state-continue follow-up with cardiology and electrophysiology, continue Eliquis  5 mg twice daily, atorvastatin  Lipitor 80 mg daily, Repatha  140 mg injection every 2 weeks  Dyslipidemia-continue atorvastatin  80 mg daily, Repatha  injectable every 2 weeks,  lovaza  4 capsules daily  Diabetes -Continue Tresiba  18 units daily, Mounjaro  15 mg weekly  OSA-continues CPAP regularly  Obesity-continue efforts to lose weight through healthy diet and exercise  Left knee pain - go for baseline xray    General Recommendations: Continue to return yearly for your annual wellness and preventative care visits.  This gives us  a chance to discuss healthy lifestyle, exercise, vaccinations, review your chart record, and perform screenings where appropriate.  I recommend you see your eye doctor yearly for routine vision care.  I recommend you see your dentist yearly for routine dental care including hygiene visits twice yearly.   Vaccination   There is no immunization history on file for this patient.  Vaccine recommendations: Yearly flu shot, shingrix, prevnar pneumococcal, tdap  Vaccines administered today: You decline vaccines   Screening for cancer: Colon cancer screening: Prior or last colon cancer screen: 2018, due now.  Declines for now.   Prostate Cancer  screening: The recommended prostate cancer screening test is a blood test called the prostate-specific antigen (PSA) test. PSA is a protein that is made in the prostate. As you age, your prostate naturally produces more PSA. Abnormally high PSA levels may be caused by: Prostate cancer. An enlarged prostate that is not caused by cancer (benign prostatic hyperplasia, or BPH). This condition is very common in older men. A prostate gland infection (prostatitis) or urinary tract infection. Certain medicines such as male hormones (like testosterone ) or other medicines that raise testosterone  levels. A rectal exam may be done as part of prostate cancer screening to help provide information about the size of your prostate gland. When a rectal exam is performed, it should be done after the PSA level is drawn to avoid any effect on the results.   Skin cancer screening: Check your skin regularly for new changes, growing lesions, or other lesions of concern Come in for evaluation if you have skin lesions of concern.   Lung cancer screening: If you have a greater than 20 pack year history of tobacco use, then you may qualify for lung cancer screening with a chest CT scan.   Please call your insurance company to inquire about coverage for this test.   Pancreatic cancer:  no current screening test is available or routinely recommended. (risk factors: smoking, overweight or obese, diabetes, chronic pancreatitis, work exposure - dry cleaning, metal working, 73yo>, M>F, Tree surgeon, family hx/o, hereditary breast, ovarian, melanoma, lynch, peutz-jeghers).  Symptoms: jaundice, dark urine, light color or greasy stools, itchy skin, belly or back pain, weight loss, poor appetite, nause, vomiting, liver enlargement, DVT/blood clots.   We currently don't have screenings for other cancers besides breast, cervical, colon, and lung cancers.  If you have a strong family history of cancer or have other cancer screening  concerns, please let me know.  Genetic testing referral is an option for individuals with high cancer risk in  the family.  There are some other cancer screenings in development currently.   Bone health: Get at least 150 minutes of aerobic exercise weekly Get weight bearing exercise at least once weekly Bone density test:  A bone density test is an imaging test that uses a type of X-ray to measure the amount of calcium  and other minerals in your bones. The test may be used to diagnose or screen you for a condition that causes weak or thin bones (osteoporosis), predict your risk for a broken bone (fracture), or determine how well your osteoporosis treatment is working. The bone density test is recommended for females 65 and older, or females or males <65 if certain risk factors such as thyroid disease, long term use of steroids such as for asthma or rheumatological issues, vitamin D  deficiency, estrogen deficiency, family history of osteoporosis, self or family history of fragility fracture in first degree relative.    Heart health: Get at least 150 minutes of aerobic exercise weekly Limit alcohol It is important to maintain a healthy blood pressure and healthy cholesterol numbers  Heart disease screening: Screening for heart disease includes screening for blood pressure, fasting lipids, glucose/diabetes screening, BMI height to weight ratio, reviewed of smoking status, physical activity, and diet.    Goals include blood pressure 120/80 or less, maintaining a healthy lipid/cholesterol profile, preventing diabetes or keeping diabetes numbers under good control, not smoking or using tobacco products, exercising most days per week or at least 150 minutes per week of exercise, and eating healthy variety of fruits and vegetables, healthy oils, and avoiding unhealthy food choices like fried food, fast food, high sugar and high cholesterol foods.    Other tests may possibly include EKG test, CT  coronary calcium  score, echocardiogram, exercise treadmill stress test.     Heart Disease Testing completed: Echocardiogram from 2016 and stress test from 2016 reviewed  I recommend routine follow up with Dr. Shlomo, cardiology   Vascular disease screening: For higher risk individuals including smokers, diabetics, patients with known heart disease or high blood pressure, kidney disease, and others, screening for vascular disease or atherosclerosis of the arteries is available.  Examples may include carotid ultrasound, abdominal aortic ultrasound, ABI blood flow screening in the legs, thoracic aorta screening.  I recommend a baseline ABI blood flow screen in legs    Medical care options: I recommend you continue to seek care here first for routine care.  We try really hard to have available appointments Monday through Friday daytime hours for sick visits, acute visits, and physicals.  Urgent care should be used for after hours and weekends for significant issues that cannot wait till the next day.  The emergency department should be used for significant potentially life-threatening emergencies.  The emergency department is expensive, can often have long wait times for less significant concerns, so try to utilize primary care, urgent care, or telemedicine when possible to avoid unnecessary trips to the emergency department.  Virtual visits and telemedicine have been introduced since the pandemic started in 2020, and can be convenient ways to receive medical care.  We offer virtual appointments as well to assist you in a variety of options to seek medical care.   Legal  Take the time to do a last will and testament, Advanced Directives including Health Care Power of Attorney and Living Will documents.  Don't leave your family with burdens that can be handled ahead of time.   Advanced Directives: I recommend you consider completing a Health Care  Power of [employer] and Living Will.   These  documents respect your wishes and help alleviate burdens on your loved ones if you were to become terminally ill or be in a position to need those documents enforced.    You can complete Advanced Directives yourself, have them notarized, then have copies made for our office, for you and for anybody you feel should have them in safe keeping.  Or, you can have an attorney prepare these documents.   If you haven't updated your Last Will and Testament in a while, it may be worthwhile having an attorney prepare these documents together and save on some costs.        Ananth was seen today for annual exam.  Diagnoses and all orders for this visit:  Encounter for health maintenance examination in adult -     Comprehensive metabolic panel with GFR -     CBC -     Lipid panel -     TSH -     PSA -     Hemoglobin A1c -     Microalbumin/Creatinine Ratio, Urine -     POCT Urinalysis DIP (Proadvantage Device)  Chronic pain of left knee -     DG Knee Complete 4 Views Left; Future  Diabetes mellitus with complication (HCC) -     Hemoglobin A1c -     Microalbumin/Creatinine Ratio, Urine  Dyslipidemia associated with type 2 diabetes mellitus (HCC) -     Lipid panel  Essential hypertension, benign  Vitamin D  deficiency  OSA (obstructive sleep apnea)  History of atrial fibrillation  Coronary artery disease due to lipid rich plaque  Cerebral artery occlusion with cerebral infarction [facility])  Screening for prostate cancer -     PSA     Follow-up pending labs, yearly for physical

## 2024-02-04 NOTE — Patient Instructions (Signed)
 Please go to [facility] for your knee xray.   Their hours are 8am - 4:30 pm Monday - Friday.  Take your insurance card with you.  [facility] [phone]   [address] [place], [place] [address]

## 2024-02-05 ENCOUNTER — Ambulatory Visit: Payer: Self-pay | Admitting: Medical

## 2024-02-06 LAB — COMPREHENSIVE METABOLIC PANEL WITH GFR
ALT: 14 IU/L (ref 0–44)
AST: 17 IU/L (ref 0–40)
Albumin: 4.4 g/dL (ref 3.8–4.8)
Alkaline Phosphatase: 101 IU/L (ref 44–121)
BUN/Creatinine Ratio: 17 (ref 10–24)
BUN: 15 mg/dL (ref 8–27)
Bilirubin Total: 0.6 mg/dL (ref 0.0–1.2)
CO2: 19 mmol/L — ABNORMAL LOW (ref 20–29)
Calcium: 9 mg/dL (ref 8.6–10.2)
Chloride: 105 mmol/L (ref 96–106)
Creatinine, Ser: 0.86 mg/dL (ref 0.76–1.27)
Globulin, Total: 2.7 g/dL (ref 1.5–4.5)
Glucose: 96 mg/dL (ref 70–99)
Potassium: 4.5 mmol/L (ref 3.5–5.2)
Sodium: 139 mmol/L (ref 134–144)
Total Protein: 7.1 g/dL (ref 6.0–8.5)
eGFR: 91 mL/min/1.73 (ref >59)

## 2024-02-06 LAB — MICROALBUMIN / CREATININE URINE RATIO
Creatinine, Urine: 188.8 mg/dL
Microalb/Creat Ratio: 9 mg/g{creat} (ref 0–29)
Microalbumin, Urine: 17.8 ug/mL

## 2024-02-06 LAB — CBC
Hematocrit: 46.9 % (ref 37.5–51.0)
Hemoglobin: 14.6 g/dL (ref 13.0–17.7)
MCH: 26.1 pg — ABNORMAL LOW (ref 26.6–33.0)
MCHC: 31.1 g/dL — ABNORMAL LOW (ref 31.5–35.7)
MCV: 84 fL (ref 79–97)
Platelets: 248 x10E3/uL (ref 150–450)
RBC: 5.59 x10E6/uL (ref 4.14–5.80)
RDW: 15.8 % — ABNORMAL HIGH (ref 11.6–15.4)
WBC: 6.2 x10E3/uL (ref 3.4–10.8)

## 2024-02-06 LAB — LIPID PANEL
Chol/HDL Ratio: 2.5 ratio (ref 0.0–5.0)
Cholesterol, Total: 96 mg/dL — ABNORMAL LOW (ref 100–199)
HDL: 38 mg/dL — ABNORMAL LOW (ref >39)
LDL Chol Calc (NIH): 42 mg/dL (ref 0–99)
Triglycerides: 76 mg/dL (ref 0–149)
VLDL Cholesterol Cal: 16 mg/dL (ref 5–40)

## 2024-02-06 LAB — HEMOGLOBIN A1C
Est. average glucose Bld gHb Est-mCnc: 143 mg/dL
Hgb A1c MFr Bld: 6.6 % — ABNORMAL HIGH (ref 4.8–5.6)

## 2024-02-06 LAB — TSH: TSH: 2.06 u[IU]/mL (ref 0.450–4.500)

## 2024-02-06 LAB — PSA: Prostate Specific Ag, Serum: 0.4 ng/mL (ref 0.0–4.0)

## 2024-02-07 ENCOUNTER — Other Ambulatory Visit: Payer: Self-pay | Admitting: Medical

## 2024-02-07 MED ORDER — BD PEN NEEDLE NANO 2ND GEN 32G X 4 MM MISC: 1 refills | Status: DC

## 2024-02-07 MED ORDER — CYANOCOBALAMIN 1000 MCG/ML IJ SOLN: 1000.0000 ug | INTRAMUSCULAR | 11 refills | Status: AC

## 2024-02-07 MED ORDER — TIRZEPATIDE 15 MG/0.5ML ~~LOC~~ SOAJ: 15.0000 mg | SUBCUTANEOUS | 5 refills | Status: DC

## 2024-02-07 MED ORDER — ALBUTEROL SULFATE HFA 108 (90 BASE) MCG/ACT IN AERS: 1.0000 | INHALATION_SPRAY | Freq: Four times a day (QID) | RESPIRATORY_TRACT | 0 refills | Status: DC | PRN

## 2024-02-07 MED ORDER — TRESIBA FLEXTOUCH 100 UNIT/ML ~~LOC~~ SOPN: 18.0000 [IU] | PEN_INJECTOR | Freq: Every day | SUBCUTANEOUS | 5 refills | Status: DC

## 2024-02-07 MED ORDER — OMEGA-3-ACID ETHYL ESTERS 1 G PO CAPS: 2.0000 | ORAL_CAPSULE | Freq: Two times a day (BID) | ORAL | 3 refills | Status: DC

## 2024-02-07 NOTE — Progress Notes (Signed)
 Liver, kidney and electrolytes normal, blood counts ok, cholesterol ok, diabetes marker stable at 6.6%, thyroid ok, prostate marker ok  Continue current medicaiton  Avoid eating late in the evening, consider cutting out most bread/rice/pasta for a month as well as sweets/sugar for a month to see if you can stimulate some weight loss  Do eat fruits and vegetables daily, lean small portions of meat  Consider recheck in 1 month with nurse for weight check

## 2024-02-10 DIAGNOSIS — I10 Essential (primary) hypertension: Secondary | ICD-10-CM | POA: Diagnosis not present

## 2024-02-10 DIAGNOSIS — G4733 Obstructive sleep apnea (adult) (pediatric): Secondary | ICD-10-CM | POA: Diagnosis not present

## 2024-02-15 DIAGNOSIS — G4733 Obstructive sleep apnea (adult) (pediatric): Secondary | ICD-10-CM | POA: Diagnosis not present

## 2024-02-25 DIAGNOSIS — E349 Endocrine disorder, unspecified: Secondary | ICD-10-CM | POA: Diagnosis not present

## 2024-02-26 ENCOUNTER — Other Ambulatory Visit: Payer: Self-pay | Admitting: Medical

## 2024-03-03 DIAGNOSIS — E349 Endocrine disorder, unspecified: Secondary | ICD-10-CM | POA: Diagnosis not present

## 2024-03-09 ENCOUNTER — Other Ambulatory Visit

## 2024-03-09 ENCOUNTER — Telehealth: Payer: Self-pay | Admitting: *Deleted

## 2024-03-09 NOTE — Telephone Encounter (Signed)
 Patient was in today for weight check nurse visit. 260.8lbs

## 2024-03-09 NOTE — Telephone Encounter (Signed)
 Patient advised.

## 2024-03-19 ENCOUNTER — Encounter (INDEPENDENT_AMBULATORY_CARE_PROVIDER_SITE_OTHER): Payer: 59 | Admitting: Ophthalmology

## 2024-03-19 DIAGNOSIS — H338 Other retinal detachments: Secondary | ICD-10-CM | POA: Diagnosis not present

## 2024-03-19 DIAGNOSIS — H35033 Hypertensive retinopathy, bilateral: Secondary | ICD-10-CM | POA: Diagnosis not present

## 2024-03-19 DIAGNOSIS — H348122 Central retinal vein occlusion, left eye, stable: Secondary | ICD-10-CM | POA: Diagnosis not present

## 2024-03-19 DIAGNOSIS — I10 Essential (primary) hypertension: Secondary | ICD-10-CM | POA: Diagnosis not present

## 2024-03-19 DIAGNOSIS — H43813 Vitreous degeneration, bilateral: Secondary | ICD-10-CM | POA: Diagnosis not present

## 2024-03-23 DIAGNOSIS — G4733 Obstructive sleep apnea (adult) (pediatric): Secondary | ICD-10-CM | POA: Diagnosis not present

## 2024-03-23 DIAGNOSIS — I10 Essential (primary) hypertension: Secondary | ICD-10-CM | POA: Diagnosis not present

## 2024-03-25 NOTE — Telephone Encounter (Signed)
**Note De-Identified [name] Obfuscation** Per notes from the sleep lab: Per patient's wife study has been done at another sleep lab 06/14/23 TP.   I have canceled the order.

## 2024-04-03 ENCOUNTER — Emergency Department (HOSPITAL_BASED_OUTPATIENT_CLINIC_OR_DEPARTMENT_OTHER)
Admission: EM | Admit: 2024-04-03 | Discharge: 2024-04-03 | Disposition: A | Discharge status: Home / Self Care | Source: Ambulatory Visit | Attending: Emergency Medicine | Admitting: Emergency Medicine

## 2024-04-03 ENCOUNTER — Ambulatory Visit: Payer: Self-pay

## 2024-04-03 ENCOUNTER — Other Ambulatory Visit: Payer: Self-pay

## 2024-04-03 ENCOUNTER — Other Ambulatory Visit: Payer: Self-pay | Admitting: Cardiology

## 2024-04-03 ENCOUNTER — Encounter (HOSPITAL_BASED_OUTPATIENT_CLINIC_OR_DEPARTMENT_OTHER): Payer: Self-pay

## 2024-04-03 DIAGNOSIS — H5789 Other specified disorders of eye and adnexa: Secondary | ICD-10-CM | POA: Diagnosis not present

## 2024-04-03 DIAGNOSIS — Z794 Long term (current) use of insulin: Secondary | ICD-10-CM | POA: Diagnosis not present

## 2024-04-03 DIAGNOSIS — Z79899 Other long term (current) drug therapy: Secondary | ICD-10-CM | POA: Insufficient documentation

## 2024-04-03 DIAGNOSIS — Z7901 Long term (current) use of anticoagulants: Secondary | ICD-10-CM | POA: Insufficient documentation

## 2024-04-03 DIAGNOSIS — H209 Unspecified iridocyclitis: Secondary | ICD-10-CM | POA: Diagnosis not present

## 2024-04-03 DIAGNOSIS — H5711 Ocular pain, right eye: Secondary | ICD-10-CM | POA: Diagnosis not present

## 2024-04-03 MED ORDER — PREDNISOLONE ACETATE 1 % OP SUSP
1.0000 [drp] | Freq: Four times a day (QID) | OPHTHALMIC | Status: DC
  Administered 2024-04-03: 1 [drp] via OPHTHALMIC

## 2024-04-03 MED ORDER — FLUORESCEIN SODIUM 1 MG OP STRP
1.0000 | ORAL_STRIP | Freq: Once | OPHTHALMIC | Status: AC
  Administered 2024-04-03: 1 via OPHTHALMIC
  Filled 2024-04-03: qty 1

## 2024-04-03 MED ORDER — FLUORESCEIN SODIUM 1 MG OP STRP
ORAL_STRIP | OPHTHALMIC | Status: AC
  Filled 2024-04-03: qty 1

## 2024-04-03 MED ORDER — TETRACAINE HCL 0.5 % OP SOLN
2.0000 [drp] | Freq: Once | OPHTHALMIC | Status: AC
  Administered 2024-04-03: 2 [drp] via OPHTHALMIC
  Filled 2024-04-03: qty 4

## 2024-04-03 MED ORDER — CYCLOPENTOLATE HCL 1 % OP SOLN
1.0000 [drp] | Freq: Three times a day (TID) | OPHTHALMIC | Status: DC
  Filled 2024-04-03: qty 2

## 2024-04-03 NOTE — ED Triage Notes (Signed)
 Pt reports R eye pain starting yesterday. Pt reports throbbing and sensitivity to light. Pt denies any possibility of foreign object in eye.

## 2024-04-03 NOTE — Discharge Instructions (Signed)
 As we discussed, we spoke with Dr. Jacques from Dr. Alvia' office who will follow up with your tomorrow by phone.  Use the drops as instructed:  Cyclogel 1 drop every 8 hours Pred Forte  1 drop every 6 hours  Return to the ED if your symptoms worsen or for new concern.

## 2024-04-03 NOTE — ED Provider Notes (Signed)
 [place] EMERGENCY DEPARTMENT AT [facility] Provider Note   CSN: [id number] Arrival date & time: 04/03/24  1636     Patient presents with: Eye Pain   [name] is a 73 y.o. male.  {Add pertinent medical, surgical, social history, OB history to YEP:67052} Patient to ED with right eye pain. Symptoms started yesterday with redness and irritation, this morning with more significant eye pain. He reports blurred vision. No purulent drainage. No injury. He does not feel there is anything in the eye. No headache.   The history is provided by the patient and the spouse. No language interpreter was used.  Eye Pain       Prior to Admission medications   Medication Sig Start Date End Date Taking? Authorizing Provider  Accu-Chek Softclix Lancets lancets Test 1-2 times daily 02/20/23   Tysinger, Alm RAMAN, PA-C  albuterol  (VENTOLIN  HFA) 108 (90 Base) MCG/ACT inhaler Inhale 1-2 puffs into the lungs every 6 (six) hours as needed for wheezing or shortness of breath. 02/07/24   Tysinger, Alm RAMAN, PA-C  amLODipine  (NORVASC ) 5 MG tablet Take 1 tablet by mouth once daily 04/03/24   Shlomo Wilbert SAUNDERS, MD  apixaban  (ELIQUIS ) 5 MG TABS tablet Take 1 tablet by mouth twice daily 09/19/23   Turner, Traci R, MD  Ascorbic Acid (VITAMIN C PO) Take 1 tablet by mouth every morning.    [provider]  atorvastatin  (LIPITOR) 80 MG tablet Take 1 tablet by mouth once daily 10/01/23   Turner, Wilbert SAUNDERS, MD  cyanocobalamin  (VITAMIN B12) 1000 MCG/ML injection Inject 1 mL (1,000 mcg total) into the muscle every 30 (thirty) days. 02/07/24   Tysinger, Alm RAMAN, PA-C  Evolocumab  (REPATHA ) 140 MG/ML SOSY Inject 140 mg into the skin every 14 (fourteen) days. 09/17/23   Tysinger, Alm RAMAN, PA-C  glucose blood (ACCU-CHEK GUIDE) test strip USE  STRIP TO CHECK GLUCOSE ONCE TO TWICE DAILY 02/25/23   Tysinger, Alm RAMAN, PA-C  Insulin  Pen Needle (BD PEN NEEDLE NANO 2ND GEN) 32G X 4 MM MISC USE  NEEDLES AT BEDTIME 02/07/24    Tysinger, Alm RAMAN, PA-C  isosorbide  mononitrate (IMDUR ) 30 MG 24 hr tablet Take 1 tablet by mouth once daily 09/05/23   Shlomo Wilbert SAUNDERS, MD  losartan  (COZAAR ) 100 MG tablet Take 1 tablet by mouth once daily 10/01/23   Shlomo Wilbert SAUNDERS, MD  metoprolol  tartrate (LOPRESSOR ) 50 MG tablet TAKE 1 TABLET BY MOUTH TWICE DAILY (  TAKE  1  EXTRA  IF  HEART  RATE  IS  110  OR  HIGHER) 06/19/23   Shlomo Wilbert SAUNDERS, MD  NIACIN PO Take 1 tablet by mouth daily.    [provider]  omega-3 acid ethyl esters (LOVAZA ) 1 g capsule Take 2 capsules (2 g total) by mouth 2 (two) times daily. 02/07/24   Tysinger, Alm RAMAN, PA-C  SYRINGE/NEEDLE, DISP, 1 ML (BD ECLIPSE SYRINGE) 25G X 5/8 1 ML MISC Use as needed for b12 injections 02/01/22   Tysinger, Alm RAMAN, PA-C  testosterone  (ANDROGEL ) 50 MG/5GM (1%) GEL APPLY 2 PACKETS TOPICALLY ONTO THE SKIN ONCE DAILY 10/22/20   [provider]  tirzepatide  (MOUNJARO ) 15 MG/0.5ML Pen Inject 15 mg into the skin once a week. 02/07/24   Tysinger, Alm RAMAN, PA-C  TRESIBA  FLEXTOUCH 100 UNIT/ML FlexTouch Pen Inject 18 Units into the skin daily. 02/07/24   Tysinger, Alm RAMAN, PA-C  valACYclovir  (VALTREX ) 500 MG tablet Take 1 tablet by mouth once daily 02/26/24  Tysinger, Alm RAMAN, PA-C  VITAMIN D  PO Take 1 tablet by mouth daily.    [provider]    Allergies: Crestor [rosuvastatin], Zetia  [ezetimibe ], Lisinopril , Farxiga  [dapagliflozin ], and Metformin  and related    Review of Systems  Eyes:  Positive for pain.    Updated Vital Signs BP 120/74 (BP Location: Right Arm)   Pulse 82   Temp 97.9 F (36.6 C) (Temporal)   Resp 18   Ht 5' 10 (1.778 m)   Wt 117.9 kg   SpO2 95%   BMI 37.31 kg/m   Physical Exam Vitals and nursing note reviewed.  Constitutional:      Appearance: Normal appearance. He is obese.  Eyes:     General: Lids are normal. Gaze aligned appropriately.        Right eye: No discharge.     Intraocular pressure: Right eye pressure is 18 mmHg.      Extraocular Movements: Extraocular movements intact.     Right eye: No nystagmus.     Conjunctiva/sclera:     Right eye: Right conjunctiva is injected. No chemosis, exudate or hemorrhage.    Comments: Cornea clear. Fluorescein  stain negative for uptake. Pressure 18, 19. Globe soft and nontender.   Neurological:     Mental Status: He is alert.     (all labs ordered are listed, but only abnormal results are displayed) Labs Reviewed - No data to display  EKG: None  Radiology: No results found.  {Document cardiac monitor, telemetry assessment procedure when appropriate:32947} Procedures   Medications Ordered in the ED  tetracaine  (PONTOCAINE) 0.5 % ophthalmic solution 2 drop (2 drops Right Eye Given by Other 04/03/24 1841)  fluorescein  ophthalmic strip 1 strip (1 strip Right Eye Given by Other 04/03/24 1842)    Clinical Course as of 04/03/24 1851  Fri Apr 03, 2024  1847 Patient to ED with right eye pain, some blurred vision. Exam is reassuring. Globe soft NT. Pressures normal. PERRL. 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   Dr. Yolande to see and evaluate.  [SU]    Clinical Course User Index [SU] Odell Balls, PA-C   {Click here for ABCD2, HEART and other calculators REFRESH Note before signing:1}                              Medical Decision Making Risk Prescription drug management.   ***  {Document critical care time when appropriate  Document review of labs and clinical decision tools ie CHADS2VASC2, etc  Document your independent review of radiology images and any outside records  Document your discussion with family members, caretakers and with consultants  Document social determinants of health affecting pt's care  Document your decision making why or why not admission, treatments were needed:32947:::1}   Final diagnoses:  None    ED Discharge Orders     None

## 2024-04-03 NOTE — Telephone Encounter (Signed)
 FYI Only or Action Required?: FYI only for provider.  Patient was last seen in primary care on 02/04/2024 by Bulah Alm RAMAN, PA-C.  Called Nurse Triage reporting Eye Pain.  Symptoms began yesterday.  Interventions attempted: Nothing.  Symptoms are: gradually worsening.  Triage Disposition: See HCP Within 4 Hours (Or PCP Triage)  Patient/caregiver understands and will follow disposition?: Yes     Copied from CRM #[id number]. Topic: Clinical - Red Word Triage >> Apr 03, 2024  4:14 PM Wess RAMAN wrote: Red Word that prompted transfer to Nurse Triage: Patient has a painful right, red eye. Eye doctor is closed. Patient's wife stated it feel like a stabbing pain. Reason for Disposition  Looking at light causes MODERATE to SEVERE eye pain (i.e., photophobia)  Answer Assessment - Initial Assessment Questions This RN spoke with patient's wife Nathanel for symptoms. Redness started last night in eye after rubbing it. Pt states it hurts when his eye is open. This RN offered to make an appointment at [facility] today for symptoms. Information provided for [facility] at [facility] and patient's wife also told he can walk in for treatment. Patient states he prefers to go to the ED at [facility] for symptoms. Patient and patient's wife states they are on the way to ED.   1. ONSET: When did the pain start? (e.g., minutes, hours, days)     Today  2. TIMING: Does the pain come and go, or has it been constant since it started? (e.g., constant, intermittent, fleeting)     Comes and goes  3. SEVERITY: How bad is the pain?  (Scale 1-10; mild, moderate or severe)     Stabbing pain  4. LOCATION: Where does it hurt?  (e.g., eyelid, eye, cheekbone)     R Eye  5. CAUSE: What do you think is causing the pain?     Possible corneal abrasion   6. VISION: Do you have blurred vision or changes in your vision?      Blurry vision and states looking at light makes symptoms worse 7. EYE DISCHARGE:  Is there any discharge (pus) from the eye(s)?  If Yes, ask: What color is it?      Eye discharge  9. OTHER SYMPTOMS: Do you have any other symptoms? (e.g., headache, nasal discharge, facial rash)     Denies  Protocols used: Eye Pain and Other Symptoms-A-AH

## 2024-04-07 DIAGNOSIS — H20011 Primary iridocyclitis, right eye: Secondary | ICD-10-CM | POA: Diagnosis not present

## 2024-04-14 DIAGNOSIS — H20011 Primary iridocyclitis, right eye: Secondary | ICD-10-CM | POA: Diagnosis not present

## 2024-04-20 ENCOUNTER — Encounter (HOSPITAL_COMMUNITY): Payer: Self-pay | Admitting: Internal Medicine

## 2024-04-20 ENCOUNTER — Ambulatory Visit (HOSPITAL_COMMUNITY)
Admission: RE | Admit: 2024-04-20 | Discharge: 2024-04-20 | Disposition: A | Discharge status: Home / Self Care | Source: Ambulatory Visit | Attending: Internal Medicine | Admitting: Internal Medicine

## 2024-04-20 VITALS — BP 132/78 | HR 77 | Ht 70.0 in | Wt 259.0 lb

## 2024-04-20 DIAGNOSIS — I4819 Other persistent atrial fibrillation: Secondary | ICD-10-CM | POA: Diagnosis not present

## 2024-04-20 DIAGNOSIS — D6869 Other thrombophilia: Secondary | ICD-10-CM | POA: Diagnosis not present

## 2024-04-20 MED ORDER — APIXABAN 5 MG PO TABS: 5.0000 mg | ORAL_TABLET | Freq: Two times a day (BID) | ORAL | 6 refills | Status: AC

## 2024-04-20 MED ORDER — METOPROLOL TARTRATE 50 MG PO TABS: 50.0000 mg | ORAL_TABLET | Freq: Two times a day (BID) | ORAL | 6 refills | Status: AC

## 2024-04-20 NOTE — Progress Notes (Addendum)
 Primary Care Physician: Bulah Alm RAMAN, PA-C Referring Physician:  Lathaniel Legate is a 73 y.o. male with a h/o CAD, CVA, DM II,atrial  flutter/fib with ablations in 2010/2016. Most recent history of DCCV in ED on 01/15/23. He continues on Eliquis  with a CHA2DS2VASc score of 7.  Follow up in Afib clinic, 10/21/23. He is currently in NSR. He has had no episodes of Afib since last office visit. He feels well overall and is trying to stay active. No missed doses of Eliquis .   Follow up in Afib clinic, 04/20/24. Patient appears to have overall very low Afib burden. No missed doses of Eliquis .   Today, he denies symptoms of palpitations, chest pain, shortness of breath, orthopnea, PND, lower extremity edema, dizziness, presyncope, syncope, or neurologic sequela. The patient is tolerating medications without difficulties and is otherwise without complaint today.   Past Medical History:  Diagnosis Date   Atrial flutter (HCC)    a. atypical atrial flutter.   Bell's palsy    affects left side of face   CAD (coronary artery disease)    a. 07/2009 Cath: LM nl, LAD nl, LCX nl w/ L->R collats, chronically occluded RCA s/p failed PCI;  b. 07/2012 MV: basal inf mild ischemia.   Central retinal artery occlusion, left eye    diagnosed in 2008   Cerebrovascular accident [facility])    a. 03/2008 secondary to cardioembolic event;  b. chronic coumadin .   Chronic diastolic CHF (congestive heart failure) (HCC)    a. 12/2008 Echo: nl EF.   Diabetes mellitus 11/14/2010   TYPE II   Hematuria 05/2013   Urology, Dr. Alfonzo   Hyperlipidemia    Hypertension    Hypogonadism, male 05/2013   Dr. Alfonzo, Urology   Morbid obesity [facility])    Obesity    Obstructive sleep apnea    wears CPAP   Persistent atrial fibrillation (HCC)    a. s/p afib ablation by [facility] 08/01/08   Recommendation refused by patient    multiple recommended vaccines refused over the years (flu/pneumovax)   Skin infection    chronic left leg herpetic    Past Surgical History:  Procedure Laterality Date   ATRIAL ABLATION SURGERY  08/01/08   CTI and PVI ablation by [facility]   COLONOSCOPY     2012 per patient   ELECTROPHYSIOLOGIC STUDY N/A 11/30/2014   Procedure: Atrial Fibrillation Ablation;  Surgeon: Lynwood Rakers, MD;  Location: [facility] INVASIVE CV LAB;  Service: Cardiovascular;  Laterality: N/A;   GAS INSERTION Left 06/11/2023   Procedure: INSERTION OF GAS;  Surgeon: Alvia Norleen BIRCH, MD;  Location: [facility] OR;  Service: Ophthalmology;  Laterality: Left;   LASER PHOTO ABLATION Left 06/11/2023   Procedure: LASER PHOTO ABLATION;  Surgeon: Alvia Norleen BIRCH, MD;  Location: [facility] OR;  Service: Ophthalmology;  Laterality: Left;   SCLERAL BUCKLE Left 06/11/2023   Procedure: SCLERAL BUCKLE;  Surgeon: Alvia Norleen BIRCH, MD;  Location: [facility] OR;  Service: Ophthalmology;  Laterality: Left;   TEE WITHOUT CARDIOVERSION N/A 11/30/2014   Procedure: TRANSESOPHAGEAL ECHOCARDIOGRAM (TEE);  Surgeon: Maude JAYSON Emmer, MD;  Location: [facility] ENDOSCOPY;  Service: Cardiovascular;  Laterality: N/A;    Current Outpatient Medications  Medication Sig Dispense Refill   Accu-Chek Softclix Lancets lancets Test 1-2 times daily 100 each 1   albuterol  (VENTOLIN  HFA) 108 (90 Base) MCG/ACT inhaler Inhale 1-2 puffs into the lungs every 6 (six) hours as needed for wheezing or shortness of breath. 9 g 0   amLODipine  (NORVASC ) 5  MG tablet Take 1 tablet by mouth once daily 90 tablet 0   apixaban  (ELIQUIS ) 5 MG TABS tablet Take 1 tablet by mouth twice daily 180 tablet 2   Ascorbic Acid (VITAMIN C PO) Take 1 tablet by mouth every morning.     atorvastatin  (LIPITOR) 80 MG tablet Take 1 tablet by mouth once daily 90 tablet 2   cyanocobalamin  (VITAMIN B12) 1000 MCG/ML injection Inject 1 mL (1,000 mcg total) into the muscle every 30 (thirty) days. 30 mL 11   Evolocumab  (REPATHA ) 140 MG/ML SOSY Inject 140 mg into the skin every 14 (fourteen) days. 2.1 mL 11   glucose blood (ACCU-CHEK GUIDE) test strip USE  STRIP  TO CHECK GLUCOSE ONCE TO TWICE DAILY 100 each 1   Insulin  Pen Needle (BD PEN NEEDLE NANO 2ND GEN) 32G X 4 MM MISC USE  NEEDLES AT BEDTIME 100 each 1   isosorbide  mononitrate (IMDUR ) 30 MG 24 hr tablet Take 1 tablet by mouth once daily 90 tablet 3   losartan  (COZAAR ) 100 MG tablet Take 1 tablet by mouth once daily 90 tablet 2   metoprolol  tartrate (LOPRESSOR ) 50 MG tablet TAKE 1 TABLET BY MOUTH TWICE DAILY (  TAKE  1  EXTRA  IF  HEART  RATE  IS  110  OR  HIGHER) 180 tablet 3   NIACIN PO Take 1 tablet by mouth daily.     omega-3 acid ethyl esters (LOVAZA ) 1 g capsule Take 2 capsules (2 g total) by mouth 2 (two) times daily. 360 capsule 3   SYRINGE/NEEDLE, DISP, 1 ML (BD ECLIPSE SYRINGE) 25G X 5/8 1 ML MISC Use as needed for b12 injections 50 each 5   testosterone  (ANDROGEL ) 50 MG/5GM (1%) GEL APPLY 2 PACKETS TOPICALLY ONTO THE SKIN ONCE DAILY     tirzepatide  (MOUNJARO ) 15 MG/0.5ML Pen Inject 15 mg into the skin once a week. 6 mL 5   TRESIBA  FLEXTOUCH 100 UNIT/ML FlexTouch Pen Inject 18 Units into the skin daily. 15 mL 5   valACYclovir  (VALTREX ) 500 MG tablet Take 1 tablet by mouth once daily 90 tablet 0   VITAMIN D  PO Take 1 tablet by mouth daily.     No current facility-administered medications for this visit.    Allergies  Allergen Reactions   Crestor [Rosuvastatin] Other (See Comments)    Made him feel crazy   Zetia  [Ezetimibe ] Other (See Comments)    Made him feel crazy   Lisinopril  Cough   Farxiga  [Dapagliflozin ] Other (See Comments)    Made heart race   Metformin  And Related Other (See Comments)    Mental fog    ROS- All systems are reviewed and negative except as per the HPI above  Physical Exam: There were no vitals filed for this visit.   Wt Readings from Last 3 Encounters:  04/03/24 117.9 kg  02/04/24 118.4 kg  10/21/23 123.1 kg    Labs: Lab Results  Component Value Date   NA 139 02/04/2024   K 4.5 02/04/2024   CL 105 02/04/2024   CO2 19 (L) 02/04/2024    GLUCOSE 96 02/04/2024   BUN 15 02/04/2024   CREATININE 0.86 02/04/2024   CALCIUM  9.0 02/04/2024   MG 2.1 11/30/2020   Lab Results  Component Value Date   INR 1.3 (H) 01/15/2023   Lab Results  Component Value Date   CHOL 96 (L) 02/04/2024   HDL 38 (L) 02/04/2024   LDLCALC 42 02/04/2024   TRIG 76 02/04/2024  GEN- The patient is well appearing, alert and oriented x 3 today.   Neck - no JVD or carotid bruit noted Lungs- Clear to ausculation bilaterally, normal work of breathing Heart- Regular rate and rhythm, no murmurs, rubs or gallops, PMI not laterally displaced Extremities- no clubbing, cyanosis, or edema Skin - no rash or ecchymosis noted   EKG: Vent. rate 77 BPM PR interval 200 ms QRS duration 78 ms QT/QTcB 366/414 ms P-R-T axes 69 -7 11 Normal sinus rhythm Inferior infarct , age undetermined Anterior infarct (cited on or before 21-Oct-2023) Abnormal ECG When compared with ECG of 21-Oct-2023 10:35, Previous ECG is present  Echo 11/18/2014-   - Left ventricle: The cavity size was normal. There was moderate    focal basal hypertrophy of the septum. Systolic function was    normal. The estimated ejection fraction was in the range of 60%    to 65%. Wall motion was normal; there were no regional wall    motion abnormalities. Doppler parameters are consistent with    pseudonormal left ventricular relaxation (grade 2 diastolic    dysfunction). The E/A ratio is 1.8, the E/e&' ratio is >15,   suggesting elevated LV filling pressure.  - Aortic valve: Trileaflet; mildly calcified leaflets.  - Mitral valve: Calcified annulus. There was trivial regurgitation.  - Left atrium: The atrium was at the upper limits of normal in    size.  - Tricuspid valve: There was moderate regurgitation.  - Pulmonary arteries: PA peak pressure: 48 mm Hg (S).  - Inferior vena cava: The vessel was normal in size. The    respirophasic diameter changes were in the normal range (= 50%),     consistent with normal central venous pressure.   Assessment and Plan: 1. Afib/flutter  Afib ablation in 2016. S/p DCCV in ED on 01/15/23 with successful conversion to NSR.  Patient is currently in NSR. Continue metoprolol  50 mg BID. Labs in July stable in terms of creatinine, potassium, Hgb, and PLT.   We discussed risk factor modification by walking more to help increase exercise / promote weight loss. We also discussed reduction of caffeine intake because he can sometimes drink 1-2 pots of coffee daily. Patient notes he has not seen his cardiologist in a while. Will help him establish with new cardiologist.    2. CHA2DS2VASc score of 7 Continue Eliquis  5 mg BID.   3. OSA He uses CPAP regularly.   Will help establish with new cardiologist in 6 months, then 6 months later with  Afib clinic.   Fairy Heinrich, PA-C Afib Clinic [facility] [address] [place], [place] [address] [phone]

## 2024-05-12 DIAGNOSIS — G4733 Obstructive sleep apnea (adult) (pediatric): Secondary | ICD-10-CM | POA: Diagnosis not present

## 2024-05-12 DIAGNOSIS — I10 Essential (primary) hypertension: Secondary | ICD-10-CM | POA: Diagnosis not present

## 2024-05-19 DIAGNOSIS — H348122 Central retinal vein occlusion, left eye, stable: Secondary | ICD-10-CM | POA: Diagnosis not present

## 2024-05-19 DIAGNOSIS — H25013 Cortical age-related cataract, bilateral: Secondary | ICD-10-CM | POA: Diagnosis not present

## 2024-05-19 DIAGNOSIS — H18413 Arcus senilis, bilateral: Secondary | ICD-10-CM | POA: Diagnosis not present

## 2024-05-19 DIAGNOSIS — H2513 Age-related nuclear cataract, bilateral: Secondary | ICD-10-CM | POA: Diagnosis not present

## 2024-05-19 DIAGNOSIS — H2511 Age-related nuclear cataract, right eye: Secondary | ICD-10-CM | POA: Diagnosis not present

## 2024-05-26 ENCOUNTER — Other Ambulatory Visit: Payer: Self-pay | Admitting: Medical

## 2024-06-30 ENCOUNTER — Other Ambulatory Visit: Payer: Self-pay | Admitting: Cardiology

## 2024-07-14 ENCOUNTER — Ambulatory Visit: Payer: Self-pay

## 2024-07-14 VITALS — BP 138/70 | HR 88 | Temp 97.6°F | Ht 70.0 in | Wt 258.4 lb

## 2024-07-14 DIAGNOSIS — Z0000 Encounter for general adult medical examination without abnormal findings: Secondary | ICD-10-CM | POA: Diagnosis not present

## 2024-07-14 NOTE — Patient Instructions (Addendum)
 [name],  Thank you for taking the time for your Medicare Wellness Visit. I appreciate your continued commitment to your health goals. Please review the care plan we discussed, and feel free to reach out if I can assist you further.  Please note that Annual Wellness Visits do not include a physical exam. Some assessments may be limited, especially if the visit was conducted virtually. If needed, we may recommend an in-person follow-up with your provider.  Ongoing Care Seeing your primary care provider every 3 to 6 months helps us  monitor your health and provide consistent, personalized care.   Referrals If a referral was made during today's visit and you haven't received any updates within two weeks, please contact the referred provider directly to check on the status.  Recommended Screenings:  Health Maintenance  Topic Date Due   Flu Shot  Never done   Hemoglobin A1C  08/06/2024   Yearly kidney function blood test for diabetes  02/03/2025   Yearly kidney health urinalysis for diabetes  02/03/2025   Complete foot exam   02/03/2025   Eye exam for diabetics  03/19/2025   Medicare Annual Wellness Visit  07/14/2025   Colon Cancer Screening  11/10/2028   Meningitis B Vaccine  Aged Out   DTaP/Tdap/Td vaccine  Discontinued   Pneumococcal Vaccine for age over 5  Discontinued   COVID-19 Vaccine  Discontinued   Hepatitis C Screening  Discontinued   Zoster (Shingles) Vaccine  Discontinued       07/14/2024    2:04 PM  Advanced Directives  Does Patient Have a Medical Advance Directive? No  Would patient like information on creating a medical advance directive? No - Patient declined    Vision: Annual vision screenings are recommended for early detection of glaucoma, cataracts, and diabetic retinopathy. These exams can also reveal signs of chronic conditions such as diabetes and high blood pressure.  Dental: Annual dental screenings help detect early signs of oral cancer, gum disease,  and other conditions linked to overall health, including heart disease and diabetes.  Please see the attached documents for additional preventive care recommendations.

## 2024-07-14 NOTE — Progress Notes (Signed)
 "  Chief Complaint  Patient presents with   Medicare Wellness     Subjective:   [name] is a 73 y.o. male who presents for a Medicare Annual Wellness Visit.  Visit info / Clinical Intake: Medicare Wellness Visit Type:: Subsequent Annual Wellness Visit Persons participating in visit and providing information:: patient Medicare Wellness Visit Mode:: In-person (required for WTM) Interpreter Needed?: No Pre-visit prep was completed: yes AWV questionnaire completed by patient prior to visit?: no Living arrangements:: lives with spouse/significant other Patient's Overall Health Status Rating: excellent Typical amount of pain: none Does pain affect daily life?: no Are you currently prescribed opioids?: no  Dietary Habits and Nutritional Risks How many meals a day?: 2 Eats fruit and vegetables daily?: yes Most meals are obtained by: preparing own meals; eating out; having others provide food In the last 2 weeks, have you had any of the following?: none Diabetic:: (!) yes Any non-healing wounds?: no How often do you check your BS?: 0 Would you like to be referred to a Nutritionist or for Diabetic Management? : no  Functional Status Activities of Daily Living (to include ambulation/medication): Independent Ambulation: Independent Medication Administration: Independent Home Management (perform basic housework or laundry): Independent Manage your own finances?: yes Primary transportation is: driving Concerns about vision?: no *vision screening is required for WTM* Concerns about hearing?: no  Fall Screening Falls in the past year?: 0 Number of falls in past year: 0 Was there an injury with Fall?: 0 Fall Risk Category Calculator: 0 Patient Fall Risk Level: Low Fall Risk  Fall Risk Patient at Risk for Falls Due to: Medication side effect Fall risk Follow up: Falls prevention discussed; Falls evaluation completed  Home and Transportation Safety: All rugs have non-skid  backing?: N/A, no rugs All stairs or steps have railings?: (!) no Grab bars in the bathtub or shower?: (!) no Have non-skid surface in bathtub or shower?: yes Good home lighting?: yes Regular seat belt use?: yes Hospital stays in the last year:: no  Cognitive Assessment Difficulty concentrating, remembering, or making decisions? : no Will 6CIT or Mini Cog be Completed: yes What year is it?: 0 points What month is it?: 0 points Give patient an address phrase to remember (5 components): [address] About what time is it?: 0 points Count backwards from 20 to 1: 0 points Say the months of the year in reverse: 2 points Repeat the address phrase from earlier: 0 points 6 CIT Score: 2 points  Advance Directives (For Healthcare) Does Patient Have a Medical Advance Directive?: No Would patient like information on creating a medical advance directive?: No - Patient declined  Reviewed/Updated  Reviewed/Updated: Reviewed All (Medical, Surgical, Family, Medications, Allergies, Care Teams, Patient Goals)    Allergies (verified) Crestor [rosuvastatin], Zetia  [ezetimibe ], Lisinopril , Farxiga  [dapagliflozin ], and Metformin  and related   Current Medications (verified) Outpatient Encounter Medications as of 07/14/2024  Medication Sig   Accu-Chek Softclix Lancets lancets Test 1-2 times daily   albuterol  (VENTOLIN  HFA) 108 (90 Base) MCG/ACT inhaler Inhale 1-2 puffs into the lungs every 6 (six) hours as needed for wheezing or shortness of breath.   amLODipine  (NORVASC ) 5 MG tablet Take 1 tablet by mouth once daily   apixaban  (ELIQUIS ) 5 MG TABS tablet Take 1 tablet (5 mg total) by mouth 2 (two) times daily.   Ascorbic Acid (VITAMIN C PO) Take 1 tablet by mouth every morning.   atorvastatin  (LIPITOR) 80 MG tablet Take 1 tablet (80 mg total) by  mouth daily. Please call and schedule an appointment with our office to receive further refills. [phone] Thank you.   cyanocobalamin  (VITAMIN  B12) 1000 MCG/ML injection Inject 1 mL (1,000 mcg total) into the muscle every 30 (thirty) days.   Evolocumab  (REPATHA ) 140 MG/ML SOSY Inject 140 mg into the skin every 14 (fourteen) days.   glucose blood (ACCU-CHEK GUIDE) test strip USE  STRIP TO CHECK GLUCOSE ONCE TO TWICE DAILY   Insulin  Pen Needle (BD PEN NEEDLE NANO 2ND GEN) 32G X 4 MM MISC USE  NEEDLES AT BEDTIME   isosorbide  mononitrate (IMDUR ) 30 MG 24 hr tablet Take 1 tablet by mouth once daily   losartan  (COZAAR ) 100 MG tablet Take 1 tablet (100 mg total) by mouth daily. Please call and schedule an appointment with our office to receive further refills. [phone] Thank you.   metoprolol  tartrate (LOPRESSOR ) 50 MG tablet Take 1 tablet (50 mg total) by mouth 2 (two) times daily.   NIACIN PO Take 1 tablet by mouth daily.   omega-3 acid ethyl esters (LOVAZA ) 1 g capsule Take 2 capsules (2 g total) by mouth 2 (two) times daily.   SYRINGE/NEEDLE, DISP, 1 ML (BD ECLIPSE SYRINGE) 25G X 5/8 1 ML MISC Use as needed for b12 injections   testosterone  (ANDROGEL ) 50 MG/5GM (1%) GEL APPLY 2 PACKETS TOPICALLY ONTO THE SKIN ONCE DAILY   tirzepatide  (MOUNJARO ) 15 MG/0.5ML Pen Inject 15 mg into the skin once a week.   TRESIBA  FLEXTOUCH 100 UNIT/ML FlexTouch Pen Inject 18 Units into the skin daily.   valACYclovir  (VALTREX ) 500 MG tablet Take 1 tablet by mouth once daily   VITAMIN D  PO Take 1 tablet by mouth daily.   No facility-administered encounter medications on file as of 07/14/2024.    History: Past Medical History:  Diagnosis Date   Atrial flutter (HCC)    a. atypical atrial flutter.   Bell's palsy    affects left side of face   CAD (coronary artery disease)    a. 07/2009 Cath: LM nl, LAD nl, LCX nl w/ L->R collats, chronically occluded RCA s/p failed PCI;  b. 07/2012 MV: basal inf mild ischemia.   Central retinal artery occlusion, left eye    diagnosed in 2008   Cerebrovascular accident [facility])    a. 03/2008 secondary to cardioembolic  event;  b. chronic coumadin .   Chronic diastolic CHF (congestive heart failure) (HCC)    a. 12/2008 Echo: nl EF.   Diabetes mellitus 11/14/2010   TYPE II   Hematuria 05/2013   Urology, Dr. Alfonzo   Hyperlipidemia    Hypertension    Hypogonadism, male 05/2013   Dr. Alfonzo, Urology   Morbid obesity [facility])    Obesity    Obstructive sleep apnea    wears CPAP   Persistent atrial fibrillation (HCC)    a. s/p afib ablation by [facility] 08/01/08   Recommendation refused by patient    multiple recommended vaccines refused over the years (flu/pneumovax)   Skin infection    chronic left leg herpetic   Past Surgical History:  Procedure Laterality Date   ATRIAL ABLATION SURGERY  08/01/2008   CTI and PVI [facility] by [facility]   CATARACT EXTRACTION Bilateral    November and December 2025, Dr. Lavonia   COLONOSCOPY     2012 per patient   ELECTROPHYSIOLOGIC STUDY N/A 11/30/2014   Procedure: Atrial Fibrillation Ablation;  Surgeon: Lynwood Rakers, MD;  Location: [facility] INVASIVE CV LAB;  Service: Cardiovascular;  Laterality: N/A;  GAS INSERTION Left 06/11/2023   Procedure: INSERTION OF GAS;  Surgeon: Alvia Norleen BIRCH, MD;  Location: [facility] OR;  Service: Ophthalmology;  Laterality: Left;   LASER PHOTO ABLATION Left 06/11/2023   Procedure: LASER PHOTO ABLATION;  Surgeon: Alvia Norleen BIRCH, MD;  Location: [facility] OR;  Service: Ophthalmology;  Laterality: Left;   SCLERAL BUCKLE Left 06/11/2023   Procedure: SCLERAL BUCKLE;  Surgeon: Alvia Norleen BIRCH, MD;  Location: [facility] OR;  Service: Ophthalmology;  Laterality: Left;   TEE WITHOUT CARDIOVERSION N/A 11/30/2014   Procedure: TRANSESOPHAGEAL ECHOCARDIOGRAM (TEE);  Surgeon: Maude JAYSON Emmer, MD;  Location: [facility] ENDOSCOPY;  Service: Cardiovascular;  Laterality: N/A;   Family History  Problem Relation Age of Onset   Stroke Father    Heart disease Father    Heart disease Mother    Social History   Occupational History   Not on file  Tobacco Use   Smoking status: Former    Current  packs/day: 0.00    Types: Cigarettes    Quit date: 07/24/1979    Years since quitting: 45.0   Smokeless tobacco: Never   Tobacco comments:    Former smoker 04/25/23  Vaping Use   Vaping status: Never Used  Substance and Sexual Activity   Alcohol use: Not Currently   Drug use: No   Sexual activity: Not on file   Tobacco Counseling Counseling given: Not Answered Tobacco comments: Former smoker 04/25/23  SDOH Screenings   Food Insecurity: No Food Insecurity (07/14/2024)  Housing: Unknown (07/14/2024)  Transportation Needs: No Transportation Needs (07/14/2024)  Utilities: Not At Risk (07/14/2024)  Alcohol Screen: Low Risk (07/14/2024)  Depression (PHQ2-9): Low Risk (07/14/2024)  Financial Resource Strain: Low Risk (07/14/2024)  Physical Activity: Inactive (07/14/2024)  Social Connections: Moderately Integrated (07/14/2024)  Stress: No Stress Concern Present (07/14/2024)  Tobacco Use: Medium Risk (07/14/2024)  Health Literacy: Adequate Health Literacy (07/14/2024)   See flowsheets for full screening details  Depression Screen PHQ 2 & 9 Depression Scale- Over the past 2 weeks, how often have you been bothered by any of the following problems? Little interest or pleasure in doing things: 0 Feeling down, depressed, or hopeless (PHQ Adolescent also includes...irritable): 0 PHQ-2 Total Score: 0 Trouble falling or staying asleep, or sleeping too much: 0 Feeling tired or having little energy: 1 Poor appetite or overeating (PHQ Adolescent also includes...weight loss): 0 Feeling bad about yourself - or that you are a failure or have let yourself or your family down: 0 Trouble concentrating on things, such as reading the newspaper or watching television (PHQ Adolescent also includes...like school work): 0 Moving or speaking so slowly that other people could have noticed. Or the opposite - being so fidgety or restless that you have been moving around a lot more than usual: 0 Thoughts that  you would be better off dead, or of hurting yourself in some way: 0 PHQ-9 Total Score: 1 If you checked off any problems, how difficult have these problems made it for you to do your work, take care of things at home, or get along with other people?: Not difficult at all  Depression Treatment Depression Interventions/Treatment : EYV7-0 Score <4 Follow-up Not Indicated     Goals Addressed             This Visit's Progress    Patient Stated       07/14/2024, stay alive             Objective:    Today's Vitals   07/14/24  1357  BP: 138/70  Pulse: 88  Temp: 97.6 F (36.4 C)  TempSrc: Oral  SpO2: 94%  Weight: 258 lb 6.4 oz (117.2 kg)  Height: 5' 10 (1.778 m)   Body mass index is 37.08 kg/m.  Hearing/Vision screen Hearing Screening - Comments:: Denies hearing issues Vision Screening - Comments:: Regular eye exams Immunizations and Health Maintenance Health Maintenance  Topic Date Due   Influenza Vaccine  Never done   HEMOGLOBIN A1C  08/06/2024   Diabetic kidney evaluation - eGFR measurement  02/03/2025   Diabetic kidney evaluation - Urine ACR  02/03/2025   FOOT EXAM  02/03/2025   OPHTHALMOLOGY EXAM  03/19/2025   Medicare Annual Wellness (AWV)  07/14/2025   Colonoscopy  11/10/2028   Meningococcal B Vaccine  Aged Out   DTaP/Tdap/Td  Discontinued   Pneumococcal Vaccine: 50+ Years  Discontinued   COVID-19 Vaccine  Discontinued   Hepatitis C Screening  Discontinued   Zoster Vaccines- Shingrix  Discontinued        Assessment/Plan:  This is a routine wellness examination for [name].  Patient Care Team: Tysinger, Alm RAMAN, PA-C as PCP - General (Family Medicine) Alvia Norleen BIRCH, MD as Consulting Physician (Ophthalmology) Terra Fairy PARAS, PA-C as Physician Assistant (Cardiology)  I have personally reviewed and noted the following in the patients chart:   Medical and social history Use of alcohol, tobacco or illicit drugs  Current medications and  supplements including opioid prescriptions. Functional ability and status Nutritional status Physical activity Advanced directives List of other physicians Hospitalizations, surgeries, and ER visits in previous 12 months Vitals Screenings to include cognitive, depression, and falls Referrals and appointments  No orders of the defined types were placed in this encounter.  In addition, I have reviewed and discussed with patient certain preventive protocols, quality metrics, and best practice recommendations. A written personalized care plan for preventive services as well as general preventive health recommendations were provided to patient.   Ardella FORBES Dawn, LPN   [id number]   Return in 1 year (on 07/14/2025).  After Visit Summary: (In Person-Printed) AVS printed and given to the patient  Nurse Notes: Vaccines not given: flu declined today  "

## 2024-08-06 ENCOUNTER — Other Ambulatory Visit: Payer: Self-pay | Admitting: Medical

## 2024-08-10 ENCOUNTER — Other Ambulatory Visit: Payer: Self-pay | Admitting: Medical

## 2024-08-10 NOTE — Telephone Encounter (Signed)
"  Pt has upcoming appt.  "

## 2024-08-11 ENCOUNTER — Ambulatory Visit: Admitting: Medical

## 2024-08-11 ENCOUNTER — Other Ambulatory Visit: Payer: Self-pay | Admitting: General Practice

## 2024-08-11 VITALS — BP 120/64 | HR 82 | Ht 70.0 in | Wt 256.5 lb

## 2024-08-11 DIAGNOSIS — I10 Essential (primary) hypertension: Secondary | ICD-10-CM

## 2024-08-11 DIAGNOSIS — E785 Hyperlipidemia, unspecified: Secondary | ICD-10-CM | POA: Diagnosis not present

## 2024-08-11 DIAGNOSIS — I2510 Atherosclerotic heart disease of native coronary artery without angina pectoris: Secondary | ICD-10-CM | POA: Diagnosis not present

## 2024-08-11 DIAGNOSIS — G4733 Obstructive sleep apnea (adult) (pediatric): Secondary | ICD-10-CM

## 2024-08-11 DIAGNOSIS — I2583 Coronary atherosclerosis due to lipid rich plaque: Secondary | ICD-10-CM

## 2024-08-11 DIAGNOSIS — I5032 Chronic diastolic (congestive) heart failure: Secondary | ICD-10-CM

## 2024-08-11 DIAGNOSIS — R252 Cramp and spasm: Secondary | ICD-10-CM | POA: Diagnosis not present

## 2024-08-11 DIAGNOSIS — Z8679 Personal history of other diseases of the circulatory system: Secondary | ICD-10-CM | POA: Diagnosis not present

## 2024-08-11 DIAGNOSIS — E782 Mixed hyperlipidemia: Secondary | ICD-10-CM | POA: Diagnosis not present

## 2024-08-11 DIAGNOSIS — Z8669 Personal history of other diseases of the nervous system and sense organs: Secondary | ICD-10-CM

## 2024-08-11 DIAGNOSIS — Z7901 Long term (current) use of anticoagulants: Secondary | ICD-10-CM | POA: Diagnosis not present

## 2024-08-11 DIAGNOSIS — E1169 Type 2 diabetes mellitus with other specified complication: Secondary | ICD-10-CM

## 2024-08-11 DIAGNOSIS — E118 Type 2 diabetes mellitus with unspecified complications: Secondary | ICD-10-CM

## 2024-08-11 MED ORDER — BLOOD GLUCOSE TEST VI STRP: ORAL_STRIP | 0 refills | Status: AC

## 2024-08-11 MED ORDER — LANCETS MISC: 1.0000 | 0 refills | Status: AC

## 2024-08-11 MED ORDER — LANCET DEVICE MISC: 0 refills | Status: AC

## 2024-08-11 NOTE — Progress Notes (Signed)
 "  Name: [name]   Date of Visit: 08/12/24   Date of last visit with me: 08/10/2024   CHIEF COMPLAINT:  Chief Complaint  Patient presents with   Medical Management of Chronic Issues    6 month med check. Getting cramps in feet       HPI:  Discussed the use of AI scribe software for clinical note transcription with the patient, who gave verbal consent to proceed.  History of Present Illness   [name] is a 74 year old male with diabetes and hypertension who presents with cramps in his feet.  He experiences cramps in his feet, describing them as feeling like they want to 'twist and turn.' He experiences cramps in his feet and legs, describing them as feeling like they want to 'twist and turn.' He takes magnesium  daily and acknowledges not drinking enough water , which he admits could be contributing to his cramps. His mother had similar symptoms and was bedridden for several years towards the end of her life.  He is on multiple medications for his chronic conditions. For blood pressure, he takes amlodipine  5 mg, metoprolol  50 mg twice a day, and losartan  100 mg daily. For cholesterol management, he is on Lipitor 80 mg daily, over-the-counter niacin, Lovaza  prescription fish oil  two tablets twice a day, and Repatha  injections every two weeks. For diabetes, he uses 18 units of Tresiba  and is also on Mounjaro .  He has not had strips for checking his blood sugar since last month. He typically checks his blood sugar once a day.   He has undergone cataract surgery and mentions that his vision is still blurry due to blood in his eye, but he can see well enough to navigate his home.  No other aggravating or relieving factors. No other complaint.    Past Medical History:  Diagnosis Date   Atrial flutter (HCC)    a. atypical atrial flutter.   Bell's palsy    affects left side of face   CAD (coronary artery disease)    a. 07/2009 Cath: LM nl, LAD nl, LCX nl w/ L->R collats,  chronically occluded RCA s/p failed PCI;  b. 07/2012 MV: basal inf mild ischemia.   Central retinal artery occlusion, left eye    diagnosed in 2008   Cerebrovascular accident [facility])    a. 03/2008 secondary to cardioembolic event;  b. chronic coumadin .   Chronic diastolic CHF (congestive heart failure) (HCC)    a. 12/2008 Echo: nl EF.   Diabetes mellitus 11/14/2010   TYPE II   Hematuria 05/2013   Urology, Dr. Alfonzo   Hyperlipidemia    Hypertension    Hypogonadism, male 05/2013   Dr. Alfonzo, Urology   Morbid obesity [facility])    Obesity    Obstructive sleep apnea    wears CPAP   Persistent atrial fibrillation [facility])    a. s/p afib ablation by [facility] 08/01/08   Recommendation refused by patient    multiple recommended vaccines refused over the years (flu/pneumovax)   Skin infection    chronic left leg herpetic    Current Outpatient Medications on File Prior to Visit  Medication Sig Dispense Refill   albuterol  (VENTOLIN  HFA) 108 (90 Base) MCG/ACT inhaler INHALE 2 PUFFS BY MOUTH EVERY 6 HOURS AS NEEDED FOR WHEEZING FOR SHORTNESS OF BREATH 9 g 0   amLODipine  (NORVASC ) 5 MG tablet Take 1 tablet by mouth once daily 90 tablet 0   apixaban  (ELIQUIS ) 5 MG TABS tablet Take 1 tablet (5 mg  total) by mouth 2 (two) times daily. 60 tablet 6   Ascorbic Acid (VITAMIN C PO) Take 1 tablet by mouth every morning.     atorvastatin  (LIPITOR) 80 MG tablet Take 1 tablet (80 mg total) by mouth daily. Please call and schedule an appointment with our office to receive further refills. [phone] Thank you. 30 tablet 0   cyanocobalamin  (VITAMIN B12) 1000 MCG/ML injection Inject 1 mL (1,000 mcg total) into the muscle every 30 (thirty) days. 30 mL 11   isosorbide  mononitrate (IMDUR ) 30 MG 24 hr tablet Take 1 tablet by mouth once daily 90 tablet 3   losartan  (COZAAR ) 100 MG tablet Take 1 tablet (100 mg total) by mouth daily. Please call and schedule an appointment with our office to receive further refills. [phone] Thank  you. 30 tablet 0   metoprolol  tartrate (LOPRESSOR ) 50 MG tablet Take 1 tablet (50 mg total) by mouth 2 (two) times daily. 60 tablet 6   NIACIN PO Take 1 tablet by mouth daily.     omega-3 acid ethyl esters (LOVAZA ) 1 g capsule Take 2 capsules (2 g total) by mouth 2 (two) times daily. 360 capsule 3   REPATHA  SURECLICK 140 MG/ML SOAJ INJECT 140 MG INTO THE SKIN EVERY 14 DAYS 2 mL 0   tirzepatide  (MOUNJARO ) 15 MG/0.5ML Pen Inject 15 mg into the skin once a week. 6 mL 5   TRESIBA  FLEXTOUCH 100 UNIT/ML FlexTouch Pen Inject 18 Units into the skin daily. 15 mL 5   valACYclovir  (VALTREX ) 500 MG tablet Take 1 tablet by mouth once daily 90 tablet 0   VITAMIN D  PO Take 1 tablet by mouth daily.     Insulin  Pen Needle (BD PEN NEEDLE NANO 2ND GEN) 32G X 4 MM MISC USE  NEEDLES AT BEDTIME 100 each 1   SYRINGE/NEEDLE, DISP, 1 ML (BD ECLIPSE SYRINGE) 25G X 5/8 1 ML MISC Use as needed for b12 injections 50 each 5   testosterone  (ANDROGEL ) 50 MG/5GM (1%) GEL APPLY 2 PACKETS TOPICALLY ONTO THE SKIN ONCE DAILY (Patient not taking: Reported on 08/11/2024)     No current facility-administered medications on file prior to visit.    ROS as in subjective     Objective: BP 120/64   Pulse 82   Ht 5' 10 (1.778 m)   Wt 256 lb 8 oz (116.3 kg)   BMI 36.80 kg/m   Wt Readings from Last 3 Encounters:  08/11/24 256 lb 8 oz (116.3 kg)  07/14/24 258 lb 6.4 oz (117.2 kg)  04/20/24 259 lb (117.5 kg)   BP Readings from Last 3 Encounters:  08/11/24 120/64  07/14/24 138/70  04/20/24 132/78   General appearence: alert, no distress, WD/WN,  Neck: supple, no lymphadenopathy, no thyromegaly, no masses Heart: RRR, normal S1, S2, no murmurs Lungs: CTA bilaterally, no wheezes, rhonchi, or rales Pulses: 2+ symmetric, upper and lower extremities, normal cap refill Ext: no edema Legs nontender, no swelling, no asymmetry, no deformity    Assessment: Encounter Diagnoses  Name Primary?   Diabetes mellitus with  complication (HCC) Yes   Dyslipidemia associated with type 2 diabetes mellitus (HCC)    Coronary artery disease due to lipid rich plaque    Essential hypertension, benign    Muscle cramps    History of cataract    Anticoagulant long-term use    History of atrial fibrillation    Mixed dyslipidemia    OSA (obstructive sleep apnea)      Plan:  Type 2  diabetes mellitus with complications Diabetes managed with Tresiba  and Mounjaro . Blood sugar levels well-controlled, recent readings around 6.6. Prefers glucose strips over continuous monitoring. - Continue Tresiba  18u daily and Mounjaro  15mg  daily - Continue using glucose strips for monitoring.  Essential hypertension Blood pressure well-controlled with amlodipine  5mg  daily, metoprolol  50mg  BID, and losartan  100mg  daily - Continue amlodipine , metoprolol , and losartan .  Muscle cramps Intermittent leg cramps possibly due to dehydration, low potassium, or magnesium . Takes magnesium  supplements. Inadequate water  intake noted. - Order blood tests for magnesium  and potassium. - Increase water  intake. - Consider daily teaspoon of mustard or sugar-free tonic water  for relief.  CAD , hyperlipidemia - continue Lipitor 80mg  daily, niacin daily, lovaza  2 capsules BID, repatha  q 2 weeks  Hx/o afib -continue Eliquis  5mg  BID  OSA  -does well on CPAP and gets much benefit from use   Meredith was seen today for medical management of chronic issues.  Diagnoses and all orders for this visit:  Diabetes mellitus with complication (HCC) -     Glucose Blood (BLOOD GLUCOSE TEST STRIPS) STRP; Test 1-2 times daily -     Lancet Device MISC; 1-2 times daily -     Lancets MISC; 1 each by Does not apply route as directed. Dispense based on patient and insurance preference. Use up to four times daily as directed. (FOR ICD-10 E10.9, E11.9). -     Hemoglobin A1c  Dyslipidemia associated with type 2 diabetes mellitus (HCC)  Coronary artery disease due to  lipid rich plaque  Essential hypertension, benign -     Basic metabolic panel with GFR -     Magnesium   Muscle cramps -     CBC -     Basic metabolic panel with GFR -     Magnesium   History of cataract  Anticoagulant long-term use  History of atrial fibrillation  Mixed dyslipidemia  OSA (obstructive sleep apnea)    F/u 47mo          "

## 2024-08-12 ENCOUNTER — Ambulatory Visit: Payer: Self-pay | Admitting: Medical

## 2024-08-12 ENCOUNTER — Other Ambulatory Visit: Payer: Self-pay | Admitting: Medical

## 2024-08-12 LAB — BASIC METABOLIC PANEL WITH GFR
BUN/Creatinine Ratio: 20 (ref 10–24)
BUN: 23 mg/dL (ref 8–27)
CO2: 20 mmol/L (ref 20–29)
Calcium: 9.4 mg/dL (ref 8.6–10.2)
Chloride: 100 mmol/L (ref 96–106)
Creatinine, Ser: 1.13 mg/dL (ref 0.76–1.27)
Glucose: 117 mg/dL — AB (ref 70–99)
Potassium: 5 mmol/L (ref 3.5–5.2)
Sodium: 139 mmol/L (ref 134–144)
eGFR: 69 mL/min/1.73

## 2024-08-12 LAB — CBC
Hematocrit: 48.3 % (ref 37.5–51.0)
Hemoglobin: 15.7 g/dL (ref 13.0–17.7)
MCH: 29.9 pg (ref 26.6–33.0)
MCHC: 32.5 g/dL (ref 31.5–35.7)
MCV: 92 fL (ref 79–97)
Platelets: 239 x10E3/uL (ref 150–450)
RBC: 5.25 x10E6/uL (ref 4.14–5.80)
RDW: 15.8 % — ABNORMAL HIGH (ref 11.6–15.4)
WBC: 7.1 x10E3/uL (ref 3.4–10.8)

## 2024-08-12 LAB — HEMOGLOBIN A1C
Est. average glucose Bld gHb Est-mCnc: 140 mg/dL
Hgb A1c MFr Bld: 6.5 % — AB (ref 4.8–5.6)

## 2024-08-12 LAB — MAGNESIUM: Magnesium: 2.2 mg/dL (ref 1.6–2.3)

## 2024-08-12 MED ORDER — AMLODIPINE BESYLATE 5 MG PO TABS: 5.0000 mg | ORAL_TABLET | Freq: Every day | ORAL | 3 refills | Status: AC

## 2024-08-12 MED ORDER — BD PEN NEEDLE NANO 2ND GEN 32G X 4 MM MISC: 5 refills | Status: AC

## 2024-08-12 MED ORDER — REPATHA SURECLICK 140 MG/ML ~~LOC~~ SOAJ: 140.0000 mg | SUBCUTANEOUS | 11 refills | Status: AC

## 2024-08-12 MED ORDER — TRESIBA FLEXTOUCH 100 UNIT/ML ~~LOC~~ SOPN: 18.0000 [IU] | PEN_INJECTOR | Freq: Every day | SUBCUTANEOUS | 5 refills | Status: AC

## 2024-08-12 MED ORDER — VALACYCLOVIR HCL 500 MG PO TABS: 500.0000 mg | ORAL_TABLET | Freq: Every day | ORAL | 2 refills | Status: AC

## 2024-08-12 MED ORDER — TIRZEPATIDE 15 MG/0.5ML ~~LOC~~ SOAJ: 15.0000 mg | SUBCUTANEOUS | 5 refills | Status: AC

## 2024-08-12 MED ORDER — OMEGA-3-ACID ETHYL ESTERS 1 G PO CAPS: 2.0000 | ORAL_CAPSULE | Freq: Two times a day (BID) | ORAL | 3 refills | Status: AC

## 2024-08-12 NOTE — Progress Notes (Signed)
 Results through MyChart

## 2024-08-13 NOTE — Telephone Encounter (Signed)
 Please call and schedule an appointment with our office to receive further refills. Last attempt [phone] Thank you.

## 2024-08-18 ENCOUNTER — Other Ambulatory Visit: Payer: Self-pay | Admitting: Cardiology

## 2024-08-25 ENCOUNTER — Other Ambulatory Visit (HOSPITAL_COMMUNITY): Payer: Self-pay

## 2024-08-26 ENCOUNTER — Other Ambulatory Visit: Payer: Self-pay | Admitting: Medical

## 2024-09-17 ENCOUNTER — Encounter (INDEPENDENT_AMBULATORY_CARE_PROVIDER_SITE_OTHER): Admitting: Ophthalmology

## 2025-02-11 ENCOUNTER — Encounter: Admitting: Medical

## 2025-07-27 ENCOUNTER — Ambulatory Visit
